# Patient Record
Sex: Female | Born: 1960
Health system: Southern US, Community
[De-identification: ages and names within clinical notes are randomized; demographics above are authoritative.]

## PROBLEM LIST (undated history)

## (undated) DIAGNOSIS — Z720 Tobacco use: Secondary | ICD-10-CM

## (undated) DIAGNOSIS — F329 Major depressive disorder, single episode, unspecified: Secondary | ICD-10-CM

## (undated) DIAGNOSIS — M51369 Other intervertebral disc degeneration, lumbar region without mention of lumbar back pain or lower extremity pain: Secondary | ICD-10-CM

## (undated) DIAGNOSIS — E079 Disorder of thyroid, unspecified: Secondary | ICD-10-CM

## (undated) DIAGNOSIS — F32A Depression, unspecified: Secondary | ICD-10-CM

## (undated) DIAGNOSIS — G4700 Insomnia, unspecified: Secondary | ICD-10-CM

## (undated) DIAGNOSIS — M797 Fibromyalgia: Secondary | ICD-10-CM

## (undated) DIAGNOSIS — M5136 Other intervertebral disc degeneration, lumbar region: Secondary | ICD-10-CM

## (undated) DIAGNOSIS — G43909 Migraine, unspecified, not intractable, without status migrainosus: Secondary | ICD-10-CM

## (undated) HISTORY — DX: Insomnia, unspecified: G47.00

## (undated) HISTORY — DX: Other intervertebral disc degeneration, lumbar region without mention of lumbar back pain or lower extremity pain: M51.369

## (undated) HISTORY — DX: Tobacco use: Z72.0

## (undated) HISTORY — DX: Disorder of thyroid, unspecified: E07.9

## (undated) HISTORY — DX: Other intervertebral disc degeneration, lumbar region: M51.36

## (undated) HISTORY — DX: Major depressive disorder, single episode, unspecified: F32.9

## (undated) HISTORY — DX: Fibromyalgia: M79.7

## (undated) HISTORY — DX: Depression, unspecified: F32.A

## (undated) HISTORY — DX: Migraine, unspecified, not intractable, without status migrainosus: G43.909

## (undated) HISTORY — PX: ABDOMINAL HYSTERECTOMY: SHX81

---

## 2009-12-29 ENCOUNTER — Ambulatory Visit: Payer: Self-pay | Admitting: Family Medicine

## 2009-12-29 DIAGNOSIS — IMO0002 Reserved for concepts with insufficient information to code with codable children: Secondary | ICD-10-CM | POA: Insufficient documentation

## 2009-12-29 DIAGNOSIS — M1710 Unilateral primary osteoarthritis, unspecified knee: Secondary | ICD-10-CM

## 2009-12-29 DIAGNOSIS — M5137 Other intervertebral disc degeneration, lumbosacral region: Secondary | ICD-10-CM | POA: Insufficient documentation

## 2009-12-29 DIAGNOSIS — N951 Menopausal and female climacteric states: Secondary | ICD-10-CM | POA: Insufficient documentation

## 2009-12-29 DIAGNOSIS — F4321 Adjustment disorder with depressed mood: Secondary | ICD-10-CM | POA: Insufficient documentation

## 2009-12-29 DIAGNOSIS — E039 Hypothyroidism, unspecified: Secondary | ICD-10-CM | POA: Insufficient documentation

## 2010-01-01 ENCOUNTER — Encounter: Payer: Self-pay | Admitting: Family Medicine

## 2010-01-01 ENCOUNTER — Telehealth: Payer: Self-pay | Admitting: Family Medicine

## 2010-01-01 LAB — CONVERTED CEMR LAB
LH: 63.6 milliintl units/mL
TSH: 6.307 microintl units/mL — ABNORMAL HIGH (ref 0.350–4.500)

## 2010-01-02 LAB — CONVERTED CEMR LAB: Free T4: 0.83 ng/dL (ref 0.80–1.80)

## 2010-01-18 ENCOUNTER — Telehealth (INDEPENDENT_AMBULATORY_CARE_PROVIDER_SITE_OTHER): Payer: Self-pay | Admitting: *Deleted

## 2010-02-06 ENCOUNTER — Ambulatory Visit: Payer: Self-pay | Admitting: Family Medicine

## 2010-02-06 DIAGNOSIS — F32A Depression, unspecified: Secondary | ICD-10-CM | POA: Insufficient documentation

## 2010-02-06 DIAGNOSIS — F329 Major depressive disorder, single episode, unspecified: Secondary | ICD-10-CM

## 2010-02-07 ENCOUNTER — Telehealth: Payer: Self-pay | Admitting: Family Medicine

## 2010-02-15 ENCOUNTER — Telehealth: Payer: Self-pay | Admitting: Family Medicine

## 2010-02-19 ENCOUNTER — Encounter: Payer: Self-pay | Admitting: Family Medicine

## 2010-05-02 ENCOUNTER — Ambulatory Visit: Payer: Self-pay | Admitting: Family Medicine

## 2010-05-02 DIAGNOSIS — R112 Nausea with vomiting, unspecified: Secondary | ICD-10-CM | POA: Insufficient documentation

## 2010-05-04 ENCOUNTER — Ambulatory Visit: Payer: Self-pay | Admitting: Family Medicine

## 2010-05-07 LAB — CONVERTED CEMR LAB
ALT: 13 units/L (ref 0–35)
AST: 14 units/L (ref 0–37)
Alkaline Phosphatase: 59 units/L (ref 39–117)
BUN: 10 mg/dL (ref 6–23)
Basophils Absolute: 0 10*3/uL (ref 0.0–0.1)
Basophils Relative: 0 % (ref 0–1)
Creatinine, Ser: 0.77 mg/dL (ref 0.40–1.20)
Eosinophils Absolute: 0.2 10*3/uL (ref 0.0–0.7)
HDL: 54 mg/dL (ref >39)
Hemoglobin: 15.3 g/dL — ABNORMAL HIGH (ref 12.0–15.0)
LDL Cholesterol: 128 mg/dL — ABNORMAL HIGH (ref 0–99)
Lipase: 22 units/L (ref 0–75)
MCHC: 34.5 g/dL (ref 30.0–36.0)
MCV: 93.5 fL (ref 78.0–100.0)
Monocytes Absolute: 0.7 10*3/uL (ref 0.1–1.0)
Monocytes Relative: 7 % (ref 3–12)
Neutro Abs: 5.9 10*3/uL (ref 1.7–7.7)
Neutrophils Relative %: 63 % (ref 43–77)
RBC: 4.74 M/uL (ref 3.87–5.11)
RDW: 13.2 % (ref 11.5–15.5)
TSH: 4.121 microintl units/mL (ref 0.350–4.500)
Total Bilirubin: 0.5 mg/dL (ref 0.3–1.2)
Total CHOL/HDL Ratio: 3.7
VLDL: 17 mg/dL (ref 0–40)

## 2010-05-14 ENCOUNTER — Telehealth (INDEPENDENT_AMBULATORY_CARE_PROVIDER_SITE_OTHER): Payer: Self-pay | Admitting: *Deleted

## 2010-05-30 ENCOUNTER — Telehealth: Payer: Self-pay | Admitting: Family Medicine

## 2010-06-13 ENCOUNTER — Ambulatory Visit: Payer: Self-pay | Admitting: Family Medicine

## 2010-06-13 DIAGNOSIS — F17200 Nicotine dependence, unspecified, uncomplicated: Secondary | ICD-10-CM | POA: Insufficient documentation

## 2010-06-27 ENCOUNTER — Telehealth: Payer: Self-pay | Admitting: Family Medicine

## 2010-10-30 NOTE — Letter (Signed)
Summary: [facility]  [facility]   Imported By: Lanelle Bal 03/05/2010 09:39:37  _____________________________________________________________________  External Attachment:    Type:   Image     Comment:   External Document

## 2010-10-30 NOTE — Progress Notes (Signed)
Summary: Knee hurting- wants injection  Phone Note Call from Patient Call back at Home Phone [phone]   Caller: Patient Call For: Seymour Bars DO Summary of Call: Pt calls and wants to know if it is to soon to get another injection in her left knee- states it is killing her Initial call taken by: Kathlene November LPN,  June 27, 2010 8:26 AM  Follow-up for Phone Call        It's only been 1 month since last injection, so too early to repeat.  I suggest she call Dr Shella Spearing for f/u with him ([facility]) Follow-up by: Seymour Bars DO,  June 27, 2010 9:24 AM  Additional Follow-up for Phone Call Additional follow up Details #1::        Pt notified of above instructions. She has called her ortho and awaiting a call back from them Additional Follow-up by: Kathlene November LPN,  June 27, 2010 9:31 AM

## 2010-10-30 NOTE — Progress Notes (Signed)
Summary: Feeling better  Phone Note Call from Patient   Caller: Patient Summary of Call: Pt called to thank you for making her feel better. Pt states since increasing thyroid dose she has felt like a new person and depression is better.  Initial call taken by: Payton Spark CMA,  May 14, 2010 11:03 AM

## 2010-10-30 NOTE — Progress Notes (Signed)
Summary: Knee pain referral  Phone Note Call from Patient   Caller: Patient Summary of Call: Pt would like referral to orthopaedic surgeon Dr. Randall An @ [facility] for her knees. Please advise Initial call taken by: Payton Spark CMA,  Feb 15, 2010 12:45 PM  Follow-up for Phone Call        done. Follow-up by: Seymour Bars DO,  Feb 15, 2010 1:03 PM

## 2010-10-30 NOTE — Assessment & Plan Note (Signed)
Summary: back and knee pain   Vital Signs:  Patient profile:   50 year old female Height:      70 inches Weight:      206 pounds BMI:     29.66 O2 Sat:      98 % on Room air Pulse rate:   99 / minute BP sitting:   120 / 70  (left arm) Cuff size:   large  Vitals Entered By: Payton Spark CMA (Feb 06, 2010 1:23 PM)  O2 Flow:  Room air CC: F/U back and joint pain.    Primary Care Provider:  Seymour Bars DO  CC:  F/U back and joint pain. Marland Kitchen  History of Present Illness: I injected her L knee with a steroid injection 4-1.  It did help short term but it is starting to hurt more again.  She has advanced DJD.  It hurts when she stands all day at her part time job at the [employer].  She still c/o having LBP that hurts all the time.  She had imaging done- MRI in the last year.  She has been seeing Dr Roderic Ovens for pain mgmt for injections which have helped but her pain is definitely worsened by working (standing).  She is back in school for medical coding.  She finished in June and thinks that her pain will improve once she has a more sedentary job.        Current Medications (verified): 1)  Amitriptyline Hcl 50 Mg Tabs (Amitriptyline Hcl) .... Take 1-2 Tabs By Mouth At Bedtime 2)  Doxepin Hcl 10 Mg Caps (Doxepin Hcl) .... Take 1 Cap By Mouth Before Meals 3)  Zanaflex 4 Mg Tabs (Tizanidine Hcl) .... Take 1 Tab Three Times A Day By Mouth As Needed 4)  Pramipexole Dihydrochloride 1 Mg Tabs (Pramipexole Dihydrochloride) .... Take 1 Tab By Mouth Three Times A Day 5)  Gabapentin 600 Mg Tabs (Gabapentin) .... Take 1 Tab By Mouth Three Times A Day 6)  Alprazolam 0.5 Mg Tabs (Alprazolam) .... Take 1 Tab By Mouth Two Times A Day As Needed 7)  Amphetamine-Dextroamphetamine 5 Mg Tabs (Amphetamine-Dextroamphetamine) .... Take 1 Tab By Mouth Once Daily 8)  Tramadol Hcl 50 Mg Tabs (Tramadol Hcl) .... Take 1 Tab By Mouth Every 6-8 Hours As Needed 9)  Metoclopramide Hcl 10 Mg Tabs (Metoclopramide Hcl) .... Take 1  Tab By Mouth Three Times A Day Before Meals 10)  Nexium 40 Mg Cpdr (Esomeprazole Magnesium) .... Take 1 Tab By Mouth Once Daily 11)  Savella 50 Mg Tabs (Milnacipran Hcl) .... Take 1 Tab By Mouth Two Times A Day 12)  Meloxicam 7.5 Mg Tabs (Meloxicam) .Marland Kitchen.. 1-2 Tabs By Mouth Once Daily As Needed For Pain 13)  Valtrex 500 Mg Tabs (Valacyclovir Hcl) .... As Needed Fever Blisters 14)  Synthroid 50 Mcg Tabs (Levothyroxine Sodium) .Marland Kitchen.. 1 Tab By Mouth Daily As Directed  Allergies (verified): 1)  ! Lyrica 2)  ! Levothroid (Levothyroxine Sodium)  Past History:  Past Medical History: Reviewed history from 12/29/2009 and no changes required. fibromyalgia lumbar ddd hypothyroidism depression L knee DJD migraines nausea, chronic GERD Obesity   [facility] podiatry Dr Roderic Ovens pain mgmt  Past Surgical History: Reviewed history from 12/29/2009 and no changes required. TAH w/o BSO  Family History: Reviewed history from 12/29/2009 and no changes required. 4 sibblings mother depression, high chol, HTN father died of AMI at 72 brother ETOH sister died with T1DM brothers, sister HTN  Social History: Reviewed history from  12/29/2009 and no changes required. disabled for Lumbar DDD Divorced.  no relationships. back in school for medical coding. Has a 73 yo son (who is an RT) in [place]. Lives with her mom. Smokes 1/4 ppd. Exericses 2 x a wk. goes to [employer].  Review of Systems      See HPI  Physical Exam  General:  obese WF in NAD Mouth:  pharynx pink and moist.   Neck:  no masses.   Lungs:  Normal respiratory effort, chest expands symmetrically. Lungs are clear to auscultation, no crackles or wheezes. Heart:  RRR w/o M Msk:  no joint swelling, no joint warmth, and no redness over joints.   Pulses:  2+ radial pulses Extremities:  no E/C/C Skin:  color normal.   Cervical Nodes:  No lymphadenopathy noted Psych:  depressed affect and poor eye contact.     Impression &  Recommendations:  Problem # 1:  DISC DISEASE, LUMBAR (ICD-722.52) She is managed by Dr Roderic Ovens, on a large # of different meds which I reviewed today.  I really have nothing else to add other than increasing her pilates and adding some yoga and walking.  She can try thermacare heat wraps when she is working at [employer] and needing to stand.    We discussed more of a long term plan with her job options in the future.  Problem # 2:  DEGENERATIVE JOINT DISEASE, LEFT KNEE (ICD-715.96) Still having L knee pain after corticosteroid injection that helped short term last visit.  She is free to f/u with her orthopedist to discuss synvisc vs surgery. Her updated medication list for this problem includes:    Zanaflex 4 Mg Tabs (Tizanidine hcl) .Marland Kitchen... Take 1 tab three times a day by mouth as needed    Tramadol Hcl 50 Mg Tabs (Tramadol hcl) .Marland Kitchen... Take 1 tab by mouth every 6-8 hours as needed    Meloxicam 7.5 Mg Tabs (Meloxicam) .Marland Kitchen... 1-2 tabs by mouth once daily as needed for pain  Problem # 3:  UNSPECIFIED HYPOTHYROIDISM (ICD-244.9) New start of Synthroid has helped her energy level.  REcheck her TSH in 3 wks. Her updated medication list for this problem includes:    Synthroid 50 Mcg Tabs (Levothyroxine sodium) .Marland Kitchen... 1 tab by mouth daily as directed  Orders: T-TSH ([id number])  Labs Reviewed: TSH: 6.307 (12/29/2009)     Problem # 4:  DEPRESSION, MILD (ICD-311) She is maxed out on her current meds.  I woiuld recommend a visit to psych to manage her fibromyalgia and depression if not improving.   Her updated medication list for this problem includes:    Amitriptyline Hcl 50 Mg Tabs (Amitriptyline hcl) .Marland Kitchen... Take 1-2 tabs by mouth at bedtime    Doxepin Hcl 10 Mg Caps (Doxepin hcl) .Marland Kitchen... Take 1 cap by mouth before meals    Alprazolam 0.5 Mg Tabs (Alprazolam) .Marland Kitchen... Take 1 tab by mouth two times a day as needed  Complete Medication List: 1)  Amitriptyline Hcl 50 Mg Tabs (Amitriptyline hcl) .... Take 1-2  tabs by mouth at bedtime 2)  Doxepin Hcl 10 Mg Caps (Doxepin hcl) .... Take 1 cap by mouth before meals 3)  Zanaflex 4 Mg Tabs (Tizanidine hcl) .... Take 1 tab three times a day by mouth as needed 4)  Pramipexole Dihydrochloride 1 Mg Tabs (Pramipexole dihydrochloride) .... Take 1 tab by mouth three times a day 5)  Gabapentin 600 Mg Tabs (Gabapentin) .... Take 1 tab by mouth three times a day 6)  Alprazolam 0.5 Mg Tabs (Alprazolam) .... Take 1 tab by mouth two times a day as needed 7)  Amphetamine-dextroamphetamine 5 Mg Tabs (Amphetamine-dextroamphetamine) .... Take 1 tab by mouth once daily 8)  Tramadol Hcl 50 Mg Tabs (Tramadol hcl) .... Take 1 tab by mouth every 6-8 hours as needed 9)  Metoclopramide Hcl 10 Mg Tabs (Metoclopramide hcl) .... Take 1 tab by mouth three times a day before meals 10)  Nexium 40 Mg Cpdr (Esomeprazole magnesium) .... Take 1 tab by mouth once daily 11)  Savella 50 Mg Tabs (Milnacipran hcl) .... Take 1 tab by mouth two times a day 12)  Meloxicam 7.5 Mg Tabs (Meloxicam) .Marland Kitchen.. 1-2 tabs by mouth once daily as needed for pain 13)  Valtrex 500 Mg Tabs (Valacyclovir hcl) .... As needed fever blisters 14)  Synthroid 50 Mcg Tabs (Levothyroxine sodium) .Marland Kitchen.. 1 tab by mouth daily as directed  Patient Instructions: 1)  Return for CPE with fasting labs and mammogram in 4 mos.

## 2010-10-30 NOTE — Progress Notes (Signed)
Summary: Lidoderm refill  Phone Note Refill Request   Refills Requested: Medication #1:  LIDODERM 5 % PTCH. Pt would like to know if you will refill this for her.   Initial call taken by: Payton Spark CMA,  May 30, 2010 4:34 PM    New/Updated Medications: LIDODERM 5 % PTCH (LIDOCAINE) apply patch for up to 12 hrs/ day Prescriptions: LIDODERM 5 % PTCH (LIDOCAINE) apply patch for up to 12 hrs/ day  #30 patch x 2   Entered and Authorized by:   Seymour Bars DO   Signed by:   Seymour Bars DO on 05/30/2010   Method used:   Electronically to        [facility]* (retail)       [address]       [place], [place]  [address]       Ph: [phone]       Fax: [phone]   RxID:   [id number]

## 2010-10-30 NOTE — Assessment & Plan Note (Signed)
Summary: f/u mood/ hot flashes   Vital Signs:  Patient profile:   50 year old female Height:      70 inches Weight:      202 pounds BMI:     29.09 O2 Sat:      97 % on Room air Pulse rate:   97 / minute BP sitting:   116 / 72  (left arm) Cuff size:   large  Vitals Entered By: Payton Spark CMA (June 13, 2010 2:01 PM)  O2 Flow:  Room air CC: F/U. Always exhausted   Primary Care Provider:  Seymour Bars DO  CC:  F/U. Always exhausted.  History of Present Illness: 50 yo WF presents for menopausal symptoms.  She started having hot flashes and nightsweats this Spring.  She has had a TAH w/o BSO in the past.  She really would like to start HRT.  Denies fam hx of premenopausal breast or ovarian cancer.  She has never had a blood clot, AMI or CVA.  She does still smoke 1/4 ppd and has cut back since she has gone back to work.  Has not tried to quit in the past.  Denies any aggitation or severe craving since cutting back.  She started walking and her mood has improved some after getting over the death of her sister.  She would like to lose weight for her neice's wedding.  She is still seeing her psychiatrist in [place].  Not doing counseling.      Allergies: 1)  ! Lyrica 2)  ! Levothroid (Levothyroxine Sodium)  Past History:  Past Medical History: Reviewed history from 05/02/2010 and no changes required. fibromyalgia lumbar ddd hypothyroidism depression L knee DJD migraines nausea, chronic GERD Obesity IBS   [facility] podiatry Dr Roderic Ovens pain mgmt [facility] [place]  Social History: Reviewed history from 12/29/2009 and no changes required. disabled for Lumbar DDD Divorced.  no relationships. back in school for medical coding. Has a 60 yo son (who is an RT) in [place]. Lives with her mom. Smokes 1/4 ppd. Exericses 2 x a wk. goes to [employer].  Review of Systems      See HPI  Physical Exam  General:  obese WF in NAD Mouth:  pharynx pink and moist.   Neck:  no masses.   Lungs:   Normal respiratory effort, chest expands symmetrically. Lungs are clear to auscultation, no crackles or wheezes. Heart:  Normal rate and regular rhythm. S1 and S2 normal without gallop, murmur, click, rub or other extra sounds. Extremities:  no LE edema Psych:  good eye contact, not anxious appearing, and depressed affect.     Impression & Recommendations:  Problem # 1:  DEPRESSION, MILD (ICD-311) She is to return to see her psychiatrist, has appt in 2 wks for f/u.  I do think that her stress and depression with mulitple meds and lack of exercise is the big source for her fatigue as well as caretaker stress from her mom who is aging with dementia.  She is to continue on her current meds and will consider adding counseling.  She thinks that her mood has actually improved since starting to work part time. Her updated medication list for this problem includes:    Amitriptyline Hcl 50 Mg Tabs (Amitriptyline hcl) .Marland Kitchen... Take 1-2 tabs by mouth at bedtime    Doxepin Hcl 10 Mg Caps (Doxepin hcl) .Marland Kitchen... Take 1 cap by mouth before meals    Alprazolam 0.5 Mg Tabs (Alprazolam) .Marland Kitchen... Take 1 tab by mouth  two times a day as needed  Orders: Psychiatric Referral (Psych)  Problem # 2:  HOT FLASHES (ICD-627.2) She is going thru menopausal symptoms.  her FSH and LH were in the menopausal range at last check. She is not currently a candidate for HRT because she is a smoker.  She really has no other risk factors, so if she quits smoking, this may be an option for her.  She is already on a TCA.   Wt loss and regular exercise would also be helpful.  Problem # 3:  UNSPECIFIED HYPOTHYROIDISM (ICD-244.9) Increased her Synthroid dose which has helped some of her constipation issues.  She is due to recheck in 4-6 wks. Her updated medication list for this problem includes:    Synthroid 75 Mcg Tabs (Levothyroxine sodium) .Marland Kitchen... 1 tab by mouth daily  Labs Reviewed: TSH: 4.121 (05/04/2010)    Chol: 199 (05/04/2010)    HDL: 54 (05/04/2010)   LDL: 128 (05/04/2010)   TG: 85 (05/04/2010)  Problem # 4:  TOBACCO ABUSE (ICD-305.1) Continuing to smoke  ~ 1/4 ppd and has cut back since starting her new job. She feels motivated to quit esp with the option of adding HRT after quitting. Way to quit pack given to pt today and counseled face to face 10 min on smoking cessation.  Complete Medication List: 1)  Amitriptyline Hcl 50 Mg Tabs (Amitriptyline hcl) .... Take 1-2 tabs by mouth at bedtime 2)  Doxepin Hcl 10 Mg Caps (Doxepin hcl) .... Take 1 cap by mouth before meals 3)  Zanaflex 4 Mg Tabs (Tizanidine hcl) .... Take 1 tab three times a day by mouth as needed 4)  Pramipexole Dihydrochloride 1 Mg Tabs (Pramipexole dihydrochloride) .... Take 1 tab by mouth three times a day 5)  Gabapentin 600 Mg Tabs (Gabapentin) .... Take 1 tab by mouth three times a day 6)  Alprazolam 0.5 Mg Tabs (Alprazolam) .... Take 1 tab by mouth two times a day as needed 7)  Amphetamine-dextroamphetamine 5 Mg Tabs (Amphetamine-dextroamphetamine) .... Take 1 tab by mouth once daily 8)  Tramadol Hcl 50 Mg Tabs (Tramadol hcl) .... Take 1 tab by mouth every 6-8 hours as needed 9)  Metoclopramide Hcl 10 Mg Tabs (Metoclopramide hcl) .... Take 1 tab by mouth three times a day before meals 10)  Nexium 40 Mg Cpdr (Esomeprazole magnesium) .... Take 1 tab by mouth once daily 11)  Savella 50 Mg Tabs (Milnacipran hcl) .... Take 1 tab by mouth two times a day 12)  Meloxicam 7.5 Mg Tabs (Meloxicam) .Marland Kitchen.. 1-2 tabs by mouth once daily as needed for pain 13)  Valtrex 500 Mg Tabs (Valacyclovir hcl) .... As needed fever blisters 14)  Synthroid 75 Mcg Tabs (Levothyroxine sodium) .Marland Kitchen.. 1 tab by mouth daily 15)  Amitiza  16)  Lidoderm 5 % Ptch (Lidocaine) .... Apply patch for up to 12 hrs/ day  Other Orders: Flu Vaccine 38yrs + MEDICARE PATIENTS (V4098) Administration Flu vaccine - MCR (J1914)  Patient Instructions: 1)  Work on smoking cessation. 2)  You are free  to contact [facility] re: bioidentical hormones/ testing. 3)  Recheck Thyroid labs in 6 wks. 4)  f/u with your psychiatrist. 5)  Work on adding exercise to your schedule. 6)  Return for f/u smoking cessation/ fatigue in 2 mos. Flu Vaccine Consent Questions     Do you have a history of severe allergic reactions to this vaccine? no    Any prior history of allergic reactions to egg and/or gelatin?  no    Do you have a sensitivity to the preservative Thimersol? no    Do you have a past history of Guillan-Barre Syndrome? no    Do you currently have an acute febrile illness? no    Have you ever had a severe reaction to latex? no    Vaccine information given and explained to patient? yes    Are you currently pregnant? no    Lot Number:[id number]   Exp Date:03/30/2011   Site Given  Left Deltoid IMbmedflu

## 2010-10-30 NOTE — Progress Notes (Signed)
Summary: Rx  Phone Note Call from Patient   Caller: Patient Summary of Call: Dr.Bowen       Patient said she need a Rx for Synthroid 50 mg called in to the pharmacy.[facility] [phone] Initial call taken by: Vanessa [place],  January 18, 2010 10:05 AM  Follow-up for Phone Call        Already sent to pharm Follow-up by: Payton Spark CMA,  January 18, 2010 11:37 AM     Appended Document: Rx Called in again bc Pt states pharm didnt receive it.

## 2010-10-30 NOTE — Assessment & Plan Note (Signed)
Summary: f/u meds   Vital Signs:  Patient profile:   50 year old female Height:      70 inches Weight:      203 pounds BMI:     29.23 O2 Sat:      99 % on Room air Pulse rate:   95 / minute BP sitting:   144 / 74  (left arm) Cuff size:   large  Vitals Entered By: Payton Spark CMA (May 02, 2010 3:03 PM)  O2 Flow:  Room air CC: Med f/u. Requests labs for thyroid   Primary Care Kolbee Bogusz:  Seymour Bars DO  CC:  Med f/u. Requests labs for thyroid.  History of Present Illness: 50 yo WF presents for f/u hypothyroidism.  She is due for fasting labs and TSH -- she is only on 25 micrograms of Synthroid.  She is seeing Dr Edmund Hilda for her depression and ADD.  She is off her Adderall.  She has seen GI for a w/u of vomitting in the past.  She has been having N/V.    She is having swelling on the 25 micrograms dose of synthroid. She is having trouble focusing.  She does not have f/u appt with her psychiatrist.  She is not compliant with all of her meds due to her nausea.    Current Medications (verified): 1)  Amitriptyline Hcl 50 Mg Tabs (Amitriptyline Hcl) .... Take 1-2 Tabs By Mouth At Bedtime 2)  Doxepin Hcl 10 Mg Caps (Doxepin Hcl) .... Take 1 Cap By Mouth Before Meals 3)  Zanaflex 4 Mg Tabs (Tizanidine Hcl) .... Take 1 Tab Three Times A Day By Mouth As Needed 4)  Pramipexole Dihydrochloride 1 Mg Tabs (Pramipexole Dihydrochloride) .... Take 1 Tab By Mouth Three Times A Day 5)  Gabapentin 600 Mg Tabs (Gabapentin) .... Take 1 Tab By Mouth Three Times A Day 6)  Alprazolam 0.5 Mg Tabs (Alprazolam) .... Take 1 Tab By Mouth Two Times A Day As Needed 7)  Amphetamine-Dextroamphetamine 5 Mg Tabs (Amphetamine-Dextroamphetamine) .... Take 1 Tab By Mouth Once Daily 8)  Tramadol Hcl 50 Mg Tabs (Tramadol Hcl) .... Take 1 Tab By Mouth Every 6-8 Hours As Needed 9)  Metoclopramide Hcl 10 Mg Tabs (Metoclopramide Hcl) .... Take 1 Tab By Mouth Three Times A Day Before Meals 10)  Nexium 40 Mg Cpdr  (Esomeprazole Magnesium) .... Take 1 Tab By Mouth Once Daily 11)  Savella 50 Mg Tabs (Milnacipran Hcl) .... Take 1 Tab By Mouth Two Times A Day 12)  Meloxicam 7.5 Mg Tabs (Meloxicam) .Marland Kitchen.. 1-2 Tabs By Mouth Once Daily As Needed For Pain 13)  Valtrex 500 Mg Tabs (Valacyclovir Hcl) .... As Needed Fever Blisters 14)  Synthroid 50 Mcg Tabs (Levothyroxine Sodium) .Marland Kitchen.. 1 Tab By Mouth Daily As Directed 15)  Amitiza 16)  Lidoderm 5 % Ptch (Lidocaine)  Allergies (verified): 1)  ! Lyrica 2)  ! Levothroid (Levothyroxine Sodium)  Past History:  Past Medical History: fibromyalgia lumbar ddd hypothyroidism depression L knee DJD migraines nausea, chronic GERD Obesity IBS   [facility] podiatry Dr Roderic Ovens pain mgmt [facility]  Past Surgical History: Reviewed history from 12/29/2009 and no changes required. TAH w/o BSO  Family History: Reviewed history from 12/29/2009 and no changes required. 4 sibblings mother depression, high chol, HTN father died of AMI at 73 brother ETOH sister died with T1DM brothers, sister HTN  Social History: Reviewed history from 12/29/2009 and no changes required. disabled for Lumbar DDD Divorced.  no relationships. back  in school for medical coding. Has a 93 yo son (who is an RT) in [place]. Lives with her mom. Smokes 1/4 ppd. Exericses 2 x a wk. goes to [employer].  Review of Systems      See HPI  Physical Exam  General:  alert, well-developed, well-nourished, well-hydrated, and overweight-appearing.   Head:  normocephalic and atraumatic.   Mouth:  pharynx pink and moist.   Neck:  no masses.   Lungs:  Normal respiratory effort, chest expands symmetrically. Lungs are clear to auscultation, no crackles or wheezes. Heart:  RRR w/o M Abdomen:  slight epigastric TTP, soft.  no guarding Extremities:  no LE edema Skin:  color normal.   Psych:  good eye contact and depressed affect.     Impression & Recommendations:  Problem # 1:  DEPRESSION, MILD  (ICD-311) She needs to f/u with psychiatry in [place] and then I will refer her to psych downstairs -- this is a more convient location for her.    Her updated medication list for this problem includes:    Amitriptyline Hcl 50 Mg Tabs (Amitriptyline hcl) .Marland Kitchen... Take 1-2 tabs by mouth at bedtime    Doxepin Hcl 10 Mg Caps (Doxepin hcl) .Marland Kitchen... Take 1 cap by mouth before meals    Alprazolam 0.5 Mg Tabs (Alprazolam) .Marland Kitchen... Take 1 tab by mouth two times a day as needed  Problem # 2:  NAUSEA AND VOMITING (ICD-787.01) Chronic.  Cyclic N/V.  Update labs and will call her with results. May need to get her back in with GI.  Bland diet with use of Reglan, Doxepin.   Orders: T-CBC w/Diff ([id number]) T-Comprehensive Metabolic Panel [phone]) T-Lipase [phone])  Problem # 3:  UNSPECIFIED HYPOTHYROIDISM (ICD-244.9)  Her updated medication list for this problem includes:    Synthroid 50 Mcg Tabs (Levothyroxine sodium) .Marland Kitchen... 1 tab by mouth daily as directed  Orders: T-T4, Free [phone]) T-TSH [phone])  Labs Reviewed: TSH: 6.307 (12/29/2009)     Complete Medication List: 1)  Amitriptyline Hcl 50 Mg Tabs (Amitriptyline hcl) .... Take 1-2 tabs by mouth at bedtime 2)  Doxepin Hcl 10 Mg Caps (Doxepin hcl) .... Take 1 cap by mouth before meals 3)  Zanaflex 4 Mg Tabs (Tizanidine hcl) .... Take 1 tab three times a day by mouth as needed 4)  Pramipexole Dihydrochloride 1 Mg Tabs (Pramipexole dihydrochloride) .... Take 1 tab by mouth three times a day 5)  Gabapentin 600 Mg Tabs (Gabapentin) .... Take 1 tab by mouth three times a day 6)  Alprazolam 0.5 Mg Tabs (Alprazolam) .... Take 1 tab by mouth two times a day as needed 7)  Amphetamine-dextroamphetamine 5 Mg Tabs (Amphetamine-dextroamphetamine) .... Take 1 tab by mouth once daily 8)  Tramadol Hcl 50 Mg Tabs (Tramadol hcl) .... Take 1 tab by mouth every 6-8 hours as needed 9)  Metoclopramide Hcl 10 Mg Tabs (Metoclopramide hcl) .... Take 1 tab by  mouth three times a day before meals 10)  Nexium 40 Mg Cpdr (Esomeprazole magnesium) .... Take 1 tab by mouth once daily 11)  Savella 50 Mg Tabs (Milnacipran hcl) .... Take 1 tab by mouth two times a day 12)  Meloxicam 7.5 Mg Tabs (Meloxicam) .Marland Kitchen.. 1-2 tabs by mouth once daily as needed for pain 13)  Valtrex 500 Mg Tabs (Valacyclovir hcl) .... As needed fever blisters 14)  Synthroid 50 Mcg Tabs (Levothyroxine sodium) .Marland Kitchen.. 1 tab by mouth daily as directed 15)  Amitiza  16)  Lidoderm 5 % Ptch (Lidocaine)  Other Orders: T-Lipid Profile ([id number])  Patient Instructions: 1)  Update fasting labs. 2)  Will call you w/ results. 3)  Will adjust Synthroid accordingly. 4)  Clear liquid, bland diet. 5)  May need to get back in with GI. 6)  F/U with psychiatrist. 7)  REturn for follow up in 3 mos.

## 2010-10-30 NOTE — Progress Notes (Signed)
Summary: Labs and hotflashes  Phone Note Call from Patient   Caller: Patient Summary of Call: Pt requests lab results and also would like to know if there is anything she can take for hot flashes. Hot flashes are causing pain in legs and feet to be worse. Please advise. Initial call taken by: Payton Spark CMA,  January 01, 2010 1:06 PM  Follow-up for Phone Call        Pls remind pt that the Meloxicam RX replaces her Celebrex -- both are NSAIDs. Have her try OTC evening primrose oil in the evenings for hot flashes.     Follow-up by: Seymour Bars DO,  January 01, 2010 1:09 PM     Appended Document: Labs and hotflashes Pt aware

## 2010-10-30 NOTE — Assessment & Plan Note (Signed)
Summary: knee injection   Vital Signs:  Patient profile:   50 year old female Height:      70 inches Weight:      203 pounds BMI:     29.23 O2 Sat:      97 % on Room air Pulse rate:   99 / minute BP sitting:   112 / 70  (left arm) Cuff size:   large  Vitals Entered By: Payton Spark CMA (May 04, 2010 3:12 PM)  O2 Flow:  Room air CC: L knee injection.   Primary Care Provider:  Seymour Bars DO  CC:  L knee injection.Marland Kitchen  History of Present Illness: 50 yo WF presents for f/u L knee pain.  She has DJD.  She had a steroid injection in April and it helped for short term but the pain came back.  She saw ortho back in June but they wanted her to have Synvisc which she cannot afford now.  he did not talk to her about surgery.  She has pain with flexion and pain with sleeping.    Meloxicam helps a little bit.  Denies swelling or redness.    Current Medications (verified): 1)  Amitriptyline Hcl 50 Mg Tabs (Amitriptyline Hcl) .... Take 1-2 Tabs By Mouth At Bedtime 2)  Doxepin Hcl 10 Mg Caps (Doxepin Hcl) .... Take 1 Cap By Mouth Before Meals 3)  Zanaflex 4 Mg Tabs (Tizanidine Hcl) .... Take 1 Tab Three Times A Day By Mouth As Needed 4)  Pramipexole Dihydrochloride 1 Mg Tabs (Pramipexole Dihydrochloride) .... Take 1 Tab By Mouth Three Times A Day 5)  Gabapentin 600 Mg Tabs (Gabapentin) .... Take 1 Tab By Mouth Three Times A Day 6)  Alprazolam 0.5 Mg Tabs (Alprazolam) .... Take 1 Tab By Mouth Two Times A Day As Needed 7)  Amphetamine-Dextroamphetamine 5 Mg Tabs (Amphetamine-Dextroamphetamine) .... Take 1 Tab By Mouth Once Daily 8)  Tramadol Hcl 50 Mg Tabs (Tramadol Hcl) .... Take 1 Tab By Mouth Every 6-8 Hours As Needed 9)  Metoclopramide Hcl 10 Mg Tabs (Metoclopramide Hcl) .... Take 1 Tab By Mouth Three Times A Day Before Meals 10)  Nexium 40 Mg Cpdr (Esomeprazole Magnesium) .... Take 1 Tab By Mouth Once Daily 11)  Savella 50 Mg Tabs (Milnacipran Hcl) .... Take 1 Tab By Mouth Two Times  A Day 12)  Meloxicam 7.5 Mg Tabs (Meloxicam) .Marland Kitchen.. 1-2 Tabs By Mouth Once Daily As Needed For Pain 13)  Valtrex 500 Mg Tabs (Valacyclovir Hcl) .... As Needed Fever Blisters 14)  Synthroid 50 Mcg Tabs (Levothyroxine Sodium) .Marland Kitchen.. 1 Tab By Mouth Daily As Directed 15)  Amitiza 16)  Lidoderm 5 % Ptch (Lidocaine)  Allergies (verified): 1)  ! Lyrica 2)  ! Levothroid (Levothyroxine Sodium)   Complete Medication List: 1)  Amitriptyline Hcl 50 Mg Tabs (Amitriptyline hcl) .... Take 1-2 tabs by mouth at bedtime 2)  Doxepin Hcl 10 Mg Caps (Doxepin hcl) .... Take 1 cap by mouth before meals 3)  Zanaflex 4 Mg Tabs (Tizanidine hcl) .... Take 1 tab three times a day by mouth as needed 4)  Pramipexole Dihydrochloride 1 Mg Tabs (Pramipexole dihydrochloride) .... Take 1 tab by mouth three times a day 5)  Gabapentin 600 Mg Tabs (Gabapentin) .... Take 1 tab by mouth three times a day 6)  Alprazolam 0.5 Mg Tabs (Alprazolam) .... Take 1 tab by mouth two times a day as needed 7)  Amphetamine-dextroamphetamine 5 Mg Tabs (Amphetamine-dextroamphetamine) .... Take 1 tab by  mouth once daily 8)  Tramadol Hcl 50 Mg Tabs (Tramadol hcl) .... Take 1 tab by mouth every 6-8 hours as needed 9)  Metoclopramide Hcl 10 Mg Tabs (Metoclopramide hcl) .... Take 1 tab by mouth three times a day before meals 10)  Nexium 40 Mg Cpdr (Esomeprazole magnesium) .... Take 1 tab by mouth once daily 11)  Savella 50 Mg Tabs (Milnacipran hcl) .... Take 1 tab by mouth two times a day 12)  Meloxicam 7.5 Mg Tabs (Meloxicam) .Marland Kitchen.. 1-2 tabs by mouth once daily as needed for pain 13)  Valtrex 500 Mg Tabs (Valacyclovir hcl) .... As needed fever blisters 14)  Synthroid 50 Mcg Tabs (Levothyroxine sodium) .Marland Kitchen.. 1 tab by mouth daily as directed 15)  Amitiza  16)  Lidoderm 5 % Ptch (Lidocaine)  Other Orders: Joint Aspirate / Injection, Large (20610)  Procedure Note  Injections: The patient complains of pain and tenderness but denies redness, swelling,  and fever. Duration of symptoms: > 3 months Indication: chronic pain Consent signed: no  Procedure # 1: joint injection    Region: R lateral knee injection    Technique: 24 g needle    Medication: 40 mg depomedrol    Anesthesia: 1% lidocaine w/o epinephrine  Cleaned and prepped with: alcohol and betadine Additional Instructions: Ice knee on and off today and thru the weekend.  OK to remove bandiad and clean with soap and water tomororw.  Stay on NSAIDs.  Call if any problems.
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
## 2010-10-30 NOTE — Progress Notes (Signed)
Summary: PT referral   Phone Note Call from Patient   Caller: Patient Summary of Call: Pt requests PT referral to [facility] in [place].  Initial call taken by: Payton Spark CMA,  Feb 07, 2010 8:33 AM

## 2011-06-24 ENCOUNTER — Other Ambulatory Visit: Payer: Self-pay | Admitting: Family Medicine

## 2011-06-27 ENCOUNTER — Inpatient Hospital Stay: Admission: RE | Admit: 2011-06-27 | Payer: Medicare Other | Source: Ambulatory Visit | Admitting: Emergency Medicine

## 2012-10-02 ENCOUNTER — Encounter: Payer: Self-pay | Admitting: Physician Assistant

## 2012-10-02 ENCOUNTER — Ambulatory Visit (INDEPENDENT_AMBULATORY_CARE_PROVIDER_SITE_OTHER): Payer: Medicare Other | Admitting: Physician Assistant

## 2012-10-02 VITALS — BP 125/73 | HR 88 | Ht 70.0 in | Wt 236.0 lb

## 2012-10-02 DIAGNOSIS — F32A Depression, unspecified: Secondary | ICD-10-CM

## 2012-10-02 DIAGNOSIS — M797 Fibromyalgia: Secondary | ICD-10-CM

## 2012-10-02 DIAGNOSIS — E039 Hypothyroidism, unspecified: Secondary | ICD-10-CM

## 2012-10-02 DIAGNOSIS — IMO0001 Reserved for inherently not codable concepts without codable children: Secondary | ICD-10-CM

## 2012-10-02 DIAGNOSIS — F329 Major depressive disorder, single episode, unspecified: Secondary | ICD-10-CM

## 2012-10-02 DIAGNOSIS — F3289 Other specified depressive episodes: Secondary | ICD-10-CM

## 2012-10-02 DIAGNOSIS — E669 Obesity, unspecified: Secondary | ICD-10-CM

## 2012-10-02 LAB — TSH: TSH: 13.04 u[IU]/mL — ABNORMAL HIGH (ref 0.350–4.500)

## 2012-10-02 MED ORDER — AMITRIPTYLINE HCL 100 MG PO TABS: 200.0000 mg | ORAL_TABLET | Freq: Every evening | ORAL | Status: DC | PRN

## 2012-10-02 MED ORDER — LUBIPROSTONE 24 MCG PO CAPS: 24.0000 ug | ORAL_CAPSULE | Freq: Two times a day (BID) | ORAL | Status: DC

## 2012-10-02 MED ORDER — DULOXETINE HCL 60 MG PO CPEP: 60.0000 mg | ORAL_CAPSULE | Freq: Two times a day (BID) | ORAL | Status: DC

## 2012-10-02 NOTE — Patient Instructions (Addendum)
Start trying to wean off elavil start with 1 and 1/2 tab or 1 tab at night adding melatonin 3mg  and can increase to 10mg  at night.   Cymbalta add back slowly start 60mg  daily then increase if still need pain relief. Consider staying at 60mg  but if need to can increase to 90mg .   Start counting calories and try to stay under 1200 a day. Consider nutrition referral.   Start phentermine in the am. Follow up 1 month. Goal should be to lose 1lb a week.

## 2012-10-04 NOTE — Progress Notes (Addendum)
  Subjective:    Patient ID: Aimee Little, female    DOB: 1961-03-22, 52 y.o.   MRN: [id number]  HPI Patient is a 52 yo female who presents to the clinic to establish care. She has many ongoing issues and very concerned about her recent weight gain of 32 lbs over last 6 months and over 100lbs in last 3 years. She is very overwhelmed. Her mother passed away about a year ago and has been very hard on her. She has been on numerous medications in the past but recently not been able to get refilled and off of them for 2 weeks. She has not been sleeping, very depressed. She feels like thyroid might be off but had checked in November and was normal. She is changing doctors because she feels they do not listen to her concerns.    Review of Systems  Constitutional: Positive for fatigue and unexpected weight change. Negative for fever and chills.  HENT: Negative.   Cardiovascular: Negative for chest pain and palpitations.  Gastrointestinal: Negative.   Genitourinary: Negative.   Musculoskeletal: Positive for myalgias and arthralgias.  Neurological: Negative.   Psychiatric/Behavioral: Positive for dysphoric mood.       Objective:   Physical Exam  Constitutional: She is oriented to person, place, and time. She appears well-developed and well-nourished.       Obesity.  HENT:  Head: Normocephalic and atraumatic.  Neck: Neck supple. No thyromegaly present.  Cardiovascular: Normal rate, regular rhythm and normal heart sounds.   Pulmonary/Chest: Effort normal and breath sounds normal. She has no wheezes.  Neurological: She is alert and oriented to person, place, and time.  Skin: Skin is warm and dry.  Psychiatric:       Seemed frustrated and down.           Assessment & Plan:  Obesity- Discussed weight gain and how one component might be all the medications she is on. We should work to eliminate and condense some of the medications. I did also give phentermine for pt to try. Goal weight loss  should be 1 lb a week. Follow up in 1 month. Pt aware of side effects of HR increasing, BP, jitteryness. Pt also encouraged to watch her calories and keep under 1200. Consider nutrition referral.   Insomnia- STart trying to wean off elavil start with 1 and 1/2 tab or 1 tab at night adding melatonin 3mg  and can increase to 10mg  at night. Slowly decrease elavil until sleeping and at lowest dose.   Depression/fibromyaglia-On cymbalta previously.add back slowly start 60mg  daily then increase if still need pain relief. Consider staying at 60mg  but if need to can increase to 90mg . May want to consider Wellbutrin that might give you more motivation. Concern is that you will lose pain relief for fibromyaglia. At next visit can talk about other options for fibromyagia.  Hypothyroidism- Recheck TSH and will adjust accordingly. Since feeling so bad will also check Vitamin D level.

## 2012-10-05 ENCOUNTER — Other Ambulatory Visit: Payer: Self-pay | Admitting: *Deleted

## 2012-10-05 ENCOUNTER — Encounter: Payer: Self-pay | Admitting: Physician Assistant

## 2012-10-05 ENCOUNTER — Other Ambulatory Visit: Payer: Self-pay | Admitting: Physician Assistant

## 2012-10-05 MED ORDER — BUPROPION HCL ER (XL) 150 MG PO TB24: ORAL_TABLET | ORAL | Status: DC

## 2012-10-05 MED ORDER — VITAMIN D (ERGOCALCIFEROL) 1.25 MG (50000 UNIT) PO CAPS: 50000.0000 [IU] | ORAL_CAPSULE | ORAL | Status: DC

## 2012-10-05 MED ORDER — LEVOTHYROXINE SODIUM 112 MCG PO TABS: 112.0000 ug | ORAL_TABLET | Freq: Every day | ORAL | Status: DC

## 2012-10-05 NOTE — Telephone Encounter (Signed)
Pt seen Friday and did not get her phentermine rx- I looked in chart never printed. Also would like to hold off on taking the Cymbalta and start the Wellbutrin that you all had discussed. Please send both of these to pharmacy

## 2012-10-05 NOTE — Telephone Encounter (Signed)
Per pt she wants to hold off on starting the phentermine.

## 2012-10-09 ENCOUNTER — Other Ambulatory Visit: Payer: Self-pay | Admitting: Physician Assistant

## 2012-10-09 ENCOUNTER — Telehealth: Payer: Self-pay | Admitting: *Deleted

## 2012-10-09 MED ORDER — PHENTERMINE HCL 37.5 MG PO CAPS: 37.5000 mg | ORAL_CAPSULE | ORAL | Status: DC

## 2012-10-09 NOTE — Telephone Encounter (Signed)
Pharmacy calls and states they only have the tablets form for the phentermine and wanted to know if could dispense theose instead of the capsules gave the verbal order to dispense tablets.

## 2012-12-09 ENCOUNTER — Ambulatory Visit: Payer: Medicare Other | Admitting: Physician Assistant

## 2012-12-16 ENCOUNTER — Encounter: Payer: Self-pay | Admitting: Physician Assistant

## 2012-12-16 ENCOUNTER — Ambulatory Visit (INDEPENDENT_AMBULATORY_CARE_PROVIDER_SITE_OTHER): Payer: Medicare Other | Admitting: Physician Assistant

## 2012-12-16 VITALS — BP 146/81 | HR 106 | Wt 237.0 lb

## 2012-12-16 DIAGNOSIS — M1712 Unilateral primary osteoarthritis, left knee: Secondary | ICD-10-CM

## 2012-12-16 DIAGNOSIS — M1710 Unilateral primary osteoarthritis, unspecified knee: Secondary | ICD-10-CM

## 2012-12-16 DIAGNOSIS — Z79899 Other long term (current) drug therapy: Secondary | ICD-10-CM

## 2012-12-16 DIAGNOSIS — IMO0002 Reserved for concepts with insufficient information to code with codable children: Secondary | ICD-10-CM

## 2012-12-16 DIAGNOSIS — IMO0001 Reserved for inherently not codable concepts without codable children: Secondary | ICD-10-CM

## 2012-12-16 DIAGNOSIS — M797 Fibromyalgia: Secondary | ICD-10-CM

## 2012-12-16 MED ORDER — DICLOFENAC SODIUM 1 % TD GEL: 4.0000 g | Freq: Four times a day (QID) | TRANSDERMAL | Status: DC

## 2012-12-16 NOTE — Progress Notes (Signed)
  Subjective:    Patient ID: Aimee Little, female    DOB: 11-Jun-1961, 52 y.o.   MRN: [id number]  HPI Patient presents to the clinic to discuss worsening left knee pain. She has osteoarthritis and degenerative joint disease of left knee. She has not had to have injection since last August 2013. She is in a lot of pain and not able to walk unless with a limp. Taking aleve regularly. Does not like the idea of mobic and celebrex her sister had heart attack and was on those medications.   Fibromyalgia is in a flare. Everything hurts on her. She is taking her meds regularly. She feels like this weather is making worse. She has tried to exercise and not happening. She knows she needs to lose weight. Gave phentermine but wants to wait to start until she can diet, exercise and do everything right.   Review of Systems     Objective:   Physical Exam  Constitutional: She is oriented to person, place, and time. She appears well-developed and well-nourished.  HENT:  Head: Normocephalic and atraumatic.  Cardiovascular: Normal rate, regular rhythm and normal heart sounds.   Pulmonary/Chest: Effort normal and breath sounds normal.  Musculoskeletal:  Pain with palpation along the joint line of left knee. Minimal effusion supralaterally.   Walks with a limp favoring right knee.  Neurological: She is alert and oriented to person, place, and time.  Skin: Skin is warm and dry.  Psychiatric:  Very down and flat affect.          Assessment & Plan:  Left knee osteoarthritis/djd- Sent voltaren gel to try QID. Discussed supportive brace to wear all the time. Gave injection in knee.   Knee Injection Procedure Note  Pre-operative Diagnosis: left osteoarthritis/DJD of left knee  Post-operative Diagnosis: same  Indications: Symptom relief from osteoarthritis  Anesthesia: ethyl chloride   Procedure Details   Verbal consent was obtained for the procedure. The joint was prepped with Betadine and ethyl  chloride was used to numb area. A 22 gauge needle was inserted into the superior aspect of the joint from a lateral approach. 9 ml 1% lidocaine and 1 ml of depo medrol 40mg  was then injected into the joint through the same needle. The needle was removed and the area cleansed and dressed.  Complications:  None; patient tolerated the procedure well.  fibromyaglia- discussed to start cymbalta 30mg  daily for pain. This will help with pain. Aware that in combo with wellbutrin may increase concentration of cymbalta will keep at low dose. Follow up in 1 month. Pt already has wellbutrin at home. CmP was ordered for medication management to look at liver and kidneys.

## 2012-12-16 NOTE — Patient Instructions (Signed)
Start cymbalta 30mg  daily. Follow up in one month.

## 2013-01-01 ENCOUNTER — Ambulatory Visit (INDEPENDENT_AMBULATORY_CARE_PROVIDER_SITE_OTHER): Payer: Medicare Other | Admitting: Physician Assistant

## 2013-01-01 ENCOUNTER — Other Ambulatory Visit: Payer: Self-pay | Admitting: Physician Assistant

## 2013-01-01 ENCOUNTER — Encounter: Payer: Self-pay | Admitting: Physician Assistant

## 2013-01-01 VITALS — BP 135/78 | HR 92 | Wt 233.0 lb

## 2013-01-01 DIAGNOSIS — Z1211 Encounter for screening for malignant neoplasm of colon: Secondary | ICD-10-CM

## 2013-01-01 DIAGNOSIS — M797 Fibromyalgia: Secondary | ICD-10-CM | POA: Insufficient documentation

## 2013-01-01 DIAGNOSIS — IMO0001 Reserved for inherently not codable concepts without codable children: Secondary | ICD-10-CM

## 2013-01-01 DIAGNOSIS — E559 Vitamin D deficiency, unspecified: Secondary | ICD-10-CM

## 2013-01-01 DIAGNOSIS — F329 Major depressive disorder, single episode, unspecified: Secondary | ICD-10-CM

## 2013-01-01 DIAGNOSIS — Z1239 Encounter for other screening for malignant neoplasm of breast: Secondary | ICD-10-CM

## 2013-01-01 DIAGNOSIS — E039 Hypothyroidism, unspecified: Secondary | ICD-10-CM

## 2013-01-01 DIAGNOSIS — Z23 Encounter for immunization: Secondary | ICD-10-CM

## 2013-01-01 DIAGNOSIS — F3289 Other specified depressive episodes: Secondary | ICD-10-CM

## 2013-01-01 LAB — COMPLETE METABOLIC PANEL WITH GFR
BUN: 22 mg/dL (ref 6–23)
CO2: 21 mEq/L (ref 19–32)
Calcium: 8.9 mg/dL (ref 8.4–10.5)
Chloride: 109 mEq/L (ref 96–112)
Creat: 0.7 mg/dL (ref 0.50–1.10)
GFR, Est African American: >89 mL/min

## 2013-01-01 LAB — TSH: TSH: 1.324 u[IU]/mL (ref 0.350–4.500)

## 2013-01-01 MED ORDER — LEVOTHYROXINE SODIUM 112 MCG PO TABS: 112.0000 ug | ORAL_TABLET | Freq: Every day | ORAL | Status: DC

## 2013-01-01 MED ORDER — DULOXETINE HCL 30 MG PO CPEP: 30.0000 mg | ORAL_CAPSULE | Freq: Every day | ORAL | Status: DC

## 2013-01-01 MED ORDER — BUPROPION HCL ER (XL) 150 MG PO TB24: ORAL_TABLET | ORAL | Status: DC

## 2013-01-01 NOTE — Patient Instructions (Signed)
Will refer for colonoscopy.  Will refer to get mammogram downstairs.   Will call with lab results.

## 2013-01-01 NOTE — Progress Notes (Signed)
  Subjective:    Patient ID: Aimee Little, female    DOB: 07/27/1961, 52 y.o.   MRN: [id number]  HPI Patient presents to the clinic to follow up on hypothyroidism, vitamin d deficency, Depression, and fibromyaglia.   Hypothyroidism- uncontrolled. Last TSH continued to be elevated. Will check again today. She feels much better today that when she first saw me but continues to feel better any better with energy. Continues to have cold intolerance. She has lost 3 lbs with just exercise. She has not started phentermine yet.   Vitamin D- uncontrolled. Taking 50,000 IU weekly and needs recheck. Energy level increasing. Pain decreasing.   Fibromyalgia/Depression- cymbalta worked really well for pain. She continues to be on Wellbutrin because pt does not like when she is only on cymbalta because her depression gets worse. Overall feeling much pt. She is not in a flare today. She has started walking regularly.    Review of Systems     Objective:   Physical Exam  Constitutional: She is oriented to person, place, and time. She appears well-developed and well-nourished.  HENT:  Head: Normocephalic and atraumatic.  Eyes: Conjunctivae are normal.  Neck: Normal range of motion. Neck supple. No thyromegaly present.  Cardiovascular: Normal rate, regular rhythm and normal heart sounds.   Pulmonary/Chest: Effort normal and breath sounds normal. She has no wheezes.  Neurological: She is alert and oriented to person, place, and time. No cranial nerve deficit. Coordination normal.  Skin: Skin is warm and dry.  Psychiatric: She has a normal mood and affect. Her behavior is normal.          Assessment & Plan:  Hypothyroidism- Recheck TSH today. Will refill accordingly.  Vitamin D- will recheck today.  Fibromyalgia/Depression- Refilled Wellbutrin and cymbalta. Continue to exercise regularly and stay active. Follow up in 3 months.   Needed Tdap and was given today. Needs mammogram. Ordered  today. Needs colonoscopy. Ordered today.

## 2013-01-02 LAB — VITAMIN D 25 HYDROXY (VIT D DEFICIENCY, FRACTURES): Vit D, 25-Hydroxy: 22 ng/mL — ABNORMAL LOW (ref 30–89)

## 2013-01-29 ENCOUNTER — Ambulatory Visit: Payer: Medicare Other | Admitting: Family Medicine

## 2013-02-02 ENCOUNTER — Other Ambulatory Visit: Payer: Self-pay | Admitting: Physician Assistant

## 2013-02-05 ENCOUNTER — Ambulatory Visit: Payer: Medicare Other | Admitting: Physician Assistant

## 2013-02-08 ENCOUNTER — Ambulatory Visit (INDEPENDENT_AMBULATORY_CARE_PROVIDER_SITE_OTHER): Payer: Medicare Other | Admitting: Physician Assistant

## 2013-02-08 ENCOUNTER — Encounter: Payer: Self-pay | Admitting: Physician Assistant

## 2013-02-08 VITALS — BP 128/80 | HR 88 | Wt 241.0 lb

## 2013-02-08 DIAGNOSIS — K219 Gastro-esophageal reflux disease without esophagitis: Secondary | ICD-10-CM

## 2013-02-08 DIAGNOSIS — Z8719 Personal history of other diseases of the digestive system: Secondary | ICD-10-CM

## 2013-02-08 DIAGNOSIS — R110 Nausea: Secondary | ICD-10-CM

## 2013-02-08 DIAGNOSIS — Z8711 Personal history of peptic ulcer disease: Secondary | ICD-10-CM

## 2013-02-08 LAB — CBC
HCT: 42.2 % (ref 36.0–46.0)
MCHC: 34.4 g/dL (ref 30.0–36.0)
MCV: 91.1 fL (ref 78.0–100.0)
RDW: 13.5 % (ref 11.5–15.5)
WBC: 8.4 10*3/uL (ref 4.0–10.5)

## 2013-02-08 MED ORDER — SUCRALFATE 1 G PO TABS: 1.0000 g | ORAL_TABLET | Freq: Four times a day (QID) | ORAL | Status: DC

## 2013-02-08 MED ORDER — PROMETHAZINE HCL 25 MG PO TABS: 25.0000 mg | ORAL_TABLET | Freq: Four times a day (QID) | ORAL | Status: DC | PRN

## 2013-02-08 MED ORDER — PHENTERMINE HCL 37.5 MG PO CAPS: 37.5000 mg | ORAL_CAPSULE | ORAL | Status: DC

## 2013-02-08 MED ORDER — VALACYCLOVIR HCL 1 G PO TABS: ORAL_TABLET | ORAL | Status: DC

## 2013-02-08 NOTE — Progress Notes (Signed)
  Subjective:    Patient ID: Aimee Little, female    DOB: [birth year], 52 y.o.   MRN: [id number]  HPI Patient is a 52 yo female who presents to the clinic with worsening nausea. Last time patient had worsening nausea was when she had a gastric ulcer which was about 2 years ago and diagnosed via endoscopic. She was negative for H. pylori. She is on Dexilant 60mg  BID. Taking zofran but not really helping. She denies any abdominal pain. Worse after eating [place] last night. She has been drinking a lot of diet sodas and spicy foods.She denies any fever, chills, muscle ache. She has chronic fatigue. Bowel movements have not changed. She denies any vomiting.    Needs refill on valtrex for cold sore exacerbation. No active lesions today.    Review of Systems     Objective:   Physical Exam  Constitutional: She is oriented to person, place, and time. She appears well-developed and well-nourished.  HENT:  Head: Normocephalic and atraumatic.  Cardiovascular: Normal rate, regular rhythm and normal heart sounds.   Pulmonary/Chest: Effort normal and breath sounds normal. She has no wheezes.  Abdominal: Soft. Bowel sounds are normal. She exhibits no distension. There is no tenderness.  Neurological: She is alert and oriented to person, place, and time.  Skin: Skin is warm and dry.  Psychiatric: She has a normal mood and affect. Her behavior is normal.          Assessment & Plan:  Worsening nausea/GERD/history of gastric ulcer- will get CBC make sure any blood loss is not happening. Added carafate 4 times a day with dexilant. Stop zofran and gave phenergan to try for nausea. Educated patient she is eating all the wrong foods for GERD. GAve handout of diet. Stop and don't take any NSAIDs. If continues needs to see GI for follow up endoscopy.  Cold sores- gave rx for any exacerbations.

## 2013-02-08 NOTE — Patient Instructions (Signed)
Diet for Gastroesophageal Reflux Disease, Adult  Reflux (acid reflux) is when acid from your stomach flows up into the esophagus. When acid comes in contact with the esophagus, the acid causes irritation and soreness (inflammation) in the esophagus. When reflux happens often or so severely that it causes damage to the esophagus, it is called gastroesophageal reflux disease (GERD). Nutrition therapy can help ease the discomfort of GERD.  FOODS OR DRINKS TO AVOID OR LIMIT   Smoking or chewing tobacco. Nicotine is one of the most potent stimulants to acid production in the gastrointestinal tract.   Caffeinated and decaffeinated coffee and black tea.   Regular or low-calorie carbonated beverages or energy drinks (caffeine-free carbonated beverages are allowed).    Strong spices, such as black pepper, white pepper, red pepper, cayenne, curry powder, and chili powder.   Peppermint or spearmint.   Chocolate.   High-fat foods, including meats and fried foods. Extra added fats including oils, butter, salad dressings, and nuts. Limit these to less than 8 tsp per day.   Fruits and vegetables if they are not tolerated, such as citrus fruits or tomatoes.   Alcohol.   Any food that seems to aggravate your condition.  If you have questions regarding your diet, call your caregiver or a registered dietitian.  OTHER THINGS THAT MAY HELP GERD INCLUDE:    Eating your meals slowly, in a relaxed setting.   Eating 5 to 6 small meals per day instead of 3 large meals.   Eliminating food for a period of time if it causes distress.   Not lying down until 3 hours after eating a meal.   Keeping the head of your bed raised 6 to 9 inches (15 to 23 cm) by using a foam wedge or blocks under the legs of the bed. Lying flat may make symptoms worse.   Being physically active. Weight loss may be helpful in reducing reflux in overweight or obese adults.   Wear loose fitting clothing  EXAMPLE MEAL PLAN  This meal plan is approximately  2,000 calories based on ChooseMy[employer].gov meal planning guidelines.  Breakfast    cup cooked oatmeal.   1 cup strawberries.   1 cup low-fat milk.   1 oz almonds.  Snack   1 cup cucumber slices.   6 oz yogurt (made from low-fat or fat-free milk).  Lunch   2 slice whole-wheat bread.   2 oz sliced turkey.   2 tsp mayonnaise.   1 cup blueberries.   1 cup snap peas.  Snack   6 whole-wheat crackers.   1 oz string cheese.  Dinner    cup brown rice.   1 cup mixed veggies.   1 tsp olive oil.   3 oz grilled fish.  Document Released: 09/16/2005 Document Revised: 12/09/2011 Document Reviewed: 08/02/2011  [employer] Patient Information 2013 [employer].

## 2013-02-16 ENCOUNTER — Telehealth: Payer: Self-pay | Admitting: *Deleted

## 2013-02-16 NOTE — Telephone Encounter (Signed)
Pt notified & is scheduling an appt

## 2013-02-16 NOTE — Telephone Encounter (Signed)
Pt calls & states that she has started a new job where she is up & down a lot & her feet are swelling "like elephant feet".  This started around 3 weeks ago.  She states that she has cut back her salt & can usually get this under control but not this time.  Pt states that by the end of the day she can "hardly walk".  Would you like her to make an appt or can she have a diuretic? She is a Research scientist (life sciences) patient. Please advise.

## 2013-02-16 NOTE — Telephone Encounter (Signed)
It would be best to have an appointment to discuss whether there is any need to screen for kidney or heart failure.

## 2013-02-18 ENCOUNTER — Encounter: Payer: Self-pay | Admitting: Family Medicine

## 2013-02-18 ENCOUNTER — Ambulatory Visit (INDEPENDENT_AMBULATORY_CARE_PROVIDER_SITE_OTHER): Payer: Medicare Other | Admitting: Family Medicine

## 2013-02-18 VITALS — BP 127/72 | HR 89 | Wt 242.0 lb

## 2013-02-18 DIAGNOSIS — I872 Venous insufficiency (chronic) (peripheral): Secondary | ICD-10-CM

## 2013-02-18 MED ORDER — AMBULATORY NON FORMULARY MEDICATION: Status: DC

## 2013-02-18 MED ORDER — FUROSEMIDE 20 MG PO TABS: ORAL_TABLET | ORAL | Status: DC

## 2013-02-18 NOTE — Progress Notes (Signed)
CC: Aimee Little is a 52 y.o. female is here for Edema   Subjective: HPI:  Patient complains of bilateral leg swelling that has been present for over a year on a daily basis. Trace swelling on a daily basis ever since last summer however mild to moderate severity since starting a job on her feet 4-5 hours a day. Pain is proportional to the degree of swelling mild to moderate and described as a soreness . Worse in the evening, improved after legs are elevated or after sleeping. Has cut out all extra salt in her diet, no processed foods and no canned foods without change in presentation. No recent change in medications since 4 weeks ago swelling worsened.  She is prescribed gabapentin but not taking it, no oral nonsteroidal anti-inflammatory use. She denies shortness of breath, orthopnea, bloating, weeping of the legs, wounds on either legs, motor or sensory disturbances, unilateral swelling, upper extremity swelling, bruising or discoloration of either leg, cramping or weakness in either leg. Symptoms are present on a daily basis   Review Of Systems Outlined In HPI  Past Medical History  Diagnosis Date  . Thyroid disease   . Depression   . Insomnia   . Fibromyalgia   . DDD (degenerative disc disease), lumbar      Family History  Problem Relation Age of Onset  . Depression Mother   . Hyperlipidemia Mother   . Hypertension Mother   . Heart attack Father   . Hypertension Sister   . Diabetes Sister   . Alcohol abuse Brother   . Hypertension Brother      History  Substance Use Topics  . Smoking status: Current Every Day Smoker -- 0.50 packs/day  . Smokeless tobacco: Not on file  . Alcohol Use:      Objective: Filed Vitals:   02/18/13 1131  BP: 127/72  Pulse: 89    General: Alert and Oriented, No Acute Distress HEENT: Pupils equal, round, reactive to light. Conjunctivae clear.  Moist mucous membranes Lungs: Clear to auscultation bilaterally, no wheezing/ronchi/rales.   Comfortable work of breathing. Good air movement. Cardiac: Regular rate and rhythm. Normal S1/S2.  No murmurs, rubs, nor gallops.   Abdomen: Obese soft nontender Extremities: 1+ nonpitting edema from the shins distally symmetric without overlying skin changes.  Strong peripheral pulses.  Mental Status: No depression, anxiety, nor agitation. Skin: Warm and dry.  Assessment & Plan: Aimee Little was seen today for edema.  Diagnoses and associated orders for this visit:  Chronic venous insufficiency - furosemide (LASIX) 20 MG tablet; One to two tablets at noon daily as needed only for fluid in legs. - AMBULATORY NON FORMULARY MEDICATION; Compression stockings, please fit to size, pressure 20-77mmHg one pair one refill. Dx Venous Insufficiency    Venous insufficiency: Uncontrolled chronic condition, Low suspicion for CHF, recent renal function without impairment, suspect venous insufficiency discussed keeping legs elevated when possible, prescription provided for compression stockings while standing, may try Lasix before work shift however if started return to 4 weeks for basic metabolic panel  Return in about 4 weeks (around 03/18/2013).

## 2013-02-19 ENCOUNTER — Other Ambulatory Visit: Payer: Self-pay | Admitting: Family Medicine

## 2013-02-26 ENCOUNTER — Ambulatory Visit: Payer: Medicare Other | Admitting: Physician Assistant

## 2013-03-16 ENCOUNTER — Ambulatory Visit (INDEPENDENT_AMBULATORY_CARE_PROVIDER_SITE_OTHER): Payer: Medicare Other | Admitting: Family Medicine

## 2013-03-16 ENCOUNTER — Encounter: Payer: Self-pay | Admitting: Family Medicine

## 2013-03-16 ENCOUNTER — Ambulatory Visit: Payer: Medicare Other | Admitting: Family Medicine

## 2013-03-16 VITALS — BP 125/80 | HR 121 | Wt 241.0 lb

## 2013-03-16 DIAGNOSIS — H1045 Other chronic allergic conjunctivitis: Secondary | ICD-10-CM

## 2013-03-16 DIAGNOSIS — H1013 Acute atopic conjunctivitis, bilateral: Secondary | ICD-10-CM

## 2013-03-16 MED ORDER — OLOPATADINE HCL 0.1 % OP SOLN: 1.0000 [drp] | Freq: Two times a day (BID) | OPHTHALMIC | Status: DC

## 2013-03-16 NOTE — Progress Notes (Signed)
CC: Aimee Little is a 52 y.o. female is here for irritated eyes   Subjective: HPI:   Complains of redness of both eyes started yesterday morning persisted throughout yesterday, significantly improved this morning now completely absent without any particular intervention. Describes redness as moderate in severity at onset. Associated with a heaviness behind the eyes now without pain whatsoever. Before during and after no vision loss or disturbance. Denies photophobia or painful movements of eyes. Denies recent or remote trauma.  Today he feels back to normal state of health. Has changed mascara but no other personal care products. Before during and after episode denies nasal congestion, fevers, chills, rash, cough, sore throat, wheezing   Review Of Systems Outlined In HPI  Past Medical History  Diagnosis Date  . Thyroid disease   . Depression   . Insomnia   . Fibromyalgia   . DDD (degenerative disc disease), lumbar      Family History  Problem Relation Age of Onset  . Depression Mother   . Hyperlipidemia Mother   . Hypertension Mother   . Heart attack Father   . Hypertension Sister   . Diabetes Sister   . Alcohol abuse Brother   . Hypertension Brother      History  Substance Use Topics  . Smoking status: Current Every Day Smoker -- 0.50 packs/day  . Smokeless tobacco: Not on file  . Alcohol Use:      Objective: Filed Vitals:   03/16/13 1320  BP: 125/80  Pulse: 121    General: Alert and Oriented, No Acute Distress HEENT: Pupils equal, round, reactive to light. Conjunctivae clear.  No debris in the anterior chamber, eyelash follicles unremarkable .External ears unremarkable, canals clear with intact TMs with appropriate landmarks.  Middle ear appears open without effusion. Pink inferior turbinates.  Moist mucous membranes, pharynx without inflammation nor lesions.  Neck supple without palpable lymphadenopathy nor abnormal masses. Lungs: Clear and comfortable work of  breathing Cardiac: Regular rate and rhythm. Normal S1/S2.  No murmurs, rubs, nor gallops.   Mental Status: No depression, anxiety, nor agitation. Skin: Warm and dry.  Assessment & Plan: Aimee Little was seen today for irritated eyes.  Diagnoses and associated orders for this visit:  Allergic conjunctivitis, bilateral - olopatadine (PATANOL) 0.1 % ophthalmic solution; Place 1 drop into both eyes 2 (two) times daily.    Given presentation and resolution of symptoms without intervention suspect allergic conjunctivitis, discussed possibly avoiding her new mascara, may want to use baby shampoo lid massages for the next week to avoid recurrence, Patanol prescription only if needed, take a picture of the eyes at next occurrence and call to arrange sending picture to my office  Return if symptoms worsen or fail to improve.

## 2013-05-03 ENCOUNTER — Ambulatory Visit: Payer: Medicare Other | Admitting: Physician Assistant

## 2013-05-14 ENCOUNTER — Encounter: Payer: Self-pay | Admitting: Physician Assistant

## 2013-05-14 ENCOUNTER — Ambulatory Visit (INDEPENDENT_AMBULATORY_CARE_PROVIDER_SITE_OTHER): Payer: Medicare Other | Admitting: Physician Assistant

## 2013-05-14 VITALS — BP 126/85 | HR 83 | Wt 196.0 lb

## 2013-05-14 DIAGNOSIS — M797 Fibromyalgia: Secondary | ICD-10-CM

## 2013-05-14 DIAGNOSIS — E559 Vitamin D deficiency, unspecified: Secondary | ICD-10-CM

## 2013-05-14 DIAGNOSIS — E039 Hypothyroidism, unspecified: Secondary | ICD-10-CM

## 2013-05-14 DIAGNOSIS — Z13220 Encounter for screening for lipoid disorders: Secondary | ICD-10-CM

## 2013-05-14 DIAGNOSIS — Z79899 Other long term (current) drug therapy: Secondary | ICD-10-CM

## 2013-05-14 DIAGNOSIS — IMO0001 Reserved for inherently not codable concepts without codable children: Secondary | ICD-10-CM

## 2013-05-14 DIAGNOSIS — M1710 Unilateral primary osteoarthritis, unspecified knee: Secondary | ICD-10-CM

## 2013-05-14 DIAGNOSIS — IMO0002 Reserved for concepts with insufficient information to code with codable children: Secondary | ICD-10-CM

## 2013-05-14 MED ORDER — RIZATRIPTAN BENZOATE 10 MG PO TBDP: 10.0000 mg | ORAL_TABLET | ORAL | Status: DC | PRN

## 2013-05-14 MED ORDER — VITAMIN D (ERGOCALCIFEROL) 1.25 MG (50000 UNIT) PO CAPS: 50000.0000 [IU] | ORAL_CAPSULE | ORAL | Status: DC

## 2013-05-14 MED ORDER — BUPROPION HCL ER (XL) 300 MG PO TB24: 300.0000 mg | ORAL_TABLET | Freq: Every day | ORAL | Status: DC

## 2013-05-14 MED ORDER — AMITRIPTYLINE HCL 50 MG PO TABS: 50.0000 mg | ORAL_TABLET | Freq: Every day | ORAL | Status: DC

## 2013-05-14 MED ORDER — DULOXETINE HCL 30 MG PO CPEP: 30.0000 mg | ORAL_CAPSULE | Freq: Every day | ORAL | Status: DC

## 2013-05-14 NOTE — Patient Instructions (Addendum)
Discussed symvisc injections and referral to Dr. Karie Schwalbe.   Start back on Vitamin D and can recheck.

## 2013-05-14 NOTE — Progress Notes (Signed)
  Subjective:    Patient ID: Aimee Little, female    DOB: 12-20-1960, 52 y.o.   MRN: [id number]  HPI Patient presents to the clinic to recheck TSH after changing dose. Today she also is having a lot more knee pain and fatigue.  Pt has lost 45 lbs since last visit. She is not exercising but admits to changing her diet that was full of carbs to a lot more fruits and vegetables. She has also started working in Airline pilot at [employer] but tries to not work anymore than 4 hours a day. She does feel like working could be making her fibromyalgia a lot worse as well as her left knee pain. Left knee pain is worse than right. She would like an injection today. She has not been taking elavil or vitamin d.      Review of Systems     Objective:   Physical Exam  Constitutional: She is oriented to person, place, and time. She appears well-developed and well-nourished.  Obese.  HENT:  Head: Normocephalic and atraumatic.  Cardiovascular: Normal rate, regular rhythm and normal heart sounds.   Pulmonary/Chest: Effort normal and breath sounds normal.  Neurological: She is alert and oriented to person, place, and time.  Skin: Skin is warm and dry.  Psychiatric: She has a normal mood and affect. Her behavior is normal.          Assessment & Plan:  Hypothyroidism- Changed dose will recheck.   Left knee DJD- discussed steroid injection. It has been less than 6 months. She can have up to 3 injections a year but I think simvisc might be a good option for patient. I have not seen xrays. Pt had imaging done at [facility]. Discussed with patient she need to bring in for Dr. Bonnita Levan to look at. Pt would like to consider appt. She is aware she can make appt anytime with him. In meantime continue voltaren gel. Ibuprofen if can tolerate. Tramadol for breakthrough pain. Icing after work and wearing good supportive shoes may also help.   Fibromyalgia- Pt on cymbalta and neurontin. She hasn't been taking her elavil I  encouraged her to start back up on elavil. Keeping moving is always the best treatment. As her diet changes and losing weight should also make her feel better.    Vitamin D deficiency- refilled script. Will recheck when been on for at least 6 weeks.   Screening labs ordered for cholesterol.

## 2013-05-16 DIAGNOSIS — E559 Vitamin D deficiency, unspecified: Secondary | ICD-10-CM | POA: Insufficient documentation

## 2013-05-18 LAB — BASIC METABOLIC PANEL
BUN: 19 mg/dL (ref 6–23)
CO2: 22 mEq/L (ref 19–32)
Chloride: 109 mEq/L (ref 96–112)
Potassium: 4.2 mEq/L (ref 3.5–5.3)

## 2013-05-18 LAB — LIPID PANEL
HDL: 44 mg/dL (ref >39)
LDL Cholesterol: 117 mg/dL — ABNORMAL HIGH (ref 0–99)
Triglycerides: 82 mg/dL (ref <150)
VLDL: 16 mg/dL (ref 0–40)

## 2013-05-18 LAB — T4, FREE: Free T4: 1.21 ng/dL (ref 0.80–1.80)

## 2013-05-18 LAB — TSH: TSH: 4.21 u[IU]/mL (ref 0.350–4.500)

## 2013-06-03 ENCOUNTER — Ambulatory Visit (INDEPENDENT_AMBULATORY_CARE_PROVIDER_SITE_OTHER): Payer: Medicare Other | Admitting: Family Medicine

## 2013-06-03 ENCOUNTER — Ambulatory Visit: Payer: Medicare Other | Admitting: Sports Medicine

## 2013-06-03 ENCOUNTER — Encounter: Payer: Self-pay | Admitting: Family Medicine

## 2013-06-03 VITALS — BP 124/84 | HR 72

## 2013-06-03 DIAGNOSIS — G43901 Migraine, unspecified, not intractable, with status migrainosus: Secondary | ICD-10-CM

## 2013-06-03 DIAGNOSIS — G43909 Migraine, unspecified, not intractable, without status migrainosus: Secondary | ICD-10-CM

## 2013-06-03 MED ORDER — PROMETHAZINE HCL 25 MG/ML IJ SOLN
12.5000 mg | Freq: Once | INTRAMUSCULAR | Status: AC
  Administered 2013-06-03: 12.5 mg via INTRAMUSCULAR

## 2013-06-03 MED ORDER — PROMETHAZINE HCL 25 MG PO TABS: 25.0000 mg | ORAL_TABLET | Freq: Four times a day (QID) | ORAL | Status: DC | PRN

## 2013-06-03 MED ORDER — KETOROLAC TROMETHAMINE 60 MG/2ML IM SOLN
60.0000 mg | Freq: Once | INTRAMUSCULAR | Status: AC
  Administered 2013-06-03: 60 mg via INTRAMUSCULAR

## 2013-06-03 NOTE — Progress Notes (Signed)
   Subjective:    Patient ID: Aimee Little, female    DOB: [birth year], 52 y.o.   MRN: [id number]  HPI Migraines started 2 days ago.  Took a maxalt and then went shopping and by that evening felt awful.  She has been vomiting and HA has been severe.  Says thinks the heat may have triggered her HA.  Took 2 maxalt yesterday.  Has zofran for nausea abut doesn't help that much . HA is currently 10/10.  Had sharp pain initially. HA is frontal and over both temples . She says typically the Maxalt works well. She says she's had about 3 migraines over the last 3 months but this is definitely the worst. She does use tobacco. She denies any changes in sleep habits or caffeine.   Review of Systems     Objective:   Physical Exam  Constitutional: She is oriented to person, place, and time. She appears well-developed and well-nourished.  HENT:  Head: Normocephalic and atraumatic.  Cardiovascular: Normal rate, regular rhythm and normal heart sounds.   Pulmonary/Chest: Effort normal and breath sounds normal.  Neurological: She is alert and oriented to person, place, and time. She has normal reflexes. No cranial nerve deficit. Coordination normal.  Skin: Skin is warm and dry.  Psychiatric: She has a normal mood and affect. Her behavior is normal.          Assessment & Plan:  Acute status Migrainous - HA is 8/10 now after toradol and phenergan injection. Says normally the maxalt works well.  Encouraged her to go home and rest today. Work note given. It does not break her status migraine then encouraged her to call the office tomorrow and I will call her in a five-day taper of prednisone. Also discontinue the Zofran and sent a prescription for Phenergan for her nausea as she says it seems to work better and last a little longer. Continue work on [employer], good sleep, decreasing stress and avoiding tobacco.

## 2013-06-04 ENCOUNTER — Other Ambulatory Visit: Payer: Self-pay | Admitting: Physician Assistant

## 2013-06-04 NOTE — Telephone Encounter (Signed)
Do not see this on med list

## 2013-06-07 ENCOUNTER — Other Ambulatory Visit: Payer: Self-pay | Admitting: Physician Assistant

## 2013-06-11 ENCOUNTER — Other Ambulatory Visit: Payer: Self-pay | Admitting: Physician Assistant

## 2013-06-15 ENCOUNTER — Encounter: Payer: Self-pay | Admitting: Family Medicine

## 2013-06-15 ENCOUNTER — Ambulatory Visit (INDEPENDENT_AMBULATORY_CARE_PROVIDER_SITE_OTHER): Payer: Medicare Other | Admitting: Family Medicine

## 2013-06-15 VITALS — BP 136/61 | HR 97 | Wt 242.0 lb

## 2013-06-15 DIAGNOSIS — M5137 Other intervertebral disc degeneration, lumbosacral region: Secondary | ICD-10-CM

## 2013-06-15 MED ORDER — TIZANIDINE HCL 4 MG PO CAPS: 4.0000 mg | ORAL_CAPSULE | Freq: Three times a day (TID) | ORAL | Status: DC | PRN

## 2013-06-15 NOTE — Progress Notes (Signed)
CC: Aimee Little is a 52 y.o. female is here for lower back pain/spasm   Subjective: HPI:  Patient complains of worsening chronic low back pain described as moderate muscle spasms in the lumbar region. Pain is been worsening over the past 2 days since she ran out of Zanaflex. She's been using this medication for approximately a year with great resolution of her pain above.. she denies any recent or remote over exertion or trauma. Pain is described only as tightness that is worse with rotational maneuvers nothing else makes better or worse other than above. She denies midline back pain, saddle paresthesia, bowel or bladder incontinence, nor motor or sensory disturbances. Denies overlying skin changes at the site of discomfort. Tells me that she has been told she has L4-L5 L5-S1 degenerative disc disease with disc herniations. She denies any radicular pain down either leg.   Review Of Systems Outlined In HPI  Past Medical History  Diagnosis Date  . Thyroid disease   . Depression   . Insomnia   . Fibromyalgia   . DDD (degenerative disc disease), lumbar      Family History  Problem Relation Age of Onset  . Depression Mother   . Hyperlipidemia Mother   . Hypertension Mother   . Heart attack Father   . Hypertension Sister   . Diabetes Sister   . Alcohol abuse Brother   . Hypertension Brother      History  Substance Use Topics  . Smoking status: Current Every Day Smoker -- 0.50 packs/day  . Smokeless tobacco: Not on file  . Alcohol Use:      Objective: Filed Vitals:   06/15/13 1449  BP: 136/61  Pulse: 97    Vital signs reviewed. General: Alert and Oriented, No Acute Distress HEENT: Pupils equal, round, reactive to light. Conjunctivae clear.  External ears unremarkable.  Moist mucous membranes. Lungs: Clear and comfortable work of breathing, speaking in full sentences without accessory muscle use. Cardiac: Regular rate and rhythm.  Neuro: L4 and S1 DTRs two over four  bilaterally Extremities: No peripheral edema.  Strong peripheral pulses. Full range of motion strength in both lower extremities. Straight leg raise negative bilaterally, FABER/FADIR negative bilaterally, negative log roll Back: No midline spinous process tenderness in the lumbar region nor pain over SI joint bilaterally pain is reproduced with palpation of hypertonic paraspinal lumbar musculature Mental Status: No depression, anxiety, nor agitation. Logical though process. Skin: Warm and dry.  Assessment & Plan: [name] was seen today for lower back pain/spasm.  Diagnoses and associated orders for this visit:  DISC DISEASE, LUMBAR - tiZANidine (ZANAFLEX) 4 MG capsule; Take 1 capsule (4 mg total) by mouth 3 (three) times daily as needed (Back Pain).    Lumbar disc disease: Suspect muscle spasm is a protective consequence due to her chronic lumbar disc disease and pain, restart Zanaflex, if not improved in 24 hours call for prednisone  Return if symptoms worsen or fail to improve.

## 2013-06-16 ENCOUNTER — Telehealth: Payer: Self-pay | Admitting: *Deleted

## 2013-06-16 DIAGNOSIS — M549 Dorsalgia, unspecified: Secondary | ICD-10-CM

## 2013-06-16 MED ORDER — PREDNISONE 20 MG PO TABS: ORAL_TABLET | ORAL | Status: AC

## 2013-06-16 MED ORDER — MELOXICAM 15 MG PO TABS: 15.0000 mg | ORAL_TABLET | Freq: Every day | ORAL | Status: DC | PRN

## 2013-06-16 NOTE — Telephone Encounter (Signed)
Both have been sent to [facility] pharmacy.

## 2013-06-16 NOTE — Telephone Encounter (Signed)
Pt states she is not feeling better. States she had some old Meloxicam that she took last night and this am with the Zanaflex. States feels that is has worked better with the Meloxicam. Informed pt you also mentioned Prednisone to take in your office note. She asked if would would send her some meloxicam to her pharmacy also.  Meyer Cory, LPN

## 2013-07-02 ENCOUNTER — Ambulatory Visit: Payer: Medicare Other | Admitting: Physician Assistant

## 2013-07-02 ENCOUNTER — Other Ambulatory Visit: Payer: Self-pay | Admitting: Physician Assistant

## 2013-07-16 ENCOUNTER — Other Ambulatory Visit: Payer: Self-pay | Admitting: Family Medicine

## 2013-07-21 ENCOUNTER — Ambulatory Visit (INDEPENDENT_AMBULATORY_CARE_PROVIDER_SITE_OTHER): Payer: Medicare Other | Admitting: Physician Assistant

## 2013-07-21 ENCOUNTER — Encounter: Payer: Self-pay | Admitting: Physician Assistant

## 2013-07-21 VITALS — BP 132/67 | HR 103 | Wt 244.0 lb

## 2013-07-21 DIAGNOSIS — Z23 Encounter for immunization: Secondary | ICD-10-CM

## 2013-07-21 DIAGNOSIS — M1710 Unilateral primary osteoarthritis, unspecified knee: Secondary | ICD-10-CM

## 2013-07-21 DIAGNOSIS — I872 Venous insufficiency (chronic) (peripheral): Secondary | ICD-10-CM

## 2013-07-21 DIAGNOSIS — IMO0002 Reserved for concepts with insufficient information to code with codable children: Secondary | ICD-10-CM

## 2013-07-21 DIAGNOSIS — M1712 Unilateral primary osteoarthritis, left knee: Secondary | ICD-10-CM

## 2013-07-21 DIAGNOSIS — G43909 Migraine, unspecified, not intractable, without status migrainosus: Secondary | ICD-10-CM | POA: Insufficient documentation

## 2013-07-21 DIAGNOSIS — G43901 Migraine, unspecified, not intractable, with status migrainosus: Secondary | ICD-10-CM

## 2013-07-21 MED ORDER — PROMETHAZINE HCL 25 MG PO TABS: 25.0000 mg | ORAL_TABLET | Freq: Four times a day (QID) | ORAL | Status: DC | PRN

## 2013-07-21 MED ORDER — FUROSEMIDE 20 MG PO TABS
ORAL_TABLET | ORAL | Status: DC
Start: 2013-07-21 — End: 2015-10-25

## 2013-07-21 NOTE — Progress Notes (Addendum)
  Subjective:    Patient ID: Aimee Little, female    DOB: 1961/08/04, 52 y.o.   MRN: [id number]  HPI  Patient presents to the clinic to followup on migraines.  Patient was seen by Dr. Glade Lloyd a month or so ago for migraines. She was having an ongoing migraine that was not controlled with maximal. She reports she is much more controlled today. She is averaging 2 migraines a month and if she takes Maxalt at onset clears up before having to come in for shots. She gets extremely nauseated with migraines. She takes Phenergan and works very well. She does need a refill on Phenergan today.  Osteoarthritis of left knee-she received a cortisone injection in left knee back in April. Helped symptoms drastically. She would like another injection today. She has considered our discussion on seeing Dr. Karie Schwalbe. and getting the Synvisc injections. She would like to pass on that until least the spring. She continues to take her home regimen of anti-inflammatories both oral and creams. Nothing helps 100 percent but they all help some. She has started exercising with some of her friends. Her left knee pain makes it hard but she is trying to push through it. She does have access to a full and workout.  This disease, lumbar-patient gives a day today that she is being scheduled at the [place] [facility] for an epidural injection of her lumbar spine. Previously these have helped significantly.       Review of Systems     Objective:   Physical Exam  Constitutional: She appears well-developed and well-nourished.  HENT:  Head: Normocephalic and atraumatic.  Cardiovascular: Normal rate, regular rhythm and normal heart sounds.   Pulmonary/Chest: Effort normal and breath sounds normal. She has no wheezes.  Skin: Skin is warm and dry.  Psychiatric: She has a normal mood and affect. Her behavior is normal.          Assessment & Plan:  Migraines- I. that patient feels like migraines are more controlled today.  Refilled Phenergan to use as needed. Patient reported she did not need a refill on Maxalt.  Osteoarthritis of left knee-discuss with patient we did not have up to date imaging on her left knee. She reports she has had done within the year she believes. She will try to get images so we can scan into our system. Injection into left knee was given today. Encouraged patient to exercise does state to less impact on joint exercises such as water aerobics and exercise bike. Stay away from walking long distances or jogging. Continue to work on weight loss. As you lose weight he will significantly decrease the pressure on knees.  Knee Injection Procedure Note  Pre-operative Diagnosis: left osteoarthritis   Post-operative Diagnosis: same  Indications: Symptom relief from osteoarthritis  Anesthesia: ethyl cholride   Procedure Details   Verbal consent was obtained for the procedure. The joint was prepped with Betadine ethylcholr A 22 gauge needle was inserted into the superior aspect of the joint from a lateral approach. 9 ml 1% lidocaine and 1 ml of 40mg  of depo medrol was then injected into the joint through the same needle. The needle was removed and the area cleansed and dressed.  Complications:  None; patient tolerated the procedure well.  disc disease, lumbar- encouraged the patient has been scheduled for her lumbar epidural injection. I do think this will significantly help pain.  Chronic venous insuffiency- lasix refilled.

## 2013-07-23 ENCOUNTER — Other Ambulatory Visit: Payer: Self-pay | Admitting: Physician Assistant

## 2013-07-23 ENCOUNTER — Other Ambulatory Visit: Payer: Self-pay | Admitting: Family Medicine

## 2013-08-31 ENCOUNTER — Other Ambulatory Visit: Payer: Self-pay | Admitting: Physician Assistant

## 2013-09-02 ENCOUNTER — Other Ambulatory Visit: Payer: Self-pay | Admitting: Physician Assistant

## 2013-09-02 ENCOUNTER — Other Ambulatory Visit: Payer: Self-pay | Admitting: *Deleted

## 2013-09-02 MED ORDER — TRAMADOL HCL 50 MG PO TABS: ORAL_TABLET | ORAL | Status: DC

## 2013-10-01 ENCOUNTER — Encounter: Payer: Self-pay | Admitting: Physician Assistant

## 2013-10-01 ENCOUNTER — Ambulatory Visit (INDEPENDENT_AMBULATORY_CARE_PROVIDER_SITE_OTHER): Payer: Medicare Other | Admitting: Physician Assistant

## 2013-10-01 VITALS — BP 104/61 | HR 79 | Temp 97.8°F

## 2013-10-01 DIAGNOSIS — J329 Chronic sinusitis, unspecified: Secondary | ICD-10-CM

## 2013-10-01 MED ORDER — AMOXICILLIN-POT CLAVULANATE 875-125 MG PO TABS: 1.0000 | ORAL_TABLET | Freq: Two times a day (BID) | ORAL | Status: DC

## 2013-10-01 MED ORDER — METHYLPREDNISOLONE SODIUM SUCC 125 MG IJ SOLR
125.0000 mg | Freq: Once | INTRAMUSCULAR | Status: AC
  Administered 2013-10-01: 125 mg via INTRAMUSCULAR

## 2013-10-01 NOTE — Patient Instructions (Signed)

## 2013-10-01 NOTE — Progress Notes (Signed)
   Subjective:    Patient ID: Aimee Little, female    DOB: [birth year], 53 y.o.   MRN: [id number]  HPI Pt reports 7 days of HA, sinus pressure, sneezing, congestion, ear congestion, body aches and fatigue. Seem to be getting worse. Denies any fever, nausea, SOB, or wheezing. She does have a dry cough.  Tried OTC mucinex which has helped some. Using flonase daily.    Review of Systems     Objective:   Physical Exam  Constitutional: She is oriented to person, place, and time. She appears well-developed and well-nourished.  HENT:  Head: Normocephalic and atraumatic.  Right Ear: External ear normal.  Left Ear: External ear normal.  Nose: Nose normal.  Mouth/Throat: Oropharynx is clear and moist.  TM's clear bilaterally.   Maxillary tenderness to palpation bilaterally.   Eyes: Conjunctivae are normal.  Neck: Normal range of motion. Neck supple.  Cardiovascular: Normal rate, regular rhythm and normal heart sounds.   Pulmonary/Chest: Effort normal and breath sounds normal. She has no wheezes.  Lymphadenopathy:    She has no cervical adenopathy.  Neurological: She is alert and oriented to person, place, and time.  Skin: Skin is warm and dry.  Psychiatric: She has a normal mood and affect. Her behavior is normal.          Assessment & Plan:  Acute sinusitis- shot of solumedrol 125mg  was given in office today. Augmentin for 10 days sent home with patient. HO given. Call if not improving.

## 2013-10-02 ENCOUNTER — Other Ambulatory Visit: Payer: Self-pay | Admitting: Physician Assistant

## 2013-10-05 ENCOUNTER — Telehealth: Payer: Self-pay | Admitting: *Deleted

## 2013-10-05 NOTE — Telephone Encounter (Signed)
Pt calls & states that she is feeling no better but no better worse.  She states that she had n/v & diarrhea yesterday & now she has a runny nose & a really bad HA.  She wants to know if there is anything else we can give her.  She is still taking the abx.  Please advise.

## 2013-10-06 ENCOUNTER — Encounter: Payer: Self-pay | Admitting: Physician Assistant

## 2013-10-06 ENCOUNTER — Ambulatory Visit (INDEPENDENT_AMBULATORY_CARE_PROVIDER_SITE_OTHER): Payer: Medicare Other | Admitting: Physician Assistant

## 2013-10-06 VITALS — BP 126/78 | HR 95

## 2013-10-06 DIAGNOSIS — A088 Other specified intestinal infections: Secondary | ICD-10-CM

## 2013-10-06 DIAGNOSIS — G43901 Migraine, unspecified, not intractable, with status migrainosus: Secondary | ICD-10-CM

## 2013-10-06 DIAGNOSIS — A084 Viral intestinal infection, unspecified: Secondary | ICD-10-CM

## 2013-10-06 MED ORDER — KETOROLAC TROMETHAMINE 60 MG/2ML IM SOLN
60.0000 mg | Freq: Once | INTRAMUSCULAR | Status: AC
  Administered 2013-10-06: 60 mg via INTRAMUSCULAR

## 2013-10-06 MED ORDER — PROMETHAZINE HCL 25 MG/ML IJ SOLN
25.0000 mg | Freq: Once | INTRAMUSCULAR | Status: AC
  Administered 2013-10-06: 25 mg via INTRAMUSCULAR

## 2013-10-06 NOTE — Progress Notes (Addendum)
   Subjective:    Patient ID: Collier Bullock, female    DOB: Jul 20, 1961, 53 y.o.   MRN: [id number]  HPI Patient is a 53 year old female who presents to the clinic with migraine that started yesterday. This migraine is much like her other migraines except she has seen a mentioned to or this time but she has not of 4. Usually one dose of Maxalt treatments headache however this time it has continued. She was very nauseated and vomiting one day before migraines started and has continued through migraine. She does report that area has stopped for the last 24 hours. She is also not vomited but has still felt nauseated. Her whole body aches. Other than trying Maxalt and Phenergan that she has at home she has not tried anything else. Nothing seems to be helping. No fever.     Review of Systems     Objective:   Physical Exam  Constitutional: She is oriented to person, place, and time.  Found on exam table in dark room.   HENT:  Head: Normocephalic and atraumatic.  Right Ear: External ear normal.  Left Ear: External ear normal.  Nose: Nose normal.  Mouth/Throat: Oropharynx is clear and moist.  Eyes: Conjunctivae are normal.  Neck: Normal range of motion. Neck supple.  Cardiovascular: Normal rate, regular rhythm and normal heart sounds.   Pulmonary/Chest: Effort normal and breath sounds normal. She has no wheezes.  Abdominal: Soft.  Generalized tenderness throughout the abdomen.  Neurological: She is alert and oriented to person, place, and time.  Skin: Skin is warm.  Psychiatric: She has a normal mood and affect. Her behavior is normal.          Assessment & Plan:  Migraine with status migrainosus/viral gastroenteritis- treated with 60 g IM Toradol in office today with 25 mg IM Phenergan. Refilled Phenergan 25 mg tabs to use at home. Encouraged hydration and rest. He does so much patient started with our old gastroenteritis the triggered migraine. Encouraged brat diet. Call if not  improving.

## 2013-10-06 NOTE — Patient Instructions (Addendum)
Viral Gastroenteritis Viral gastroenteritis is also known as stomach flu. This condition affects the stomach and intestinal tract. It can cause sudden diarrhea and vomiting. The illness typically lasts 3 to 8 days. Most people develop an immune response that eventually gets rid of the virus. While this natural response develops, the virus can make you quite ill. CAUSES  Many different viruses can cause gastroenteritis, such as rotavirus or noroviruses. You can catch one of these viruses by consuming contaminated food or water. You may also catch a virus by sharing utensils or other personal items with an infected person or by touching a contaminated surface. SYMPTOMS  The most common symptoms are diarrhea and vomiting. These problems can cause a severe loss of body fluids (dehydration) and a body salt (electrolyte) imbalance. Other symptoms may include:  Fever.  Headache.  Fatigue.  Abdominal pain. DIAGNOSIS  Your caregiver can usually diagnose viral gastroenteritis based on your symptoms and a physical exam. A stool sample may also be taken to test for the presence of viruses or other infections. TREATMENT  This illness typically goes away on its own. Treatments are aimed at rehydration. The most serious cases of viral gastroenteritis involve vomiting so severely that you are not able to keep fluids down. In these cases, fluids must be given through an intravenous line (IV). HOME CARE INSTRUCTIONS   Drink enough fluids to keep your urine clear or pale yellow. Drink small amounts of fluids frequently and increase the amounts as tolerated.  Ask your caregiver for specific rehydration instructions.  Avoid:  Foods high in sugar.  Alcohol.  Carbonated drinks.  Tobacco.  Juice.  Caffeine drinks.  Extremely hot or cold fluids.  Fatty, greasy foods.  Too much intake of anything at one time.  Dairy products until 24 to 48 hours after diarrhea stops.  You may consume probiotics.  Probiotics are active cultures of beneficial bacteria. They may lessen the amount and number of diarrheal stools in adults. Probiotics can be found in yogurt with active cultures and in supplements.  Wash your hands well to avoid spreading the virus.  Only take over-the-counter or prescription medicines for pain, discomfort, or fever as directed by your caregiver. Do not give aspirin to children. Antidiarrheal medicines are not recommended.  Ask your caregiver if you should continue to take your regular prescribed and over-the-counter medicines.  Keep all follow-up appointments as directed by your caregiver. SEEK IMMEDIATE MEDICAL CARE IF:   You are unable to keep fluids down.  You do not urinate at least once every 6 to 8 hours.  You develop shortness of breath.  You notice blood in your stool or vomit. This may look like coffee grounds.  You have abdominal pain that increases or is concentrated in one small area (localized).  You have persistent vomiting or diarrhea.  You have a fever.  The patient is a child younger than 3 months, and he or she has a fever.  The patient is a child older than 3 months, and he or she has a fever and persistent symptoms.  The patient is a child older than 3 months, and he or she has a fever and symptoms suddenly get worse.  The patient is a baby, and he or she has no tears when crying. MAKE SURE YOU:   Understand these instructions.  Will watch your condition.  Will get help right away if you are not doing well or get worse. Document Released: 09/16/2005 Document Revised: 12/09/2011 Document Reviewed: 07/03/2011   [employer] Patient Information 2014 [place].   Migraine Headache A migraine headache is an intense, throbbing pain on one or both sides of your head. A migraine can last for 30 minutes to several hours. CAUSES  The exact cause of a migraine headache is not always known. However, a migraine may be caused when nerves in the  brain become irritated and release chemicals that cause inflammation. This causes pain. SYMPTOMS  Pain on one or both sides of your head.  Pulsating or throbbing pain.  Severe pain that prevents daily activities.  Pain that is aggravated by any physical activity.  Nausea, vomiting, or both.  Dizziness.  Pain with exposure to bright lights, loud noises, or activity.  General sensitivity to bright lights, loud noises, or smells. Before you get a migraine, you may get warning signs that a migraine is coming (aura). An aura may include:  Seeing flashing lights.  Seeing bright spots, halos, or zig-zag lines.  Having tunnel vision or blurred vision.  Having feelings of numbness or tingling.  Having trouble talking.  Having muscle weakness. MIGRAINE TRIGGERS  Alcohol.  Smoking.  Stress.  Menstruation.  Aged cheeses.  Foods or drinks that contain nitrates, glutamate, aspartame, or tyramine.  Lack of sleep.  Chocolate.  Caffeine.  Hunger.  Physical exertion.  Fatigue.  Medicines used to treat chest pain (nitroglycerine), birth control pills, estrogen, and some blood pressure medicines. DIAGNOSIS  A migraine headache is often diagnosed based on:  Symptoms.  Physical examination.  A CT scan or MRI of your head. TREATMENT Medicines may be given for pain and nausea. Medicines can also be given to help prevent recurrent migraines.  HOME CARE INSTRUCTIONS  Only take over-the-counter or prescription medicines for pain or discomfort as directed by your caregiver. The use of long-term narcotics is not recommended.  Lie down in a dark, quiet room when you have a migraine.  Keep a journal to find out what may trigger your migraine headaches. For example, write down:  What you eat and drink.  How much sleep you get.  Any change to your diet or medicines.  Limit alcohol consumption.  Quit smoking if you smoke.  Get 7 to 9 hours of sleep, or as  recommended by your caregiver.  Limit stress.  Keep lights dim if bright lights bother you and make your migraines worse. SEEK IMMEDIATE MEDICAL CARE IF:   Your migraine becomes severe.  You have a fever.  You have a stiff neck.  You have vision loss.  You have muscular weakness or loss of muscle control.  You start losing your balance or have trouble walking.  You feel faint or pass out.  You have severe symptoms that are different from your first symptoms. MAKE SURE YOU:   Understand these instructions.  Will watch your condition.  Will get help right away if you are not doing well or get worse. Document Released: 09/16/2005 Document Revised: 12/09/2011 Document Reviewed: 09/06/2011 [facility] Patient Information 2014 [place], [place].

## 2013-10-06 NOTE — Telephone Encounter (Signed)
Made appt

## 2013-10-08 ENCOUNTER — Other Ambulatory Visit: Payer: Self-pay | Admitting: Physician Assistant

## 2013-10-13 ENCOUNTER — Telehealth: Payer: Self-pay | Admitting: *Deleted

## 2013-10-13 MED ORDER — FLUCONAZOLE 150 MG PO TABS: 150.0000 mg | ORAL_TABLET | Freq: Every day | ORAL | Status: DC

## 2013-10-13 NOTE — Telephone Encounter (Signed)
Ok for diflucian 150mg  one tablet no refills.

## 2013-10-13 NOTE — Telephone Encounter (Signed)
Pt notified rx sent.

## 2013-10-13 NOTE — Telephone Encounter (Signed)
Pt left message stating that she now has a yeast infection from the abx she's been taking & wants to know if you can send her in a diflucan.

## 2013-10-21 ENCOUNTER — Ambulatory Visit: Payer: Medicare Other | Admitting: Family Medicine

## 2013-10-26 ENCOUNTER — Other Ambulatory Visit: Payer: Self-pay | Admitting: Physician Assistant

## 2013-11-03 ENCOUNTER — Ambulatory Visit (INDEPENDENT_AMBULATORY_CARE_PROVIDER_SITE_OTHER): Payer: Medicare Other | Admitting: Physician Assistant

## 2013-11-03 ENCOUNTER — Encounter: Payer: Self-pay | Admitting: Physician Assistant

## 2013-11-03 VITALS — BP 137/81 | HR 101 | Wt 248.0 lb

## 2013-11-03 DIAGNOSIS — Z87891 Personal history of nicotine dependence: Secondary | ICD-10-CM

## 2013-11-03 DIAGNOSIS — Z1211 Encounter for screening for malignant neoplasm of colon: Secondary | ICD-10-CM

## 2013-11-03 DIAGNOSIS — M1712 Unilateral primary osteoarthritis, left knee: Secondary | ICD-10-CM

## 2013-11-03 DIAGNOSIS — M797 Fibromyalgia: Secondary | ICD-10-CM

## 2013-11-03 DIAGNOSIS — F329 Major depressive disorder, single episode, unspecified: Secondary | ICD-10-CM

## 2013-11-03 DIAGNOSIS — E039 Hypothyroidism, unspecified: Secondary | ICD-10-CM

## 2013-11-03 DIAGNOSIS — Z0000 Encounter for general adult medical examination without abnormal findings: Secondary | ICD-10-CM

## 2013-11-03 DIAGNOSIS — M1710 Unilateral primary osteoarthritis, unspecified knee: Secondary | ICD-10-CM

## 2013-11-03 DIAGNOSIS — IMO0001 Reserved for inherently not codable concepts without codable children: Secondary | ICD-10-CM

## 2013-11-03 DIAGNOSIS — R112 Nausea with vomiting, unspecified: Secondary | ICD-10-CM

## 2013-11-03 DIAGNOSIS — F3289 Other specified depressive episodes: Secondary | ICD-10-CM

## 2013-11-03 DIAGNOSIS — Z79899 Other long term (current) drug therapy: Secondary | ICD-10-CM

## 2013-11-03 DIAGNOSIS — G894 Chronic pain syndrome: Secondary | ICD-10-CM

## 2013-11-03 DIAGNOSIS — IMO0002 Reserved for concepts with insufficient information to code with codable children: Secondary | ICD-10-CM

## 2013-11-03 DIAGNOSIS — Z1239 Encounter for other screening for malignant neoplasm of breast: Secondary | ICD-10-CM

## 2013-11-03 LAB — COMPLETE METABOLIC PANEL WITH GFR
ALT: 21 U/L (ref 0–35)
AST: 15 U/L (ref 0–37)
Albumin: 4.2 g/dL (ref 3.5–5.2)
Alkaline Phosphatase: 99 U/L (ref 39–117)
BUN: 13 mg/dL (ref 6–23)
CALCIUM: 9.2 mg/dL (ref 8.4–10.5)
CHLORIDE: 107 meq/L (ref 96–112)
CO2: 21 mEq/L (ref 19–32)
Creat: 0.71 mg/dL (ref 0.50–1.10)
GFR, Est African American: >89 mL/min
GFR, Est Non African American: >89 mL/min
Glucose, Bld: 86 mg/dL (ref 70–99)
POTASSIUM: 4.1 meq/L (ref 3.5–5.3)
SODIUM: 140 meq/L (ref 135–145)
TOTAL PROTEIN: 6.6 g/dL (ref 6.0–8.3)
Total Bilirubin: 0.3 mg/dL (ref 0.2–1.2)

## 2013-11-03 LAB — TSH: TSH: 16.056 u[IU]/mL — ABNORMAL HIGH (ref 0.350–4.500)

## 2013-11-03 MED ORDER — DULOXETINE HCL 60 MG PO CPEP: 60.0000 mg | ORAL_CAPSULE | Freq: Every day | ORAL | Status: DC

## 2013-11-03 MED ORDER — VALACYCLOVIR HCL 1 G PO TABS: ORAL_TABLET | ORAL | Status: DC

## 2013-11-03 NOTE — Progress Notes (Signed)
Subjective:    Patient ID: Aimee Little, female    DOB: 17-Feb-1961, 53 y.o.   MRN: [id number]  HPI     Review of Systems     Objective:   Physical Exam        Assessment & Plan:   Subjective:     Aimee Little is a 53 y.o. female and is here for a comprehensive physical exam. The patient reports problems - continues to have worsening depression, pain is out of control, needs some refills on medication. .  Depression/Fibromyaglia/Osteoarthritis- patient admits to feeling very depressed over the past couple months. Her pain has increased significantly. She does report that Mobic makes better. She's concerned it is making her nauseous but if she doesn't take it she is not able to work. Patient denies any vomiting or diarrhea. She continues to take, and Carafate.  Patient did quit smoking approximately 3 weeks ago. She did this cold [place].  History   Social History  . Marital Status: Married    Spouse Name: N/A    Number of Children: N/A  . Years of Education: N/A   Occupational History  . Not on file.   Social History Main Topics  . Smoking status: Former Smoker -- 0.50 packs/day    Quit date: 10/02/2013  . Smokeless tobacco: Not on file  . Alcohol Use: Not on file  . Drug Use: Not on file  . Sexual Activity: Not on file   Other Topics Concern  . Not on file   Social History Narrative  . No narrative on file   Health Maintenance  Topic Date Due  . Mammogram  12/03/2009  . Colonoscopy  12/03/2009  . Pap Smear  11/03/2014  . Influenza Vaccine  04/30/2014  . Tetanus/tdap  01/02/2023    The following portions of the patient's history were reviewed and updated as appropriate: allergies, current medications, past family history, past medical history, past social history, past surgical history and problem list.  Review of Systems A comprehensive review of systems was negative.   Objective:    BP 137/81  Pulse 101  Wt 248 lb (112.492 kg) General  appearance: alert, cooperative, appears stated age and moderately obese Head: Normocephalic, without obvious abnormality, atraumatic Eyes: conjunctivae/corneas clear. PERRL, EOM's intact. Fundi benign. Ears: normal TM's and external ear canals both ears Nose: Nares normal. Septum midline. Mucosa normal. No drainage or sinus tenderness. Throat: lips, mucosa, and tongue normal; teeth and gums normal Neck: no adenopathy, no carotid bruit, no JVD, supple, symmetrical, trachea midline and thyroid not enlarged, symmetric, no tenderness/mass/nodules Back: symmetric, no curvature. ROM normal. No CVA tenderness. Lungs: clear to auscultation bilaterally Heart: regular rate and rhythm, S1, S2 normal, no murmur, click, rub or gallop Abdomen: soft, non-tender; bowel sounds normal; no masses,  no organomegaly Extremities: extremities normal, atraumatic, no cyanosis or edema Pulses: 2+ and symmetric Skin: Skin color, texture, turgor normal. No rashes or lesions Lymph nodes: Cervical, supraclavicular, and axillary nodes normal. Neurologic: Grossly normal    Assessment:    Healthy female exam.      Plan:     CPE/depression-PAP performed today and pt aware that she needs one. Will order colonoscopy and mammogram. Gave patient handout for complete physical and recommended at least 150 minutes of exercise a week as well as low-fat diet. In the next year or the foot patient needs to focus on some type of weight loss. Recommended vitamin D 800 units as well as 1200 milligrams of calcium.  PHQ-9 was 20. Pt has hx of depression. She also reports she is in a lot of pain from her fibromyalgia and degenerative joint disease.  Fibromyalgia/degenerative joint disease/chronic pain syndrome/depression- increase Cymbalta to 60mg  daily. Sent pt some compounding cream for joints to try to help with joint pain.   Former smoking- patient reports that she quit smoking in January 2015.    Nausea/vomiting/hypothyroidisim- Last  time pt had n/v her tSh levels were out of normal. Will recheck today. Refilled phenergan. Patient is also on Levaquin she could be irritating her stomach. Patient reports she has to take when necessary because if not she cannot work. We're or to try the cream and see if we can decrease Mobic and hopefully decrease some of the nausea and vomiting. Continue to use Carafate and on. Followup as needed.  Followup in 4-6 weeks to address depression and chronic pain. See After Visit Summary for Counseling Recommendations

## 2013-11-03 NOTE — Patient Instructions (Signed)

## 2013-11-04 ENCOUNTER — Telehealth: Payer: Self-pay | Admitting: *Deleted

## 2013-11-04 ENCOUNTER — Other Ambulatory Visit: Payer: Self-pay | Admitting: Physician Assistant

## 2013-11-04 MED ORDER — AMBULATORY NON FORMULARY MEDICATION: Status: DC

## 2013-11-04 MED ORDER — LEVOTHYROXINE SODIUM 137 MCG PO TABS: 137.0000 ug | ORAL_TABLET | Freq: Every day | ORAL | Status: DC

## 2013-11-05 DIAGNOSIS — G894 Chronic pain syndrome: Secondary | ICD-10-CM | POA: Insufficient documentation

## 2013-11-05 DIAGNOSIS — Z79899 Other long term (current) drug therapy: Secondary | ICD-10-CM | POA: Insufficient documentation

## 2013-11-30 ENCOUNTER — Other Ambulatory Visit: Payer: Self-pay | Admitting: Physician Assistant

## 2013-12-02 ENCOUNTER — Telehealth: Payer: Self-pay

## 2013-12-02 NOTE — Telephone Encounter (Signed)
Ranee states her medication for depression is not working. She has been scheduled for Tuesday for a follow up. Luvenia Starch was full on Friday. She would like [place] to call her tomorrow if there is a cancellation.

## 2013-12-07 ENCOUNTER — Ambulatory Visit: Payer: Medicare Other | Admitting: Physician Assistant

## 2013-12-15 ENCOUNTER — Ambulatory Visit (INDEPENDENT_AMBULATORY_CARE_PROVIDER_SITE_OTHER): Payer: Medicare Other | Admitting: Physician Assistant

## 2013-12-15 ENCOUNTER — Encounter: Payer: Self-pay | Admitting: Physician Assistant

## 2013-12-15 VITALS — BP 135/80 | HR 120

## 2013-12-15 DIAGNOSIS — I498 Other specified cardiac arrhythmias: Secondary | ICD-10-CM

## 2013-12-15 DIAGNOSIS — R000 Tachycardia, unspecified: Secondary | ICD-10-CM

## 2013-12-15 DIAGNOSIS — E559 Vitamin D deficiency, unspecified: Secondary | ICD-10-CM

## 2013-12-15 DIAGNOSIS — E039 Hypothyroidism, unspecified: Secondary | ICD-10-CM

## 2013-12-15 DIAGNOSIS — F339 Major depressive disorder, recurrent, unspecified: Secondary | ICD-10-CM

## 2013-12-15 MED ORDER — DULOXETINE HCL 30 MG PO CPEP: 30.0000 mg | ORAL_CAPSULE | Freq: Every day | ORAL | Status: DC

## 2013-12-15 NOTE — Progress Notes (Signed)
   Subjective:    Patient ID: Aimee Little, female    DOB: [birth year], 53 y.o.   MRN: [id number]  HPI Patient is a 53 year old female who presents to the clinic with worsening depression symptoms. She has done really well for many months but over the past couple months she just feels like she is traveling more and more into a depression. She denies any suicidal or homicidal thoughts. She has a lot of trouble concentrating on anything. She tends to be over eating. She feels like she is a failure and has nothing going for her. She is sleeping a lot and does not want to get out of bed. Most everyday she feels down, depressed and hopeless. She is also has diagnosed fibromyalgia and reports worsening muscle pains and aches. She's taking Cymbalta 60 mg daily. She did not collect this is helping her depression. She is also looking for a job. She recently lost her job she was working 20 hours a week at [employer]. That was the perfect amount of working to help keep her feeling better about herself and also not overwhelming her body. She has multiple job applications in and waiting to hear something bad.    Review of Systems     Objective:   Physical Exam  Constitutional: She is oriented to person, place, and time. She appears well-developed and well-nourished.  HENT:  Head: Normocephalic and atraumatic.  Cardiovascular: Regular rhythm and normal heart sounds.   Rechecked 109 HR.   Pulmonary/Chest: Effort normal and breath sounds normal.  Neurological: She is alert and oriented to person, place, and time.  Skin: Skin is dry.  Psychiatric:  Patient exhibited a lot of flat affect. She did present well put together with makeup and we closed.          Assessment & Plan:  Major depression recurrent/fibromyalgia- PHQ-9 was 23. We discussed switching her medication completely from Cymbalta however patient didn't gets a lot of pain benefit from medication. Today we discussed increasing to 90 mg daily. I did  spend over 30 mg of Cymbalta to To the 60 mg tablets. Patient will continue on Wellbutrin 300 mg daily. Patient would like to try this first before considering a medication switch. We'll check vitamin D levels since on high-dose vitamin D as well as get vitamin D levels at adequate doses can help with fibromyalgia symptoms. Last thyroid level was elevated. We did increase her dose. We will check her thyroid today. Discussed with patient to stay active and try to get herself in the sunlight. Followup if symptoms worsen or in 1 month.   Osteoarthritis/DJD of left knee-patient never got cream that we fax to the pharmacy. Will try to fax medication again to try something other than oral NSAIDs.pt not taking mobic regularly.   Sinus tachycardia-we'll continue to monitor. Rechecked it was 1 and not in office today.   Spent 30 minutes the patient greater than 50% of visit spent counseling patient regarding depression and treatment.

## 2013-12-15 NOTE — Patient Instructions (Signed)
Increase cymbalta to 90mg .  Follow up in 4 weeks.

## 2013-12-16 LAB — VITAMIN D 25 HYDROXY (VIT D DEFICIENCY, FRACTURES): Vit D, 25-Hydroxy: 45 ng/mL (ref 30–89)

## 2013-12-16 LAB — T4, FREE: Free T4: 1.16 ng/dL (ref 0.80–1.80)

## 2013-12-16 LAB — TSH: TSH: 1.989 u[IU]/mL (ref 0.350–4.500)

## 2013-12-17 DIAGNOSIS — F329 Major depressive disorder, single episode, unspecified: Secondary | ICD-10-CM | POA: Insufficient documentation

## 2013-12-17 DIAGNOSIS — F32A Depression, unspecified: Secondary | ICD-10-CM | POA: Insufficient documentation

## 2013-12-17 DIAGNOSIS — F419 Anxiety disorder, unspecified: Secondary | ICD-10-CM | POA: Insufficient documentation

## 2013-12-20 ENCOUNTER — Encounter: Payer: Self-pay | Admitting: Physician Assistant

## 2013-12-20 DIAGNOSIS — D126 Benign neoplasm of colon, unspecified: Secondary | ICD-10-CM | POA: Insufficient documentation

## 2013-12-23 ENCOUNTER — Other Ambulatory Visit: Payer: Self-pay | Admitting: *Deleted

## 2013-12-23 MED ORDER — AMBULATORY NON FORMULARY MEDICATION: Status: DC

## 2013-12-27 ENCOUNTER — Telehealth: Payer: Self-pay | Admitting: *Deleted

## 2013-12-27 NOTE — Telephone Encounter (Signed)
Pt left message letting you know that since adding that new med to her regimen, she is feeling much better emotionally.

## 2014-01-04 ENCOUNTER — Other Ambulatory Visit: Payer: Self-pay | Admitting: Physician Assistant

## 2014-01-07 ENCOUNTER — Ambulatory Visit: Payer: Medicare Other | Admitting: Physician Assistant

## 2014-01-12 ENCOUNTER — Encounter: Payer: Self-pay | Admitting: Physician Assistant

## 2014-01-21 ENCOUNTER — Ambulatory Visit (INDEPENDENT_AMBULATORY_CARE_PROVIDER_SITE_OTHER): Payer: Medicare Other | Admitting: Physician Assistant

## 2014-01-21 ENCOUNTER — Encounter: Payer: Self-pay | Admitting: Physician Assistant

## 2014-01-21 VITALS — BP 134/78 | HR 101 | Ht 70.0 in

## 2014-01-21 DIAGNOSIS — R112 Nausea with vomiting, unspecified: Secondary | ICD-10-CM

## 2014-01-21 DIAGNOSIS — K219 Gastro-esophageal reflux disease without esophagitis: Secondary | ICD-10-CM

## 2014-01-21 MED ORDER — OMEPRAZOLE 40 MG PO CPDR: 40.0000 mg | DELAYED_RELEASE_CAPSULE | Freq: Every day | ORAL | Status: DC

## 2014-01-21 MED ORDER — RANITIDINE HCL 300 MG PO TABS: 300.0000 mg | ORAL_TABLET | Freq: Two times a day (BID) | ORAL | Status: DC

## 2014-01-21 NOTE — Patient Instructions (Addendum)
DO NOT TAKE mobic/NSAIDs. Follow up with Dr. Jessee Avers, Gastroenterologist.

## 2014-01-21 NOTE — Progress Notes (Addendum)
   Subjective:    Patient ID: Aimee Little, female    DOB: 03/29/1961, 53 y.o.   MRN: [id number]  HPI Pt is a 53 yo female who presents to the clinic with ongoing GERD. She is currently not controlled and hasn't been for over 6 months. She daily has nausea and vomiting associated with eating. She reports vomiting at least 3 times a week due to certain foods. Chick fil la grilled chicken sandwiches feel like glass going down. She is on dexilant. She did see GI a couple of years ago and gave her dexilant. Last endoscopy was 2-3 years ago. She uses phenergan regularly due to symptoms. She denies any abdominal pain.  Many foods cause her to have symptoms. Easter Sunday she was miserable after eating green beans and corn. Denies any bowel movement changes or blood in stool. She would like to switch it up from dexilant to omeprazole and try other medications out. Pt occasional takes mobic for pain but not daily.     Review of Systems     Objective:   Physical Exam  Constitutional: She is oriented to person, place, and time. She appears well-developed and well-nourished.  HENT:  Head: Normocephalic and atraumatic.  Cardiovascular: Normal rate, regular rhythm and normal heart sounds.   Pulmonary/Chest: Effort normal and breath sounds normal. She has no wheezes.  Abdominal: Soft. Bowel sounds are normal. She exhibits no distension and no mass. There is no tenderness. There is no rebound and no guarding.  Neurological: She is alert and oriented to person, place, and time.  Skin: Skin is dry.  Psychiatric: She has a normal mood and affect. Her behavior is normal.          Assessment & Plan:  GERD/nausea and vomiting- NO NSAIDs/MObic. discussed with pt that I feel like dexilant is a better choice to be on rather than omeprazole. Nonetheless, we can try switching it up to see if any improvement. Omeprazole 40mg  daily was called into pharmacy with zantac 300mg  bid. i feel like pt needs another  endoscopy and recheck by Dr. Jessee Avers. Pt was instructed to make appt. Follow up as needed or if symptoms worsening. Pt is also overweight and discussed with pt weight loss can also help GERD symptoms. Encouraged pt to watch diet and stay active.

## 2014-01-26 ENCOUNTER — Other Ambulatory Visit: Payer: Self-pay | Admitting: Physician Assistant

## 2014-04-06 ENCOUNTER — Ambulatory Visit (INDEPENDENT_AMBULATORY_CARE_PROVIDER_SITE_OTHER): Payer: Medicare Other | Admitting: Physician Assistant

## 2014-04-06 DIAGNOSIS — F331 Major depressive disorder, recurrent, moderate: Secondary | ICD-10-CM

## 2014-04-06 DIAGNOSIS — IMO0001 Reserved for inherently not codable concepts without codable children: Secondary | ICD-10-CM

## 2014-04-06 DIAGNOSIS — R112 Nausea with vomiting, unspecified: Secondary | ICD-10-CM

## 2014-04-06 DIAGNOSIS — M797 Fibromyalgia: Secondary | ICD-10-CM

## 2014-04-06 DIAGNOSIS — J0100 Acute maxillary sinusitis, unspecified: Secondary | ICD-10-CM

## 2014-04-06 DIAGNOSIS — G43001 Migraine without aura, not intractable, with status migrainosus: Secondary | ICD-10-CM

## 2014-04-06 MED ORDER — METHYLPREDNISOLONE SODIUM SUCC 125 MG IJ SOLR
125.0000 mg | Freq: Once | INTRAMUSCULAR | Status: AC
  Administered 2014-04-06: 125 mg via INTRAMUSCULAR

## 2014-04-06 MED ORDER — PROMETHAZINE HCL 25 MG/ML IJ SOLN
25.0000 mg | Freq: Once | INTRAMUSCULAR | Status: AC
  Administered 2014-04-06: 25 mg via INTRAMUSCULAR

## 2014-04-06 MED ORDER — AMOXICILLIN-POT CLAVULANATE 875-125 MG PO TABS: 1.0000 | ORAL_TABLET | Freq: Two times a day (BID) | ORAL | Status: DC

## 2014-04-06 MED ORDER — KETOROLAC TROMETHAMINE 60 MG/2ML IM SOLN
60.0000 mg | Freq: Once | INTRAMUSCULAR | Status: AC
Start: 2014-04-06 — End: 2014-04-06
  Administered 2014-04-06: 60 mg via INTRAMUSCULAR

## 2014-04-07 ENCOUNTER — Encounter: Payer: Self-pay | Admitting: Physician Assistant

## 2014-04-07 DIAGNOSIS — R112 Nausea with vomiting, unspecified: Secondary | ICD-10-CM | POA: Insufficient documentation

## 2014-04-07 NOTE — Progress Notes (Signed)
   Subjective:    Patient ID: Aimee Little, female    DOB: 01/16/1961, 53 y.o.   MRN: [id number]  HPI Pt is a 53 yo female who presents to the clinic with headache, nausea, vomiting for last 2 days. She is being seen by GI specialist for ongoing nausea and vomiting. They have done colonoscopy, gastric emptying studies and blood work that reveals no causes of nausea and vomiting. They have suggested that nausea and vomiting are due to polypharmacy. She has stopped numerous medication for the last 2 weeks. She is no longer taking wellbutrin, cymbalta, gabapentin, amitiza, phenergan, zanaflex. She was doing a little better except for her fibromyaglia pain which as worsen and acute episode of nausea and vomiting that started 2 days ago. She also has a migraine currently. It has been present for 12 hours. Upon questioning she also admits to sinus pressure and ear pain. No ST, fever, chills, cough, SOB, or wheezing. She does have signifcant facial pain bilaterally.    Review of Systems  All other systems reviewed and are negative.      Objective:   Physical Exam  Constitutional: She is oriented to person, place, and time. She appears well-developed and well-nourished.  HENT:  Head: Normocephalic and atraumatic.  Cardiovascular: Normal rate, regular rhythm and normal heart sounds.   Pulmonary/Chest: Effort normal and breath sounds normal.  Neurological: She is alert and oriented to person, place, and time.  Psychiatric: She has a normal mood and affect. Her behavior is normal.          Assessment & Plan:  Pt declined BP today. She stated the cuff hurts her too bad.   Nausea and vomiting- phenergan IM 25mg  given today. Continue to use antivert orally every 4-6 hours. Certainly polypharmacy could be contributing. Concerned because we also want to try to control pain as well. Perhaps need to try different medication o;ptions after you have completed 6 week wash out period. I feel like nausea  today could be steming from migraine that possibility could have been caused by sinus infection. Follow up in 6wk.   Acute sinusitis/migraine- IM toradoll 60mg  and solumedrol 125mg  given IM today. I feel like sinus infection could be causing migraine that has triggered worsening nausea and vomiting. augmentin for 10 days was given. Continue to use flonase as needed for nasal congestion. Follow up as needed.   Chronic pain/fibromyagia- pt does not want to go to pain management. Currently not on medications to control fibromyaglia pain. Not able to tolerate lyrica and gabapentin we are washing out the others to see if could be causing nausea and vomiting. Follow up in 6 wks.

## 2014-04-07 NOTE — Patient Instructions (Signed)
Sinusitis Sinusitis is redness, soreness, and swelling (inflammation) of the paranasal sinuses. Paranasal sinuses are air pockets within the bones of your face (beneath the eyes, the middle of the forehead, or above the eyes). In healthy paranasal sinuses, mucus is able to drain out, and air is able to circulate through them by way of your nose. However, when your paranasal sinuses are inflamed, mucus and air can become trapped. This can allow bacteria and other germs to grow and cause infection. Sinusitis can develop quickly and last only a short time (acute) or continue over a long period (chronic). Sinusitis that lasts for more than 12 weeks is considered chronic.  CAUSES  Causes of sinusitis include:  Allergies.  Structural abnormalities, such as displacement of the cartilage that separates your nostrils (deviated septum), which can decrease the air flow through your nose and sinuses and affect sinus drainage.  Functional abnormalities, such as when the small hairs (cilia) that line your sinuses and help remove mucus do not work properly or are not present. SYMPTOMS  Symptoms of acute and chronic sinusitis are the same. The primary symptoms are pain and pressure around the affected sinuses. Other symptoms include:  Upper toothache.  Earache.  Headache.  Bad breath.  Decreased sense of smell and taste.  A cough, which worsens when you are lying flat.  Fatigue.  Fever.  Thick drainage from your nose, which often is green and may contain pus (purulent).  Swelling and warmth over the affected sinuses. DIAGNOSIS  Your caregiver will perform a physical exam. During the exam, your caregiver may:  Look in your nose for signs of abnormal growths in your nostrils (nasal polyps).  Tap over the affected sinus to check for signs of infection.  View the inside of your sinuses (endoscopy) with a special imaging device with a light attached (endoscope), which is inserted into your  sinuses. If your caregiver suspects that you have chronic sinusitis, one or more of the following tests may be recommended:  Allergy tests.  Nasal culture--A sample of mucus is taken from your nose and sent to a lab and screened for bacteria.  Nasal cytology--A sample of mucus is taken from your nose and examined by your caregiver to determine if your sinusitis is related to an allergy. TREATMENT  Most cases of acute sinusitis are related to a viral infection and will resolve on their own within 10 days. Sometimes medicines are prescribed to help relieve symptoms (pain medicine, decongestants, nasal steroid sprays, or saline sprays).  However, for sinusitis related to a bacterial infection, your caregiver will prescribe antibiotic medicines. These are medicines that will help kill the bacteria causing the infection.  Rarely, sinusitis is caused by a fungal infection. In theses cases, your caregiver will prescribe antifungal medicine. For some cases of chronic sinusitis, surgery is needed. Generally, these are cases in which sinusitis recurs more than 3 times per year, despite other treatments. HOME CARE INSTRUCTIONS   Drink plenty of water. Water helps thin the mucus so your sinuses can drain more easily.  Use a humidifier.  Inhale steam 3 to 4 times a day (for example, sit in the bathroom with the shower running).  Apply a warm, moist washcloth to your face 3 to 4 times a day, or as directed by your caregiver.  Use saline nasal sprays to help moisten and clean your sinuses.  Take over-the-counter or prescription medicines for pain, discomfort, or fever only as directed by your caregiver. SEEK IMMEDIATE MEDICAL CARE IF:    You have increasing pain or severe headaches.  You have nausea, vomiting, or drowsiness.  You have swelling around your face.  You have vision problems.  You have a stiff neck.  You have difficulty breathing. MAKE SURE YOU:   Understand these  instructions.  Will watch your condition.  Will get help right away if you are not doing well or get worse. Document Released: 09/16/2005 Document Revised: 12/09/2011 Document Reviewed: 10/01/2011 Exit[employer] Patient Information 2015 [employer]. This information is not intended to replace advice given to you by your health care provider. Make sure you discuss any questions you have with your health care provider.  

## 2014-05-04 ENCOUNTER — Other Ambulatory Visit: Payer: Self-pay | Admitting: Physician Assistant

## 2014-05-11 ENCOUNTER — Telehealth: Payer: Self-pay | Admitting: *Deleted

## 2014-05-11 NOTE — Telephone Encounter (Signed)
Pt left vm wanting you to know that her nausea is much better.  She stated that she has stopped most of her meds and she thinks that is the reason she feels so much better.  She states she still take her restless leg medicine regularly but the other day her back was hurting and she took a xanaflex, her RLS med, & her "celebrex like med" (mobic?) and was sick as a dog so she thinks it's those 2 meds that were making her so sick.  Just FYI.

## 2014-05-11 NOTE — Telephone Encounter (Signed)
Ok try them separately to see if both on just one so we know which one to put on intolerance list. I am glad you are feeling better.

## 2014-05-25 ENCOUNTER — Ambulatory Visit: Payer: Medicare Other | Admitting: Physician Assistant

## 2014-06-02 ENCOUNTER — Telehealth: Payer: Self-pay | Admitting: *Deleted

## 2014-06-02 ENCOUNTER — Other Ambulatory Visit: Payer: Self-pay | Admitting: Physician Assistant

## 2014-06-02 NOTE — Telephone Encounter (Signed)
Tramadol script denied. This was wrote in Dec 2014 and has not been seen for back problems since then. Called and lm that she needed an appt for re-evaluation. Margette Fast, CMA

## 2014-06-08 ENCOUNTER — Ambulatory Visit (INDEPENDENT_AMBULATORY_CARE_PROVIDER_SITE_OTHER): Payer: Medicare Other

## 2014-06-08 ENCOUNTER — Other Ambulatory Visit: Payer: Self-pay | Admitting: Physician Assistant

## 2014-06-08 ENCOUNTER — Ambulatory Visit (INDEPENDENT_AMBULATORY_CARE_PROVIDER_SITE_OTHER): Payer: Medicare Other | Admitting: Physician Assistant

## 2014-06-08 VITALS — BP 128/88 | HR 90 | Ht 70.0 in

## 2014-06-08 DIAGNOSIS — M549 Dorsalgia, unspecified: Secondary | ICD-10-CM

## 2014-06-08 DIAGNOSIS — R112 Nausea with vomiting, unspecified: Secondary | ICD-10-CM

## 2014-06-08 DIAGNOSIS — M797 Fibromyalgia: Secondary | ICD-10-CM

## 2014-06-08 DIAGNOSIS — IMO0001 Reserved for inherently not codable concepts without codable children: Secondary | ICD-10-CM

## 2014-06-08 DIAGNOSIS — E039 Hypothyroidism, unspecified: Secondary | ICD-10-CM

## 2014-06-08 DIAGNOSIS — R062 Wheezing: Secondary | ICD-10-CM

## 2014-06-08 DIAGNOSIS — G894 Chronic pain syndrome: Secondary | ICD-10-CM

## 2014-06-08 DIAGNOSIS — Z23 Encounter for immunization: Secondary | ICD-10-CM

## 2014-06-08 DIAGNOSIS — R079 Chest pain, unspecified: Secondary | ICD-10-CM

## 2014-06-08 MED ORDER — DOXYCYCLINE HYCLATE 100 MG PO TABS: 100.0000 mg | ORAL_TABLET | Freq: Two times a day (BID) | ORAL | Status: DC

## 2014-06-08 MED ORDER — TRAMADOL HCL 50 MG PO TABS: ORAL_TABLET | ORAL | Status: DC

## 2014-06-08 MED ORDER — DULOXETINE HCL 60 MG PO CPEP: 60.0000 mg | ORAL_CAPSULE | Freq: Every day | ORAL | Status: DC

## 2014-06-08 NOTE — Progress Notes (Signed)
   Subjective:    Patient ID: Collier Bullock, female    DOB: 01/24/1961, 53 y.o.   MRN: [id number]  HPI Pt presents to the clinic for medication for back pain. She was originally on tramadol and did really well. Took a lot of medications away while trying to figure out what could be causing her nausea and vomiting. She is on cymbalta but would like to add tramadol back on. Has started to walk daily and be more active. She is determine to be healthy again.   Nausea- doing great right now.  Hypothyroidism- doing well. Not checked in while after dose change.      Review of Systems  All other systems reviewed and are negative.      Objective:   Physical Exam  Constitutional: She is oriented to person, place, and time. She appears well-developed and well-nourished.  HENT:  Head: Normocephalic and atraumatic.  Right Ear: External ear normal.  Left Ear: External ear normal.  Nose: Nose normal.  Mouth/Throat: Oropharynx is clear and moist. No oropharyngeal exudate.  Eyes: Conjunctivae are normal. Right eye exhibits no discharge. Left eye exhibits no discharge.  Neck: Normal range of motion. Neck supple.  Cardiovascular: Normal rate, regular rhythm and normal heart sounds.   Pulmonary/Chest:  Wheezing and coarse breath sounds heard over left lower lung.   Lymphadenopathy:    She has no cervical adenopathy.  Neurological: She is alert and oriented to person, place, and time.  Skin: Skin is dry.  Psychiatric: She has a normal mood and affect. Her behavior is normal.          Assessment & Plan:  Nausea- much improved after taking away her meds and slowly adding back in. Believes that mirapex was the main medication causes nausea and vomiting.   Chronic pain/fibromyaglia- started back tramadol as needed. Please stop if nausea occurs. Continue on cymbalta and mobic. Continue walking and staying active.   Wheezing on exam- CXR shows signs of bronchitis. Will get CBC. Will treat with  doxycycline for 10 days.   Hypothyroidism- will recheck levels and adjust medication accordingly.    Flu shot given without complications.

## 2014-06-09 ENCOUNTER — Telehealth: Payer: Self-pay | Admitting: *Deleted

## 2014-06-09 LAB — COMPLETE METABOLIC PANEL WITH GFR
ALBUMIN: 3.9 g/dL (ref 3.5–5.2)
ALK PHOS: 95 U/L (ref 39–117)
ALT: 30 U/L (ref 0–35)
AST: 26 U/L (ref 0–37)
BUN: 17 mg/dL (ref 6–23)
CHLORIDE: 110 meq/L (ref 96–112)
CO2: 22 mEq/L (ref 19–32)
Calcium: 9.1 mg/dL (ref 8.4–10.5)
Creat: 0.8 mg/dL (ref 0.50–1.10)
GFR, Est African American: >89 mL/min
GFR, Est Non African American: 84 mL/min
Glucose, Bld: 82 mg/dL (ref 70–99)
Potassium: 4 mEq/L (ref 3.5–5.3)
Sodium: 139 mEq/L (ref 135–145)
TOTAL PROTEIN: 6.2 g/dL (ref 6.0–8.3)
Total Bilirubin: 0.4 mg/dL (ref 0.2–1.2)

## 2014-06-09 LAB — CBC WITH DIFFERENTIAL/PLATELET
BASOS ABS: 0 10*3/uL (ref 0.0–0.1)
Basophils Relative: 0 % (ref 0–1)
EOS ABS: 0.2 10*3/uL (ref 0.0–0.7)
EOS PCT: 2 % (ref 0–5)
HEMATOCRIT: 41.4 % (ref 36.0–46.0)
Hemoglobin: 14.6 g/dL (ref 12.0–15.0)
Lymphocytes Relative: 29 % (ref 12–46)
Lymphs Abs: 2.5 10*3/uL (ref 0.7–4.0)
MCH: 31.7 pg (ref 26.0–34.0)
MCHC: 35.3 g/dL (ref 30.0–36.0)
MCV: 90 fL (ref 78.0–100.0)
MONO ABS: 0.8 10*3/uL (ref 0.1–1.0)
Monocytes Relative: 9 % (ref 3–12)
Neutro Abs: 5.1 10*3/uL (ref 1.7–7.7)
Neutrophils Relative %: 60 % (ref 43–77)
Platelets: 245 10*3/uL (ref 150–400)
RBC: 4.6 MIL/uL (ref 3.87–5.11)
RDW: 13.9 % (ref 11.5–15.5)
WBC: 8.5 10*3/uL (ref 4.0–10.5)

## 2014-06-09 LAB — TSH: TSH: 0.566 u[IU]/mL (ref 0.350–4.500)

## 2014-06-09 LAB — T4, FREE: FREE T4: 1.19 ng/dL (ref 0.80–1.80)

## 2014-06-09 NOTE — Telephone Encounter (Signed)
respiclick albuterol. 2 puffs as needed up to every 4-6 hours.

## 2014-06-09 NOTE — Telephone Encounter (Signed)
Pt notified & sample placed at the front desk for pick up.

## 2014-06-09 NOTE — Telephone Encounter (Signed)
Pt states that you were going to give her an inhaler yesterday but she walked out & forgot.  She wants to know if you could just send it in to her pharmacy & she'll just pick it up along with her abx.

## 2014-06-09 NOTE — Telephone Encounter (Signed)
Never mind on sending the inhaler to her pharm..she will take a sample.  I just need to know what to give her.

## 2014-06-10 ENCOUNTER — Encounter: Payer: Self-pay | Admitting: Physician Assistant

## 2014-06-16 ENCOUNTER — Other Ambulatory Visit: Payer: Self-pay | Admitting: Physician Assistant

## 2014-06-17 ENCOUNTER — Telehealth: Payer: Self-pay | Admitting: *Deleted

## 2014-06-17 ENCOUNTER — Other Ambulatory Visit: Payer: Self-pay | Admitting: *Deleted

## 2014-06-17 MED ORDER — ONDANSETRON HCL 4 MG PO TABS: 4.0000 mg | ORAL_TABLET | Freq: Three times a day (TID) | ORAL | Status: DC | PRN

## 2014-06-17 NOTE — Telephone Encounter (Signed)
Ok with zofran #20 1 po q-8 hours. Make sure addition of tramadol is not causing this nausea as well. It was added back on to med list.

## 2014-06-17 NOTE — Telephone Encounter (Signed)
rx sent  Pt notified. 

## 2014-06-17 NOTE — Telephone Encounter (Signed)
Pt left vm stating that she is having some nausea with her abx & wants to know if you could send zofran to [facility].  Please advise.

## 2014-07-18 ENCOUNTER — Telehealth: Payer: Self-pay | Admitting: *Deleted

## 2014-07-18 NOTE — Telephone Encounter (Signed)
I would really prefer not to have you on cymbalta if causing nausea. As cymbalta builds up into system can cause more nausea and we spent a lot of time trying to elminate nausea. What about decreasing cymbalta to 30mg  instead of 60mg . Or we could consider changing rx all together.

## 2014-07-18 NOTE — Telephone Encounter (Signed)
Pt left vm that she has started back on the cymbalta and is only slightly nauseated and that it's not everyday.  She states that on the days she doesn't feel good that she only has to take 1 tab of her zofran and she will feel a lot better.  She's wanting to know if you could send more than #20 of zofran in for her.

## 2014-07-19 NOTE — Telephone Encounter (Signed)
Pt said she really likes the benefits of the cymbalta but would be willing to try the wellbutrin again that she took in the past to stop smoking.

## 2014-07-21 NOTE — Telephone Encounter (Signed)
I am concerned wellbutrin will not give you the extra pain relief from fibromyaglia. Have you tried fetizema?

## 2014-07-22 NOTE — Telephone Encounter (Signed)
Left message for patient to return call.

## 2014-07-30 ENCOUNTER — Other Ambulatory Visit: Payer: Self-pay | Admitting: Physician Assistant

## 2014-08-01 ENCOUNTER — Other Ambulatory Visit: Payer: Self-pay | Admitting: Physician Assistant

## 2014-08-02 ENCOUNTER — Telehealth: Payer: Self-pay | Admitting: *Deleted

## 2014-08-02 NOTE — Telephone Encounter (Signed)
Pt left vm stating that her & Luvenia Starch have discussed her going on topamax for her HAs.  She told Luvenia Starch that it didn't really help her in the past.  She stated on the vm that she was taking a lot of other meds at the time and was wondering if that's why it maybe wasn't as affective.  She is considering trying it again. I didn't see it on her historical med list.  Do you think she needs to see Luvenia Starch first or can we send her some in? Please advise.

## 2014-08-03 MED ORDER — TOPIRAMATE 25 MG PO TABS: ORAL_TABLET | ORAL | Status: DC

## 2014-08-03 NOTE — Telephone Encounter (Signed)
We can go ahead and restart the Topamax. Recommend she schedule an appointment with [name] in about 3-4 weeks so they can adjust the dose before her next refill.

## 2014-08-04 NOTE — Telephone Encounter (Signed)
Pt notified of rx. 

## 2014-08-08 ENCOUNTER — Ambulatory Visit (INDEPENDENT_AMBULATORY_CARE_PROVIDER_SITE_OTHER): Payer: Medicare Other | Admitting: Family Medicine

## 2014-08-08 ENCOUNTER — Encounter: Payer: Self-pay | Admitting: Family Medicine

## 2014-08-08 VITALS — BP 140/80 | Temp 98.2°F | Wt 252.0 lb

## 2014-08-08 DIAGNOSIS — A499 Bacterial infection, unspecified: Secondary | ICD-10-CM

## 2014-08-08 DIAGNOSIS — J329 Chronic sinusitis, unspecified: Secondary | ICD-10-CM

## 2014-08-08 DIAGNOSIS — B9689 Other specified bacterial agents as the cause of diseases classified elsewhere: Secondary | ICD-10-CM

## 2014-08-08 MED ORDER — DOXYCYCLINE HYCLATE 100 MG PO TABS: ORAL_TABLET | ORAL | Status: AC

## 2014-08-08 MED ORDER — FLUCONAZOLE 150 MG PO TABS: ORAL_TABLET | ORAL | Status: DC

## 2014-08-08 MED ORDER — FLUTICASONE PROPIONATE 50 MCG/ACT NA SUSP: NASAL | Status: DC

## 2014-08-08 NOTE — Progress Notes (Signed)
CC: Aimee Little is a 53 y.o. female is here for Nasal Congestion   Subjective: HPI:  Complains of nasal congestion and facial pressure localized in the forehead that has been worsening on a daily basis for the past week. No benefit from over-the-counter sinus medications. Symptoms are present all hours of the day. Overall symptoms are moderate in severity. The covered by subjective chills but no fevers. Denies any motor sensory disturbances.  Denies cough, shortness of breath, wheezing, chest pain nor skin changes   Review Of Systems Outlined In HPI  Past Medical History  Diagnosis Date  . Thyroid disease   . Depression   . Insomnia   . Fibromyalgia   . DDD (degenerative disc disease), lumbar     Past Surgical History  Procedure Laterality Date  . Abdominal hysterectomy      took uterus and both ovaries left   Family History  Problem Relation Age of Onset  . Depression Mother   . Hyperlipidemia Mother   . Hypertension Mother   . Heart attack Father   . Hypertension Sister   . Diabetes Sister   . Alcohol abuse Brother   . Hypertension Brother     History   Social History  . Marital Status: Married    Spouse Name: N/A    Number of Children: N/A  . Years of Education: N/A   Occupational History  . Not on file.   Social History Main Topics  . Smoking status: Former Smoker -- 0.50 packs/day    Quit date: 10/02/2013  . Smokeless tobacco: Not on file  . Alcohol Use: Not on file  . Drug Use: Not on file  . Sexual Activity: Not on file   Other Topics Concern  . Not on file   Social History Narrative     Objective: BP 140/80 mmHg  Temp(Src) 98.2 F (36.8 C) (Oral)  Wt 252 lb (114.306 kg)  SpO2 96%  General: Alert and Oriented, No Acute Distress HEENT: Pupils equal, round, reactive to light. Conjunctivae clear.  External ears unremarkable, canals clear with intact TMs with appropriate landmarks.  Middle ear appears open without effusion. Pink inferior  turbinates.  Moist mucous membranes, pharynx without inflammation nor lesions however moderate postnasal drip and cobblestoning.  Neck supple without palpable lymphadenopathy nor abnormal masses. Lungs: Clear to auscultation bilaterally, no wheezing/ronchi/rales.  Comfortable work of breathing. Good air movement. Extremities: No peripheral edema.  Strong peripheral pulses.  Mental Status: No depression, anxiety, nor agitation. Skin: Warm and dry.  Assessment & Plan: Aimee Little was seen today for nasal congestion.  Diagnoses and associated orders for this visit:  Bacterial sinusitis - doxycycline (VIBRA-TABS) 100 MG tablet; One by mouth twice a day for ten days. - fluconazole (DIFLUCAN) 150 MG tablet; Take one tab, may take second tab if no improvement after 72 hours. - fluticasone (FLONASE) 50 MCG/ACT nasal spray; USE 1 TO 2 SPRAYS IN EACH NOSTRIL ONCE DAILY.    Bacterial sinusitis: Start doxycycline,she usually gets yeast infections with antibiotics therefore fluconazole was provided. Refills provided for Flonase. Consider nasal saline washes for sinus pressure pain control.  Return if symptoms worsen or fail to improve.

## 2014-08-20 ENCOUNTER — Other Ambulatory Visit: Payer: Self-pay | Admitting: Physician Assistant

## 2014-08-22 ENCOUNTER — Ambulatory Visit: Payer: Medicare Other | Admitting: Physician Assistant

## 2014-08-24 ENCOUNTER — Encounter: Payer: Self-pay | Admitting: Physician Assistant

## 2014-08-24 ENCOUNTER — Ambulatory Visit (INDEPENDENT_AMBULATORY_CARE_PROVIDER_SITE_OTHER): Payer: Medicare Other | Admitting: Physician Assistant

## 2014-08-24 VITALS — BP 140/80 | Wt 249.0 lb

## 2014-08-24 DIAGNOSIS — G629 Polyneuropathy, unspecified: Secondary | ICD-10-CM

## 2014-08-24 DIAGNOSIS — G4700 Insomnia, unspecified: Secondary | ICD-10-CM

## 2014-08-24 DIAGNOSIS — M5137 Other intervertebral disc degeneration, lumbosacral region: Secondary | ICD-10-CM

## 2014-08-24 MED ORDER — ONDANSETRON HCL 4 MG PO TABS: 4.0000 mg | ORAL_TABLET | Freq: Three times a day (TID) | ORAL | Status: DC | PRN

## 2014-08-24 MED ORDER — TRAZODONE HCL 50 MG PO TABS: 25.0000 mg | ORAL_TABLET | Freq: Every evening | ORAL | Status: DC | PRN

## 2014-08-24 MED ORDER — GABAPENTIN 300 MG PO CAPS: 300.0000 mg | ORAL_CAPSULE | Freq: Three times a day (TID) | ORAL | Status: DC

## 2014-08-27 ENCOUNTER — Other Ambulatory Visit: Payer: Self-pay | Admitting: Physician Assistant

## 2014-08-28 NOTE — Progress Notes (Signed)
   Subjective:    Patient ID: Aimee Little, female    DOB: [birth year], 53 y.o.   MRN: [id number]  HPI Pt comes to the clinic to be started back on gabapentin. She has a hx of peripheral neuropathy due to DDD of lumbar spine. She has gabapentin listed as allergy however she believes that gapapentin alone did not cause sleep walking it was in combination with other medications. She would like to try again. Bilateral feet are present with numbness and tingling with pain that seems to be worsening.   Pt still having a lot of problems sleeping. Melatonin does not work. Problem is getting to sleep. Once she gets asleep she generally can stay asleep.    Review of Systems  All other systems reviewed and are negative.      Objective:   Physical Exam  Constitutional: She is oriented to person, place, and time. She appears well-developed and well-nourished.  HENT:  Head: Normocephalic and atraumatic.  Cardiovascular: Normal rate, regular rhythm and normal heart sounds.   Pulmonary/Chest: Effort normal and breath sounds normal.  Neurological: She is alert and oriented to person, place, and time.  Skin: Skin is dry.  Psychiatric: She has a normal mood and affect. Her behavior is normal.          Assessment & Plan:  Peripheral neuropathy-  mircofilament testing done with some areas of abnormalities.  Restarted gabapentin pt aware of side effects. Suggested to start lower than was before and only advance if needed.  Pt had severe allergy to lyrica and could not take.  Discussed to follow up with ortho about treatment for DDD of lumbar spine with possible epidural injection. She has had before and they help a lot.   Insomnia- try trazodone at bedtime.

## 2014-08-30 ENCOUNTER — Other Ambulatory Visit: Payer: Self-pay | Admitting: Physician Assistant

## 2014-08-31 ENCOUNTER — Ambulatory Visit (INDEPENDENT_AMBULATORY_CARE_PROVIDER_SITE_OTHER): Payer: Medicare Other | Admitting: Physician Assistant

## 2014-08-31 ENCOUNTER — Encounter: Payer: Self-pay | Admitting: Physician Assistant

## 2014-08-31 VITALS — BP 141/80 | HR 87 | Ht 70.0 in | Wt 252.0 lb

## 2014-08-31 DIAGNOSIS — M797 Fibromyalgia: Secondary | ICD-10-CM

## 2014-08-31 MED ORDER — BACLOFEN 10 MG PO TABS: 10.0000 mg | ORAL_TABLET | Freq: Three times a day (TID) | ORAL | Status: DC

## 2014-08-31 MED ORDER — METHYLPREDNISOLONE SODIUM SUCC 125 MG IJ SOLR
125.0000 mg | Freq: Once | INTRAMUSCULAR | Status: AC
  Administered 2014-08-31: 125 mg via INTRAMUSCULAR

## 2014-08-31 MED ORDER — TRAMADOL HCL 50 MG PO TABS: ORAL_TABLET | ORAL | Status: DC

## 2014-08-31 MED ORDER — KETOROLAC TROMETHAMINE 30 MG/ML IJ SOLN
30.0000 mg | Freq: Once | INTRAMUSCULAR | Status: AC
  Administered 2014-08-31: 30 mg via INTRAMUSCULAR

## 2014-08-31 NOTE — Patient Instructions (Signed)
Baclofen added for bedtime.  Tramadol refilled.

## 2014-09-02 NOTE — Progress Notes (Signed)
   Subjective:    Patient ID: Aimee Little, female    DOB: May 04, 1961, 53 y.o.   MRN: [id number]  HPI Pt presents to the clinic with fibromylagia flare. Per pt since weather got cold she is been very achy. She also has a headache that tends to happen with flare. Tramadol helps a lot. She remains stable on mobic/cymbalta for ongoing pain.    Review of Systems  All other systems reviewed and are negative.      Objective:   Physical Exam  Constitutional: She is oriented to person, place, and time. She appears well-developed and well-nourished.  HENT:  Head: Normocephalic and atraumatic.  Cardiovascular: Normal rate, regular rhythm and normal heart sounds.   Pulmonary/Chest: Effort normal and breath sounds normal.  Neurological: She is alert and oriented to person, place, and time.  Psychiatric: She has a normal mood and affect. Her behavior is normal.          Assessment & Plan:  Fibromyalgia/chronic pain syndrome- solumedrol 125mg   And toradol 30mg  IM. Refilled tramadol for pain. Baclofen 10mg  at bedtime. Continue on cymbalta. Follow up as needed.

## 2014-09-19 ENCOUNTER — Other Ambulatory Visit: Payer: Self-pay | Admitting: Family Medicine

## 2014-09-19 ENCOUNTER — Other Ambulatory Visit: Payer: Self-pay | Admitting: Physician Assistant

## 2014-09-19 NOTE — Telephone Encounter (Signed)
Yes do that please.

## 2014-09-19 NOTE — Telephone Encounter (Signed)
I sent a refill in for patient but she needs to have a tsh level done this was for a f/u level check in September. Can I order this and call patient.

## 2014-10-05 ENCOUNTER — Other Ambulatory Visit: Payer: Self-pay | Admitting: Physician Assistant

## 2014-10-17 ENCOUNTER — Ambulatory Visit (INDEPENDENT_AMBULATORY_CARE_PROVIDER_SITE_OTHER): Payer: Medicare Other | Admitting: Physician Assistant

## 2014-10-17 ENCOUNTER — Encounter: Payer: Self-pay | Admitting: Physician Assistant

## 2014-10-17 VITALS — BP 137/76 | HR 80 | Temp 97.8°F | Ht 70.0 in | Wt 238.0 lb

## 2014-10-17 DIAGNOSIS — E039 Hypothyroidism, unspecified: Secondary | ICD-10-CM

## 2014-10-17 DIAGNOSIS — Z1239 Encounter for other screening for malignant neoplasm of breast: Secondary | ICD-10-CM

## 2014-10-17 DIAGNOSIS — Z0000 Encounter for general adult medical examination without abnormal findings: Secondary | ICD-10-CM

## 2014-10-17 DIAGNOSIS — Z79899 Other long term (current) drug therapy: Secondary | ICD-10-CM | POA: Diagnosis not present

## 2014-10-17 DIAGNOSIS — G629 Polyneuropathy, unspecified: Secondary | ICD-10-CM

## 2014-10-17 DIAGNOSIS — M5441 Lumbago with sciatica, right side: Secondary | ICD-10-CM

## 2014-10-17 DIAGNOSIS — M51369 Other intervertebral disc degeneration, lumbar region without mention of lumbar back pain or lower extremity pain: Secondary | ICD-10-CM

## 2014-10-17 DIAGNOSIS — Z131 Encounter for screening for diabetes mellitus: Secondary | ICD-10-CM

## 2014-10-17 DIAGNOSIS — M5442 Lumbago with sciatica, left side: Secondary | ICD-10-CM | POA: Diagnosis not present

## 2014-10-17 DIAGNOSIS — E785 Hyperlipidemia, unspecified: Secondary | ICD-10-CM

## 2014-10-17 DIAGNOSIS — M5136 Other intervertebral disc degeneration, lumbar region: Secondary | ICD-10-CM

## 2014-10-17 DIAGNOSIS — E559 Vitamin D deficiency, unspecified: Secondary | ICD-10-CM | POA: Diagnosis not present

## 2014-10-17 LAB — TSH: TSH: 2.157 u[IU]/mL (ref 0.350–4.500)

## 2014-10-17 LAB — COMPLETE METABOLIC PANEL WITH GFR
ALBUMIN: 4.2 g/dL (ref 3.5–5.2)
ALK PHOS: 91 U/L (ref 39–117)
ALT: 19 U/L (ref 0–35)
AST: 19 U/L (ref 0–37)
BUN: 15 mg/dL (ref 6–23)
CO2: 21 mEq/L (ref 19–32)
Calcium: 9.3 mg/dL (ref 8.4–10.5)
Chloride: 109 mEq/L (ref 96–112)
Creat: 0.78 mg/dL (ref 0.50–1.10)
GFR, Est African American: >89 mL/min
GFR, Est Non African American: 86 mL/min
Glucose, Bld: 79 mg/dL (ref 70–99)
POTASSIUM: 3.8 meq/L (ref 3.5–5.3)
Sodium: 139 mEq/L (ref 135–145)
Total Bilirubin: 0.6 mg/dL (ref 0.2–1.2)
Total Protein: 6.6 g/dL (ref 6.0–8.3)

## 2014-10-17 LAB — LIPID PANEL
CHOL/HDL RATIO: 3.5 ratio
Cholesterol: 163 mg/dL (ref 0–200)
HDL: 47 mg/dL (ref >39)
LDL CALC: 98 mg/dL (ref 0–99)
Triglycerides: 91 mg/dL (ref <150)
VLDL: 18 mg/dL (ref 0–40)

## 2014-10-17 LAB — VITAMIN B12: VITAMIN B 12: 373 pg/mL (ref 211–911)

## 2014-10-17 LAB — T4, FREE: FREE T4: 1.26 ng/dL (ref 0.80–1.80)

## 2014-10-17 MED ORDER — METHYLPREDNISOLONE SODIUM SUCC 125 MG IJ SOLR
125.0000 mg | Freq: Once | INTRAMUSCULAR | Status: AC
  Administered 2014-10-17: 125 mg via INTRAVENOUS

## 2014-10-17 MED ORDER — KETOROLAC TROMETHAMINE 60 MG/2ML IM SOLN
60.0000 mg | Freq: Once | INTRAMUSCULAR | Status: AC
  Administered 2014-10-17: 60 mg via INTRAMUSCULAR

## 2014-10-17 NOTE — Patient Instructions (Addendum)
Will make mammogram appt.  CPE in February.

## 2014-10-17 NOTE — Progress Notes (Signed)
   Subjective:    Patient ID: Aimee Little, female    DOB: 1961-07-16, 54 y.o.   MRN: [id number]  HPI  Pt presents to the clinic for follow up.   DDD, lumbar- seems to be getting worse. [facility] they have given her epidural injections in past and she wonders if good idea today. In the past given her relief for at least 6 months. She has been walking and eating better and total lost 31 pounds. She is getting some sciatica radiating pain down both legs but right may seem a little worse. Baclofen is helping. She hesistant to use mobic daily. Tramadol does help as well. Tried PT a while back but did not feel like it helped. No new back symptoms with saddle anthesthesia or bowel or bladder dysfunction.   She does want to get labs today.     Review of Systems  All other systems reviewed and are negative.      Objective:   Physical Exam  Constitutional: She appears well-developed and well-nourished.  HENT:  Head: Normocephalic and atraumatic.  Cardiovascular: Normal rate, regular rhythm and normal heart sounds.   Pulmonary/Chest: Effort normal and breath sounds normal.          Assessment & Plan:  Lumbar DDD- discussed need to follow up with pain clinic and get another epidural. Use mobic as needed. Baclofen at bedtime. Tramadol for acute pain.   Fasting labs given for CPE. Needs to make appt.  Mammogram ordered today.

## 2014-10-18 ENCOUNTER — Other Ambulatory Visit: Payer: Self-pay | Admitting: Physician Assistant

## 2014-10-18 LAB — VITAMIN D 25 HYDROXY (VIT D DEFICIENCY, FRACTURES): Vit D, 25-Hydroxy: 18 ng/mL — ABNORMAL LOW (ref 30–100)

## 2014-10-18 MED ORDER — VITAMIN D (ERGOCALCIFEROL) 1.25 MG (50000 UNIT) PO CAPS: 50000.0000 [IU] | ORAL_CAPSULE | ORAL | Status: DC

## 2014-10-19 ENCOUNTER — Other Ambulatory Visit: Payer: Self-pay | Admitting: Physician Assistant

## 2014-11-01 ENCOUNTER — Other Ambulatory Visit: Payer: Self-pay | Admitting: Physician Assistant

## 2014-11-01 DIAGNOSIS — Z1239 Encounter for other screening for malignant neoplasm of breast: Secondary | ICD-10-CM

## 2014-11-03 DIAGNOSIS — M5417 Radiculopathy, lumbosacral region: Secondary | ICD-10-CM | POA: Diagnosis not present

## 2014-11-03 DIAGNOSIS — F329 Major depressive disorder, single episode, unspecified: Secondary | ICD-10-CM | POA: Diagnosis not present

## 2014-11-03 DIAGNOSIS — E039 Hypothyroidism, unspecified: Secondary | ICD-10-CM | POA: Diagnosis not present

## 2014-11-03 DIAGNOSIS — M797 Fibromyalgia: Secondary | ICD-10-CM | POA: Diagnosis not present

## 2014-11-03 DIAGNOSIS — M47817 Spondylosis without myelopathy or radiculopathy, lumbosacral region: Secondary | ICD-10-CM | POA: Diagnosis not present

## 2014-11-03 DIAGNOSIS — K219 Gastro-esophageal reflux disease without esophagitis: Secondary | ICD-10-CM | POA: Diagnosis not present

## 2014-11-03 DIAGNOSIS — M545 Low back pain: Secondary | ICD-10-CM | POA: Diagnosis not present

## 2014-11-03 DIAGNOSIS — Z888 Allergy status to other drugs, medicaments and biological substances status: Secondary | ICD-10-CM | POA: Diagnosis not present

## 2014-11-08 ENCOUNTER — Other Ambulatory Visit: Payer: Self-pay | Admitting: Physician Assistant

## 2014-11-08 ENCOUNTER — Telehealth: Payer: Self-pay | Admitting: *Deleted

## 2014-11-08 NOTE — Telephone Encounter (Signed)
Pt called wanting to know if you would consider increasing her topamax & trazodone.  She stated that she has taken up to 200mg  on both before.  She states that 2 tabs of the trazodone work pretty good but wanted to know if you'd increase that to at least 125mg .

## 2014-11-09 ENCOUNTER — Ambulatory Visit (INDEPENDENT_AMBULATORY_CARE_PROVIDER_SITE_OTHER): Payer: Medicare Other

## 2014-11-09 DIAGNOSIS — Z1231 Encounter for screening mammogram for malignant neoplasm of breast: Secondary | ICD-10-CM | POA: Diagnosis not present

## 2014-11-09 DIAGNOSIS — Z1239 Encounter for other screening for malignant neoplasm of breast: Secondary | ICD-10-CM

## 2014-11-09 NOTE — Telephone Encounter (Signed)
Due for f/u.f/u no later than July. Last appt for migraines 7/15

## 2014-11-10 ENCOUNTER — Other Ambulatory Visit: Payer: Self-pay | Admitting: *Deleted

## 2014-11-10 MED ORDER — TOPIRAMATE 50 MG PO TABS: 50.0000 mg | ORAL_TABLET | Freq: Two times a day (BID) | ORAL | Status: DC

## 2014-11-10 MED ORDER — TRAZODONE HCL 150 MG PO TABS: ORAL_TABLET | ORAL | Status: DC

## 2014-11-10 NOTE — Telephone Encounter (Signed)
[facility] for trazodone 150mg  at bedtime. [place] for topomax to increase to 50mg  bid. Make sure does not need refills.

## 2014-11-10 NOTE — Telephone Encounter (Signed)
Increased both medications & sent them to [facility].  LMOM notifying pt.

## 2014-11-18 ENCOUNTER — Other Ambulatory Visit: Payer: Self-pay | Admitting: Physician Assistant

## 2014-11-21 NOTE — Telephone Encounter (Signed)
I called patient to ask about the levothyroxine allergy. She denies allergy to levothyroxine.

## 2014-11-23 ENCOUNTER — Other Ambulatory Visit: Payer: Self-pay | Admitting: Physician Assistant

## 2014-11-25 ENCOUNTER — Ambulatory Visit (INDEPENDENT_AMBULATORY_CARE_PROVIDER_SITE_OTHER): Payer: Medicare Other | Admitting: Physician Assistant

## 2014-11-25 ENCOUNTER — Encounter: Payer: Self-pay | Admitting: Physician Assistant

## 2014-11-25 VITALS — BP 123/73 | HR 83 | Temp 98.0°F | Wt 229.0 lb

## 2014-11-25 DIAGNOSIS — M5442 Lumbago with sciatica, left side: Secondary | ICD-10-CM

## 2014-11-25 DIAGNOSIS — E669 Obesity, unspecified: Secondary | ICD-10-CM | POA: Diagnosis not present

## 2014-11-25 DIAGNOSIS — M545 Low back pain, unspecified: Secondary | ICD-10-CM | POA: Insufficient documentation

## 2014-11-25 DIAGNOSIS — G43001 Migraine without aura, not intractable, with status migrainosus: Secondary | ICD-10-CM

## 2014-11-25 DIAGNOSIS — J0100 Acute maxillary sinusitis, unspecified: Secondary | ICD-10-CM

## 2014-11-25 DIAGNOSIS — Z6834 Body mass index (BMI) 34.0-34.9, adult: Secondary | ICD-10-CM | POA: Insufficient documentation

## 2014-11-25 DIAGNOSIS — E6609 Other obesity due to excess calories: Secondary | ICD-10-CM | POA: Insufficient documentation

## 2014-11-25 DIAGNOSIS — M5441 Lumbago with sciatica, right side: Secondary | ICD-10-CM | POA: Diagnosis not present

## 2014-11-25 MED ORDER — KETOROLAC TROMETHAMINE 10 MG PO TABS: 10.0000 mg | ORAL_TABLET | Freq: Four times a day (QID) | ORAL | Status: DC | PRN

## 2014-11-25 MED ORDER — MELOXICAM 15 MG PO TABS: 15.0000 mg | ORAL_TABLET | Freq: Every day | ORAL | Status: DC | PRN

## 2014-11-25 MED ORDER — RIZATRIPTAN BENZOATE 10 MG PO TBDP: ORAL_TABLET | ORAL | Status: DC

## 2014-11-25 MED ORDER — PHENTERMINE HCL 37.5 MG PO TABS: 37.5000 mg | ORAL_TABLET | Freq: Every day | ORAL | Status: DC

## 2014-11-25 MED ORDER — DOXYCYCLINE HYCLATE 100 MG PO TABS: 100.0000 mg | ORAL_TABLET | Freq: Two times a day (BID) | ORAL | Status: DC

## 2014-11-25 NOTE — Progress Notes (Signed)
   Subjective:    Patient ID: Aimee Little, female    DOB: October 26, 1960, 54 y.o.   MRN: [id number]  HPI  Patient is a 54 year old female who presents to the clinic with 3-4 days of sinus pressure, nasal drainage, ear pain and facial pain. She's tried using Flonase with little to no relief. Her symptoms have triggered her migraine. She has had to use more the Maxalt recently. She does need a refill. Otherwise her migraines have been controlled.  She has also total 40 pounds on her own. She really feels like she needs some extra help with the next 40. She's tried phentermine in the past and tolerated well but she feels like she is really ready to do her part with diet and exercise at this time.  Low back pain with bilateral sciatica-she was doing much better after recent epidural injection. She did fall up the steps at her apartment. She would like a refill of Modic.      Review of Systems  All other systems reviewed and are negative.      Objective:   Physical Exam  Constitutional: She is oriented to person, place, and time. She appears well-developed and well-nourished.  HENT:  Head: Normocephalic and atraumatic.  Right Ear: External ear normal.  Left Ear: External ear normal.  TMs clear bilaterally.  Oropharynx erythematous with no tonsillar draining however postnasal drip is present.  Maxillary tenderness to palpation bilaterally.  Bilateral nasal turbinates red and swollen.  Eyes: Conjunctivae are normal. Right eye exhibits no discharge. Left eye exhibits no discharge.  Neck: Normal range of motion. Neck supple.  Cardiovascular: Normal rate, regular rhythm and normal heart sounds.   Pulmonary/Chest: Effort normal and breath sounds normal. She has no wheezes.  Lymphadenopathy:    She has no cervical adenopathy.  Neurological: She is alert and oriented to person, place, and time.  Skin: Skin is dry.  Psychiatric: She has a normal mood and affect. Her behavior is normal.           Assessment & Plan:  Acute maxillary sinusitis-she has responded well to doxycycline in the past. I did give her 10 days of doxycycline. Continue to use Flonase 2 sprays each nostril daily. Gave handout on other symptomatic relief for sinusitis. Encouraged sinus rinses.  Migraines-refilled Maxalt for as needed usage for migraines. Toradol also helps her migraines. Gave her 20 to use as needed when Maxalt not working.  Obesity-patient stone very well with weight loss on her own. I did give her a one-month supply phentermine to start. Would like to follow-up in 4 weeks to check vitals as well as if she continues to lose weight. Discussed she has to do this in combination with diet and exercise. Patient is aware of potential side effects of phentermine and the short-term use.  Low back pain with sciatica-refilled motivate to use as needed for back pain. Encouraged patient to work on exercises and stretches. Follow-up with her orthopedist if not improving.

## 2014-12-07 ENCOUNTER — Ambulatory Visit: Payer: Medicare Other

## 2014-12-07 ENCOUNTER — Ambulatory Visit (INDEPENDENT_AMBULATORY_CARE_PROVIDER_SITE_OTHER): Payer: Medicare Other | Admitting: Physician Assistant

## 2014-12-07 ENCOUNTER — Encounter: Payer: Self-pay | Admitting: Physician Assistant

## 2014-12-07 VITALS — BP 134/79 | HR 80 | Ht 70.0 in | Wt 226.0 lb

## 2014-12-07 DIAGNOSIS — R0602 Shortness of breath: Secondary | ICD-10-CM

## 2014-12-07 DIAGNOSIS — R0789 Other chest pain: Secondary | ICD-10-CM | POA: Diagnosis not present

## 2014-12-07 DIAGNOSIS — F410 Panic disorder [episodic paroxysmal anxiety] without agoraphobia: Secondary | ICD-10-CM | POA: Diagnosis not present

## 2014-12-07 MED ORDER — BUPROPION HCL ER (XL) 150 MG PO TB24: ORAL_TABLET | ORAL | Status: DC

## 2014-12-07 MED ORDER — DIAZEPAM 5 MG PO TABS: 5.0000 mg | ORAL_TABLET | Freq: Two times a day (BID) | ORAL | Status: DC | PRN

## 2014-12-07 NOTE — Progress Notes (Signed)
   Subjective:    Patient ID: Aimee Little, female    DOB: 10-Mar-1961, 54 y.o.   MRN: [id number]  HPI  Pt is a 54 yo female who presents to the clinic with chest tightness, feelings of SOB, and anxiety/worry since Monday, about 3 days ago. She did start phentermine Monday one dose when symptoms started. She has not taken since. She feels like she cannot get a good deep breath. She denies any fever, chills, cough, sinus pressure. She does have a mild headache. Not taking toradol. Has a hx of anxiety and depression. At one point she was on valium for panic attacks but not had panic attack in a while. Pt feels like symptoms are consistent with panic attack. Not started any other new medications. She has started a new job but only working 15-20 hours. No other stressers. Not tried anything acutely to help with anxiety.      Review of Systems  All other systems reviewed and are negative.      Objective:   Physical Exam  Constitutional: She is oriented to person, place, and time. She appears well-developed and well-nourished.  Obese.   HENT:  Head: Normocephalic and atraumatic.  Cardiovascular: Normal rate, regular rhythm and normal heart sounds.   Pulmonary/Chest: Effort normal and breath sounds normal. She has no wheezes.  Neurological: She is alert and oriented to person, place, and time.  Psychiatric:  Anxious/worried          Assessment & Plan:  Chest tightness/SOB/panic attacks- potentially could have been triggered by phentermine. Stop phentermine and do not try it again.  EKG unchanged from 2013 EKG.  Last lipid level great.  No signs or symptoms or stroke.  I feel like symptoms are consistent with panic attacks. Valium given to take as needed for next couple of days. Increased wellbutrin to 450mg  daily.  Will get CXR today.  If symptoms not improving go to ER for full cardaic work up. Follow up in one week in office.

## 2014-12-07 NOTE — Patient Instructions (Signed)
Will get CXR

## 2014-12-09 ENCOUNTER — Ambulatory Visit: Payer: Medicare Other | Admitting: Physician Assistant

## 2014-12-09 ENCOUNTER — Telehealth: Payer: Self-pay

## 2014-12-09 ENCOUNTER — Ambulatory Visit (INDEPENDENT_AMBULATORY_CARE_PROVIDER_SITE_OTHER): Payer: Medicare Other

## 2014-12-09 DIAGNOSIS — R0789 Other chest pain: Secondary | ICD-10-CM

## 2014-12-09 DIAGNOSIS — R0602 Shortness of breath: Secondary | ICD-10-CM | POA: Diagnosis not present

## 2014-12-09 MED ORDER — ALPRAZOLAM 0.5 MG PO TABS: 0.5000 mg | ORAL_TABLET | Freq: Two times a day (BID) | ORAL | Status: DC | PRN

## 2014-12-09 NOTE — Telephone Encounter (Addendum)
Aimee Little states the valium didn't relieve her symptoms. She did take a xanax her aunt had and this helped with her symptoms. Patient states she will bring in the rest of the valium. Hold message until patient arrives.

## 2014-12-10 NOTE — Telephone Encounter (Signed)
Patient was given xanax prescription. Amber disposed of the valium.

## 2014-12-14 ENCOUNTER — Ambulatory Visit: Payer: Medicare Other | Admitting: Physician Assistant

## 2014-12-14 ENCOUNTER — Other Ambulatory Visit: Payer: Self-pay | Admitting: Physician Assistant

## 2014-12-14 NOTE — Addendum Note (Signed)
Addended by: Jamesetta So on: 12/14/2014 03:41 PM   Modules accepted: Orders

## 2014-12-15 ENCOUNTER — Other Ambulatory Visit: Payer: Self-pay | Admitting: *Deleted

## 2014-12-15 MED ORDER — DEXLANSOPRAZOLE 60 MG PO CPDR: 60.0000 mg | DELAYED_RELEASE_CAPSULE | Freq: Every day | ORAL | Status: DC

## 2014-12-15 MED ORDER — TRAMADOL HCL 50 MG PO TABS: ORAL_TABLET | ORAL | Status: DC

## 2014-12-27 ENCOUNTER — Encounter: Payer: Self-pay | Admitting: Physician Assistant

## 2014-12-27 ENCOUNTER — Ambulatory Visit (INDEPENDENT_AMBULATORY_CARE_PROVIDER_SITE_OTHER): Payer: Medicare Other | Admitting: Physician Assistant

## 2014-12-27 VITALS — BP 131/83 | HR 81 | Wt 224.0 lb

## 2014-12-27 DIAGNOSIS — J309 Allergic rhinitis, unspecified: Secondary | ICD-10-CM

## 2014-12-27 MED ORDER — AMOXICILLIN-POT CLAVULANATE 875-125 MG PO TABS
1.0000 | ORAL_TABLET | Freq: Two times a day (BID) | ORAL | Status: DC
Start: 2014-12-27 — End: 2015-01-10

## 2014-12-27 MED ORDER — METHYLPREDNISOLONE SODIUM SUCC 125 MG IJ SOLR
125.0000 mg | Freq: Once | INTRAMUSCULAR | Status: AC
  Administered 2014-12-27: 125 mg via INTRAVENOUS

## 2014-12-27 NOTE — Progress Notes (Signed)
   Subjective:    Patient ID: Aimee Little, female    DOB: 02/03/1961, 54 y.o.   MRN: [id number]  HPI Pt presents to the clinic with sinus pressure, nasal congestion, and facial pain since late Friday night. Has hx of mirgaines. Friday she was cleaning and dusting all day long. She has had this before where dust has caused sinus infection. She took one toradol and maxalt which has helped keep migraine uncontrol. She is using flonase. She is not on any anti-histamine. Zyrtec makes her climb the walls. She uses allergra or claritin as needed. Has not used for this current episode. No fever, chills, ST, ear pain, cough, wheezing or SOB.     Review of Systems  All other systems reviewed and are negative.      Objective:   Physical Exam  Constitutional: She appears well-developed and well-nourished.  HENT:  Head: Normocephalic and atraumatic.  Right Ear: External ear normal.  Left Ear: External ear normal.  Nose: Nose normal.  Mouth/Throat: Oropharynx is clear and moist. No oropharyngeal exudate.  Tenderness to palpation over maxillary sinuses and nasal bridge.   Eyes:  Bilateral conjunctiva injected with watery discharge.   Neck: Normal range of motion. Neck supple.  Cardiovascular: Normal rate, regular rhythm and normal heart sounds.   Pulmonary/Chest: Effort normal and breath sounds normal. She has no wheezes.  Lymphadenopathy:    She has no cervical adenopathy.  Psychiatric: She has a normal mood and affect. Her behavior is normal.          Assessment & Plan:  Allergic sinusitis- Solumedrol 125mg  IM given today. Start anti-histamine claritin or allergra D. Continue flonase 2 sprays each nostril daily. Consider nasal saline rinses daily.  I do think symptoms are more allergic. If not improving we are sitting at day 4 of symptoms given augmentin for 10 days to start. Follow up as needed.

## 2014-12-27 NOTE — Patient Instructions (Addendum)

## 2014-12-28 ENCOUNTER — Other Ambulatory Visit: Payer: Self-pay | Admitting: Physician Assistant

## 2015-01-10 ENCOUNTER — Ambulatory Visit (INDEPENDENT_AMBULATORY_CARE_PROVIDER_SITE_OTHER): Payer: Medicare Other | Admitting: Family Medicine

## 2015-01-10 ENCOUNTER — Encounter: Payer: Self-pay | Admitting: Family Medicine

## 2015-01-10 VITALS — BP 148/78 | HR 84 | Ht 70.0 in | Wt 224.0 lb

## 2015-01-10 DIAGNOSIS — R1011 Right upper quadrant pain: Secondary | ICD-10-CM

## 2015-01-10 DIAGNOSIS — R1012 Left upper quadrant pain: Secondary | ICD-10-CM | POA: Diagnosis not present

## 2015-01-10 DIAGNOSIS — R1010 Upper abdominal pain, unspecified: Secondary | ICD-10-CM

## 2015-01-10 DIAGNOSIS — R10811 Right upper quadrant abdominal tenderness: Secondary | ICD-10-CM | POA: Diagnosis not present

## 2015-01-10 MED ORDER — SUCRALFATE 1 GM/10ML PO SUSP: 1.0000 g | Freq: Three times a day (TID) | ORAL | Status: DC

## 2015-01-10 NOTE — Progress Notes (Signed)
   Subjective:    Patient ID: Aimee Little, female    DOB: [birth year], 54 y.o.   MRN: [id number]  HPI   Sxs started 1 week ago.  L sided tenderness that is tender to touch, sore and constant. No worse with eating.  Pt has acid reflux, heartburn, nausea, diarrhea she stated that she just "feels sick". Diarrhea has not been daily but just intermittent. Says GI and had endoscopy and was put on Dexilant awhile back ( maybe 3 years ago) for an ulcer. She takes that regularly.  She vomited once. No blood or bile.  She has been taking Vit D , b12 and a MVI.  No fever, or weight loss.  She has felt extremely tired.  She has been taking her Mobic more regularly.  + chills.    Review of Systems     Objective:   Physical Exam  Constitutional: She is oriented to person, place, and time. She appears well-developed and well-nourished.  HENT:  Head: Normocephalic and atraumatic.  Cardiovascular: Normal rate, regular rhythm and normal heart sounds.   Pulmonary/Chest: Effort normal and breath sounds normal.  Abdominal: Soft. Bowel sounds are normal. She exhibits no mass. There is tenderness. There is no rebound and no guarding.  TTP diffusely but most tender in the RUQ.  Nontender in the lower abdomen.   Neurological: She is alert and oriented to person, place, and time.  Skin: Skin is warm and dry.  Psychiatric: She has a normal mood and affect. Her behavior is normal.          Assessment & Plan:  Bilateral upper abdominal pain-unclear etiology at this point. Could just be gastritis versus casher ulcer secondary to recent increase in NSAID use. Encouraged her to stop all NSAIDs prescription and over-the-counter. Encouraged her to eat a more bland diet. Will add Carafate to her regimen. Continued Dexilant once a day. We'll also do blood work to evaluate for infection, pancreatitis, hepatitis. We'll also schedule for ultrasound of the gallbladder since she was most tender in the right upper quadrant on  exam today.

## 2015-01-11 LAB — COMPLETE METABOLIC PANEL WITH GFR
ALT: 19 U/L (ref 0–35)
AST: 15 U/L (ref 0–37)
Albumin: 4.1 g/dL (ref 3.5–5.2)
Alkaline Phosphatase: 65 U/L (ref 39–117)
BUN: 14 mg/dL (ref 6–23)
CALCIUM: 8.9 mg/dL (ref 8.4–10.5)
CO2: 22 meq/L (ref 19–32)
Chloride: 109 mEq/L (ref 96–112)
Creat: 0.7 mg/dL (ref 0.50–1.10)
GFR, Est African American: >89 mL/min
GFR, Est Non African American: >89 mL/min
Glucose, Bld: 72 mg/dL (ref 70–99)
Potassium: 4 mEq/L (ref 3.5–5.3)
SODIUM: 141 meq/L (ref 135–145)
Total Bilirubin: 0.5 mg/dL (ref 0.2–1.2)
Total Protein: 6.1 g/dL (ref 6.0–8.3)

## 2015-01-11 LAB — CBC WITH DIFFERENTIAL/PLATELET
BASOS ABS: 0 10*3/uL (ref 0.0–0.1)
Basophils Relative: 0 % (ref 0–1)
EOS PCT: 2 % (ref 0–5)
Eosinophils Absolute: 0.2 10*3/uL (ref 0.0–0.7)
HCT: 44.2 % (ref 36.0–46.0)
HEMOGLOBIN: 15.4 g/dL — AB (ref 12.0–15.0)
Lymphocytes Relative: 24 % (ref 12–46)
Lymphs Abs: 2.4 10*3/uL (ref 0.7–4.0)
MCH: 33.2 pg (ref 26.0–34.0)
MCHC: 34.8 g/dL (ref 30.0–36.0)
MCV: 95.3 fL (ref 78.0–100.0)
MPV: 11.1 fL (ref 8.6–12.4)
Monocytes Absolute: 0.6 10*3/uL (ref 0.1–1.0)
Monocytes Relative: 6 % (ref 3–12)
Neutro Abs: 6.9 10*3/uL (ref 1.7–7.7)
Neutrophils Relative %: 68 % (ref 43–77)
PLATELETS: 249 10*3/uL (ref 150–400)
RBC: 4.64 MIL/uL (ref 3.87–5.11)
RDW: 13.9 % (ref 11.5–15.5)
WBC: 10.1 10*3/uL (ref 4.0–10.5)

## 2015-01-11 LAB — LIPASE: Lipase: 29 U/L (ref 0–75)

## 2015-01-11 LAB — AMYLASE: AMYLASE: 34 U/L (ref 0–105)

## 2015-01-20 ENCOUNTER — Encounter: Payer: Medicare Other | Admitting: Physician Assistant

## 2015-01-27 ENCOUNTER — Encounter: Payer: Medicare Other | Admitting: Physician Assistant

## 2015-02-10 ENCOUNTER — Ambulatory Visit (INDEPENDENT_AMBULATORY_CARE_PROVIDER_SITE_OTHER): Payer: Medicare Other | Admitting: Family Medicine

## 2015-02-10 ENCOUNTER — Encounter: Payer: Self-pay | Admitting: Sports Medicine

## 2015-02-10 ENCOUNTER — Encounter: Payer: Self-pay | Admitting: Family Medicine

## 2015-02-10 ENCOUNTER — Ambulatory Visit (INDEPENDENT_AMBULATORY_CARE_PROVIDER_SITE_OTHER): Payer: Medicare Other | Admitting: Sports Medicine

## 2015-02-10 VITALS — BP 130/75 | HR 89 | Ht 70.0 in | Wt 225.0 lb

## 2015-02-10 DIAGNOSIS — M1712 Unilateral primary osteoarthritis, left knee: Secondary | ICD-10-CM

## 2015-02-10 DIAGNOSIS — Z0000 Encounter for general adult medical examination without abnormal findings: Secondary | ICD-10-CM

## 2015-02-10 MED ORDER — TRAMADOL HCL 50 MG PO TABS: ORAL_TABLET | ORAL | Status: DC

## 2015-02-10 MED ORDER — BACLOFEN 10 MG PO TABS: ORAL_TABLET | ORAL | Status: DC

## 2015-02-10 MED ORDER — NAPROXEN-ESOMEPRAZOLE 500-20 MG PO TBEC: 1.0000 | DELAYED_RELEASE_TABLET | Freq: Two times a day (BID) | ORAL | Status: DC

## 2015-02-10 MED ORDER — DICLOFENAC SODIUM 2 % TD SOLN: TRANSDERMAL | Status: DC

## 2015-02-10 NOTE — Progress Notes (Signed)
   Subjective:    I'm seeing this patient as a consultation for:  Iran Planas, PA-C  CC: Left knee osteoarthritis  HPI: This is a pleasant 54 year old female with a long history of left knee pain, diagnosed as osteoarthritis, she did have an injection sometime ago, approximately 6 months. She's never done physical therapy, and she has been unable to take NSAIDs due to history of peptic ulcer disease. Symptoms are moderate, persistent and localized predominantly the lateral joint line and under the patella. No mechanical symptoms.  Past medical history, Surgical history, Family history not pertinant except as noted below, Social history, Allergies, and medications have been entered into the medical record, reviewed, and no changes needed.   Review of Systems: No headache, visual changes, nausea, vomiting, diarrhea, constipation, dizziness, abdominal pain, skin rash, fevers, chills, night sweats, weight loss, swollen lymph nodes, body aches, joint swelling, muscle aches, chest pain, shortness of breath, mood changes, visual or auditory hallucinations.   Objective:   General: Well Developed, well nourished, and in no acute distress.  Neuro/Psych: Alert and oriented x3, extra-ocular muscles intact, able to move all 4 extremities, sensation grossly intact. Skin: Warm and dry, no rashes noted.  Respiratory: Not using accessory muscles, speaking in full sentences, trachea midline.  Cardiovascular: Pulses palpable, no extremity edema. Abdomen: Does not appear distended. Left Knee: Minimally swollen without a fluid wave, tender to palpation under the medial and lateral patellar facets ROM normal in flexion and extension and lower leg rotation. Ligaments with solid consistent endpoints including ACL, PCL, LCL, MCL. Negative Mcmurray's and provocative meniscal tests. Non painful patellar compression. Patellar and quadriceps tendons unremarkable. Hamstring and quadriceps strength is  normal.  Procedure: Real-time Ultrasound Guided Injection of left knee Device: GE Logiq E  Verbal informed consent obtained.  Time-out conducted.  Noted no overlying erythema, induration, or other signs of local infection.  Skin prepped in a sterile fashion.  Local anesthesia: Topical Ethyl chloride.  With sterile technique and under real time ultrasound guidance:  2 mL kenalog 40, 4 mL lidocaine injected easily into the suprapatellar recess. Completed without difficulty  Pain immediately resolved suggesting accurate placement of the medication.  Advised to call if fevers/chills, erythema, induration, drainage, or persistent bleeding.  Images permanently stored and available for review in the ultrasound unit.  Impression: Technically successful ultrasound guided injection.  Impression and Recommendations:   This case required medical decision making of moderate complexity.

## 2015-02-10 NOTE — Progress Notes (Signed)
Subjective:    Aimee Little is a 54 y.o. female who presents for Medicare Annual/Subsequent preventive examination.  Preventive Screening-Counseling & Management  Tobacco History  Smoking status  . Former Smoker -- 0.50 packs/day  . Quit date: 10/02/2013  Smokeless tobacco  . Not on file    Colonoscopy: 2015 UTD 3 y cleareance Papsmear: Received last year, normal Mammogram: Normal 10/2014   Influenza Vaccine: no indication Pneumovax: no current indication Td/Tdap: UTD 2014 Zoster: (Start 55 yo)   Problems Prior to Visit 1. Fibromyalgia, muscle spasm  Current Problems (verified) Patient Active Problem List   Diagnosis Date Noted  . Obesity 11/25/2014  . Lumbago 11/25/2014  . Nausea with vomiting 04/07/2014  . Adenomatous colon polyp 12/20/2013  . Major depression, recurrent 12/17/2013  . Former smoker 11/05/2013  . Chronic pain syndrome 11/05/2013  . Medication management 11/05/2013  . Osteoarthritis of left knee 07/21/2013  . Migraine with status migrainosus 07/21/2013  . Unspecified vitamin D deficiency 05/16/2013  . Fibromyalgia 01/01/2013  . TOBACCO ABUSE 06/13/2010  . DEPRESSION, MILD 02/06/2010  . UNSPECIFIED HYPOTHYROIDISM 12/29/2009  . HOT FLASHES 12/29/2009  . DEGENERATIVE JOINT DISEASE, LEFT KNEE 12/29/2009  . [place] DISEASE, LUMBAR 12/29/2009    Medications Prior to Visit Current Outpatient Prescriptions on File Prior to Visit  Medication Sig Dispense Refill  . ALPRAZolam (XANAX) 0.5 MG tablet Take 1 tablet (0.5 mg total) by mouth 2 (two) times daily as needed for anxiety. 45 tablet 0  . AMBULATORY NON FORMULARY MEDICATION Compression stockings, please fit to size, pressure 20-25mmHg one pair one refill. Dx Venous Insufficiency 1 Units 1  . baclofen (LIORESAL) 10 MG tablet TAKE ONE TABLET BY MOUTH 3 TIMES A DAY 30 tablet 2  . buPROPion (WELLBUTRIN XL) 150 MG 24 hr tablet Take 3 tablets every morning. 90 tablet 1  . Cyanocobalamin (VITAMIN B 12)  100 MCG LOZG Take 1 tablet by mouth.    . dexlansoprazole (DEXILANT) 60 MG capsule Take 1 capsule (60 mg total) by mouth daily. 30 capsule 2  . fluticasone (FLONASE) 50 MCG/ACT nasal spray USE 1 TO 2 SPRAYS IN EACH NOSTRIL ONCE DAILY. 16 g 6  . furosemide (LASIX) 20 MG tablet One to two tablets at noon daily as needed only for fluid in legs. 60 tablet 1  . gabapentin (NEURONTIN) 300 MG capsule Take 1 capsule (300 mg total) by mouth 3 (three) times daily. 90 capsule 1  . ketorolac (TORADOL) 10 MG tablet Take 1 tablet (10 mg total) by mouth every 6 (six) hours as needed. 20 tablet 0  . levothyroxine (SYNTHROID, LEVOTHROID) 137 MCG tablet TAKE ONE TABLET BY MOUTH EVERY DAY BEFORE BREAKFAST 30 tablet 5  . meloxicam (MOBIC) 15 MG tablet Take 1 tablet (15 mg total) by mouth daily as needed (Back Pain.). 30 tablet 2  . ondansetron (ZOFRAN) 4 MG tablet TAKE ONE TABLET BY MOUTH EVERY 8 HOURS AS NEEDED 30 tablet 0  . ranitidine (ZANTAC) 300 MG tablet Take 1 tablet (300 mg total) by mouth 2 (two) times daily. 180 tablet 3  . rizatriptan (MAXALT-MLT) 10 MG disintegrating tablet TAKE ONE TABLET BY MOUTH AS NEEDED. MAY REPEAT IN 2 HOURS IF NEEDED. 10 tablet 5  . sucralfate (CARAFATE) 1 GM/10ML suspension Take 10 mLs (1 g total) by mouth 4 (four) times daily -  with meals and at bedtime. 420 mL 0  . topiramate (TOPAMAX) 50 MG tablet Take 1 tablet (50 mg total) by mouth 2 (two) times daily.  60 tablet 1  . traMADol (ULTRAM) 50 MG tablet TAKE ONE TABLET EVERY 4 TO 6 HOURS AS NEEDED FOR PAIN 60 tablet 0  . traZODone (DESYREL) 150 MG tablet TAKE 1 TABLET AT BEDTIME AS NEEDED FOR SLEEP 30 tablet 1  . Vitamin D, Ergocalciferol, (DRISDOL) 50000 UNITS CAPS capsule Take 1 capsule (50,000 Units total) by mouth every 7 (seven) days. 12 capsule 0   No current facility-administered medications on file prior to visit.    Current Medications (verified) Current Outpatient Prescriptions  Medication Sig Dispense Refill  .  ALPRAZolam (XANAX) 0.5 MG tablet Take 1 tablet (0.5 mg total) by mouth 2 (two) times daily as needed for anxiety. 45 tablet 0  . AMBULATORY NON FORMULARY MEDICATION Compression stockings, please fit to size, pressure 20-97mmHg one pair one refill. Dx Venous Insufficiency 1 Units 1  . baclofen (LIORESAL) 10 MG tablet TAKE ONE TABLET BY MOUTH 3 TIMES A DAY 30 tablet 2  . buPROPion (WELLBUTRIN XL) 150 MG 24 hr tablet Take 3 tablets every morning. 90 tablet 1  . Cyanocobalamin (VITAMIN B 12) 100 MCG LOZG Take 1 tablet by mouth.    . dexlansoprazole (DEXILANT) 60 MG capsule Take 1 capsule (60 mg total) by mouth daily. 30 capsule 2  . fluticasone (FLONASE) 50 MCG/ACT nasal spray USE 1 TO 2 SPRAYS IN EACH NOSTRIL ONCE DAILY. 16 g 6  . furosemide (LASIX) 20 MG tablet One to two tablets at noon daily as needed only for fluid in legs. 60 tablet 1  . gabapentin (NEURONTIN) 300 MG capsule Take 1 capsule (300 mg total) by mouth 3 (three) times daily. 90 capsule 1  . ketorolac (TORADOL) 10 MG tablet Take 1 tablet (10 mg total) by mouth every 6 (six) hours as needed. 20 tablet 0  . levothyroxine (SYNTHROID, LEVOTHROID) 137 MCG tablet TAKE ONE TABLET BY MOUTH EVERY DAY BEFORE BREAKFAST 30 tablet 5  . meloxicam (MOBIC) 15 MG tablet Take 1 tablet (15 mg total) by mouth daily as needed (Back Pain.). 30 tablet 2  . ondansetron (ZOFRAN) 4 MG tablet TAKE ONE TABLET BY MOUTH EVERY 8 HOURS AS NEEDED 30 tablet 0  . ranitidine (ZANTAC) 300 MG tablet Take 1 tablet (300 mg total) by mouth 2 (two) times daily. 180 tablet 3  . rizatriptan (MAXALT-MLT) 10 MG disintegrating tablet TAKE ONE TABLET BY MOUTH AS NEEDED. MAY REPEAT IN 2 HOURS IF NEEDED. 10 tablet 5  . sucralfate (CARAFATE) 1 GM/10ML suspension Take 10 mLs (1 g total) by mouth 4 (four) times daily -  with meals and at bedtime. 420 mL 0  . topiramate (TOPAMAX) 50 MG tablet Take 1 tablet (50 mg total) by mouth 2 (two) times daily. 60 tablet 1  . traMADol (ULTRAM) 50 MG  tablet TAKE ONE TABLET EVERY 4 TO 6 HOURS AS NEEDED FOR PAIN 60 tablet 0  . traZODone (DESYREL) 150 MG tablet TAKE 1 TABLET AT BEDTIME AS NEEDED FOR SLEEP 30 tablet 1  . Vitamin D, Ergocalciferol, (DRISDOL) 50000 UNITS CAPS capsule Take 1 capsule (50,000 Units total) by mouth every 7 (seven) days. 12 capsule 0   No current facility-administered medications for this visit.     Allergies (verified) Doxepin; Gabapentin; Mirapex; Phentermine; and Pregabalin   PAST HISTORY  Family History Family History  Problem Relation Age of Onset  . Depression Mother   . Hyperlipidemia Mother   . Hypertension Mother   . Heart attack Father   . Hypertension Sister   .  Diabetes Sister   . Alcohol abuse Brother   . Hypertension Brother     Social History History  Substance Use Topics  . Smoking status: Former Smoker -- 0.50 packs/day    Quit date: 10/02/2013  . Smokeless tobacco: Not on file  . Alcohol Use: Not on file     Are there smokers in your home (other than you)? No  Risk Factors Current exercise habits: The patient does not participate in regular exercise at present.  Dietary issues discussed: DASH   Cardiac risk factors: sedentary lifestyle.  Depression Screen (Note: if answer to either of the following is "Yes", a more complete depression screening is indicated)   Over the past two weeks, have you felt down, depressed or hopeless? No  Over the past two weeks, have you felt little interest or pleasure in doing things? No  Have you lost interest or pleasure in daily life? No  Do you often feel hopeless? No  Do you cry easily over simple problems? No  Activities of Daily Living In your present state of health, do you have any difficulty performing the following activities?:  Driving? No Managing money?  No Feeding yourself? No Getting from bed to chair? No  Climbing a flight of stairs? No Preparing food and eating?: No Bathing or showering? No Getting dressed:  No Getting to the toilet? No Using the toilet:No Moving around from place to place: No In the past year have you fallen or had a near fall?:No   Are you sexually active?  No  Do you have more than one partner?  No  Hearing Difficulties: No Do you often ask people to speak up or repeat themselves? No Do you experience ringing or noises in your ears? No Do you have difficulty understanding soft or whispered voices? No   Do you feel that you have a problem with memory? No  Do you often misplace items? No  Do you feel safe at home?  Yes  Cognitive Testing  Alert? Yes  Normal Appearance?Yes  Oriented to person? Yes  Place? Yes   Time? Yes  Recall of three objects?  Yes  Can perform simple calculations? Yes  Displays appropriate judgment?Yes  Can read the correct time from a watch face?Yes   Advanced Directives have been discussed with the patient? Yes  List the Names of Other Physician/Practitioners you currently use: 1.  [place] any recent Medical Services you may have received from other than [facility] providers in the past year (date may be approximate).  Immunization History  Administered Date(s) Administered  . Influenza Whole 06/13/2010  . Influenza,inj,Quad PF,36+ Mos 07/21/2013, 06/08/2014  . Tdap 01/01/2013    Screening Tests Health Maintenance  Topic Date Due  . HIV Screening  12/04/1974  . PAP SMEAR  12/03/1977  . INFLUENZA VACCINE  05/01/2015  . MAMMOGRAM  11/09/2016  . COLONOSCOPY  12/20/2016  . TETANUS/TDAP  01/02/2023    All answers were reviewed with the patient and necessary referrals were made:  Marcial Pacas, DO   02/10/2015   History reviewed: allergies, current medications, past family history, past medical history, past social history, past surgical history and problem list  Review of Systems Review of Systems - General ROS: negative for - chills, fever, night sweats, weight gain or weight loss Ophthalmic ROS: negative for -  decreased vision Psychological ROS: negative for - anxiety or depression ENT ROS: negative for - hearing change, nasal congestion, tinnitus or allergies Hematological and Lymphatic  ROS: negative for - bleeding problems, bruising or swollen lymph nodes Breast ROS: negative Respiratory ROS: no cough, shortness of breath, or wheezing Cardiovascular ROS: no chest pain or dyspnea on exertion Gastrointestinal ROS: no abdominal pain, change in bowel habits, or black or bloody stools Genito-Urinary ROS: negative for - genital discharge, genital ulcers, incontinence or abnormal bleeding from genitals Musculoskeletal ROS: negative for -muscle pain. Positive for left knee pain Neurological ROS: negative for - headaches or memory loss Dermatological ROS: negative for lumps, mole changes, rash and skin lesion changes   Objective:     Vision by Snellen chart: right eye:20/25, left eye:20/25  Body mass index is 32.28 kg/(m^2). BP 130/75 mmHg  Pulse 89  Wt 225 lb (102.059 kg)  General: No Acute Distress HEENT: Atraumatic, normocephalic, conjunctivae normal without scleral icterus.  No nasal discharge, hearing grossly intact, TMs with good landmarks bilaterally with no middle ear abnormalities, posterior pharynx clear without oral lesions. Neck: Supple, trachea midline, no cervical nor supraclavicular adenopathy. Pulmonary: Clear to auscultation bilaterally without wheezing, rhonchi, nor rales. Cardiac: Regular rate and rhythm.  No murmurs, rubs, nor gallops. No peripheral edema.  2+ peripheral pulses bilaterally. Abdomen: Bowel sounds normal.  No masses.  Non-tender without rebound.  Negative Murphy's sign. MSK: Grossly intact, no signs of weakness.  Full strength throughout upper and lower extremities.  Full ROM in upper and lower extremities.  No midline spinal tenderness. Neuro: Gait unremarkable, CN II-XII grossly intact.  C5-C6 Reflex 2/4 Bilaterally, L4 Reflex 2/4 Bilaterally.  Cerebellar  function intact. Skin: No rashes. Psych: Alert and oriented to person/place/time.  Thought process normal. No anxiety/depression.      Assessment:     Normal complete physical exam with complaints of left knee pain from osteoarthritis     Plan:     During the course of the visit the patient was educated and counseled about appropriate screening and preventive services including:    Nutrition counseling   Discussed that I'm unable to provide her with a steroid injection today since she was scheduled for wellness exam but I know that one of my partners would be more than happy to help her with this if they have openings today.  Patient Instructions (the written plan) was given to the patient.  Medicare Attestation I have personally reviewed: The patient's medical and social history Their use of alcohol, tobacco or illicit drugs Their current medications and supplements The patient's functional ability including ADLs,fall risks, home safety risks, cognitive, and hearing and visual impairment Diet and physical activities Evidence for depression or mood disorders  The patient's weight, height, BMI, and visual acuity have been recorded in the chart.  I have made referrals, counseling, and provided education to the patient based on review of the above and I have provided the patient with a written personalized care plan for preventive services.     Marcial Pacas, DO   02/10/2015

## 2015-02-10 NOTE — Assessment & Plan Note (Signed)
Previous injection was approximately 6 months ago at an outside facility. Mostly patellofemoral chondromalacia. New set of x-rays, she does have a history of peptic ulcer disease so we will use Vimovo, injection today, information given on Visco supplementation. Formal physical therapy. I will see her back in one month, we may even consider custom orthotics.

## 2015-02-13 ENCOUNTER — Ambulatory Visit (INDEPENDENT_AMBULATORY_CARE_PROVIDER_SITE_OTHER): Payer: Medicare Other

## 2015-02-13 DIAGNOSIS — M25462 Effusion, left knee: Secondary | ICD-10-CM | POA: Diagnosis not present

## 2015-02-13 DIAGNOSIS — M25561 Pain in right knee: Secondary | ICD-10-CM

## 2015-02-13 DIAGNOSIS — M1712 Unilateral primary osteoarthritis, left knee: Secondary | ICD-10-CM | POA: Diagnosis not present

## 2015-03-17 ENCOUNTER — Encounter: Payer: Self-pay | Admitting: Family Medicine

## 2015-03-17 ENCOUNTER — Ambulatory Visit (INDEPENDENT_AMBULATORY_CARE_PROVIDER_SITE_OTHER): Payer: Medicare Other | Admitting: Family Medicine

## 2015-03-17 VITALS — BP 143/88 | HR 84 | Ht 70.0 in | Wt 225.0 lb

## 2015-03-17 DIAGNOSIS — F329 Major depressive disorder, single episode, unspecified: Secondary | ICD-10-CM | POA: Diagnosis not present

## 2015-03-17 DIAGNOSIS — R110 Nausea: Secondary | ICD-10-CM | POA: Diagnosis not present

## 2015-03-17 DIAGNOSIS — F32A Depression, unspecified: Secondary | ICD-10-CM

## 2015-03-17 DIAGNOSIS — G4700 Insomnia, unspecified: Secondary | ICD-10-CM

## 2015-03-17 MED ORDER — DULOXETINE HCL 60 MG PO CPEP: 60.0000 mg | ORAL_CAPSULE | Freq: Every day | ORAL | Status: DC

## 2015-03-17 MED ORDER — ZOLPIDEM TARTRATE 5 MG PO TABS: 5.0000 mg | ORAL_TABLET | Freq: Every evening | ORAL | Status: DC | PRN

## 2015-03-17 MED ORDER — ONDANSETRON HCL 4 MG PO TABS: 4.0000 mg | ORAL_TABLET | Freq: Three times a day (TID) | ORAL | Status: DC | PRN

## 2015-03-17 NOTE — Progress Notes (Signed)
CC: Aimee Little is a 54 y.o. female is here for Medication Management   Subjective: HPI:  Follow-up depression: She tells me about a year ago she had her Cymbalta stopped and switched to Wellbutrin because she and her PCP were suspicious that Cymbalta was causing nausea. Since being on Wellbutrin she feels like her depression is been worsening. She reached tipping point recently with her son got mad at her and now she's having crying spells most days of the week. She's also lost interest in all hobbies. No thoughts or not himself or others. Should like to go back on Cymbalta.  Complaints the trazodone does not seem to be helping with falling asleep. She is difficult to both falling and staying asleep. She denies any pain or urinary habits contributing to her sleep issues. Symptoms are moderate to severe in severity.  Requesting refills on Zofran for nausea. She tells me 2 days out of the month she'll feel nauseous for no particular reason however its resolved within a few minutes after taking Zofran.   Review Of Systems Outlined In HPI  Past Medical History  Diagnosis Date  . Thyroid disease   . Depression   . Insomnia   . Fibromyalgia   . DDD (degenerative disc disease), lumbar     Past Surgical History  Procedure Laterality Date  . Abdominal hysterectomy      took uterus and both ovaries left   Family History  Problem Relation Age of Onset  . Depression Mother   . Hyperlipidemia Mother   . Hypertension Mother   . Heart attack Father   . Hypertension Sister   . Diabetes Sister   . Alcohol abuse Brother   . Hypertension Brother     History   Social History  . Marital Status: Married    Spouse Name: N/A  . Number of Children: N/A  . Years of Education: N/A   Occupational History  . Not on file.   Social History Main Topics  . Smoking status: Former Smoker -- 0.50 packs/day    Quit date: 10/02/2013  . Smokeless tobacco: Not on file  . Alcohol Use: Not on file   . Drug Use: Not on file  . Sexual Activity: Not on file   Other Topics Concern  . Not on file   Social History Narrative     Objective: BP 143/88 mmHg  Pulse 84  Ht 5\' 10"  (1.778 m)  Wt 225 lb (102.059 kg)  BMI 32.28 kg/m2  Vital signs reviewed. General: Alert and Oriented, No Acute Distress HEENT: Pupils equal, round, reactive to light. Conjunctivae clear.  External ears unremarkable.  Moist mucous membranes. Lungs: Clear and comfortable work of breathing, speaking in full sentences without accessory muscle use. Cardiac: Regular rate and rhythm.  Neuro: CN II-XII grossly intact, gait normal. Extremities: No peripheral edema.  Strong peripheral pulses.  Mental Status: Mild depression tearful. No anxiety, nor agitation. Logical though process. Skin: Warm and dry.  Assessment & Plan: Aimee Little was seen today for medication management.  Diagnoses and all orders for this visit:  Depression Orders: -     DULoxetine (CYMBALTA) 60 MG capsule; Take 1 capsule (60 mg total) by mouth daily.  Insomnia Orders: -     zolpidem (AMBIEN) 5 MG tablet; Take 1 tablet (5 mg total) by mouth at bedtime as needed for sleep.  Nausea without vomiting Orders: -     ondansetron (ZOFRAN) 4 MG tablet; Take 1 tablet (4 mg total) by mouth  every 8 (eight) hours as needed.   Depression: Uncontrolled chronic condition stopping Wellbutrin start Cymbalta. I've advised her to take only 1 capsule Cymbalta today for the first week and then start taking twice a day. Insomnia: Uncontrolled chronic condition. Trazodone switch to Ambien, risk of sleepwalking discussed Nausea: Controlled with as needed Zofran  Return if symptoms worsen or fail to improve.

## 2015-04-07 ENCOUNTER — Other Ambulatory Visit: Payer: Self-pay | Admitting: Physician Assistant

## 2015-04-07 ENCOUNTER — Telehealth: Payer: Self-pay | Admitting: Family Medicine

## 2015-04-07 MED ORDER — KETOROLAC TROMETHAMINE 10 MG PO TABS: 10.0000 mg | ORAL_TABLET | Freq: Four times a day (QID) | ORAL | Status: DC | PRN

## 2015-04-07 NOTE — Telephone Encounter (Signed)
Filled med but needs to f/u with Luvenia Starch in August for her migraines which will be a 6 month f/u for migraines

## 2015-04-07 NOTE — Telephone Encounter (Signed)
Pt called office requesting a refill on the Toradol Rx. States she hasn't gotten this Rx since Feb. 2016 but she would only use it when her other migraine Rx doesn't help. Pt has been having a constant migraine for the past 3 days and it is not getting any better. Pt requested an appt to get an injection and advised she could visit UC, states she "doesn't want to go there" and that she "doesn't even want to leave her apartment." Advised since it has been so long since her last refill I would have to route to a Provider in office for review. Verbalized understanding.

## 2015-04-10 ENCOUNTER — Telehealth: Payer: Self-pay | Admitting: Physician Assistant

## 2015-04-10 NOTE — Telephone Encounter (Signed)
Received fax for prior authorization on Ketorolac 10 mg sent through cover my meds waiting on authorization. - CF

## 2015-04-10 NOTE — Telephone Encounter (Signed)
Pt advised of Rx and f/u appt was scheduled.

## 2015-04-11 NOTE — Telephone Encounter (Signed)
Received fax with more questions I filled out form and faxed back to [facility]. - CF

## 2015-04-12 NOTE — Telephone Encounter (Signed)
Received fax from Medicare and they denied coverage of Ketorolac due to the use is not supported by FDA or on e of the Medicare approved references for treating migraines. Reference # [id number]. - CF

## 2015-04-13 ENCOUNTER — Encounter: Payer: Self-pay | Admitting: Family Medicine

## 2015-04-13 ENCOUNTER — Ambulatory Visit (INDEPENDENT_AMBULATORY_CARE_PROVIDER_SITE_OTHER): Payer: Medicare Other | Admitting: Family Medicine

## 2015-04-13 VITALS — BP 136/84 | HR 98 | Ht 70.0 in | Wt 223.0 lb

## 2015-04-13 DIAGNOSIS — J40 Bronchitis, not specified as acute or chronic: Secondary | ICD-10-CM | POA: Insufficient documentation

## 2015-04-13 DIAGNOSIS — J0100 Acute maxillary sinusitis, unspecified: Secondary | ICD-10-CM | POA: Diagnosis not present

## 2015-04-13 DIAGNOSIS — J329 Chronic sinusitis, unspecified: Secondary | ICD-10-CM | POA: Insufficient documentation

## 2015-04-13 MED ORDER — AMOXICILLIN-POT CLAVULANATE 875-125 MG PO TABS: 1.0000 | ORAL_TABLET | Freq: Two times a day (BID) | ORAL | Status: DC

## 2015-04-13 MED ORDER — IPRATROPIUM BROMIDE 0.06 % NA SOLN: 2.0000 | Freq: Four times a day (QID) | NASAL | Status: DC

## 2015-04-13 MED ORDER — METHYLPREDNISOLONE ACETATE 80 MG/ML IJ SUSP
80.0000 mg | Freq: Once | INTRAMUSCULAR | Status: AC
  Administered 2015-04-13: 80 mg via INTRAMUSCULAR

## 2015-04-13 MED ORDER — KETOROLAC TROMETHAMINE 60 MG/2ML IM SOLN
60.0000 mg | Freq: Once | INTRAMUSCULAR | Status: AC
  Administered 2015-04-13: 60 mg via INTRAMUSCULAR

## 2015-04-13 NOTE — Assessment & Plan Note (Signed)
Sinusitis likely allergic versus viral. Continue Flonase nasal spray. Additionally use Atrovent nasal spray. Toradol 60 mg IM and Depo-Medrol 80 mg IM injections in clinic today. Backup Augmentin antibiotics for use if not better.   New Problem moderate complexity.

## 2015-04-13 NOTE — Patient Instructions (Signed)
Thank you for coming in today. Use the Atrovent nasal spray with Flonase nasal spray. Take Benadryl or Zyrtec. Use Augmentin if not better. Return as needed. Call or go to the emergency room if you get worse, have trouble breathing, have chest pains, or palpitations.  Sinusitis Sinusitis is redness, soreness, and inflammation of the paranasal sinuses. Paranasal sinuses are air pockets within the bones of your face (beneath the eyes, the middle of the forehead, or above the eyes). In healthy paranasal sinuses, mucus is able to drain out, and air is able to circulate through them by way of your nose. However, when your paranasal sinuses are inflamed, mucus and air can become trapped. This can allow bacteria and other germs to grow and cause infection. Sinusitis can develop quickly and last only a short time (acute) or continue over a long period (chronic). Sinusitis that lasts for more than 12 weeks is considered chronic.  CAUSES  Causes of sinusitis include:  Allergies.  Structural abnormalities, such as displacement of the cartilage that separates your nostrils (deviated septum), which can decrease the air flow through your nose and sinuses and affect sinus drainage.  Functional abnormalities, such as when the small hairs (cilia) that line your sinuses and help remove mucus do not work properly or are not present. SIGNS AND SYMPTOMS  Symptoms of acute and chronic sinusitis are the same. The primary symptoms are pain and pressure around the affected sinuses. Other symptoms include:  Upper toothache.  Earache.  Headache.  Bad breath.  Decreased sense of smell and taste.  A cough, which worsens when you are lying flat.  Fatigue.  Fever.  Thick drainage from your nose, which often is green and may contain pus (purulent).  Swelling and warmth over the affected sinuses. DIAGNOSIS  Your health care provider will perform a physical exam. During the exam, your health care provider  may:  Look in your nose for signs of abnormal growths in your nostrils (nasal polyps).  Tap over the affected sinus to check for signs of infection.  View the inside of your sinuses (endoscopy) using an imaging device that has a light attached (endoscope). If your health care provider suspects that you have chronic sinusitis, one or more of the following tests may be recommended:  Allergy tests.  Nasal culture. A sample of mucus is taken from your nose, sent to a lab, and screened for bacteria.  Nasal cytology. A sample of mucus is taken from your nose and examined by your health care provider to determine if your sinusitis is related to an allergy. TREATMENT  Most cases of acute sinusitis are related to a viral infection and will resolve on their own within 10 days. Sometimes medicines are prescribed to help relieve symptoms (pain medicine, decongestants, nasal steroid sprays, or saline sprays).  However, for sinusitis related to a bacterial infection, your health care provider will prescribe antibiotic medicines. These are medicines that will help kill the bacteria causing the infection.  Rarely, sinusitis is caused by a fungal infection. In theses cases, your health care provider will prescribe antifungal medicine. For some cases of chronic sinusitis, surgery is needed. Generally, these are cases in which sinusitis recurs more than 3 times per year, despite other treatments. HOME CARE INSTRUCTIONS   Drink plenty of water. Water helps thin the mucus so your sinuses can drain more easily.  Use a humidifier.  Inhale steam 3 to 4 times a day (for example, sit in the bathroom with the shower running).  Apply a warm, moist washcloth to your face 3 to 4 times a day, or as directed by your health care provider.  Use saline nasal sprays to help moisten and clean your sinuses.  Take medicines only as directed by your health care provider.  If you were prescribed either an antibiotic or  antifungal medicine, finish it all even if you start to feel better. SEEK IMMEDIATE MEDICAL CARE IF:  You have increasing pain or severe headaches.  You have nausea, vomiting, or drowsiness.  You have swelling around your face.  You have vision problems.  You have a stiff neck.  You have difficulty breathing. MAKE SURE YOU:   Understand these instructions.  Will watch your condition.  Will get help right away if you are not doing well or get worse. Document Released: 09/16/2005 Document Revised: 01/31/2014 Document Reviewed: 10/01/2011 [facility] Patient Information 2015 [place], [place]. This information is not intended to replace advice given to you by your health care provider. Make sure you discuss any questions you have with your health care provider.

## 2015-04-13 NOTE — Progress Notes (Signed)
Aimee Little is a 54 y.o. female who presents to [facility]  today for sinus pain and pressure associated with bilateral frontal headache. Symptoms present for 1.5 weeks. Symptoms worsened over the past 3 days or so. She has tried Benadryl Flonase and Tylenol for the last 3 days which have helped some. She notes a postnasal drainage sensation. Symptoms are moderate. No fevers chills nausea vomiting diarrhea trouble breathing or abdominal pain.   Past Medical History  Diagnosis Date  . Thyroid disease   . Depression   . Insomnia   . Fibromyalgia   . DDD (degenerative disc disease), lumbar    Past Surgical History  Procedure Laterality Date  . Abdominal hysterectomy      took uterus and both ovaries left   History  Substance Use Topics  . Smoking status: Former Smoker -- 0.50 packs/day    Quit date: 10/02/2013  . Smokeless tobacco: Not on file  . Alcohol Use: Not on file   ROS as above Medications: Current Outpatient Prescriptions  Medication Sig Dispense Refill  . AMBULATORY NON FORMULARY MEDICATION Compression stockings, please fit to size, pressure 20-42mmHg one pair one refill. Dx Venous Insufficiency 1 Units 1  . baclofen (LIORESAL) 10 MG tablet TAKE ONE TABLET BY MOUTH 3 TIMES A DAY 30 tablet 2  . Cyanocobalamin (VITAMIN B 12) 100 MCG LOZG Take 1 tablet by mouth.    . DEXILANT 60 MG capsule TAKE 1 CAPSULE BY MOUTH ONCE DAILY 30 capsule 0  . DULoxetine (CYMBALTA) 60 MG capsule Take 1 capsule (60 mg total) by mouth daily. 60 capsule 3  . fluticasone (FLONASE) 50 MCG/ACT nasal spray USE 1 TO 2 SPRAYS IN EACH NOSTRIL ONCE DAILY. 16 g 6  . furosemide (LASIX) 20 MG tablet One to two tablets at noon daily as needed only for fluid in legs. 60 tablet 1  . gabapentin (NEURONTIN) 300 MG capsule Take 1 capsule (300 mg total) by mouth 3 (three) times daily. 90 capsule 1  . ketorolac (TORADOL) 10 MG tablet Take 1 tablet (10 mg total) by mouth every 6  (six) hours as needed. 20 tablet 0  . levothyroxine (SYNTHROID, LEVOTHROID) 137 MCG tablet TAKE ONE TABLET BY MOUTH EVERY DAY BEFORE BREAKFAST 30 tablet 5  . Naproxen-Esomeprazole 500-20 MG TBEC Take 1 tablet by mouth 2 (two) times daily. 60 tablet 3  . ondansetron (ZOFRAN) 4 MG tablet Take 1 tablet (4 mg total) by mouth every 8 (eight) hours as needed. 30 tablet 4  . ranitidine (ZANTAC) 300 MG tablet Take 1 tablet (300 mg total) by mouth 2 (two) times daily. 180 tablet 3  . rizatriptan (MAXALT-MLT) 10 MG disintegrating tablet TAKE ONE TABLET BY MOUTH AS NEEDED. MAY REPEAT IN 2 HOURS IF NEEDED. 10 tablet 5  . topiramate (TOPAMAX) 50 MG tablet TAKE ONE TABLET BY MOUTH 2 TIMES A DAY 60 tablet 0  . traMADol (ULTRAM) 50 MG tablet TAKE ONE TABLET EVERY 4 TO 6 HOURS AS NEEDED FOR PAIN 60 tablet 0  . Vitamin D, Ergocalciferol, (DRISDOL) 50000 UNITS CAPS capsule Take 1 capsule (50,000 Units total) by mouth every 7 (seven) days. 12 capsule 0  . zolpidem (AMBIEN) 5 MG tablet Take 1 tablet (5 mg total) by mouth at bedtime as needed for sleep. 30 tablet 0  . amoxicillin-clavulanate (AUGMENTIN) 875-125 MG per tablet Take 1 tablet by mouth 2 (two) times daily. 14 tablet 0  . ipratropium (ATROVENT) 0.06 % nasal spray Place  2 sprays into both nostrils 4 (four) times daily. 15 mL 1   Current Facility-Administered Medications  Medication Dose Route Frequency Provider Last Rate Last Dose  . ketorolac (TORADOL) injection 60 mg  60 mg Intramuscular Once Gregor Hams, MD      . methylPREDNISolone acetate (DEPO-MEDROL) injection 80 mg  80 mg Intramuscular Once Gregor Hams, MD       Allergies  Allergen Reactions  . Doxepin Other (See Comments)  . Gabapentin     Sleep walking  . Mirapex [Pramipexole Dihydrochloride]     Nausea and vomiting  . Phentermine     Stomach ache/increased anxiety.   . Pregabalin      Exam:  BP 136/84 mmHg  Pulse 98  Ht 5\' 10"  (1.778 m)  Wt 223 lb (101.152 kg)  BMI 32.00  kg/m2 Gen: Well NAD HEENT: EOMI,  MMM inflamed nasal turbinates bilaterally. Normal tympanic membranes bilaterally. Posterior pharynx with cobblestoning. Mild cervical lymphadenopathy is present bilaterally. Frontal and maxillary sinuses are mildly tender bilaterally. Clear nasal discharge is present. Lungs: Normal work of breathing. CTABL Heart: RRR no MRG Abd: NABS, Soft. Nondistended, Nontender Exts: Brisk capillary refill, warm and well perfused.   No results found for this or any previous visit (from the past 24 hour(s)). No results found.   Please see individual assessment and plan sections. This visit required moderate complexity and decision making.

## 2015-04-14 ENCOUNTER — Telehealth: Payer: Self-pay | Admitting: Family Medicine

## 2015-04-14 NOTE — Telephone Encounter (Signed)
Please call patient and let her know that the ketorolac that we refilled for her was not covered by her insurance.

## 2015-04-15 ENCOUNTER — Emergency Department (INDEPENDENT_AMBULATORY_CARE_PROVIDER_SITE_OTHER): Payer: Medicare Other

## 2015-04-15 ENCOUNTER — Emergency Department (INDEPENDENT_AMBULATORY_CARE_PROVIDER_SITE_OTHER)
Admission: EM | Admit: 2015-04-15 | Discharge: 2015-04-15 | Disposition: A | Discharge status: Home / Self Care | Payer: Medicare Other | Source: Home / Self Care | Attending: Family Medicine | Admitting: Family Medicine

## 2015-04-15 DIAGNOSIS — R51 Headache: Secondary | ICD-10-CM

## 2015-04-15 DIAGNOSIS — R0981 Nasal congestion: Secondary | ICD-10-CM

## 2015-04-15 DIAGNOSIS — J3489 Other specified disorders of nose and nasal sinuses: Secondary | ICD-10-CM | POA: Diagnosis not present

## 2015-04-15 DIAGNOSIS — R519 Headache, unspecified: Secondary | ICD-10-CM

## 2015-04-15 MED ORDER — KETOROLAC TROMETHAMINE 60 MG/2ML IM SOLN
60.0000 mg | Freq: Once | INTRAMUSCULAR | Status: AC
Start: 2015-04-15 — End: 2015-04-15
  Administered 2015-04-15: 60 mg via INTRAMUSCULAR

## 2015-04-15 MED ORDER — METOCLOPRAMIDE HCL 5 MG/ML IJ SOLN
5.0000 mg | Freq: Once | INTRAMUSCULAR | Status: AC
  Administered 2015-04-15: 5 mg via INTRAMUSCULAR

## 2015-04-15 MED ORDER — ISOMETHEPTENE-DICHLORAL-APAP 65-100-325 MG PO CAPS: ORAL_CAPSULE | ORAL | Status: DC

## 2015-04-15 NOTE — ED Notes (Signed)
Patient states that she has had a headache since Thursday, she was seen at PCP on Thursday and was treated for sinus infection/ sinus headache but has had no relief

## 2015-04-15 NOTE — Discharge Instructions (Signed)
Stay well-hydrated.  Avoid heat.  May continue Zofran for nausea as needed.   If symptoms become significantly worse during the night or over the weekend, proceed to the local emergency room.

## 2015-04-15 NOTE — ED Provider Notes (Signed)
CSN: [id number]     Arrival date & time 04/15/15  4098 History   First MD Initiated Contact with Patient 04/15/15 [phone]     Chief Complaint  Patient presents with  . Headache      HPI Comments: Patient complains of onset of sinus congestion about two weeks ago that has persisted.  She had no associated URI symptoms.  She developed increasing facial pressure and headache, and visited her PCP two days ago who administered Toradol 60mg , DepoMedrol 80mg , and Rx for Augmentin.  Patient reports that her headache has persisted.  No fevers, chills, and sweats.  No neurologic symptoms.  She reports that she is able to sleep, and the headache does not awaken her.  She states that she has had similar severe headaches in the past:  Two last year, and three the year prior.  No formal diagnosis of migraine headaches.  Patient is a 54 y.o. female presenting with headaches. The history is provided by the patient.  Headache Pain location:  Frontal Quality:  Dull Radiates to:  Does not radiate Severity currently:  9/10 Severity at highest:  10/10 Onset quality:  Gradual Duration:  3 days Timing:  Constant Progression:  Unchanged Chronicity:  Recurrent Similar to prior headaches: yes   Context: bright light   Relieved by:  Nothing Worsened by:  Light Ineffective treatments: Toradol and steroid injection. Associated symptoms: congestion, drainage, facial pain, nausea, photophobia and sinus pressure   Associated symptoms: no blurred vision, no cough, no ear pain, no fatigue, no fever, no focal weakness, no hearing loss, no myalgias, no neck pain, no neck stiffness, no numbness, no paresthesias, no sore throat, no swollen glands, no URI, no visual change and no vomiting     Past Medical History  Diagnosis Date  . Thyroid disease   . Depression   . Insomnia   . Fibromyalgia   . DDD (degenerative disc disease), lumbar    Past Surgical History  Procedure Laterality Date  . Abdominal hysterectomy     took uterus and both ovaries left   Family History  Problem Relation Age of Onset  . Depression Mother   . Hyperlipidemia Mother   . Hypertension Mother   . Heart attack Father   . Hypertension Sister   . Diabetes Sister   . Alcohol abuse Brother   . Hypertension Brother    History  Substance Use Topics  . Smoking status: Former Smoker -- 0.50 packs/day    Quit date: 10/02/2013  . Smokeless tobacco: Not on file  . Alcohol Use: Not on file   OB History    No data available     Review of Systems  Constitutional: Negative for fever and fatigue.  HENT: Positive for congestion, postnasal drip and sinus pressure. Negative for ear pain, hearing loss and sore throat.   Eyes: Positive for photophobia. Negative for blurred vision.  Respiratory: Negative for cough.   Gastrointestinal: Positive for nausea. Negative for vomiting.  Musculoskeletal: Negative for myalgias, neck pain and neck stiffness.  Neurological: Positive for headaches. Negative for focal weakness, numbness and paresthesias.  All other systems reviewed and are negative.   Allergies  Doxepin; Gabapentin; Mirapex; Phentermine; and Pregabalin  Home Medications   Prior to Admission medications   Medication Sig Start Date End Date Taking? Authorizing Provider  AMBULATORY NON FORMULARY MEDICATION Compression stockings, please fit to size, pressure 20-38mmHg one pair one refill. Dx Venous Insufficiency 02/18/13  Yes Sean Hommel, DO  amoxicillin-clavulanate (AUGMENTIN) 875-125 MG  per tablet Take 1 tablet by mouth 2 (two) times daily. 04/13/15  Yes Gregor Hams, MD  baclofen (LIORESAL) 10 MG tablet TAKE ONE TABLET BY MOUTH 3 TIMES A DAY 02/10/15  Yes Sean Hommel, DO  Cyanocobalamin (VITAMIN B 12) 100 MCG LOZG Take 1 tablet by mouth.   Yes Historical Provider, MD  DEXILANT 60 MG capsule TAKE 1 CAPSULE BY MOUTH ONCE DAILY 04/07/15  Yes [name] L Breeback, PA-C  DULoxetine (CYMBALTA) 60 MG capsule Take 1 capsule (60 mg total) by mouth  daily. 03/17/15  Yes Sean Hommel, DO  fluticasone (FLONASE) 50 MCG/ACT nasal spray USE 1 TO 2 SPRAYS IN EACH NOSTRIL ONCE DAILY. 08/08/14  Yes Sean Hommel, DO  furosemide (LASIX) 20 MG tablet One to two tablets at noon daily as needed only for fluid in legs. 07/21/13 07/22/15 Yes [name] L Breeback, PA-C  gabapentin (NEURONTIN) 300 MG capsule Take 1 capsule (300 mg total) by mouth 3 (three) times daily. 08/24/14  Yes [name] L Breeback, PA-C  ipratropium (ATROVENT) 0.06 % nasal spray Place 2 sprays into both nostrils 4 (four) times daily. 04/13/15  Yes Gregor Hams, MD  ketorolac (TORADOL) 10 MG tablet Take 1 tablet (10 mg total) by mouth every 6 (six) hours as needed. 04/07/15  Yes Hali Marry, MD  levothyroxine (SYNTHROID, LEVOTHROID) 137 MCG tablet TAKE ONE TABLET BY MOUTH EVERY DAY BEFORE BREAKFAST 11/21/14  Yes [name] L Breeback, PA-C  Naproxen-Esomeprazole 500-20 MG TBEC Take 1 tablet by mouth 2 (two) times daily. 02/10/15  Yes Silverio Decamp, MD  ondansetron (ZOFRAN) 4 MG tablet Take 1 tablet (4 mg total) by mouth every 8 (eight) hours as needed. 03/17/15  Yes Sean Hommel, DO  ranitidine (ZANTAC) 300 MG tablet Take 1 tablet (300 mg total) by mouth 2 (two) times daily. 01/21/14  Yes [name] L Breeback, PA-C  rizatriptan (MAXALT-MLT) 10 MG disintegrating tablet TAKE ONE TABLET BY MOUTH AS NEEDED. MAY REPEAT IN 2 HOURS IF NEEDED. 11/25/14  Yes [name] L Breeback, PA-C  topiramate (TOPAMAX) 50 MG tablet TAKE ONE TABLET BY MOUTH 2 TIMES A DAY 04/07/15  Yes [name] L Breeback, PA-C  traMADol (ULTRAM) 50 MG tablet TAKE ONE TABLET EVERY 4 TO 6 HOURS AS NEEDED FOR PAIN 02/10/15  Yes Sean Hommel, DO  Vitamin D, Ergocalciferol, (DRISDOL) 50000 UNITS CAPS capsule Take 1 capsule (50,000 Units total) by mouth every 7 (seven) days. 10/18/14  Yes [name] L Breeback, PA-C  zolpidem (AMBIEN) 5 MG tablet Take 1 tablet (5 mg total) by mouth at bedtime as needed for sleep. 03/17/15  Yes Marcial Pacas, DO    isometheptene-acetaminophen-dichloralphenazone (MIDRIN) 65-100-325 MG capsule Take one capsule PO q4hr prn headache.  Maximum 8 capsules in 24 hours 04/15/15   Kandra Nicolas, MD   BP 135/79 mmHg  Pulse 106  Temp(Src) 98.6 F (37 C) (Oral)  Ht 5\' 10"  (1.778 m)  Wt 224 lb (101.606 kg)  BMI 32.14 kg/m2  SpO2 96% Physical Exam  Constitutional: She is oriented to person, place, and time. She appears well-developed and well-nourished.  Patient is obese (BMI 32.1)  HENT:  Head: Normocephalic.    Right Ear: Tympanic membrane and external ear normal.  Left Ear: Tympanic membrane and external ear normal.  Nose: Nose normal.  Mouth/Throat: Oropharynx is clear and moist.  There is distinct tenderness to palpation over the frontal sinuses. No tenderness to palpation over temporal arteries.  Eyes: Conjunctivae and EOM are normal. Pupils are equal, round, and  reactive to light.  Neck: Neck supple.  Cardiovascular: Normal heart sounds.   Pulmonary/Chest: Breath sounds normal.  Abdominal: There is no tenderness.  Lymphadenopathy:    She has cervical adenopathy.  Neurological: She is alert and oriented to person, place, and time. She has normal reflexes. She displays normal reflexes. No cranial nerve deficit. Coordination normal.  Skin: Skin is warm and dry.  Nursing note and vitals reviewed.   ED Course  Procedures  none  Imaging Review Dg Sinuses Complete  04/15/2015   CLINICAL DATA:  Headache.  Sinus congestion  EXAM: PARANASAL SINUSES - COMPLETE 3 + VIEW  COMPARISON:  None.  FINDINGS: The paranasal sinus are aerated. There is no evidence of sinus opacification air-fluid levels or mucosal thickening. No significant bone abnormalities are seen.  IMPRESSION: Negative.   Electronically Signed   By: Kerby Moors M.D.   On: 04/15/2015 10:34     MDM   1. Headache, unspecified headache type; suspect migraine (note normal neurologic exam)    Toradol 60mg  IM.  Reglan 5mg  IM (Note that  patient had DepoMedrol injection two days ago) Trial of Midrin. Stay well-hydrated.  Avoid heat.  May continue Zofran for nausea as needed.   If symptoms become significantly worse during the night or over the weekend, proceed to the local emergency room.  Followup with Family Doctor if not improved in about 4 days.    Kandra Nicolas, MD 04/15/15 [phone]

## 2015-04-17 MED ORDER — BUTALBITAL-APAP-CAFFEINE 50-300-40 MG PO CAPS: 1.0000 | ORAL_CAPSULE | Freq: Two times a day (BID) | ORAL | Status: DC | PRN

## 2015-04-17 NOTE — Telephone Encounter (Signed)
She can try taking a magnesium supplement daily to prevent headaches and we can try Fioricet.  I will send over Rx.

## 2015-04-17 NOTE — Telephone Encounter (Signed)
Patient states she still has a migraine. She has had one for 3 weeks. She has ran out of Imitrex and cannot get more for a couple weeks. What can she take for the migraine?

## 2015-04-17 NOTE — Telephone Encounter (Signed)
Patient advised.

## 2015-04-18 ENCOUNTER — Telehealth: Payer: Self-pay | Admitting: Physician Assistant

## 2015-04-18 NOTE — Telephone Encounter (Signed)
Received fax from [facility] for prior authorization on Fioricet 50-300-40 mg capsules I called [employer] and spoke with Ronny Bacon to initiate the prior authorization and now I am waiting on determination. - CF

## 2015-04-19 ENCOUNTER — Telehealth: Payer: Self-pay | Admitting: Family Medicine

## 2015-04-19 ENCOUNTER — Telehealth: Payer: Self-pay | Admitting: Physician Assistant

## 2015-04-19 NOTE — Telephone Encounter (Signed)
Received standard appeal form from [employer] by fax. I filled out form and placed in Dr. Gardiner Ramus box for signature will fax back when signed. - CF

## 2015-04-19 NOTE — Telephone Encounter (Signed)
lvm informing pt of coupon card and that she can p/u and take to [facility]. Aimee Little, Lahoma Crocker

## 2015-04-19 NOTE — Telephone Encounter (Signed)
Please call patient and let her know that they will not pay for her Fioricet with her current insurance. We do have a coupon card that she can come by and pick up and take 2 [facility] to get the prescription for $24 if she wants to pay cash for it.

## 2015-04-24 NOTE — Telephone Encounter (Signed)
I Called [facility] and medication was denied due to being prescribed for a diagnosis not supported by the FDA or Medicare Guidelines and it is a high risk drug for patients over the age of 83. - CF

## 2015-04-25 NOTE — Telephone Encounter (Signed)
Please call patient and let her know that her insurance will not pay for the Fioricet. She will just mostly have to rely on her Imitrex. We can also always consider increasing her Topamax for better control of her migraines if needed. We did try to a prior authorization for the Fioricet but it was completely denied.

## 2015-04-25 NOTE — Telephone Encounter (Signed)
Notified pt of ins denial.  She is going to have the pharmacy run it through her medicaid only & see if she can get it that way.  She said to tell you thanks for all your help with everything.

## 2015-05-01 ENCOUNTER — Other Ambulatory Visit: Payer: Self-pay | Admitting: Physician Assistant

## 2015-05-02 ENCOUNTER — Telehealth: Payer: Self-pay | Admitting: Physician Assistant

## 2015-05-02 DIAGNOSIS — G43119 Migraine with aura, intractable, without status migrainosus: Secondary | ICD-10-CM | POA: Diagnosis not present

## 2015-05-02 NOTE — Telephone Encounter (Signed)
Received fax from [facility] and after reviewing the appeal they approved the Ketorolac from 04/10/2015 - 09/30/2015 Authorization # [id number]. - CF

## 2015-05-05 ENCOUNTER — Telehealth: Payer: Self-pay | Admitting: *Deleted

## 2015-05-05 NOTE — Telephone Encounter (Signed)
LMOM for pt to return call. 

## 2015-05-05 NOTE — Telephone Encounter (Signed)
I believe 10 is the maximum amount per month. She is welcome to call her insurance to see if they'll pay for more such as 11 or 12 tabs per month but I actually think it is 10. If she finds out differently she can let us know. If she's having more frequent headaches that require more than 10 tabs a month that she really needs to add a prophylactic medication for migraines. I'll be happy to send over prescription for propranolol which is a type of medication that is used to help prevent headaches. Have her know though that she may have to take it for 6-8 weeks to really see a reduction in the frequency of headaches.

## 2015-05-05 NOTE — Telephone Encounter (Signed)
Pt left vm asking if you could refill the maximum amount of maxalt that is allowed.  Apparently #10 is not enough.  Please advise & I will send it in for her.

## 2015-05-12 ENCOUNTER — Ambulatory Visit: Payer: Medicare Other | Admitting: Family Medicine

## 2015-05-19 ENCOUNTER — Ambulatory Visit: Payer: Medicare Other | Admitting: Physician Assistant

## 2015-05-19 DIAGNOSIS — G43119 Migraine with aura, intractable, without status migrainosus: Secondary | ICD-10-CM | POA: Diagnosis not present

## 2015-05-23 ENCOUNTER — Ambulatory Visit: Payer: Medicare Other | Admitting: Osteopathic Medicine

## 2015-05-26 ENCOUNTER — Ambulatory Visit (INDEPENDENT_AMBULATORY_CARE_PROVIDER_SITE_OTHER): Payer: Medicare Other | Admitting: Osteopathic Medicine

## 2015-05-26 ENCOUNTER — Encounter: Payer: Self-pay | Admitting: Osteopathic Medicine

## 2015-05-26 VITALS — BP 138/74 | HR 75 | Temp 98.3°F | Wt 228.0 lb

## 2015-05-26 DIAGNOSIS — G629 Polyneuropathy, unspecified: Secondary | ICD-10-CM

## 2015-05-26 DIAGNOSIS — M62838 Other muscle spasm: Secondary | ICD-10-CM

## 2015-05-26 DIAGNOSIS — Z79899 Other long term (current) drug therapy: Secondary | ICD-10-CM

## 2015-05-26 DIAGNOSIS — F32A Depression, unspecified: Secondary | ICD-10-CM

## 2015-05-26 DIAGNOSIS — R5382 Chronic fatigue, unspecified: Secondary | ICD-10-CM | POA: Insufficient documentation

## 2015-05-26 DIAGNOSIS — E039 Hypothyroidism, unspecified: Secondary | ICD-10-CM | POA: Diagnosis not present

## 2015-05-26 DIAGNOSIS — R5383 Other fatigue: Secondary | ICD-10-CM | POA: Diagnosis not present

## 2015-05-26 DIAGNOSIS — G894 Chronic pain syndrome: Secondary | ICD-10-CM

## 2015-05-26 DIAGNOSIS — F329 Major depressive disorder, single episode, unspecified: Secondary | ICD-10-CM | POA: Diagnosis not present

## 2015-05-26 DIAGNOSIS — G43001 Migraine without aura, not intractable, with status migrainosus: Secondary | ICD-10-CM

## 2015-05-26 DIAGNOSIS — M797 Fibromyalgia: Secondary | ICD-10-CM

## 2015-05-26 MED ORDER — GABAPENTIN 300 MG PO CAPS: 300.0000 mg | ORAL_CAPSULE | Freq: Three times a day (TID) | ORAL | Status: DC | PRN

## 2015-05-26 MED ORDER — TRAMADOL HCL 50 MG PO TABS: ORAL_TABLET | ORAL | Status: DC

## 2015-05-26 MED ORDER — LEVOTHYROXINE SODIUM 137 MCG PO TABS: 137.0000 ug | ORAL_TABLET | Freq: Every day | ORAL | Status: DC

## 2015-05-26 MED ORDER — DULOXETINE HCL 60 MG PO CPEP: 60.0000 mg | ORAL_CAPSULE | Freq: Every day | ORAL | Status: DC

## 2015-05-26 MED ORDER — BACLOFEN 10 MG PO TABS: ORAL_TABLET | ORAL | Status: DC

## 2015-05-26 NOTE — Progress Notes (Signed)
HPI: Aimee Little is a 54 y.o. female who presents to [facility]  today for chief complaint of: chills and fatigue, has bene out of some meds and needs refills on other medications. Thinks has been out of cymbalta.  When asked to describe her chief complaint of chills, she is able to definitively characterize these symptoms. Asked thickly about cold intolerance, she does complain about this. Asked about tremors, she denies this. She states her biggest problem is significant increase in fatigue over the past week. She states tramadol can sometimes give her chills however she doesn't think she has been taking enough of it to cause this.   Endocrine: She is overdue for recheck of thyroid labs. She reports cold intolerance lately over the course of the past week She describes increasing worsening fatigue, especially with heat outside.   Neurologic/Musculoskeletal: History of fibromyalgia, worseningno significant localized pain, just states that she feels sore all over, chronic back pain for which she has been taking tramadol  Psychiatric: . She has been out of Cymbalta but she cannot tell me for how long. Reports depressed mood, no thoughts of hurting self/others.      Past medical, social and family history reviewed: Past Medical History  Diagnosis Date  . Thyroid disease   . Depression   . Insomnia   . Fibromyalgia   . DDD (degenerative disc disease), lumbar    Past Surgical History  Procedure Laterality Date  . Abdominal hysterectomy      took uterus and both ovaries left   Social History  Substance Use Topics  . Smoking status: Former Smoker -- 0.50 packs/day    Quit date: 10/02/2013  . Smokeless tobacco: Not on file  . Alcohol Use: Not on file   Family History  Problem Relation Age of Onset  . Depression Mother   . Hyperlipidemia Mother   . Hypertension Mother   . Heart attack Father   . Hypertension Sister   . Diabetes Sister   .  Alcohol abuse Brother   . Hypertension Brother     Current Outpatient Prescriptions  Medication Sig Dispense Refill  . AMBULATORY NON FORMULARY MEDICATION Compression stockings, please fit to size, pressure 20-73mmHg one pair one refill. Dx Venous Insufficiency 1 Units 1  . baclofen (LIORESAL) 10 MG tablet TAKE ONE TABLET BY MOUTH 3 TIMES A DAY 30 tablet 2  . botulinum toxin Type A (BOTOX) 100 UNITS SOLR injection Inject 100 Units into the muscle.    Marland Kitchen buPROPion (WELLBUTRIN XL) 150 MG 24 hr tablet     . Butalbital-APAP-Caffeine (FIORICET) 50-300-40 MG CAPS Take 1 tablet by mouth 2 (two) times daily as needed. 60 capsule 0  . Cyanocobalamin (VITAMIN B 12) 100 MCG LOZG Take 1 tablet by mouth.    . DEXILANT 60 MG capsule TAKE 1 CAPSULE BY MOUTH ONCE DAILY 30 capsule 0  . DULoxetine (CYMBALTA) 60 MG capsule Take 1 capsule (60 mg total) by mouth daily. 60 capsule 3  . fluticasone (FLONASE) 50 MCG/ACT nasal spray USE 1 TO 2 SPRAYS IN EACH NOSTRIL ONCE DAILY. 16 g 6  . furosemide (LASIX) 20 MG tablet One to two tablets at noon daily as needed only for fluid in legs. 60 tablet 1  . gabapentin (NEURONTIN) 300 MG capsule Take 1 capsule (300 mg total) by mouth 3 (three) times daily. 90 capsule 1  . ipratropium (ATROVENT) 0.06 % nasal spray Place 2 sprays into both nostrils 4 (four) times daily.  15 mL 1  . isometheptene-acetaminophen-dichloralphenazone (MIDRIN) 65-100-325 MG capsule Take one capsule PO q4hr prn headache.  Maximum 8 capsules in 24 hours 20 capsule 0  . ketorolac (TORADOL) 10 MG tablet Take 1 tablet (10 mg total) by mouth every 6 (six) hours as needed. 20 tablet 0  . levothyroxine (SYNTHROID, LEVOTHROID) 137 MCG tablet TAKE ONE TABLET BY MOUTH EVERY DAY BEFORE BREAKFAST 30 tablet 0  . Naproxen-Esomeprazole 500-20 MG TBEC Take 1 tablet by mouth 2 (two) times daily. 60 tablet 3  . ondansetron (ZOFRAN) 4 MG tablet Take 1 tablet (4 mg total) by mouth every 8 (eight) hours as needed. 30 tablet 4   . ranitidine (ZANTAC) 300 MG tablet Take 1 tablet (300 mg total) by mouth 2 (two) times daily. 180 tablet 3  . rizatriptan (MAXALT-MLT) 10 MG disintegrating tablet TAKE ONE TABLET BY MOUTH AS NEEDED. MAY REPEAT IN 2 HOURS IF NEEDED. 10 tablet 5  . topiramate (TOPAMAX) 50 MG tablet TAKE ONE TABLET BY MOUTH 2 TIMES A DAY 60 tablet 0  . traMADol (ULTRAM) 50 MG tablet TAKE ONE TABLET EVERY 4 TO 6 HOURS AS NEEDED FOR PAIN 60 tablet 0  . Vitamin D, Ergocalciferol, (DRISDOL) 50000 UNITS CAPS capsule Take 1 capsule (50,000 Units total) by mouth every 7 (seven) days. 12 capsule 0  . zolpidem (AMBIEN) 5 MG tablet Take 1 tablet (5 mg total) by mouth at bedtime as needed for sleep. 30 tablet 0   No current facility-administered medications for this visit.   Allergies  Allergen Reactions  . Doxepin Other (See Comments)  . Gabapentin     Sleep walking  . Mirapex [Pramipexole Dihydrochloride]     Nausea and vomiting  . Phentermine     Stomach ache/increased anxiety.   . Pregabalin      Review of Systems: CONSTITUTIONAL: Neg fever,Reports increased fatigue as noted in history of present illness  HEAD/EYES/EARS/NOSE/THROAT: No headache/vision change or hearing change, no sore throat CARDIAC: No chest pain/pressure/palpitations, no orthopnea RESPIRATORY: No cough/shortness of breath/wheeze GASTROINTESTINAL:  MUSCULOSKELETAL: No myalgia/arthralgia GENITOURINARY: No incontinence, No abnormal genital bleeding/discharge SKIN: No rash/wounds/concerning lesions HEM/ONC: No easy bruising/bleeding, no abnormal lymph node ENDOCRINE: No polyuria/polydipsia/polyphagia, no heat intolerance (+) cold intolerance, known thryoid disease NEUROLOGIC: No weakness/dizzines/slurred speech PSYCHIATRIC: No concerns with depression/anxiety or sleep problems    Exam:  BP 138/74 mmHg  Pulse 75  Temp(Src) 98.3 F (36.8 C) (Oral)  Wt 228 lb (103.42 kg)  SpO2 93% Constitutional: VSS, see above. General Appearance:  alert, well-developed, well-nourished, NAD Eyes: Normal lids and conjunctive, non-icteric sclera, PERRLA Ears, Nose, Mouth, Throat: Normal external inspection ears/nares/mouth/lips/gums, Normal TM bilaterally, MMM, posterior pharynx without erythema/exudate Neck: No masses, trachea midline. No thyroid enlargement/tenderness/mass appreciated Respiratory: Normal respiratory effort. No dullness/hyper-resonance to percussion. Breath sounds normal, no wheeze/rhonchi/rales Cardiovascular: S1/S2 normal, no murmur/rub/gallop auscultated. No carotid bruit or JVD. No abdominal aortic bruit. Pedal pulse II/IV bilaterally DP and PT. No lower extremity edema. Gastrointestinal: Nontender, no masses. No hepatomegaly, no splenomegaly. No hernia appreciated. Rectal exam deferred.  Musculoskeletal: Gait normal. No clubbing/cyanosis of digits.  Neurological: No cranial nerve deficit on limited exam. Motor and sensation intact and symmetric Psychiatric: Normal judgment/insight. Depressed mood and affect. Oriented x3.    No results found for this or any previous visit (from the past 72 hour(s)).    ASSESSMENT/PLAN:   Other fatigue - Plan: COMPLETE METABOLIC PANEL WITH GFR, CBC with Differential  Polypharmacy - see below, high risk  Depression - Plan: DULoxetine (  CYMBALTA) 60 MG capsule  Hypothyroidism, unspecified hypothyroidism type - Plan: levothyroxine (SYNTHROID, LEVOTHROID) 137 MCG tablet  Neuropathy - Plan: gabapentin (NEURONTIN) 300 MG capsule, TSH  Fibromyalgia  Chronic pain syndrome - I deferred refilling Tramadol due to risk of serotonin syndrome in combination with Duloxetine, polypharmacy is a serious concern in this patient.  - Plan: traMADol (ULTRAM) 50 MG tablet  Migraine without aura and with status migrainosus, not intractable  Muscle spasm - Plan: baclofen (LIORESAL) 10 MG tablet   Discussion with patient regarding medication safety. Advised that she do her best to continue to  follow with one provider to minimize risk of error advised that she is on multiple medications which may be contributing to her symptoms as noted in history of present illness. Advised her that I would not be continuing tramadol, see above. Patient seemed upset about this but is amenable to the plan, she initially was resistant to getting blood work done today. I advised that she needed to have this as soon as possible so we could better evaluate her symptoms. She is to follow-up in 4 weeks with her primary care provider, return to clinic sooner if any other concerns, contact us with any questions. When visit summary was handed to patient I encouraged her to review the list of medications that we have here and update any changes in medication not listed in her chart, medications that have been changed or discontinued or anything she is getting from other providers. It is advised that she make every effort to discuss refills of chronic medications at scheduled routine appointments rather than acute visits, again this was emphasized as a safety issue.

## 2015-06-30 ENCOUNTER — Other Ambulatory Visit: Payer: Self-pay | Admitting: Physician Assistant

## 2015-06-30 ENCOUNTER — Ambulatory Visit (INDEPENDENT_AMBULATORY_CARE_PROVIDER_SITE_OTHER): Payer: Medicare Other | Admitting: Physician Assistant

## 2015-06-30 ENCOUNTER — Encounter: Payer: Self-pay | Admitting: Physician Assistant

## 2015-06-30 VITALS — BP 140/58 | HR 76 | Ht 70.0 in

## 2015-06-30 DIAGNOSIS — G43111 Migraine with aura, intractable, with status migrainosus: Secondary | ICD-10-CM

## 2015-06-30 DIAGNOSIS — Z79899 Other long term (current) drug therapy: Secondary | ICD-10-CM | POA: Diagnosis not present

## 2015-06-30 DIAGNOSIS — Z23 Encounter for immunization: Secondary | ICD-10-CM | POA: Diagnosis not present

## 2015-06-30 DIAGNOSIS — E039 Hypothyroidism, unspecified: Secondary | ICD-10-CM

## 2015-06-30 DIAGNOSIS — E559 Vitamin D deficiency, unspecified: Secondary | ICD-10-CM | POA: Diagnosis not present

## 2015-06-30 DIAGNOSIS — M1712 Unilateral primary osteoarthritis, left knee: Secondary | ICD-10-CM | POA: Diagnosis not present

## 2015-06-30 LAB — COMPLETE METABOLIC PANEL WITH GFR
ALT: 15 U/L (ref 6–29)
AST: 14 U/L (ref 10–35)
Albumin: 4.2 g/dL (ref 3.6–5.1)
Alkaline Phosphatase: 71 U/L (ref 33–130)
BUN: 18 mg/dL (ref 7–25)
CO2: 23 mmol/L (ref 20–31)
Calcium: 9.1 mg/dL (ref 8.6–10.4)
Chloride: 107 mmol/L (ref 98–110)
Creat: 0.6 mg/dL (ref 0.50–1.05)
Glucose, Bld: 95 mg/dL (ref 65–99)
Potassium: 4.5 mmol/L (ref 3.5–5.3)
Sodium: 140 mmol/L (ref 135–146)
TOTAL PROTEIN: 6.3 g/dL (ref 6.1–8.1)
Total Bilirubin: 0.4 mg/dL (ref 0.2–1.2)

## 2015-06-30 LAB — TSH: TSH: 4.016 u[IU]/mL (ref 0.350–4.500)

## 2015-06-30 MED ORDER — KETOROLAC TROMETHAMINE 60 MG/2ML IM SOLN
60.0000 mg | Freq: Once | INTRAMUSCULAR | Status: AC
  Administered 2015-06-30: 60 mg via INTRAMUSCULAR

## 2015-06-30 MED ORDER — TOPIRAMATE 100 MG PO TABS: 100.0000 mg | ORAL_TABLET | Freq: Two times a day (BID) | ORAL | Status: DC

## 2015-06-30 MED ORDER — MELOXICAM 15 MG PO TABS: 15.0000 mg | ORAL_TABLET | Freq: Every day | ORAL | Status: DC

## 2015-06-30 NOTE — Patient Instructions (Addendum)
Increased topamax to 100mg  twice a day.  Increased neurontin to 600mg  twice a day.

## 2015-06-30 NOTE — Progress Notes (Signed)
   Subjective:    Patient ID: Aimee Little, female    DOB: 29-Mar-1961, 54 y.o.   MRN: [id number]  HPI patient is a 54 year old female who presents to the clinic with ongoing migraine. Patient has a long history of migraines. This seems to be the same distribution and intensity. She has had this for going on 3 days. She currently sees a neurologist. She is on her first injection of Botox was with her next scheduled injection in one month. She did get some relief after the initial injection. She is currently on Topamax and Neurontin. She does seem like these help her migraines. She was receiving Toradol tablets for headache management. Her insurance stopped paying for them. She tried Fioricet and it does not seem to be helping for acute migraines.  Her restless leg symptoms have been a little worse at night. Neurontin has helped in the past.  Hypothyroidism-she has not had her labs checked in a while. She is taking her levothyroxin as indicated.  Osteoarthritis of her knees. Dr. Darene Lamer have put her on vimovo. She never got this medication. She prefers to be on Mobic and Exelon separately. I will refill these medicines today.  She is currently on weekly 50,000 units of vitamin D. She would like her vitamin D checked see if she can make any changes.  Review of Systems  All other systems reviewed and are negative.      Objective:   Physical Exam  Constitutional: She is oriented to person, place, and time. She appears well-developed and well-nourished.  HENT:  Head: Normocephalic and atraumatic.  Cardiovascular: Normal rate, regular rhythm and normal heart sounds.   Pulmonary/Chest: Effort normal and breath sounds normal. She has no wheezes.  Neurological: She is alert and oriented to person, place, and time.  Psychiatric: She has a normal mood and affect. Her behavior is normal.          Assessment & Plan:  Migraine- Toradol 60mg  given in office today. Will continue to follow up with  neurologist. Elizebeth Koller getting botox series. Increased topamax to 100mg  bid and see if helps prevent migraine.   RLS- increased neurontin to see if helps with night symptoms.   Hypothyroidism- will recheck levels and adjust accordingly.   Primary osteoarthritis- pt on mobic and dexilant. She did not want to continue on vimovo due to cost and effectivness. mobic works better per pt. Warned of signs and symptoms of GI irritation.   Vitamin D def- will check today and see if any adjustments need to be made.

## 2015-07-01 LAB — VITAMIN D 25 HYDROXY (VIT D DEFICIENCY, FRACTURES): VIT D 25 HYDROXY: 27 ng/mL — AB (ref 30–100)

## 2015-07-03 ENCOUNTER — Other Ambulatory Visit: Payer: Self-pay | Admitting: Physician Assistant

## 2015-07-03 ENCOUNTER — Encounter: Payer: Self-pay | Admitting: Physician Assistant

## 2015-07-03 ENCOUNTER — Other Ambulatory Visit: Payer: Self-pay

## 2015-07-03 MED ORDER — VITAMIN D (ERGOCALCIFEROL) 1.25 MG (50000 UNIT) PO CAPS: 50000.0000 [IU] | ORAL_CAPSULE | ORAL | Status: DC

## 2015-07-03 MED ORDER — VITAMIN D (ERGOCALCIFEROL) 1.25 MG (50000 UNIT) PO CAPS
50000.0000 [IU] | ORAL_CAPSULE | ORAL | Status: DC
Start: 2015-07-03 — End: 2015-07-03

## 2015-07-07 ENCOUNTER — Encounter: Payer: Self-pay | Admitting: Family Medicine

## 2015-07-07 ENCOUNTER — Ambulatory Visit (INDEPENDENT_AMBULATORY_CARE_PROVIDER_SITE_OTHER): Payer: Medicare Other | Admitting: Family Medicine

## 2015-07-07 VITALS — BP 136/84 | HR 81 | Temp 98.2°F | Resp 16

## 2015-07-07 DIAGNOSIS — G43111 Migraine with aura, intractable, with status migrainosus: Secondary | ICD-10-CM

## 2015-07-07 DIAGNOSIS — G629 Polyneuropathy, unspecified: Secondary | ICD-10-CM | POA: Diagnosis not present

## 2015-07-07 MED ORDER — METOCLOPRAMIDE HCL 10 MG PO TABS: 5.0000 mg | ORAL_TABLET | Freq: Three times a day (TID) | ORAL | Status: DC | PRN

## 2015-07-07 MED ORDER — KETOROLAC TROMETHAMINE 60 MG/2ML IM SOLN
60.0000 mg | Freq: Once | INTRAMUSCULAR | Status: AC
  Administered 2015-07-07: 60 mg via INTRAMUSCULAR

## 2015-07-07 MED ORDER — METHYLPREDNISOLONE ACETATE 80 MG/ML IJ SUSP
80.0000 mg | Freq: Once | INTRAMUSCULAR | Status: AC
  Administered 2015-07-07: 80 mg via INTRAMUSCULAR

## 2015-07-07 MED ORDER — GABAPENTIN 300 MG PO CAPS: 600.0000 mg | ORAL_CAPSULE | Freq: Three times a day (TID) | ORAL | Status: DC | PRN

## 2015-07-07 NOTE — Patient Instructions (Signed)
Thank you for coming in today. Follow up with Luvenia Starch next week.,  Increase Gabapentin to 2 tablets 3x daily.  Take reglan as needed.  Go to the emergency room if your headache becomes excruciating or you have weakness or numbness or uncontrolled vomiting.    Migraine Headache A migraine headache is an intense, throbbing pain on one or both sides of your head. A migraine can last for 30 minutes to several hours. CAUSES  The exact cause of a migraine headache is not always known. However, a migraine may be caused when nerves in the brain become irritated and release chemicals that cause inflammation. This causes pain. Certain things may also trigger migraines, such as:  Alcohol.  Smoking.  Stress.  Menstruation.  Aged cheeses.  Foods or drinks that contain nitrates, glutamate, aspartame, or tyramine.  Lack of sleep.  Chocolate.  Caffeine.  Hunger.  Physical exertion.  Fatigue.  Medicines used to treat chest pain (nitroglycerine), birth control pills, estrogen, and some blood pressure medicines. SIGNS AND SYMPTOMS  Pain on one or both sides of your head.  Pulsating or throbbing pain.  Severe pain that prevents daily activities.  Pain that is aggravated by any physical activity.  Nausea, vomiting, or both.  Dizziness.  Pain with exposure to bright lights, loud noises, or activity.  General sensitivity to bright lights, loud noises, or smells. Before you get a migraine, you may get warning signs that a migraine is coming (aura). An aura may include:  Seeing flashing lights.  Seeing bright spots, halos, or zigzag lines.  Having tunnel vision or blurred vision.  Having feelings of numbness or tingling.  Having trouble talking.  Having muscle weakness. DIAGNOSIS  A migraine headache is often diagnosed based on:  Symptoms.  Physical exam.  A CT scan or MRI of your head. These imaging tests cannot diagnose migraines, but they can help rule out other  causes of headaches. TREATMENT Medicines may be given for pain and nausea. Medicines can also be given to help prevent recurrent migraines.  HOME CARE INSTRUCTIONS  Only take over-the-counter or prescription medicines for pain or discomfort as directed by your health care provider. The use of long-term narcotics is not recommended.  Lie down in a dark, quiet room when you have a migraine.  Keep a journal to find out what may trigger your migraine headaches. For example, write down:  What you eat and drink.  How much sleep you get.  Any change to your diet or medicines.  Limit alcohol consumption.  Quit smoking if you smoke.  Get 7-9 hours of sleep, or as recommended by your health care provider.  Limit stress.  Keep lights dim if bright lights bother you and make your migraines worse. SEEK IMMEDIATE MEDICAL CARE IF:   Your migraine becomes severe.  You have a fever.  You have a stiff neck.  You have vision loss.  You have muscular weakness or loss of muscle control.  You start losing your balance or have trouble walking.  You feel faint or pass out.  You have severe symptoms that are different from your first symptoms. MAKE SURE YOU:   Understand these instructions.  Will watch your condition.  Will get help right away if you are not doing well or get worse.   This information is not intended to replace advice given to you by your health care provider. Make sure you discuss any questions you have with your health care provider.   Document Released: 09/16/2005  Document Revised: 10/07/2014 Document Reviewed: 05/24/2013 Elsevier Interactive Patient Education [employer].

## 2015-07-07 NOTE — Progress Notes (Signed)
Aimee Little is a 54 y.o. female who presents to [facility]  today for headache. Aimee Little has a history of migraines and fibromyalgia and she believes she is currently having both a fibromyalgia flare up as well as a migraine. She received botox injections for migraines in August which helped until about 2 weeks ago when she began to have some migraines again. She states that this particular headache has lasted for about 4 days and is getting worse each day. She has tried taking her maxalt and topamax which have given her little relief. In addition to a severe headache she describes sensitivity to light and sound, eye pain, neck pain, back pain, hand pain and nausea. She denies any fever, recent illness, chest pain, shortness of breath, cough, palpitations, sinus pain, vomiting, abdominal pain, diarrhea or constipation.    Past Medical History  Diagnosis Date  . Thyroid disease   . Depression   . Insomnia   . Fibromyalgia   . DDD (degenerative disc disease), lumbar    Past Surgical History  Procedure Laterality Date  . Abdominal hysterectomy      took uterus and both ovaries left   Social History  Substance Use Topics  . Smoking status: Former Smoker -- 0.50 packs/day    Quit date: 10/02/2013  . Smokeless tobacco: Not on file  . Alcohol Use: Not on file   family history includes Alcohol abuse in her brother; Depression in her mother; Diabetes in her sister; Heart attack in her father; Hyperlipidemia in her mother; Hypertension in her brother, mother, and sister.  ROS as above Medications: Current Outpatient Prescriptions  Medication Sig Dispense Refill  . AMBULATORY NON FORMULARY MEDICATION Compression stockings, please fit to size, pressure 20-97mmHg one pair one refill. Dx Venous Insufficiency 1 Units 1  . baclofen (LIORESAL) 10 MG tablet TAKE ONE TABLET BY MOUTH 3 TIMES A DAY 30 tablet 3  . botulinum toxin Type A (BOTOX) 100 UNITS SOLR  injection Inject 100 Units into the muscle.    Marland Kitchen buPROPion (WELLBUTRIN XL) 150 MG 24 hr tablet     . Butalbital-APAP-Caffeine (FIORICET) 50-300-40 MG CAPS Take 1 tablet by mouth 2 (two) times daily as needed. 60 capsule 0  . Cyanocobalamin (VITAMIN B 12) 100 MCG LOZG Take 1 tablet by mouth.    . DEXILANT 60 MG capsule TAKE 1 CAPSULE BY MOUTH ONCE DAILY 30 capsule 2  . DULoxetine (CYMBALTA) 60 MG capsule Take 1 capsule (60 mg total) by mouth daily. 60 capsule 3  . fluticasone (FLONASE) 50 MCG/ACT nasal spray USE 1 TO 2 SPRAYS IN EACH NOSTRIL ONCE DAILY. 16 g 6  . furosemide (LASIX) 20 MG tablet One to two tablets at noon daily as needed only for fluid in legs. (Patient not taking: Reported on 06/30/2015) 60 tablet 1  . gabapentin (NEURONTIN) 300 MG capsule Take 2 capsules (600 mg total) by mouth 3 (three) times daily as needed (pain/neuropathy). 180 capsule 1  . ipratropium (ATROVENT) 0.06 % nasal spray Place 2 sprays into both nostrils 4 (four) times daily. 15 mL 1  . levothyroxine (SYNTHROID, LEVOTHROID) 137 MCG tablet Take 1 tablet (137 mcg total) by mouth daily before breakfast. 30 tablet 3  . meloxicam (MOBIC) 15 MG tablet Take 1 tablet (15 mg total) by mouth daily. 30 tablet 2  . metoCLOPramide (REGLAN) 10 MG tablet Take 0.5-1 tablets (5-10 mg total) by mouth every 8 (eight) hours as needed for nausea (nausea/headache). 90 tablet  0  . ondansetron (ZOFRAN) 4 MG tablet Take 1 tablet (4 mg total) by mouth every 8 (eight) hours as needed. 30 tablet 4  . ranitidine (ZANTAC) 300 MG tablet Take 1 tablet (300 mg total) by mouth 2 (two) times daily. 180 tablet 3  . rizatriptan (MAXALT-MLT) 10 MG disintegrating tablet TAKE ONE TABLET BY MOUTH AS NEEDED. MAY REPEAT IN 2 HOURS IF NEEDED. 10 tablet 5  . topiramate (TOPAMAX) 100 MG tablet Take 1 tablet (100 mg total) by mouth 2 (two) times daily. 60 tablet 2  . traMADol (ULTRAM) 50 MG tablet TAKE ONE TABLET EVERY 4 TO 6 HOURS AS NEEDED FOR PAIN 60 tablet 0   . Vitamin D, Ergocalciferol, (DRISDOL) 50000 UNITS CAPS capsule Take 1 capsule (50,000 Units total) by mouth every 7 (seven) days. 12 capsule 0  . zolpidem (AMBIEN) 5 MG tablet Take 1 tablet (5 mg total) by mouth at bedtime as needed for sleep. 30 tablet 0   No current facility-administered medications for this visit.   Allergies  Allergen Reactions  . Doxepin Other (See Comments)  . Mirapex [Pramipexole Dihydrochloride]     Nausea and vomiting  . Phentermine     Stomach ache/increased anxiety.   . Pregabalin   . Gabapentin     Sleep walking     Exam:  BP 136/84 mmHg  Pulse 81  Temp(Src) 98.2 F (36.8 C) (Oral)  Resp 16  SpO2 98% Gen: WDWN no acute distress. Nontoxic appearing  female who appears in pain and is holding her head in hands during the exam. HEENT: EOMI,  MMM. PERRLA Lungs: Normal work of breathing. CTABL Heart: RRR no MRG Abd: NABS, Soft. Nondistended, Nontender Exts: Brisk capillary refill, warm and well perfused.  Neuro: Alert and oriented normal coordination normal motion and strength. Neck: No meningismus normal neck range of motion.  Assessment: 1. Migraine based on patient history of migraines, patient description of symptoms and patient presentation on physical exam.  Plan: 1. Patient received IM injections of 60mg  Toradol and 80 mg depomedrol in the office today. Patient received prescription for Metoclopramide 10 mg PO PRN for nausea and headache. Patient instructed to return to clinic in 2 weeks for f/u with Iran Planas, PA-C. Patient instructed to return to clinic or ED if symptoms worsen, she has trouble breathing, chest pain or headache becomes increasingly worse.

## 2015-07-13 ENCOUNTER — Telehealth: Payer: Self-pay | Admitting: *Deleted

## 2015-07-13 NOTE — Telephone Encounter (Signed)
Pt called and states she feels she has a sinus infection. She has dried blood in her nose and she is sneezing. She states in the message that her nasal spray is "not working." Since it was noted on her recent visit that she denied sinus pain or sxs and dried blood in the nose and sneezing does not necessarily mean that you have an infection called pt to get more clarification or see if pt needs to take allergy meds.Called pt and got vm. Advised pt to schedule appt since pt requesting abx and don't rx  abx over the phone

## 2015-07-21 DIAGNOSIS — G43119 Migraine with aura, intractable, without status migrainosus: Secondary | ICD-10-CM | POA: Diagnosis not present

## 2015-08-22 ENCOUNTER — Ambulatory Visit: Payer: Medicare Other | Admitting: Physician Assistant

## 2015-09-04 ENCOUNTER — Other Ambulatory Visit: Payer: Self-pay | Admitting: Physician Assistant

## 2015-09-06 ENCOUNTER — Ambulatory Visit (INDEPENDENT_AMBULATORY_CARE_PROVIDER_SITE_OTHER): Payer: Medicare Other | Admitting: Physician Assistant

## 2015-09-06 ENCOUNTER — Ambulatory Visit (INDEPENDENT_AMBULATORY_CARE_PROVIDER_SITE_OTHER): Payer: Medicare Other

## 2015-09-06 ENCOUNTER — Encounter: Payer: Self-pay | Admitting: Physician Assistant

## 2015-09-06 VITALS — BP 141/58 | HR 111 | Ht 70.0 in

## 2015-09-06 DIAGNOSIS — M47816 Spondylosis without myelopathy or radiculopathy, lumbar region: Secondary | ICD-10-CM | POA: Diagnosis not present

## 2015-09-06 DIAGNOSIS — M545 Low back pain, unspecified: Secondary | ICD-10-CM

## 2015-09-06 DIAGNOSIS — M5136 Other intervertebral disc degeneration, lumbar region: Secondary | ICD-10-CM | POA: Diagnosis not present

## 2015-09-06 DIAGNOSIS — M533 Sacrococcygeal disorders, not elsewhere classified: Secondary | ICD-10-CM | POA: Diagnosis not present

## 2015-09-06 MED ORDER — HYDROCODONE-ACETAMINOPHEN 5-325 MG PO TABS: 1.0000 | ORAL_TABLET | Freq: Four times a day (QID) | ORAL | Status: DC | PRN

## 2015-09-06 MED ORDER — PREDNISONE 50 MG PO TABS: ORAL_TABLET | ORAL | Status: DC

## 2015-09-06 MED ORDER — KETOROLAC TROMETHAMINE 30 MG/ML IJ SOLN
30.0000 mg | Freq: Once | INTRAMUSCULAR | Status: AC
  Administered 2015-09-06: 30 mg via INTRAMUSCULAR

## 2015-09-06 NOTE — Patient Instructions (Addendum)
Will get PT.  Get xray today.

## 2015-09-06 NOTE — Progress Notes (Signed)
   Subjective:    Patient ID: Aimee Little, female    DOB: [birth year], 54 y.o.   MRN: [id number]  HPI  Pt is a 54 yo female who presents to the clinic with bilateral low back pain that is episodic. She has had back pain for years. 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 she has also been on her feet shopping more in the last week. For the last day pain has been worse. No radiation into legs. Pt Rates pain 10/10. She has tried tramadol, mobic, and baclofen with no relief.    Review of Systems  All other systems reviewed and are negative.      Objective:   Physical Exam  Constitutional: She is oriented to person, place, and time. She appears well-developed and well-nourished.  HENT:  Head: Normocephalic and atraumatic.  Cardiovascular: Regular rhythm and normal heart sounds.   Tachycardia at 111.   Pulmonary/Chest: Effort normal and breath sounds normal.  Musculoskeletal:  Pain with forward flexion at waist.  Pain with extension at waist.  Tenderness to palpation over lumbar spine that is worse over bilateral SI joint.  Tightness of lumbar Paraspinous muscles.  Negative straight leg bilaterally.  Patellar reflexes 2+symmetric.    Neurological: She is alert and oriented to person, place, and time.  Psychiatric: She has a normal mood and affect. Her behavior is normal.          Assessment & Plan:  Bilateral low back pain without sciatic/SI joint dysfunction- Exercises given today. Xray ordered. Prednisone burst. Toradol 30mg  IM office today. Small quantity of norco for acute pain. Discussed to only use as needed. Warm compresses. Continue muscle relaxer as needed.  Will get PT ordered.  Discussed proper lifting techniques.

## 2015-09-13 ENCOUNTER — Ambulatory Visit (INDEPENDENT_AMBULATORY_CARE_PROVIDER_SITE_OTHER): Payer: Medicare Other | Admitting: Physician Assistant

## 2015-09-13 ENCOUNTER — Encounter: Payer: Self-pay | Admitting: Physician Assistant

## 2015-09-13 VITALS — BP 148/66 | HR 67 | Temp 98.1°F | Ht 70.0 in

## 2015-09-13 DIAGNOSIS — J0100 Acute maxillary sinusitis, unspecified: Secondary | ICD-10-CM | POA: Diagnosis not present

## 2015-09-13 MED ORDER — HYDROCOD POLST-CPM POLST ER 10-8 MG/5ML PO SUER: 5.0000 mL | Freq: Two times a day (BID) | ORAL | Status: DC | PRN

## 2015-09-13 MED ORDER — AMOXICILLIN-POT CLAVULANATE 875-125 MG PO TABS: 1.0000 | ORAL_TABLET | Freq: Two times a day (BID) | ORAL | Status: DC

## 2015-09-13 MED ORDER — BECLOMETHASONE DIPROPIONATE 80 MCG/ACT NA AERS: INHALATION_SPRAY | NASAL | Status: DC

## 2015-09-13 NOTE — Progress Notes (Signed)
   Subjective:    Patient ID: Aimee Little, female    DOB: March 01, 1961, 54 y.o.   MRN: [id number]  HPI  Pt presents to the clinic with 5 days of sinus pressure, teeth pain, cough, congestion, ear pressure. 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 She has been sick. A lot of times qnasl helps prevent sinuses from getting infected. She has ran out. She now has a lot of pressure and a daily HA. Describes symptoms as moderate. No fever, chills, body aches, n/v/d.     Review of Systems  All other systems reviewed and are negative.      Objective:   Physical Exam  Constitutional: She is oriented to person, place, and time. She appears well-developed and well-nourished.  HENT:  Head: Normocephalic and atraumatic.  Right Ear: External ear normal.  Left Ear: External ear normal.  TM's dull amber bilaterally. No blood or pus.   Tenderness over maxillary sinuses to palpation.   Oropharynx erythematous with some PND.   Pink nasal turbinates bilaterally with scant discharge.   Eyes: Conjunctivae are normal.  Neck: Normal range of motion. Neck supple.  Cardiovascular: Normal rate, regular rhythm and normal heart sounds.   Pulmonary/Chest: Effort normal and breath sounds normal.  Lymphadenopathy:    She has no cervical adenopathy.  Neurological: She is alert and oriented to person, place, and time.  Skin: Skin is dry.  Psychiatric: She has a normal mood and affect. Her behavior is normal.          Assessment & Plan:  Subacute sinusitis maxillary- sent qnasl to use as needed. Treated with augmentin. Discussed mucinex, zinc, vitamin C at start of symptoms.

## 2015-09-15 ENCOUNTER — Encounter: Payer: Self-pay | Admitting: Physician Assistant

## 2015-09-22 ENCOUNTER — Encounter: Payer: Self-pay | Admitting: Physician Assistant

## 2015-09-22 ENCOUNTER — Ambulatory Visit (INDEPENDENT_AMBULATORY_CARE_PROVIDER_SITE_OTHER): Payer: Medicare Other | Admitting: Physician Assistant

## 2015-09-22 VITALS — BP 127/64 | HR 84 | Ht 70.0 in

## 2015-09-22 DIAGNOSIS — IMO0002 Reserved for concepts with insufficient information to code with codable children: Secondary | ICD-10-CM

## 2015-09-22 DIAGNOSIS — M1710 Unilateral primary osteoarthritis, unspecified knee: Secondary | ICD-10-CM

## 2015-09-22 DIAGNOSIS — M179 Osteoarthritis of knee, unspecified: Secondary | ICD-10-CM | POA: Diagnosis not present

## 2015-09-22 DIAGNOSIS — G2581 Restless legs syndrome: Secondary | ICD-10-CM | POA: Diagnosis not present

## 2015-09-22 DIAGNOSIS — M25562 Pain in left knee: Secondary | ICD-10-CM | POA: Diagnosis not present

## 2015-09-22 MED ORDER — ROPINIROLE HCL 0.25 MG PO TABS: ORAL_TABLET | ORAL | Status: DC

## 2015-09-22 NOTE — Progress Notes (Signed)
   Subjective:    Patient ID: Aimee Little, female    DOB: 1961/02/20, 54 y.o.   MRN: [id number]  HPI Patient is a 54 year old female who presents to the clinic with left knee pain. She has a history of left knee pain and osteoarthritis of left knee. She requests a injection today. She's been doing a lot of shopping due to Christmas. She has a lot of kicking to do this weekend and would like an injection. She is taking anti-inflammatories with some relief.  Patient continues to have restless leg symptoms. There are bilateral legs. They. Jumpy and is hard for her to go to sleep. Mirapex helped significantly but it caused her to be very nauseated. She is on gabapentin. She would like to another is anything else for her restless legs.   Review of Systems  All other systems reviewed and are negative.      Objective:   Physical Exam  Constitutional: She is oriented to person, place, and time. She appears well-developed and well-nourished.  HENT:  Head: Normocephalic and atraumatic.  Cardiovascular: Normal rate, regular rhythm and normal heart sounds.   Musculoskeletal:  Some mild pinpoint tenderness over lateral joint space of left knee.  Normal range of motion of left knee.  Bilateral normal 5 out of 5 strength.  Neurological: She is alert and oriented to person, place, and time.  Psychiatric: She has a normal mood and affect. Her behavior is normal.          Assessment & Plan:  Left knee pain/OA of left knee- continue NSAIDs as needed. Injection given today. Follow up with ortho for viscous supplementation. Consider tumeric for body inflammation.   Knee Injection  Pre-operative Diagnosis: left knee osteoarthritis pain  Post-operative Diagnosis: same  Indications: Symptom relief from osteoarthritis  Anesthesia: ethytl cholride without added sodium bicarbonate  Procedure Details   Verbal consent was obtained for the procedure. The joint was prepped with Betadine and a  small wheel of anesthetic was injected into the subcutaneous tissue. A 22 gauge needle was inserted into the superior aspect of the joint from a lateral approach.  9 ml 1% lidocaine and 1 ml of depo medrol  40mg  was then injected into the joint through the same needle. The needle was removed and the area cleansed and dressed.  Complications:  None; patient tolerated the procedure well.  RLS- start requip increase by 1 tablet every 4 days stop at 4  Tablets before bed. Follow up as needed or in 2 months.

## 2015-09-29 ENCOUNTER — Other Ambulatory Visit: Payer: Self-pay | Admitting: Physician Assistant

## 2015-09-29 ENCOUNTER — Other Ambulatory Visit: Payer: Self-pay | Admitting: Osteopathic Medicine

## 2015-10-04 ENCOUNTER — Telehealth: Payer: Self-pay

## 2015-10-04 MED ORDER — LIDOCAINE 5 % EX PTCH: 1.0000 | MEDICATED_PATCH | CUTANEOUS | Status: DC

## 2015-10-04 NOTE — Telephone Encounter (Signed)
Prescription sent to [facility].

## 2015-10-04 NOTE — Telephone Encounter (Signed)
Left message advising patient

## 2015-10-04 NOTE — Telephone Encounter (Signed)
Of for patches #30 RF#1.

## 2015-10-04 NOTE — Telephone Encounter (Signed)
Patient called and states she would like a refill on Lidoderm patches for her back and knees. She states she was on this in the past. Not on current medication list. Please advise.

## 2015-10-19 ENCOUNTER — Telehealth: Payer: Self-pay

## 2015-10-19 NOTE — Telephone Encounter (Signed)
PA for lidocaine patch sent through covermymeds.

## 2015-10-20 ENCOUNTER — Other Ambulatory Visit: Payer: Self-pay | Admitting: Family Medicine

## 2015-10-20 NOTE — Telephone Encounter (Signed)
Lidocaine was denied. Gave fax copy to [facility].

## 2015-10-25 ENCOUNTER — Encounter: Payer: Self-pay | Admitting: Physician Assistant

## 2015-10-25 ENCOUNTER — Ambulatory Visit (INDEPENDENT_AMBULATORY_CARE_PROVIDER_SITE_OTHER): Payer: Medicare Other | Admitting: Physician Assistant

## 2015-10-25 VITALS — BP 143/75 | HR 85 | Ht 70.0 in

## 2015-10-25 DIAGNOSIS — F32A Depression, unspecified: Secondary | ICD-10-CM

## 2015-10-25 DIAGNOSIS — M62838 Other muscle spasm: Secondary | ICD-10-CM

## 2015-10-25 DIAGNOSIS — K219 Gastro-esophageal reflux disease without esophagitis: Secondary | ICD-10-CM

## 2015-10-25 DIAGNOSIS — G43001 Migraine without aura, not intractable, with status migrainosus: Secondary | ICD-10-CM

## 2015-10-25 DIAGNOSIS — R1115 Cyclical vomiting syndrome unrelated to migraine: Secondary | ICD-10-CM

## 2015-10-25 DIAGNOSIS — M797 Fibromyalgia: Secondary | ICD-10-CM | POA: Diagnosis not present

## 2015-10-25 DIAGNOSIS — G43A0 Cyclical vomiting, not intractable: Secondary | ICD-10-CM

## 2015-10-25 DIAGNOSIS — G894 Chronic pain syndrome: Secondary | ICD-10-CM | POA: Diagnosis not present

## 2015-10-25 DIAGNOSIS — F3341 Major depressive disorder, recurrent, in partial remission: Secondary | ICD-10-CM

## 2015-10-25 DIAGNOSIS — E039 Hypothyroidism, unspecified: Secondary | ICD-10-CM

## 2015-10-25 DIAGNOSIS — G2581 Restless legs syndrome: Secondary | ICD-10-CM

## 2015-10-25 DIAGNOSIS — F329 Major depressive disorder, single episode, unspecified: Secondary | ICD-10-CM | POA: Diagnosis not present

## 2015-10-25 MED ORDER — LEVOTHYROXINE SODIUM 150 MCG PO TABS: 150.0000 ug | ORAL_TABLET | Freq: Every day | ORAL | Status: DC

## 2015-10-25 MED ORDER — VITAMIN D (ERGOCALCIFEROL) 1.25 MG (50000 UNIT) PO CAPS: 50000.0000 [IU] | ORAL_CAPSULE | ORAL | Status: DC

## 2015-10-25 MED ORDER — BACLOFEN 10 MG PO TABS: ORAL_TABLET | ORAL | Status: DC

## 2015-10-25 MED ORDER — METOCLOPRAMIDE HCL 10 MG PO TABS: 5.0000 mg | ORAL_TABLET | Freq: Three times a day (TID) | ORAL | Status: DC | PRN

## 2015-10-25 MED ORDER — TRAMADOL HCL 50 MG PO TABS: ORAL_TABLET | ORAL | Status: DC

## 2015-10-25 MED ORDER — TOPIRAMATE 100 MG PO TABS: 100.0000 mg | ORAL_TABLET | Freq: Two times a day (BID) | ORAL | Status: DC

## 2015-10-27 DIAGNOSIS — E039 Hypothyroidism, unspecified: Secondary | ICD-10-CM | POA: Insufficient documentation

## 2015-10-27 NOTE — Progress Notes (Signed)
   Subjective:    Patient ID: Aimee Little, female    DOB: [birth year], 55 y.o.   MRN: [id number]  HPI Patient is a 55 year old female who presents to the clinic for medication refills. She request that we refill extended periods so she does not have to come in so frequently.  Depression-patient is on Cymbalta and doing well. She has no concerns or complaints. She denies any suicidal or homicidal thoughts.  Fibromyalgia patient is controlled on gabapentin and baclofen. She also takes Cymbalta for depression was helps with her fibroid symptoms. She continues to stay active. She occasionally does have some muscle spasms which she also uses the baclofen 4.  She does have some chronic pain that she uses occasional tramadol. She would like refill on this.  Hypothyroidism-patient takes levothyroxin. Her last levels were 3 months ago.  GERD-patient takes Dexon and Zantac. Currently her symptoms are controlled.  Nausea and vomiting-this is mostly related to medication. She does take Reglan most days. Symptoms controlled today.  Restless leg syndrome she is taking Requip. Her symptoms have improved significantly.  Migraines-currently patient is doing well. She is getting Botox injections from another provider along with Maxalt as needed and Topamax as a preventative.   Review of Systems  All other systems reviewed and are negative.      Objective:   Physical Exam  Constitutional: She is oriented to person, place, and time. She appears well-developed and well-nourished.  HENT:  Head: Normocephalic and atraumatic.  Cardiovascular: Normal rate, regular rhythm and normal heart sounds.   Pulmonary/Chest: Effort normal and breath sounds normal.  Neurological: She is alert and oriented to person, place, and time.  Psychiatric: She has a normal mood and affect. Her behavior is normal.          Assessment & Plan:  Depression-Cymbalta refilled for 6 months. Follow-up in 6  months.  Fibromyalgia/muscle spasm- baclofen and gabapentin refilled for 6 months. Follow-up in 6 months.  Chronic pain-tramadol was refilled for as needed usage.  Hypothyroidism-last level was checked 3 months ago and was in the normal range but in the upper limits of normal. Was slightly did increase levothyroxin to see if made her feel any better. We'll recheck in 3-6 months.  GERD-Dexon and Zantac refilled for 6 months. Follow-up in 6 months.  Nausea and vomiting-Reglan refilled for 6 months.  Restless leg syndrome-Requip refilled for 6 months.  Migraine-Maxalt and Topamax refilled for 6 months. She gets Botox injections from another provider.

## 2015-10-31 ENCOUNTER — Other Ambulatory Visit: Payer: Self-pay | Admitting: Physician Assistant

## 2015-11-01 ENCOUNTER — Other Ambulatory Visit: Payer: Self-pay | Admitting: *Deleted

## 2015-11-01 MED ORDER — VALACYCLOVIR HCL 1 G PO TABS: ORAL_TABLET | ORAL | Status: DC

## 2015-11-07 ENCOUNTER — Ambulatory Visit (INDEPENDENT_AMBULATORY_CARE_PROVIDER_SITE_OTHER): Payer: Medicare Other | Admitting: Family Medicine

## 2015-11-07 ENCOUNTER — Encounter: Payer: Self-pay | Admitting: Family Medicine

## 2015-11-07 VITALS — BP 150/67 | HR 89

## 2015-11-07 DIAGNOSIS — M25561 Pain in right knee: Secondary | ICD-10-CM | POA: Diagnosis not present

## 2015-11-07 NOTE — Progress Notes (Signed)
   Subjective:    I'm seeing this patient as a consultation for:  Iran Planas PA-C  CC: Right Knee Pain  HPI: Patient has a history of left knee pain present for almost a year. She's had injections in the past most recently in late December. Over the past 2 months or so she notes her right knee is starting to hurt. She notes popping and cracking in her knee and pain on both joint lines. The pain is worse with activity and better with rest. She notes some swelling. She's tried some meloxicam which does help. She denies any recent injuries fevers chills nausea vomiting or diarrhea.  Past medical history, Surgical history, Family history not pertinant except as noted below, Social history, Allergies, and medications have been entered into the medical record, reviewed, and no changes needed.   Review of Systems: No new headache, visual changes, new nausea, vomiting, new diarrhea, constipation, dizziness, abdominal pain, skin rash, fevers, chills, night sweats, weight loss, swollen lymph nodes, body aches, joint swelling, muscle aches, chest pain, shortness of breath, mood changes, visual or auditory hallucinations.   Objective:    Filed Vitals:   11/07/15 1105  BP: 150/67  Pulse: 89   General: Well Developed, well nourished, and in no acute distress.  Neuro/Psych: Alert and oriented x3, extra-ocular muscles intact, able to move all 4 extremities, sensation grossly intact. Skin: Warm and dry, no rashes noted.  Respiratory: Not using accessory muscles, speaking in full sentences, trachea midline.  Cardiovascular: Pulses palpable, no extremity edema. Abdomen: Does not appear distended. MSK: Right knee with mild effusion. No skin change. Range of motion 0-120 with 1+ retropatellar crepitations on extension. Tender to palpation along the medial joint line. Nontender otherwise. Negative anterior posterior drawer. Negative valgus and varus stress. Mildly positive medial McMurray's  test.  Left knee mild effusion no skin change Range of motion 0-120 with 1+ retropatellar crepitations on extension Tender palpation medial and lateral joint lines. Negative anterior posterior drawer test. Negative valgus varus stress. Mildly positive medial and lateral McMurray's test.   Procedure: Real-time Ultrasound Guided Injection of left knee  Device: GE Logiq E  Images permanently stored and available for review in the ultrasound unit. Verbal informed consent obtained. Discussed risks and benefits of procedure. Warned about infection bleeding damage to structures skin hypopigmentation and fat atrophy among others. Patient expresses understanding and agreement Time-out conducted.  Noted no overlying erythema, induration, or other signs of local infection.  Skin prepped in a sterile fashion.  Local anesthesia: Topical Ethyl chloride.  With sterile technique and under real time ultrasound guidance: 80 mg of Kenalog and 4 mL of Marcaine injected easily.  Completed without difficulty  Pain immediately resolved suggesting accurate placement of the medication.  Advised to call if fevers/chills, erythema, induration, drainage, or persistent bleeding.  Images permanently stored and available for review in the ultrasound unit.  Impression: Technically successful ultrasound guided injection.     XRAY BL knee dated 02/13/2015 reviewed.   No results found for this or any previous visit (from the past 24 hour(s)). No results found.  Impression and Recommendations:   This case required medical decision making of moderate complexity.

## 2015-11-07 NOTE — Patient Instructions (Signed)
Thank you for coming in today. Call or go to the ER if you develop a large red swollen joint with extreme pain or oozing puss.  Return in 1 month.   Meniscus Tear With Phase I Rehab The meniscus is a C-shaped cartilage structure, located in the knee joint between the thigh bone (femur) and the shinbone (tibia). Two menisci are located in each knee joint: the inner and outer meniscus. The meniscus acts as an adapter between the thigh bone and shinbone, allowing them to fit properly together. It also functions as a shock absorber, to reduce the stress placed on the knee joint and to help supply nutrients to the knee joint cartilage. As people age, the meniscus begins to harden and become more vulnerable to injury. Meniscus tears are a common injury, especially in older athletes. Inner meniscus tears are more common than outer meniscus tears.  SYMPTOMS   Pain in the knee, especially with standing or squatting with the affected leg.  Tenderness along the joint line.  Swelling in the knee joint (effusion), usually starting 1 to 2 days after injury.  Locking or catching of the knee joint, causing inability to straighten the knee completely.  Giving way or buckling of the knee. CAUSES  A meniscus tear occurs when a force is placed on the meniscus that is greater than it can handle. Common causes of injury include:  Direct hit (trauma) to the knee.  Twisting, pivoting, or cutting (rapidly changing direction while running), kneeling or squatting.  Without injury, due to aging. RISK INCREASES WITH:  Contact sports (football, rugby).  Sports in which cleats are used with pivoting (soccer, lacrosse) or sports in which good shoe grip and sudden change in direction are required (racquetball, basketball, squash).  Previous knee injury.  Associated knee injury, particularly ligament injuries.  Poor strength and flexibility. PREVENTION  Warm up and stretch properly before activity.  Maintain  physical fitness:  Strength, flexibility, and endurance.  Cardiovascular fitness.  Protect the knee with a brace or elastic bandage.  Wear properly fitted protective equipment (proper cleats for the surface). PROGNOSIS  Sometimes, meniscus tears heal on their own. However, definitive treatment requires surgery, followed by at least 6 weeks of recovery.  RELATED COMPLICATIONS   Recurring symptoms that result in a chronic problem.  Repeated knee injury, especially if sports are resumed too soon after injury or surgery.  Progression of the tear (the tear gets larger), if untreated.  Arthritis of the knee in later years (with or without surgery).  Complications of surgery, including infection, bleeding, injury to nerves (numbness, weakness, paralysis) continued pain, giving way, locking, nonhealing of meniscus (if repaired), need for further surgery, and knee stiffness (loss of motion). TREATMENT  Treatment first involves the use of ice and medicine, to reduce pain and inflammation. You may find using crutches to walk more comfortable. However, it is okay to bear weight on the injured knee, if the pain will allow it. Surgery is often advised as a definitive treatment. Surgery is performed through an incision near the joint (arthroscopically). The torn piece of the meniscus is removed, and if possible the joint cartilage is repaired. After surgery, the joint must be restrained. After restraint, it is important to perform strengthening and stretching exercises to help regain strength and a full range of motion. These exercises may be completed at home or with a therapist.  MEDICATION  If pain medicine is needed, nonsteroidal anti-inflammatory medicines (aspirin and ibuprofen), or other minor pain relievers (acetaminophen),  are often advised.  Do not take pain medicine for 7 days before surgery.  Prescription pain relievers may be given, if your caregiver thinks they are needed. Use only as  directed and only as much as you need. HEAT AND COLD  Cold treatment (icing) should be applied for 10 to 15 minutes every 2 to 3 hours for inflammation and pain, and immediately after activity that aggravates your symptoms. Use ice packs or an ice massage.  Heat treatment may be used before performing stretching and strengthening activities prescribed by your caregiver, physical therapist, or athletic trainer. Use a heat pack or a warm water soak. SEEK MEDICAL CARE IF:   Symptoms get worse or do not improve in 2 weeks, despite treatment.  New, unexplained symptoms develop. (Drugs used in treatment may produce side effects.) EXERCISES RANGE OF MOTION (ROM) AND STRETCHING EXERCISES - Meniscus Tear, Non-operative, Phase I These are some of the initial exercises with which you may start your rehabilitation program, until you see your caregiver again or until your symptoms are resolved. Remember:   These initial exercises are intended to be gentle. They will help you restore motion without increasing any swelling.  Completing these exercises allows less painful movement and prepares you for the more aggressive strengthening exercises in Phase II.  An effective stretch should be held for at least 30 seconds.  A stretch should never be painful. You should only feel a gentle lengthening or release in the stretched tissue. RANGE OF MOTION - Knee Flexion, Active  Lie on your back with both knees straight. (If this causes back discomfort, bend your healthy knee, placing your foot flat on the floor.)  Slowly slide your heel back toward your buttocks until you feel a gentle stretch in the front of your knee or thigh.  Hold for __________ seconds. Slowly slide your heel back to the starting position. Repeat __________ times. Complete this exercise __________ times per day.  RANGE OF MOTION - Knee Flexion and Extension, Active-Assisted  Sit on the edge of a table or chair with your thighs firmly  supported. It may be helpful to place a folded towel under the end of your right / left thigh.  Flexion (bending): Place the ankle of your healthy leg on top of the other ankle. Use your healthy leg to gently bend your right / left knee until you feel a mild tension across the top of your knee.  Hold for __________ seconds.  Extension (straightening): Switch your ankles so your right / left leg is on top. Use your healthy leg to straighten your right / left knee until you feel a mild tension on the backside of your knee.  Hold for __________ seconds. Repeat __________ times. Complete __________ times per day. STRETCH - Knee Flexion, Supine  Lie on the floor with your right / left heel and foot lightly touching the wall. (Place both feet on the wall if you do not use a door frame.)  Without using any effort, allow gravity to slide your foot down the wall slowly until you feel a gentle stretch in the front of your right / left knee.  Hold this stretch for __________ seconds. Then return the leg to the starting position, using your healthy leg for help, if needed. Repeat __________ times. Complete this stretch __________ times per day.  STRETCH - Knee Extension Sitting  Sit with your right / left leg/heel propped on another chair, coffee table, or foot stool.  Allow your leg muscles  to relax, letting gravity straighten out your knee.*  You should feel a stretch behind your right / left knee. Hold this position for __________ seconds. Repeat __________ times. Complete this stretch __________ times per day.  *Your physician, physical therapist or athletic trainer may instruct you place a __________ weight on your thigh, just above your kneecap, to deepen the stretch.  STRENGTHENING EXERCISES - Meniscus Tear, Non-operative, Phase I These exercises may help you when beginning to rehabilitate your injury. They may resolve your symptoms with or without further involvement from your physician,  physical therapist or athletic trainer. While completing these exercises, remember:   Muscles can gain both the endurance and the strength needed for everyday activities through controlled exercises.  Complete these exercises as instructed by your physician, physical therapist or athletic trainer. Progress the resistance and repetitions only as guided. STRENGTH - Quadriceps, Isometrics  Lie on your back with your right / left leg extended and your opposite knee bent.  Gradually tense the muscles in the front of your right / left thigh. You should see either your knee cap slide up toward your hip or increased dimpling just above the knee. This motion will push the back of the knee down toward the floor, mat, or bed on which you are lying.  Hold the muscle as tight as you can, without increasing your pain, for __________ seconds.  Relax the muscles slowly and completely between each repetition. Repeat __________ times. Complete this exercise __________ times per day.  STRENGTH - Quadriceps, Short Arcs   Lie on your back. Place a __________ inch towel roll under your right / left knee, so that the knee bends slightly.  Raise only your lower leg by tightening the muscles in the front of your thigh. Do not allow your thigh to rise.  Hold this position for __________ seconds. Repeat __________ times. Complete this exercise __________ times per day.  OPTIONAL ANKLE WEIGHTS: Begin with ____________________, but DO NOT exceed ____________________. Increase in 1 pound/0.5 kilogram increments. STRENGTH - Quadriceps, Straight Leg Raises  Quality counts! Watch for signs that the quadriceps muscle is working, to be sure you are strengthening the correct muscles and not "cheating" by substituting with healthier muscles.  Lay on your back with your right / left leg extended and your opposite knee bent.  Tense the muscles in the front of your right / left thigh. You should see either your knee cap slide  up or increased dimpling just above the knee. Your thigh may even shake a bit.  Tighten these muscles even more and raise your leg 4 to 6 inches off the floor. Hold for __________ seconds.  Keeping these muscles tense, lower your leg.  Relax the muscles slowly and completely in between each repetition. Repeat __________ times. Complete this exercise __________ times per day.  STRENGTH - Hamstring, Curls   Lay on your stomach with your legs extended. (If you lay on a bed, your feet may hang over the edge.)  Tighten the muscles in the back of your thigh to bend your right / left knee up to 90 degrees. Keep your hips flat on the bed.  Hold this position for __________ seconds.  Slowly lower your leg back to the starting position. Repeat __________ times. Complete this exercise __________ times per day.  STRENGTH - Quadriceps, Squats  Stand in a door frame so that your feet and knees are in line with the frame.  Use your hands for balance, not support, on the  frame.  Slowly lower your weight, bending at the hips and knees. Keep your lower legs upright so that they are parallel with the door frame. Squat only within the range that does not increase your knee pain. Never let your hips drop below your knees.  Slowly return upright, pushing with your legs, not pulling with your hands. Repeat __________ times. Complete this exercise __________ times per day.  STRENGTH - Quad/VMO, Isometric   Sit in a chair with your right / left knee slightly bent. With your fingertips, feel the VMO muscle just above the inside of your knee. The VMO is important in controlling the position of your kneecap.  Keeping your fingertips on this muscle. Without actually moving your leg, attempt to drive your knee down as if straightening your leg. You should feel your VMO tense. If you have a difficult time, you may wish to try the same exercise on your healthy knee first.  Tense this muscle as hard as you can  without increasing any knee pain.  Hold for __________ seconds. Relax the muscles slowly and completely in between each repetition. Repeat __________ times. Complete exercise __________ times per day.    This information is not intended to replace advice given to you by your health care provider. Make sure you discuss any questions you have with your health care provider.   Document Released: 09/30/1998 Document Revised: 01/31/2015 Document Reviewed: 12/29/2008 Elsevier Interactive Patient Education [employer].

## 2015-11-07 NOTE — Assessment & Plan Note (Signed)
Likely related to degenerative meniscus tear. Doubtful for large acute tear. Steroid injection today followed by home exercise program. Return in one month. Consider visco-supplementation

## 2015-11-21 ENCOUNTER — Ambulatory Visit (INDEPENDENT_AMBULATORY_CARE_PROVIDER_SITE_OTHER): Payer: Medicare Other | Admitting: Physician Assistant

## 2015-11-21 ENCOUNTER — Encounter: Payer: Self-pay | Admitting: Physician Assistant

## 2015-11-21 VITALS — BP 130/57 | HR 84 | Temp 98.4°F | Ht 70.0 in

## 2015-11-21 DIAGNOSIS — G4700 Insomnia, unspecified: Secondary | ICD-10-CM | POA: Insufficient documentation

## 2015-11-21 DIAGNOSIS — J0100 Acute maxillary sinusitis, unspecified: Secondary | ICD-10-CM

## 2015-11-21 MED ORDER — AMOXICILLIN-POT CLAVULANATE 875-125 MG PO TABS: 1.0000 | ORAL_TABLET | Freq: Two times a day (BID) | ORAL | Status: DC

## 2015-11-21 MED ORDER — ZOLPIDEM TARTRATE 5 MG PO TABS: 5.0000 mg | ORAL_TABLET | Freq: Every evening | ORAL | Status: DC | PRN

## 2015-11-21 MED ORDER — METHYLPREDNISOLONE SODIUM SUCC 125 MG IJ SOLR
125.0000 mg | Freq: Once | INTRAMUSCULAR | Status: AC
  Administered 2015-11-21: 125 mg via INTRAMUSCULAR

## 2015-11-21 NOTE — Progress Notes (Addendum)
   Subjective:    Patient ID: Aimee Little, female    DOB: 08-18-1961, 55 y.o.   MRN: [id number]  HPI  Patient is a 55 year old female that is presenting with persistent non-purulent rhinorrhea, fatigue, malaise, and headache since November 16, 2015. Patient states that she has associated sore throat. Patient has a past medical history relevant for chronic sinusitis. Patient denies nausea, vomiting, diarrhea, constipation, and chest pain. Patient denies sick contacts and recent travel. Patient has taken Allegra and Benadryl, which has minimally alleviated her symptoms. Additional concerns for this visit include addressing sleep disturbance. She had went off [place] because she was sleeping better. She now is having increasing problems with sleep. Would like a few tablets to use as needed.   Review of Systems Please see HPI    Objective:   Physical Exam  Constitutional: She is oriented to person, place, and time. She appears well-developed and well-nourished. No distress.  HENT:  Head: Normocephalic and atraumatic.  Nose: Nose normal.  Patient's posterior pharynx is erythematous with cobblestoning secondary to post-nasal drip.   Patient has maxillary sinus tenderness.   Eyes: Conjunctivae and EOM are normal. Pupils are equal, round, and reactive to light.  Cardiovascular: Normal rate, regular rhythm, normal heart sounds and intact distal pulses.  Exam reveals no gallop and no friction rub.   No murmur heard. Pulmonary/Chest: Effort normal and breath sounds normal. No respiratory distress. She has no wheezes. She has no rales.  Abdominal: Soft. Bowel sounds are normal. She exhibits no distension. There is no tenderness. There is no rebound and no guarding.  Musculoskeletal: Normal range of motion.  Neurological: She is alert and oriented to person, place, and time. She has normal reflexes.  Skin: Skin is warm and dry. She is not diaphoretic.  Psychiatric: She has a normal mood and affect.  Her behavior is normal. Judgment and thought content normal.      Assessment & Plan:   1. Acute Sinusitis  Patient has headache, malaise, fatigue, copious rhinorrhea and a past medical history of recurrent sinusitis. Patient was prescribed 875 mg/125 mg PO q12 hours for 7 days. Patient was also given an injection of solumedrol 125mg  IM in the office.   2. Sleep Disturbance Patient was prescribed 5 mg of Ambien to be used as needed to help sleep.

## 2015-11-21 NOTE — Patient Instructions (Signed)

## 2015-11-29 ENCOUNTER — Encounter: Payer: Self-pay | Admitting: Physician Assistant

## 2015-11-29 ENCOUNTER — Ambulatory Visit (INDEPENDENT_AMBULATORY_CARE_PROVIDER_SITE_OTHER): Payer: Medicare Other | Admitting: Physician Assistant

## 2015-11-29 VITALS — BP 132/66 | HR 77 | Ht 70.0 in

## 2015-11-29 DIAGNOSIS — J309 Allergic rhinitis, unspecified: Secondary | ICD-10-CM | POA: Diagnosis not present

## 2015-11-29 DIAGNOSIS — F332 Major depressive disorder, recurrent severe without psychotic features: Secondary | ICD-10-CM

## 2015-11-29 MED ORDER — METHYLPREDNISOLONE 4 MG PO TBPK: ORAL_TABLET | ORAL | Status: DC

## 2015-11-29 MED ORDER — IPRATROPIUM BROMIDE 0.06 % NA SOLN: 2.0000 | Freq: Four times a day (QID) | NASAL | Status: DC

## 2015-11-29 NOTE — Patient Instructions (Addendum)
ayr nasal gel/solution Increase cymbalta to 90mg .

## 2015-11-29 NOTE — Progress Notes (Addendum)
   Subjective:    Patient ID: Aimee Little, female    DOB: [birth year], 55 y.o.   MRN: [id number]  HPI  Patient is a 55 year old female that is presenting with worsening depression for the past five days. Patient states that she feels "sad all the time". Patient states that she normally experiences depression "during this time of year".  Patient expresses interest in increasing the dosage of her antidepressant pharmacotherapy regimen. Patient denies suicidal ideation and homicidal ideation. Additional concerns for this visit include persistent rhinorrhea and congestion refractory to flonase and benadryl.  Review of Systems    Please see HPI Objective:   Physical Exam  Constitutional: She is oriented to person, place, and time. She appears well-developed and well-nourished.  Patient is tearful during exam.   HENT:  Head: Normocephalic and atraumatic.  Bilateral nares erythematous.   Eyes: Conjunctivae and EOM are normal. Pupils are equal, round, and reactive to light.  Neck: Normal range of motion.  Cardiovascular: Normal rate, regular rhythm, normal heart sounds and intact distal pulses.   Pulmonary/Chest: Effort normal and breath sounds normal. No respiratory distress. She has no wheezes.  Musculoskeletal: Normal range of motion.  Neurological: She is alert and oriented to person, place, and time. No cranial nerve deficit.  Skin: Skin is warm and dry.  Psychiatric: Her behavior is normal.     Assessment & Plan:   1. Depression,severe, recurrent PHQ-9 was 20.  Patient's Cymbalta was increased to 90 mg daily. Follow up in 1 month.   2. Allergic Rhinitis  Patient was prescribed Atrovent and [place] nasal solution. Patient was prescribed a medrol dose pack but advised to try increasing Cymbalta first before starting. Pt already taking zyrtec daily.

## 2015-12-05 ENCOUNTER — Ambulatory Visit: Payer: Medicare Other | Admitting: Family Medicine

## 2015-12-22 ENCOUNTER — Other Ambulatory Visit: Payer: Self-pay | Admitting: Physician Assistant

## 2015-12-25 ENCOUNTER — Ambulatory Visit: Payer: Medicare Other | Admitting: Family Medicine

## 2015-12-27 ENCOUNTER — Other Ambulatory Visit: Payer: Self-pay | Admitting: *Deleted

## 2015-12-27 DIAGNOSIS — F32A Depression, unspecified: Secondary | ICD-10-CM

## 2015-12-27 DIAGNOSIS — F329 Major depressive disorder, single episode, unspecified: Secondary | ICD-10-CM

## 2015-12-27 MED ORDER — DULOXETINE HCL 30 MG PO CPEP: ORAL_CAPSULE | ORAL | Status: DC

## 2015-12-27 MED ORDER — FLUCONAZOLE 150 MG PO TABS: 150.0000 mg | ORAL_TABLET | Freq: Once | ORAL | Status: DC

## 2016-01-02 ENCOUNTER — Ambulatory Visit (INDEPENDENT_AMBULATORY_CARE_PROVIDER_SITE_OTHER): Payer: Medicare Other | Admitting: Physician Assistant

## 2016-01-02 ENCOUNTER — Encounter: Payer: Self-pay | Admitting: Physician Assistant

## 2016-01-02 VITALS — BP 150/54 | HR 94 | Ht 70.0 in

## 2016-01-02 DIAGNOSIS — N952 Postmenopausal atrophic vaginitis: Secondary | ICD-10-CM | POA: Insufficient documentation

## 2016-01-02 DIAGNOSIS — N899 Noninflammatory disorder of vagina, unspecified: Secondary | ICD-10-CM | POA: Diagnosis not present

## 2016-01-02 DIAGNOSIS — N898 Other specified noninflammatory disorders of vagina: Secondary | ICD-10-CM | POA: Diagnosis not present

## 2016-01-02 LAB — WET PREP FOR TRICH, YEAST, CLUE
Clue Cells Wet Prep HPF POC: NONE SEEN
Trich, Wet Prep: NONE SEEN
Yeast Wet Prep HPF POC: NONE SEEN

## 2016-01-02 MED ORDER — ESTRADIOL 0.1 MG/GM VA CREA: 1.0000 | TOPICAL_CREAM | Freq: Every day | VAGINAL | Status: DC

## 2016-01-02 NOTE — Progress Notes (Signed)
   Subjective:    Patient ID: Aimee Little, female    DOB: 1961/09/21, 55 y.o.   MRN: [id number]  HPI  Pt presents to the clinic with vaginal irritation that is constant for the last 3 weeks. She thought was yeast infection and tried diflucan and monistat. Does not seem to be helping. She denies any discharge. She is not sexually active. No dysuria, flank pain, fever, N/V/D, or chills.   Review of Systems  All other systems reviewed and are negative.      Objective:   Physical Exam  Constitutional: She is oriented to person, place, and time. She appears well-developed and well-nourished.  HENT:  Head: Normocephalic and atraumatic.  Genitourinary:  No active discharge on vaginal exam.  Tenderness with insertion of speculum.  Pale vaginal walls with evidence of erythema.   Neurological: She is alert and oriented to person, place, and time.  Psychiatric: She has a normal mood and affect. Her behavior is normal.          Assessment & Plan:  Vaginal irritation/vaginal atrophy- wet prep done today. Will call with results. Started estrace once a day for next 2 weeks then taper down to 1-3 times a week. Follow up as needed.

## 2016-01-18 ENCOUNTER — Other Ambulatory Visit: Payer: Self-pay | Admitting: Physician Assistant

## 2016-01-19 ENCOUNTER — Telehealth: Payer: Self-pay | Admitting: *Deleted

## 2016-01-19 NOTE — Telephone Encounter (Signed)
Received letter the cymbalta 30mg  TID is not going to be covered by insurance any longer. Called to submit PA. Cymbalta 90 mg is not available and this is why patient takes 30 mg TID as was communicated to rep. The case is pending pharmacist revie. Did also mention that pt has tried and failed Wellbutrin and she has been stable on Cymbalta for many years.

## 2016-01-22 NOTE — Telephone Encounter (Addendum)
Med approved through 09/29/2016  Message left on pt vm

## 2016-01-23 ENCOUNTER — Ambulatory Visit (INDEPENDENT_AMBULATORY_CARE_PROVIDER_SITE_OTHER): Payer: Medicare Other | Admitting: Family Medicine

## 2016-01-23 ENCOUNTER — Encounter: Payer: Self-pay | Admitting: Family Medicine

## 2016-01-23 VITALS — BP 124/78 | HR 91

## 2016-01-23 DIAGNOSIS — M25562 Pain in left knee: Secondary | ICD-10-CM | POA: Diagnosis not present

## 2016-01-23 NOTE — Progress Notes (Signed)
Aimee Little is a 55 y.o. female who presents to [facility] today for left knee pain. Patient has a history of months of left knee pain. In December 2016 she had steroid injection which did not help much. She denies any specific injury. She notes pain is worse in the anterior portion of the knee with standing from a seated position and activity. She denies any radiating pain weakness or numbness. She's tried over-the-counter pain medicines which have not been helpful. She had a contralateral right knee injection in February 2017 which helped a lot.   Past Medical History  Diagnosis Date  . Thyroid disease   . Depression   . Insomnia   . Fibromyalgia   . DDD (degenerative disc disease), lumbar    Past Surgical History  Procedure Laterality Date  . Abdominal hysterectomy      took uterus and both ovaries left   Social History  Substance Use Topics  . Smoking status: Former Smoker -- 0.50 packs/day    Quit date: 10/02/2013  . Smokeless tobacco: Not on file  . Alcohol Use: Not on file   family history includes Alcohol abuse in her brother; Depression in her mother; Diabetes in her sister; Heart attack in her father; Hyperlipidemia in her mother; Hypertension in her brother, mother, and sister.  ROS:  No headache, visual changes, nausea, vomiting, diarrhea, constipation, dizziness, abdominal pain, skin rash, fevers, chills, night sweats, weight loss, swollen lymph nodes, body aches, joint swelling, muscle aches, chest pain, shortness of breath, mood changes, visual or auditory hallucinations.    Medications: Current Outpatient Prescriptions  Medication Sig Dispense Refill  . AMBULATORY NON FORMULARY MEDICATION Compression stockings, please fit to size, pressure 20-40mmHg one pair one refill. Dx Venous Insufficiency 1 Units 1  . baclofen (LIORESAL) 10 MG tablet TAKE ONE TABLET BY MOUTH 3 TIMES A DAY 90 tablet 5  . botulinum toxin Type A  (BOTOX) 100 UNITS SOLR injection Inject 100 Units into the muscle.    Marland Kitchen DEXILANT 60 MG capsule TAKE 1 CAPSULE BY MOUTH ONCE DAILY 30 capsule 0  . DULoxetine (CYMBALTA) 30 MG capsule Take 3 capsules (90mg ) daily 270 capsule 0  . estradiol (ESTRACE VAGINAL) 0.1 MG/GM vaginal cream Place 1 Applicatorful vaginally at bedtime. For 2 weeks. Then taper dose to 1-3 times a week. 42.5 g 5  . fluticasone (FLONASE) 50 MCG/ACT nasal spray USE 1 TO 2 SPRAYS IN EACH NOSTRIL ONCE DAILY. 16 g 6  . gabapentin (NEURONTIN) 300 MG capsule Take 2 capsules (600 mg total) by mouth 3 (three) times daily as needed (pain/neuropathy). 180 capsule 1  . ipratropium (ATROVENT) 0.06 % nasal spray Place 2 sprays into both nostrils 4 (four) times daily. 15 mL 1  . levothyroxine (SYNTHROID, LEVOTHROID) 150 MCG tablet Take 1 tablet (150 mcg total) by mouth daily before breakfast. 30 tablet 5  . meloxicam (MOBIC) 15 MG tablet Take 1 tablet (15 mg total) by mouth daily. 30 tablet 2  . metoCLOPramide (REGLAN) 10 MG tablet Take 0.5-1 tablets (5-10 mg total) by mouth every 8 (eight) hours as needed for nausea (nausea/headache). 90 tablet 2  . ranitidine (ZANTAC) 300 MG tablet TAKE ONE TABLET BY MOUTH 2 TIMES A DAY 180 tablet 0  . rizatriptan (MAXALT-MLT) 10 MG disintegrating tablet TAKE ONE TABLET BY MOUTH AS NEEDED. MAY REPEAT IN 2 HOURS IF NEEDED. 10 tablet 3  . rOPINIRole (REQUIP) 0.25 MG tablet TAKE 1 TABLET BY MOUTH BEFORE BED (  MAY INCREASE BY 1 TABLET EVERY 4 DAYS UNTIL A MAXIMUM OF 4 TABLETS PER DAY) 120 tablet 0  . topiramate (TOPAMAX) 100 MG tablet Take 1 tablet (100 mg total) by mouth 2 (two) times daily. 60 tablet 5  . traMADol (ULTRAM) 50 MG tablet TAKE ONE TABLET EVERY 4 TO 6 HOURS AS NEEDED FOR PAIN 60 tablet 1  . valACYclovir (VALTREX) 1000 MG tablet TAKE TWO TABLETS 2 TIMES A DAY AT ONSET OF COLD SORE FOR 2 DAYS 60 tablet 0  . Vitamin D, Ergocalciferol, (DRISDOL) 50000 units CAPS capsule Take 1 capsule (50,000 Units total)  by mouth every 7 (seven) days. 12 capsule 4  . zolpidem (AMBIEN) 5 MG tablet Take 1 tablet (5 mg total) by mouth at bedtime as needed for sleep. 15 tablet 1   No current facility-administered medications for this visit.   Allergies  Allergen Reactions  . Doxepin Other (See Comments)  . Mirapex [Pramipexole Dihydrochloride]     Nausea and vomiting  . Phentermine     Stomach ache/increased anxiety.   . Pregabalin   . Gabapentin     Sleep walking     Exam:  BP 124/78 mmHg  Pulse 91  Wt  General: Well Developed, well nourished, and in no acute distress.  Neuro/Psych: Alert and oriented x3, extra-ocular muscles intact, able to move all 4 extremities, sensation grossly intact. Skin: Warm and dry, no rashes noted.  Respiratory: Not using accessory muscles, speaking in full sentences, trachea midline.  Cardiovascular: Pulses palpable, no extremity edema. Abdomen: Does not appear distended. MSK: Left knee: Normal-appearing with moderate effusion. Range of motion 0-120 with 1+ retropatellar crepitations. Nontender. Stable ligamentous exam. Intact extensor strength. Normal gait.  Procedure: Real-time Ultrasound Guided Injection of left knee  Device: GE Logiq E  Images permanently stored and available for review in the ultrasound unit. Verbal informed consent obtained. Discussed risks and benefits of procedure. Warned about infection bleeding damage to structures skin hypopigmentation and fat atrophy among others. Patient expresses understanding and agreement Time-out conducted.  Noted no overlying erythema, induration, or other signs of local infection.  Skin prepped in a sterile fashion.  Local anesthesia: Topical Ethyl chloride.  With sterile technique and under real time ultrasound guidance: 80 mg of Kenalog and 4 mL of Marcaine injected easily.  Completed without difficulty  Pain immediately resolved suggesting accurate placement of the medication.  Advised to call  if fevers/chills, erythema, induration, drainage, or persistent bleeding.  Images permanently stored and available for review in the ultrasound unit.  Impression: Technically successful ultrasound guided injection.     No results found for this or any previous visit (from the past 24 hour(s)). No results found.   55 year old woman with DJD of her left knee with exacerbation. She improves significantly following injection. Plan to recheck as needed. Continue home exercises water aerobics and oral NSAIDs as needed.

## 2016-01-23 NOTE — Patient Instructions (Signed)
Thank you for coming in today. Call or go to the ER if you develop a large red swollen joint with extreme pain or oozing puss.  Return as needed.   Arthritis Arthritis is a term that is commonly used to refer to joint pain or joint disease. There are more than 100 types of arthritis. CAUSES The most common cause of this condition is wear and tear of a joint. Other causes include:  Gout.  Inflammation of a joint.  An infection of a joint.  Sprains and other injuries near the joint.  A drug reaction or allergic reaction. In some cases, the cause may not be known. SYMPTOMS The main symptom of this condition is pain in the joint with movement. Other symptoms include:  Redness, swelling, or stiffness at a joint.  Warmth coming from the joint.  Fever.  Overall feeling of illness. DIAGNOSIS This condition may be diagnosed with a physical exam and tests, including:  Blood tests.  Urine tests.  Imaging tests, such as MRI, X-rays, or a CT scan. Sometimes, fluid is removed from a joint for testing. TREATMENT Treatment for this condition may involve:  Treatment of the cause, if it is known.  Rest.  Raising (elevating) the joint.  Applying cold or hot packs to the joint.  Medicines to improve symptoms and reduce inflammation.  Injections of a steroid such as cortisone into the joint to help reduce pain and inflammation. Depending on the cause of your arthritis, you may need to make lifestyle changes to reduce stress on your joint. These changes may include exercising more and losing weight. HOME CARE INSTRUCTIONS Medicines  Take over-the-counter and prescription medicines only as told by your health care provider.  Do not take aspirin to relieve pain if gout is suspected. Activities  Rest your joint if told by your health care provider. Rest is important when your disease is active and your joint feels painful, swollen, or stiff.  Avoid activities that make the pain  worse. It is important to balance activity with rest.  Exercise your joint regularly with range-of-motion exercises as told by your health care provider. Try doing low-impact exercise, such as:  Swimming.  Water aerobics.  Biking.  Walking. Joint Care  If your joint is swollen, keep it elevated if told by your health care provider.  If your joint feels stiff in the morning, try taking a warm shower.  If directed, apply heat to the joint. If you have diabetes, do not apply heat without permission from your health care provider.  Put a towel between the joint and the hot pack or heating pad.  Leave the heat on the area for 20-30 minutes.  If directed, apply ice to the joint:  Put ice in a plastic bag.  Place a towel between your skin and the bag.  Leave the ice on for 20 minutes, 2-3 times per day.  Keep all follow-up visits as told by your health care provider. This is important. SEEK MEDICAL CARE IF:  The pain gets worse.  You have a fever. SEEK IMMEDIATE MEDICAL CARE IF:  You develop severe joint pain, swelling, or redness.  Many joints become painful and swollen.  You develop severe back pain.  You develop severe weakness in your leg.  You cannot control your bladder or bowels.   This information is not intended to replace advice given to you by your health care provider. Make sure you discuss any questions you have with your health care provider.  Document Released: 10/24/2004 Document Revised: 06/07/2015 Document Reviewed: 12/12/2014 Elsevier Interactive Patient Education [employer].

## 2016-01-25 ENCOUNTER — Other Ambulatory Visit: Payer: Self-pay | Admitting: Physician Assistant

## 2016-01-26 ENCOUNTER — Other Ambulatory Visit: Payer: Self-pay | Admitting: Physician Assistant

## 2016-02-02 ENCOUNTER — Ambulatory Visit (INDEPENDENT_AMBULATORY_CARE_PROVIDER_SITE_OTHER): Payer: Medicare Other | Admitting: Physician Assistant

## 2016-02-02 ENCOUNTER — Encounter: Payer: Self-pay | Admitting: Physician Assistant

## 2016-02-02 VITALS — BP 128/46 | HR 82

## 2016-02-02 DIAGNOSIS — M549 Dorsalgia, unspecified: Secondary | ICD-10-CM

## 2016-02-02 DIAGNOSIS — M797 Fibromyalgia: Secondary | ICD-10-CM

## 2016-02-02 DIAGNOSIS — M5137 Other intervertebral disc degeneration, lumbosacral region: Secondary | ICD-10-CM

## 2016-02-02 MED ORDER — KETOROLAC TROMETHAMINE 60 MG/2ML IM SOLN
60.0000 mg | Freq: Once | INTRAMUSCULAR | Status: AC
  Administered 2016-02-02: 60 mg via INTRAMUSCULAR

## 2016-02-02 NOTE — Progress Notes (Signed)
   Subjective:    Patient ID: Aimee Little, female    DOB: 04-07-1961, 55 y.o.   MRN: [id number]  HPI Pt is a 55 yo female with LDD, fibromyalgia, low back pain. She woke up this morning with "whole body pain". She get's a lot of improvement with toradol. She has been doing a natural tumeric regimen and helping.    Review of Systems  All other systems reviewed and are negative.      Objective:   Physical Exam  Constitutional: She appears well-developed and well-nourished.  Cardiovascular: Normal rate, regular rhythm and normal heart sounds.   Psychiatric: She has a normal mood and affect. Her behavior is normal.          Assessment & Plan:  Lumbar DDD/fibromyaglia/low back pain- toradol 60mg  IM. Continue tumeric,cymbalta, neurontin, exercises.

## 2016-03-06 ENCOUNTER — Ambulatory Visit: Payer: Medicare Other | Admitting: Physician Assistant

## 2016-03-13 ENCOUNTER — Other Ambulatory Visit: Payer: Self-pay | Admitting: Physician Assistant

## 2016-03-18 ENCOUNTER — Other Ambulatory Visit: Payer: Self-pay | Admitting: *Deleted

## 2016-03-18 MED ORDER — ROPINIROLE HCL 0.25 MG PO TABS: ORAL_TABLET | ORAL | Status: DC

## 2016-03-18 MED ORDER — ZOLPIDEM TARTRATE 5 MG PO TABS: 5.0000 mg | ORAL_TABLET | Freq: Every evening | ORAL | Status: DC | PRN

## 2016-03-18 MED ORDER — DEXLANSOPRAZOLE 60 MG PO CPDR: 1.0000 | DELAYED_RELEASE_CAPSULE | Freq: Every day | ORAL | Status: DC

## 2016-03-19 ENCOUNTER — Encounter: Payer: Self-pay | Admitting: Physician Assistant

## 2016-03-19 ENCOUNTER — Ambulatory Visit (INDEPENDENT_AMBULATORY_CARE_PROVIDER_SITE_OTHER): Payer: Medicare Other | Admitting: Physician Assistant

## 2016-03-19 VITALS — BP 136/53 | HR 87 | Wt 222.0 lb

## 2016-03-19 DIAGNOSIS — F410 Panic disorder [episodic paroxysmal anxiety] without agoraphobia: Secondary | ICD-10-CM

## 2016-03-19 DIAGNOSIS — F3341 Major depressive disorder, recurrent, in partial remission: Secondary | ICD-10-CM

## 2016-03-19 DIAGNOSIS — F411 Generalized anxiety disorder: Secondary | ICD-10-CM | POA: Diagnosis not present

## 2016-03-19 MED ORDER — BUSPIRONE HCL 7.5 MG PO TABS: 7.5000 mg | ORAL_TABLET | Freq: Two times a day (BID) | ORAL | Status: DC

## 2016-03-19 NOTE — Progress Notes (Signed)
   Subjective:    Patient ID: Aimee Little, female    DOB: [birth year], 55 y.o.   MRN: [id number]  HPI Pt is a 55 yo female who presents to the clinic to follow up on anxiety. Pt has hx of depression, anxiety, and panic attacks. She had not been having panic attacks until about 3 weeks ago. She is not aware of any trigger. With her attacks she feels hot all over and a choking sensation. She feels like they happen more when she is in the car or she has to drive somewhere. She sits with a older woman on Saturday and Sunday nights and she always gets one before she leaves to go there. She feels more on edge. She does not feel "down" or "hopeless" like she has in the past. She is sleeping ok with ambien. She takes cymbalta daily.    Review of Systems  All other systems reviewed and are negative.      Objective:   Physical Exam  Constitutional: She is oriented to person, place, and time. She appears well-developed and well-nourished.  HENT:  Head: Normocephalic and atraumatic.  Cardiovascular: Normal rate, regular rhythm and normal heart sounds.   Pulmonary/Chest: Effort normal and breath sounds normal.  Neurological: She is alert and oriented to person, place, and time.  Psychiatric: She has a normal mood and affect. Her behavior is normal.          Assessment & Plan:  Depression- controlled currently on cymbalta.   GAD/panic attacks- she is not interested in therapy. Will add buspar to cymbalta. Follow up in 6 weeks. Encouraged her to exercise, practice deep breathing, and to plan a few get aways to help with stress.

## 2016-03-28 ENCOUNTER — Ambulatory Visit (INDEPENDENT_AMBULATORY_CARE_PROVIDER_SITE_OTHER): Payer: Medicare Other

## 2016-03-28 ENCOUNTER — Ambulatory Visit (INDEPENDENT_AMBULATORY_CARE_PROVIDER_SITE_OTHER): Payer: Medicare Other | Admitting: Family Medicine

## 2016-03-28 ENCOUNTER — Encounter: Payer: Self-pay | Admitting: Family Medicine

## 2016-03-28 VITALS — BP 129/79 | HR 76 | Wt 223.0 lb

## 2016-03-28 DIAGNOSIS — M1712 Unilateral primary osteoarthritis, left knee: Secondary | ICD-10-CM

## 2016-03-28 DIAGNOSIS — G8929 Other chronic pain: Secondary | ICD-10-CM

## 2016-03-28 DIAGNOSIS — M25561 Pain in right knee: Secondary | ICD-10-CM | POA: Diagnosis not present

## 2016-03-28 DIAGNOSIS — F329 Major depressive disorder, single episode, unspecified: Secondary | ICD-10-CM | POA: Diagnosis not present

## 2016-03-28 DIAGNOSIS — M25562 Pain in left knee: Secondary | ICD-10-CM

## 2016-03-28 DIAGNOSIS — F32A Depression, unspecified: Secondary | ICD-10-CM

## 2016-03-28 MED ORDER — DULOXETINE HCL 30 MG PO CPEP: ORAL_CAPSULE | ORAL | Status: DC

## 2016-03-28 MED ORDER — RIZATRIPTAN BENZOATE 10 MG PO TBDP: ORAL_TABLET | ORAL | Status: DC

## 2016-03-28 NOTE — Patient Instructions (Signed)
Thank you for coming in today. You should hear soon about MRI and Gel Shots.  Return following MRI.  Get xray today.  Call or go to the ER if you develop a large red swollen joint with extreme pain or oozing puss.

## 2016-03-28 NOTE — Progress Notes (Signed)
Submitted for approval on [employer]. Awaiting confirmation.  

## 2016-03-28 NOTE — Progress Notes (Signed)
Aimee Little is a 55 y.o. female who presents to [facility] today for left knee pain.  Patient notes continued severe left knee pain. She was seen at the end of April 2017 and received a steroid injection which helped a lot until about 2 weeks ago. She notes the pain has worsened significantly. She notes severe throbbing pain. Pain is worse with activity and better with rest. She takes tramadol for pain which helps some. Currently takes 30 mg of Cymbalta as well for her fibromyalgia. She denies any repeat injury.   Past Medical History  Diagnosis Date  . Thyroid disease   . Depression   . Insomnia   . Fibromyalgia   . DDD (degenerative disc disease), lumbar    Past Surgical History  Procedure Laterality Date  . Abdominal hysterectomy      took uterus and both ovaries left   Social History  Substance Use Topics  . Smoking status: Former Smoker -- 0.50 packs/day    Quit date: 10/02/2013  . Smokeless tobacco: Not on file  . Alcohol Use: Not on file   family history includes Alcohol abuse in her brother; Depression in her mother; Diabetes in her sister; Heart attack in her father; Hyperlipidemia in her mother; Hypertension in her brother, mother, and sister.  ROS:  No headache, visual changes, nausea, vomiting, diarrhea, constipation, dizziness, abdominal pain, skin rash, fevers, chills, night sweats, weight loss, swollen lymph nodes, body aches, joint swelling, muscle aches, chest pain, shortness of breath, mood changes, visual or auditory hallucinations.    Medications: Current Outpatient Prescriptions  Medication Sig Dispense Refill  . AMBULATORY NON FORMULARY MEDICATION Compression stockings, please fit to size, pressure 20-62mmHg one pair one refill. Dx Venous Insufficiency 1 Units 1  . baclofen (LIORESAL) 10 MG tablet TAKE ONE TABLET BY MOUTH 3 TIMES A DAY 90 tablet 5  . botulinum toxin Type A (BOTOX) 100 UNITS SOLR injection  Inject 100 Units into the muscle.    . busPIRone (BUSPAR) 7.5 MG tablet Take 1 tablet (7.5 mg total) by mouth 2 (two) times daily. 60 tablet 2  . dexlansoprazole (DEXILANT) 60 MG capsule Take 1 capsule (60 mg total) by mouth daily. 30 capsule 6  . DULoxetine (CYMBALTA) 30 MG capsule Take 3 capsules (90mg ) daily 270 capsule 1  . estradiol (ESTRACE VAGINAL) 0.1 MG/GM vaginal cream Place 1 Applicatorful vaginally at bedtime. For 2 weeks. Then taper dose to 1-3 times a week. 42.5 g 5  . fluticasone (FLONASE) 50 MCG/ACT nasal spray USE 1 TO 2 SPRAYS IN EACH NOSTRIL ONCE DAILY. 16 g 6  . gabapentin (NEURONTIN) 300 MG capsule Take 2 capsules (600 mg total) by mouth 3 (three) times daily as needed (pain/neuropathy). 180 capsule 1  . ipratropium (ATROVENT) 0.06 % nasal spray Place 2 sprays into both nostrils 4 (four) times daily. 15 mL 1  . levothyroxine (SYNTHROID, LEVOTHROID) 150 MCG tablet Take 1 tablet (150 mcg total) by mouth daily before breakfast. 30 tablet 5  . meloxicam (MOBIC) 15 MG tablet Take 1 tablet (15 mg total) by mouth daily. 30 tablet 2  . metoCLOPramide (REGLAN) 10 MG tablet Take 0.5-1 tablets (5-10 mg total) by mouth every 8 (eight) hours as needed for nausea (nausea/headache). 90 tablet 2  . ranitidine (ZANTAC) 300 MG tablet TAKE ONE TABLET BY MOUTH 2 TIMES A DAY 180 tablet 0  . rizatriptan (MAXALT-MLT) 10 MG disintegrating tablet TAKE ONE TABLET BY MOUTH AS NEEDED. MAY REPEAT  IN 2 HOURS IF NEEDED. 10 tablet 1  . rOPINIRole (REQUIP) 0.25 MG tablet TAKE 1 TABLET BY MOUTH BEFORE BED (MAY INCREASE BY 1 TABLET EVERY 4 DAYS UNTIL A MAXIMUM OF 4 TABLETS PER DAY) 120 tablet 2  . topiramate (TOPAMAX) 100 MG tablet Take 1 tablet (100 mg total) by mouth 2 (two) times daily. 60 tablet 5  . traMADol (ULTRAM) 50 MG tablet TAKE ONE TABLET EVERY 4 TO 6 HOURS AS NEEDED FOR PAIN 60 tablet 1  . valACYclovir (VALTREX) 1000 MG tablet TAKE TWO TABLETS 2 TIMES A DAY AT ONSET OF COLD SORE FOR 2 DAYS 60 tablet 0   . Vitamin D, Ergocalciferol, (DRISDOL) 50000 units CAPS capsule Take 1 capsule (50,000 Units total) by mouth every 7 (seven) days. 12 capsule 4  . zolpidem (AMBIEN) 5 MG tablet Take 1 tablet (5 mg total) by mouth at bedtime as needed. for sleep 30 tablet 2   No current facility-administered medications for this visit.   Allergies  Allergen Reactions  . Doxepin Other (See Comments)  . Levothyroxine     Other reaction(s): Other (See Comments) Reaction unknown  . Mirapex [Pramipexole Dihydrochloride]     Nausea and vomiting  . Phentermine     Stomach ache/increased anxiety.   . Pregabalin   . Gabapentin     Sleep walking     Exam:  BP 129/79 mmHg  Pulse 76  Wt 223 lb (101.152 kg) General: Well Developed, well nourished, and in no acute distress.  Neuro/Psych: Alert and oriented x3, extra-ocular muscles intact, able to move all 4 extremities, sensation grossly intact. Skin: Warm and dry, no rashes noted.  Respiratory: Not using accessory muscles, speaking in full sentences, trachea midline.  Cardiovascular: Pulses palpable, no extremity edema. Abdomen: Does not appear distended. MSK: Left knee mild effusion otherwise normal-appearing Range of motion 0-120 with 1+ respiratory crepitations Nontender Stable ligamentous exam Negative McMurray's testing   Procedure: Real-time Ultrasound Guided Injection of left knee  Device: GE Logiq E  Images permanently stored and available for review in the ultrasound unit. Verbal informed consent obtained. Discussed risks and benefits of procedure. Warned about infection bleeding damage to structures skin hypopigmentation and fat atrophy among others. Patient expresses understanding and agreement Time-out conducted.  Noted no overlying erythema, induration, or other signs of local infection.  Skin prepped in a sterile fashion.  Local anesthesia: Topical Ethyl chloride.  With sterile technique and under real time ultrasound  guidance: 80 mg of Kenalog and 4 mL of Marcaine injected easily.  Completed without difficulty  Pain immediately resolved suggesting accurate placement of the medication.  Advised to call if fevers/chills, erythema, induration, drainage, or persistent bleeding.  Images permanently stored and available for review in the ultrasound unit.  Impression: Technically successful ultrasound guided injection.     X-ray left knee dated 02/13/2015 CLINICAL DATA: Patient states that she has had arthritis and has had left anteriolateral knee pains x 8 years, no injury, denies any right knee pains, no other complaints  EXAM: LEFT KNEE - COMPLETE 4+ VIEW  COMPARISON: None.  FINDINGS: No fracture. No bone lesion.  The joints are normally spaced and aligned. There is some subtle irregularity along the dorsal margin of the left patella. No other arthropathic change. Minimal left joint effusion. Soft tissues are unremarkable.  IMPRESSION: 1. No fracture or acute finding. 2. Minimal degenerative/arthropathic change of the patellofemoral joint on the left. Minimal left joint effusion.   Electronically Signed  By: Lajean Manes  M.D.  On: 02/13/2015 08:47   No results found for this or any previous visit (from the past 24 hour(s)). No results found.   Assessment and Plan: 55 y.o. female with left knee pain. Patient's pain seems to be out of proportion to the appearance of her x-ray and May 2016. Her exam is consistent with significant DJD but the x-ray was pretty normal appearing Plan for repeat injection today. We'll also repeat knee x-ray today as there may have been significant change in the interval year. Additionally we will try to authorize Visco-supplementation and will obtain a knee MRI as the cause of her pain may be due to an OCD lesion or ligamentous injury or meniscus injury that is not apparent on x-ray.  Additionally we will increase Cymbalta from 30 mg daily to  60 mg daily as this may help her pain complaints.  Patient will follow-up after MRI

## 2016-03-29 ENCOUNTER — Telehealth: Payer: Self-pay | Admitting: Family Medicine

## 2016-03-29 MED ORDER — HYDROCODONE-ACETAMINOPHEN 5-325 MG PO TABS: 1.0000 | ORAL_TABLET | Freq: Four times a day (QID) | ORAL | Status: DC | PRN

## 2016-03-29 NOTE — Telephone Encounter (Signed)
Pt called and states she seen Dr. Georgina Snell yesterday for knee pain and is waiting on her Xray results and would like for you to call her with results and know she is still in pain

## 2016-03-29 NOTE — Telephone Encounter (Signed)
-----   Message from Georgiann Mccoy, [place] sent at 03/29/2016 11:35 AM EDT ----- Pt notified of results. Pt states that she is in a lot of pain and that mobic is giving her no relief. She would like to know if you would be willing to rx something for pain or recommend something OTC that she can take in addition to mobic. Please advise.

## 2016-03-29 NOTE — Telephone Encounter (Signed)
Charlena Cross will call the patient back

## 2016-03-29 NOTE — Telephone Encounter (Signed)
Pt notified. rx placed up front for pickup.

## 2016-03-29 NOTE — Telephone Encounter (Signed)
We'll use a limited amount of Norco. Patient will need to come pick the prescription up

## 2016-03-29 NOTE — Progress Notes (Signed)
Quick Note:  Knee xray is pretty normal appearing. MRI will help [place] figure out what is wrong. ______

## 2016-04-03 ENCOUNTER — Encounter: Payer: Self-pay | Admitting: Physician Assistant

## 2016-04-03 ENCOUNTER — Ambulatory Visit (INDEPENDENT_AMBULATORY_CARE_PROVIDER_SITE_OTHER): Payer: Medicare Other | Admitting: Physician Assistant

## 2016-04-03 VITALS — BP 126/56 | HR 96 | Ht 70.0 in | Wt 235.0 lb

## 2016-04-03 DIAGNOSIS — G2581 Restless legs syndrome: Secondary | ICD-10-CM

## 2016-04-03 DIAGNOSIS — M25562 Pain in left knee: Secondary | ICD-10-CM

## 2016-04-03 MED ORDER — ROPINIROLE HCL 1 MG PO TABS: 1.0000 mg | ORAL_TABLET | Freq: Three times a day (TID) | ORAL | Status: DC

## 2016-04-03 NOTE — Patient Instructions (Signed)
Restless Legs Syndrome Restless legs syndrome is a condition that causes uncomfortable feelings or sensations in the legs, especially while sitting or lying down. The sensations usually cause an overwhelming urge to move the legs. The arms can also sometimes be affected. The condition can range from mild to severe. The symptoms often interfere with a person's ability to sleep. CAUSES The cause of this condition is not known. RISK FACTORS This condition is more likely to develop in:  People who are older than age 50.  Pregnant women. In general, restless legs syndrome is more common in women than in men.  People who have a family history of the condition.  People who have certain medical conditions, such as iron deficiency, kidney disease, Parkinson disease, or nerve damage.  People who take certain medicines, such as medicines for high blood pressure, nausea, colds, allergies, depression, and some heart conditions. SYMPTOMS The main symptom of this condition is uncomfortable sensations in the legs. These sensations may be:  Described as pulling, tingling, prickling, throbbing, crawling, or burning.  Worse while you are sitting or lying down.  Worse during periods of rest or inactivity.  Worse at night, often interfering with your sleep.  Accompanied by a very strong urge to move your legs.  Temporarily relieved by movement of your legs. The sensations usually affect both sides of the body. The arms can also be affected, but this is rare. People who have this condition often have tiredness during the day because of their lack of sleep at night. DIAGNOSIS This condition may be diagnosed based on your description of the symptoms. You may also have tests, including blood tests, to check for other conditions that may lead to your symptoms. In some cases, you may be asked to spend some time in a sleep lab so your sleeping can be monitored. TREATMENT Treatment for this condition is  focused on managing the symptoms. Treatment may include:  Self-help and lifestyle changes.  Medicines. HOME CARE INSTRUCTIONS  Take medicines only as directed by your health care provider.  Try these methods to get temporary relief from the uncomfortable sensations:  Massage your legs.  Walk or stretch.  Take a cold or hot bath.  Practice good sleep habits. For example, go to bed and get up at the same time every day.  Exercise regularly.  Practice ways of relaxing, such as yoga or meditation.  Avoid caffeine and alcohol.  Do not use any tobacco products, including cigarettes, chewing tobacco, or electronic cigarettes. If you need help quitting, ask your health care provider.  Keep all follow-up visits as directed by your health care provider. This is important. SEEK MEDICAL CARE IF: Your symptoms do not improve with treatment, or they get worse.   This information is not intended to replace advice given to you by your health care provider. Make sure you discuss any questions you have with your health care provider.   Document Released: 09/06/2002 Document Revised: 01/31/2015 Document Reviewed: 09/12/2014 Elsevier Interactive Patient Education 2016 Else[employer] Inc[employer]  

## 2016-04-04 ENCOUNTER — Other Ambulatory Visit: Payer: Self-pay | Admitting: Physician Assistant

## 2016-04-05 NOTE — Progress Notes (Signed)
   Subjective:    Patient ID: Aimee Little, female    DOB: 1961-01-20, 55 y.o.   MRN: [id number]  HPI Pt presents to the clinic with worsening RLS. RLS is keeping her from sleeping at night. She is on requip .25mg  and admits to taking 2 some nights which does help. Her left knee continues to be painful. She states tablets of norco do not help and she wants capsules. cymbalta was increased at last visit.    Review of Systems  All other systems reviewed and are negative.      Objective:   Physical Exam  Constitutional: She is oriented to person, place, and time. She appears well-developed and well-nourished.  HENT:  Head: Normocephalic and atraumatic.  Cardiovascular: Normal rate, regular rhythm and normal heart sounds.   Pulmonary/Chest: Effort normal and breath sounds normal. She has no wheezes.  Neurological: She is alert and oriented to person, place, and time.  Psychiatric: She has a normal mood and affect. Her behavior is normal.          Assessment & Plan:  RLS- increased requip to 1mg  at bedtime. Given HO on prevention. Encouraged ferrous sulfate 325mg  daily. Worsening of symptoms could be due to increase in pain medications or even cymbalta. Will monitor.   Left knee pain- follow up with Dr. Georgina Snell. Not sure about capsules vs tablets. I am not aware of any pain rx that comes in capsules. She can call back if she has a pain rx that she request.

## 2016-04-09 ENCOUNTER — Other Ambulatory Visit: Payer: Self-pay | Admitting: Physician Assistant

## 2016-04-15 ENCOUNTER — Ambulatory Visit (INDEPENDENT_AMBULATORY_CARE_PROVIDER_SITE_OTHER): Payer: Medicare Other

## 2016-04-15 DIAGNOSIS — M179 Osteoarthritis of knee, unspecified: Secondary | ICD-10-CM | POA: Diagnosis not present

## 2016-04-15 DIAGNOSIS — M1712 Unilateral primary osteoarthritis, left knee: Secondary | ICD-10-CM | POA: Diagnosis not present

## 2016-04-15 DIAGNOSIS — M25462 Effusion, left knee: Secondary | ICD-10-CM

## 2016-04-15 DIAGNOSIS — M25562 Pain in left knee: Secondary | ICD-10-CM

## 2016-04-16 NOTE — Progress Notes (Signed)
Quick Note:  Lots of arthritis is present in the knee. There is a loose body in the knee that could be causing a problem. Return to clinic for detailed explanation and discussion of options. ______

## 2016-04-17 ENCOUNTER — Encounter: Payer: Self-pay | Admitting: Family Medicine

## 2016-04-17 ENCOUNTER — Ambulatory Visit (INDEPENDENT_AMBULATORY_CARE_PROVIDER_SITE_OTHER): Payer: Medicare Other | Admitting: Family Medicine

## 2016-04-17 VITALS — BP 144/73 | HR 85

## 2016-04-17 DIAGNOSIS — M2240 Chondromalacia patellae, unspecified knee: Secondary | ICD-10-CM | POA: Insufficient documentation

## 2016-04-17 DIAGNOSIS — M2342 Loose body in knee, left knee: Secondary | ICD-10-CM

## 2016-04-17 DIAGNOSIS — M25562 Pain in left knee: Secondary | ICD-10-CM

## 2016-04-17 DIAGNOSIS — M2242 Chondromalacia patellae, left knee: Secondary | ICD-10-CM | POA: Diagnosis not present

## 2016-04-17 DIAGNOSIS — M2340 Loose body in knee, unspecified knee: Secondary | ICD-10-CM | POA: Insufficient documentation

## 2016-04-17 NOTE — Progress Notes (Signed)
Aimee Little is a 55 y.o. female who presents to [facility] [place] today for follow-up knee pain. Patient is here today to follow-up her knee MRI. She's had significant left knee pain with only moderate improvement with injections. She notes she is always clearly pain-free immediately after the injection however the injections to stop last very long.  She notes the pain has returned since her last injection. She rates her pain is mostly moderate but limiting her activities. She does note some locking and catching and pinching.    Past Medical History  Diagnosis Date  . Thyroid disease   . Depression   . Insomnia   . Fibromyalgia   . DDD (degenerative disc disease), lumbar    Past Surgical History  Procedure Laterality Date  . Abdominal hysterectomy      took uterus and both ovaries left   Social History  Substance Use Topics  . Smoking status: Former Smoker -- 0.50 packs/day    Quit date: 10/02/2013  . Smokeless tobacco: Not on file  . Alcohol Use: Not on file   family history includes Alcohol abuse in her brother; Depression in her mother; Diabetes in her sister; Heart attack in her father; Hyperlipidemia in her mother; Hypertension in her brother, mother, and sister.  ROS as above:  Medications: Current Outpatient Prescriptions  Medication Sig Dispense Refill  . AMBULATORY NON FORMULARY MEDICATION Compression stockings, please fit to size, pressure 20-31mmHg one pair one refill. Dx Venous Insufficiency 1 Units 1  . baclofen (LIORESAL) 10 MG tablet TAKE ONE TABLET BY MOUTH 3 TIMES A DAY 90 tablet 5  . botulinum toxin Type A (BOTOX) 100 UNITS SOLR injection Inject 100 Units into the muscle.    . busPIRone (BUSPAR) 7.5 MG tablet Take 1 tablet (7.5 mg total) by mouth 2 (two) times daily. 60 tablet 2  . dexlansoprazole (DEXILANT) 60 MG capsule Take 1 capsule (60  mg total) by mouth daily. 30 capsule 6  . DULoxetine (CYMBALTA) 30 MG capsule Take 3 capsules (90mg ) daily 270 capsule 1  . estradiol (ESTRACE VAGINAL) 0.1 MG/GM vaginal cream Place 1 Applicatorful vaginally at bedtime. For 2 weeks. Then taper dose to 1-3 times a week. 42.5 g 5  . fluticasone (FLONASE) 50 MCG/ACT nasal spray USE 1 TO 2 SPRAYS IN EACH NOSTRIL ONCE DAILY. 16 g 6  . gabapentin (NEURONTIN) 300 MG capsule TAKE 2 CAPSULES BY MOUTH 3 TIMES A DAY AS NEEDED FOR PAIN/NEUROPATHY 180 capsule 4  . HYDROcodone-acetaminophen (NORCO/VICODIN) 5-325 MG tablet Take 1 tablet by mouth every 6 (six) hours as needed. 15 tablet 0  . ipratropium (ATROVENT) 0.06 % nasal spray Place 2 sprays into both nostrils 4 (four) times daily. 15 mL 1  . levothyroxine (SYNTHROID, LEVOTHROID) 150 MCG tablet TAKE ONE TABLET BY MOUTH EVERY DAY BEFORE BREAKFAST 30 tablet 0  . meloxicam (MOBIC) 15 MG tablet Take 1 tablet (15 mg total) by mouth daily. 30 tablet 2  . metoCLOPramide (REGLAN) 10 MG tablet Take 0.5-1 tablets (5-10 mg total) by mouth every 8 (eight) hours as needed for nausea (nausea/headache). 90 tablet 2  . Omega 3-6-9 Fatty Acids (OMEGA 3-6-9 COMPLEX PO) Take by mouth.    . ranitidine (ZANTAC) 300 MG tablet TAKE ONE TABLET BY MOUTH 2 TIMES A DAY 180 tablet 0  . rizatriptan (MAXALT-MLT) 10 MG disintegrating tablet TAKE ONE TABLET BY MOUTH AS NEEDED. MAY REPEAT IN 2 HOURS IF NEEDED. 10  tablet 1  . rOPINIRole (REQUIP) 1 MG tablet Take 1 tablet (1 mg total) by mouth 3 (three) times daily. 60 tablet 1  . topiramate (TOPAMAX) 100 MG tablet Take 1 tablet (100 mg total) by mouth 2 (two) times daily. 60 tablet 5  . traMADol (ULTRAM) 50 MG tablet TAKE ONE TABLET EVERY 4 TO 6 HOURS AS NEEDED FOR PAIN 60 tablet 1  . valACYclovir (VALTREX) 1000 MG tablet TAKE TWO TABLETS 2 TIMES A DAY AT ONSET OF COLD SORE FOR 2 DAYS 60 tablet 0  . Vitamin D, Ergocalciferol, (DRISDOL) 50000 units CAPS capsule Take 1 capsule (50,000 Units  total) by mouth every 7 (seven) days. 12 capsule 4  . VITAMIN E PO Take by mouth.    . zolpidem (AMBIEN) 5 MG tablet Take 1 tablet (5 mg total) by mouth at bedtime as needed. for sleep 30 tablet 2   No current facility-administered medications for this visit.   Allergies  Allergen Reactions  . Doxepin Other (See Comments)  . Levothyroxine     Other reaction(s): Other (See Comments) Reaction unknown  . Mirapex [Pramipexole Dihydrochloride]     Nausea and vomiting  . Phentermine     Stomach ache/increased anxiety.   . Pregabalin   . Gabapentin     Sleep walking     Exam:  BP 144/73 mmHg  Pulse 85  Wt  Gen: Well NAD Left Knee: No effusion normal range of motion which patellar crepitations on extension.   No results found for this or any previous visit (from the past 24 hour(s)). Mr Knee Left  Wo Contrast  04/15/2016  CLINICAL DATA:  Chronic knee pain. EXAM: MRI OF THE LEFT KNEE WITHOUT CONTRAST TECHNIQUE: Multiplanar, multisequence MR imaging of the knee was performed. No intravenous contrast was administered. COMPARISON:  03/28/2016 radiograph FINDINGS: MENISCI Medial meniscus: Mild degenerative signal inferiorly in the posterior horn without tear identified. Lateral meniscus:  Unremarkable LIGAMENTS Cruciates:  Unremarkable Collaterals:  Unremarkable CARTILAGE Patellofemoral: Generally moderate but with some severe articular cartilage thinning in the patellofemoral joint, most severe along the posterior patellar ridge and adjacent lateral facet where there is full-thickness loss of articular cartilage and a confluence of degenerative subcortical foci of edema. Mild marginal spurring. Medial:  Minimal chondral thinning with mild marginal spurring. Lateral: Up to moderate chondral thinning along the lateral tibial plateau. Joint: Trace knee joint effusion. 7 mm free osteochondral fragment projecting between the ACL and PCL on image 11/7. Confirmed on radiographs. Popliteal Fossa:   Unremarkable Extensor Mechanism:  Unremarkable Bones:  Small geode along the tibial spine. Other: No supplemental non-categorized findings. IMPRESSION: 1. Osteoarthritis, with moderate chondral thinning in the patellofemoral joint with some severe loss of articular cartilage along the posterior patellar ridge and adjacent lateral facet, and associated degenerative subcortical foci of edema. 2. There is a 7 mm free osteochondral fragment loose in the knee joint, currently residing between the ACL and the PCL. Trace knee joint effusion. Electronically Signed   By: Van Clines M.D.   On: 04/15/2016 14:08      Assessment and Plan: 55 y.o. female with knee pain.  The cause of the pain a think is multifactorial. Certainly she does have patellofemoral chondromalacia and DJD on both x-ray and MRI. This is clearly the source of some of her pain.  However she also has a loose body that I think is causing some mechanical symptoms and also some pain.  As she's failed conservative management at this point I  feel that a referral to orthopedic surgery for consultation and evaluation of arthroscopic removal of loose body is warranted.  [name] states that she is not ready for a knee replacement.   Discussed warning signs or symptoms. Please see discharge instructions. Patient expresses understanding.  CC: Iran Planas, PA-C

## 2016-04-17 NOTE — Patient Instructions (Signed)
Thank you for coming in today. You should hear from Dr Archie Endo office soon.   Knee Arthroscopy Knee arthroscopy is a surgical procedure that is used to examine the inside of your knee joint and repair any damage. The surgeon puts a small, lighted instrument with a camera on the tip (arthroscope) through a small incision in your knee. The camera sends pictures to a monitor in the operating room. Your surgeon uses those pictures to guide the surgical instruments through other incisions to the area of damage. Knee arthroscopy can be used to treat many types of knee problems. It may be used:  To repair a torn ligament.  To repair or remove damaged tissue.  To remove a fluid-filled sac (cyst) from your knee. LET [facility] CARE PROVIDER KNOW ABOUT:  Any allergies you have.  All medicines you are taking, including vitamins, herbs, eye drops, creams, and over-the-counter medicines.  Previous problems you or members of your family have had with the use of anesthetics.  Any blood disorders you have.  Previous surgeries you have had.  Any medical conditions you may have. RISKS AND COMPLICATIONS Generally, this is a safe procedure. However, problems may occur, including:  Infection.  Bleeding.  Damage to blood vessels, nerves, or structures of your knee.  A blood clot that forms in your leg and travels to your lung.  Failure to relieve symptoms. BEFORE THE PROCEDURE  Ask your health care provider about:  Changing or stopping your regular medicines. This is especially important if you are taking diabetes medicines or blood thinners.  Taking medicines such as aspirin and ibuprofen. These medicines can thin your blood. Do not take these medicines before your procedure if your health care provider instructs you not to.  Follow your health care provider's instructions about eating or drinking restrictions.  Plan to have someone take you home after the procedure.  If you go home  right after the procedure, plan to have someone with you for 24 hours.  Do not drink alcohol unless your health care provider says that you can.  Do not use any tobacco products, including cigarettes, chewing tobacco, or electronic cigarettes unless your health care provider says that you can. If you need help quitting, ask your health care provider.  You may have a physical exam. PROCEDURE  An IV tube will be inserted into one of your veins.  You will be given one or more of the following:  A medicine that helps you relax (sedative).  A medicine that numbs the area (local anesthetic).  A medicine that makes you fall asleep (general anesthetic).  A medicine that is injected into your spine that numbs the area below and slightly above the injection site (spinal anesthetic).  A medicine that is injected into an area of your body that numbs everything below the injection site (regional anesthetic).  A cuff may be placed around your upper leg to slow bleeding during the procedure.  The surgeon will make a small number of incisions around your knee.  Your knee joint will be flushed and filled with a germ-free (sterile) solution.  The arthroscope will be passed through an incision into your knee joint.  More instruments will be passed through other incisions to repair your knee as needed.  The fluid will be removed from your knee.  The incisions will be closed with adhesive strips or stitches (sutures).  A bandage (dressing) will be placed over your knee. The procedure may vary among health care providers and  hospitals. AFTER THE PROCEDURE  Your blood pressure, heart rate, breathing rate and blood oxygen level will be monitored often until the medicines you were given have worn off.  You may be given medicine for pain.  You may get crutches to help you walk without using your knee to support your body weight.  You may have to wear compression stockings. These stocking help to  prevent blood clots and reduce swelling in your legs.   This information is not intended to replace advice given to you by your health care provider. Make sure you discuss any questions you have with your health care provider.   Document Released: 09/13/2000 Document Revised: 01/31/2015 Document Reviewed: 09/12/2014 Elsevier Interactive Patient Education [employer].

## 2016-04-22 ENCOUNTER — Telehealth: Payer: Self-pay | Admitting: *Deleted

## 2016-04-22 NOTE — Telephone Encounter (Signed)
Patient left a message stating the tramadol is not helping with her knee pain and wants to know what else she can take

## 2016-04-23 NOTE — Telephone Encounter (Signed)
Message left on patients vm 

## 2016-04-23 NOTE — Telephone Encounter (Signed)
Stronger pain medicines such as hydrocodone are quite dangerous and can be very addictive. There are some alternatives aside from those that may help. Please return to clinic to have a detailed discussion about her options including risks versus benefits of dangerous medicines like hydrocodone.

## 2016-04-25 ENCOUNTER — Ambulatory Visit (INDEPENDENT_AMBULATORY_CARE_PROVIDER_SITE_OTHER): Payer: Medicare Other

## 2016-04-25 ENCOUNTER — Ambulatory Visit (INDEPENDENT_AMBULATORY_CARE_PROVIDER_SITE_OTHER): Payer: Medicare Other | Admitting: Family Medicine

## 2016-04-25 DIAGNOSIS — R1032 Left lower quadrant pain: Secondary | ICD-10-CM | POA: Diagnosis not present

## 2016-04-25 DIAGNOSIS — R1030 Lower abdominal pain, unspecified: Secondary | ICD-10-CM | POA: Diagnosis not present

## 2016-04-25 LAB — CBC WITH DIFFERENTIAL/PLATELET
BASOS PCT: 1 %
Basophils Absolute: 81 cells/uL (ref 0–200)
Eosinophils Absolute: 162 cells/uL (ref 15–500)
Eosinophils Relative: 2 %
HCT: 47.9 % — ABNORMAL HIGH (ref 35.0–45.0)
Hemoglobin: 15.9 g/dL — ABNORMAL HIGH (ref 11.7–15.5)
LYMPHS PCT: 29 %
Lymphs Abs: 2349 cells/uL (ref 850–3900)
MCH: 32.5 pg (ref 27.0–33.0)
MCHC: 33.2 g/dL (ref 32.0–36.0)
MCV: 98 fL (ref 80.0–100.0)
MONO ABS: 648 {cells}/uL (ref 200–950)
MONOS PCT: 8 %
MPV: 11.3 fL (ref 7.5–12.5)
Neutro Abs: 4860 cells/uL (ref 1500–7800)
Neutrophils Relative %: 60 %
PLATELETS: 211 10*3/uL (ref 140–400)
RBC: 4.89 MIL/uL (ref 3.80–5.10)
RDW: 13.5 % (ref 11.0–15.0)
WBC: 8.1 10*3/uL (ref 3.8–10.8)

## 2016-04-25 LAB — COMPREHENSIVE METABOLIC PANEL
ALK PHOS: 75 U/L (ref 33–130)
ALT: 15 U/L (ref 6–29)
AST: 14 U/L (ref 10–35)
Albumin: 4 g/dL (ref 3.6–5.1)
BILIRUBIN TOTAL: 0.5 mg/dL (ref 0.2–1.2)
BUN: 12 mg/dL (ref 7–25)
CO2: 24 mmol/L (ref 20–31)
Calcium: 8.9 mg/dL (ref 8.6–10.4)
Chloride: 107 mmol/L (ref 98–110)
Creat: 0.76 mg/dL (ref 0.50–1.05)
GLUCOSE: 95 mg/dL (ref 65–99)
Potassium: 4.2 mmol/L (ref 3.5–5.3)
Sodium: 141 mmol/L (ref 135–146)
Total Protein: 6.4 g/dL (ref 6.1–8.1)

## 2016-04-25 LAB — LIPASE: LIPASE: 8 U/L (ref 7–60)

## 2016-04-25 MED ORDER — METRONIDAZOLE 500 MG PO TABS: 500.0000 mg | ORAL_TABLET | Freq: Three times a day (TID) | ORAL | 0 refills | Status: DC

## 2016-04-25 MED ORDER — CIPROFLOXACIN HCL 500 MG PO TABS
500.0000 mg | ORAL_TABLET | Freq: Two times a day (BID) | ORAL | 0 refills | Status: DC
Start: 2016-04-25 — End: 2016-06-06

## 2016-04-25 NOTE — Progress Notes (Signed)
Aimee Little is a 55 y.o. female who presents to [facility]: [place] today for abnormal pain. Patient has a three-day history of lower abdominal pain. She notes pressure and discomfort that does interfere with sleep. She denies any vomiting or diarrhea. She denies any urinary frequency or urgency or dysuria. She notes she she's been having normal bowel movements as well with no blood in the stool. She denies any vaginal discharge or bleeding. She notes that she has a history of diverticulitis in her current symptoms are somewhat consistent with his previous symptoms. She feels well otherwise. She has not been able to take any medications yet.   Past Medical History:  Diagnosis Date  . DDD (degenerative disc disease), lumbar   . Depression   . Fibromyalgia   . Insomnia   . Thyroid disease    Past Surgical History:  Procedure Laterality Date  . ABDOMINAL HYSTERECTOMY     took uterus and both ovaries left   Social History  Substance Use Topics  . Smoking status: Former Smoker    Packs/day: 0.50    Quit date: 10/02/2013  . Smokeless tobacco: Not on file  . Alcohol use Not on file   family history includes Alcohol abuse in her brother; Depression in her mother; Diabetes in her sister; Heart attack in her father; Hyperlipidemia in her mother; Hypertension in her brother, mother, and sister.  ROS as above:  Medications: Current Outpatient Prescriptions  Medication Sig Dispense Refill  . AMBULATORY NON FORMULARY MEDICATION Compression stockings, please fit to size, pressure 20-57mmHg one pair one refill. Dx Venous Insufficiency 1 Units 1  . baclofen (LIORESAL) 10 MG tablet TAKE ONE TABLET BY MOUTH 3 TIMES A DAY 90 tablet 5  . botulinum toxin Type A (BOTOX) 100 UNITS SOLR injection Inject 100 Units into the muscle.    . busPIRone (BUSPAR) 7.5 MG tablet Take 1 tablet (7.5 mg  total) by mouth 2 (two) times daily. 60 tablet 2  . dexlansoprazole (DEXILANT) 60 MG capsule Take 1 capsule (60 mg total) by mouth daily. 30 capsule 6  . DULoxetine (CYMBALTA) 30 MG capsule Take 3 capsules (90mg ) daily 270 capsule 1  . estradiol (ESTRACE VAGINAL) 0.1 MG/GM vaginal cream Place 1 Applicatorful vaginally at bedtime. For 2 weeks. Then taper dose to 1-3 times a week. 42.5 g 5  . fluticasone (FLONASE) 50 MCG/ACT nasal spray USE 1 TO 2 SPRAYS IN EACH NOSTRIL ONCE DAILY. 16 g 6  . gabapentin (NEURONTIN) 300 MG capsule TAKE 2 CAPSULES BY MOUTH 3 TIMES A DAY AS NEEDED FOR PAIN/NEUROPATHY 180 capsule 4  . HYDROcodone-acetaminophen (NORCO/VICODIN) 5-325 MG tablet Take 1 tablet by mouth every 6 (six) hours as needed. 15 tablet 0  . ipratropium (ATROVENT) 0.06 % nasal spray Place 2 sprays into both nostrils 4 (four) times daily. 15 mL 1  . levothyroxine (SYNTHROID, LEVOTHROID) 150 MCG tablet TAKE ONE TABLET BY MOUTH EVERY DAY BEFORE BREAKFAST 30 tablet 0  . meloxicam (MOBIC) 15 MG tablet Take 1 tablet (15 mg total) by mouth daily. 30 tablet 2  . metoCLOPramide (REGLAN) 10 MG tablet Take 0.5-1 tablets (5-10 mg total) by mouth every 8 (eight) hours as needed for nausea (nausea/headache). 90 tablet 2  . Omega 3-6-9 Fatty Acids (OMEGA 3-6-9 COMPLEX PO) Take by mouth.    . ranitidine (ZANTAC) 300 MG tablet TAKE ONE TABLET BY MOUTH 2 TIMES A DAY 180 tablet 0  . rizatriptan (  MAXALT-MLT) 10 MG disintegrating tablet TAKE ONE TABLET BY MOUTH AS NEEDED. MAY REPEAT IN 2 HOURS IF NEEDED. 10 tablet 1  . rOPINIRole (REQUIP) 1 MG tablet Take 1 tablet (1 mg total) by mouth 3 (three) times daily. 60 tablet 1  . topiramate (TOPAMAX) 100 MG tablet Take 1 tablet (100 mg total) by mouth 2 (two) times daily. 60 tablet 5  . traMADol (ULTRAM) 50 MG tablet TAKE ONE TABLET EVERY 4 TO 6 HOURS AS NEEDED FOR PAIN 60 tablet 1  . valACYclovir (VALTREX) 1000 MG tablet TAKE TWO TABLETS 2 TIMES A DAY AT ONSET OF COLD SORE FOR 2  DAYS 60 tablet 0  . Vitamin D, Ergocalciferol, (DRISDOL) 50000 units CAPS capsule Take 1 capsule (50,000 Units total) by mouth every 7 (seven) days. 12 capsule 4  . VITAMIN E PO Take by mouth.    . zolpidem (AMBIEN) 5 MG tablet Take 1 tablet (5 mg total) by mouth at bedtime as needed. for sleep 30 tablet 2  . ciprofloxacin (CIPRO) 500 MG tablet Take 1 tablet (500 mg total) by mouth 2 (two) times daily. 14 tablet 0  . metroNIDAZOLE (FLAGYL) 500 MG tablet Take 1 tablet (500 mg total) by mouth 3 (three) times daily. 21 tablet 0   No current facility-administered medications for this visit.    Allergies  Allergen Reactions  . Doxepin Other (See Comments)  . Levothyroxine     Other reaction(s): Other (See Comments) Reaction unknown  . Mirapex [Pramipexole Dihydrochloride]     Nausea and vomiting  . Phentermine     Stomach ache/increased anxiety.   . Pregabalin   . Gabapentin     Sleep walking     Exam:  BP 138/78   Pulse 81   Wt 217 lb (98.4 kg)   BMI 31.14 kg/m  Gen: Well NAD HEENT: EOMI,  MMM Lungs: Normal work of breathing. CTABL Heart: RRR no MRG Abd: NABS, Soft. Nondistended, Tender to palpation left lower quadrant. No guarding or rebound. No CVA tenderness to percussion. Exts: Brisk capillary refill, warm and well perfused.   Point of care urinalysis normal  No results found for this or any previous visit (from the past 24 hour(s)). No results found.    Assessment and Plan: 55 y.o. female with Left lower quadrant abdominal pain. She notes the pain is consistent with previous episodes of diverticulitis. I think this is a likely diagnosis. A she is afebrile unable to eat and drink will try outpatient management with Cipro and Flagyl. We'll obtain a CMP, CBC and lipase as well as a KUB x-ray. Additionally recommended a clear liquid diet. Discussed warning signs or symptoms.    Orders Placed This Encounter  Procedures  . DG Abd 1 View    Order Specific Question:    Reason for exam:    Answer:   abdominal pain suspect constipation    Order Specific Question:   Is the patient pregnant?    Answer:   No    Order Specific Question:   Preferred imaging location?    Answer:   [facility]  . Comprehensive metabolic panel    Order Specific Question:   Has the patient fasted?    Answer:   No  . CBC with Differential/Platelet  . Lipase    Discussed warning signs or symptoms. Please see discharge instructions. Patient expresses understanding.

## 2016-04-25 NOTE — Patient Instructions (Signed)
Thank you for coming in today. Get labs and xray today.  Start cipro and flagyl.  Let me know if you worsen.  Eat a clear liquid diet for a few days.  If your belly pain worsens, or you have high fever, bad vomiting, blood in your stool or black tarry stool go to the Emergency Room.    Diverticulitis Diverticulitis is inflammation or infection of small pouches in your colon that form when you have a condition called diverticulosis. The pouches in your colon are called diverticula. Your colon, or large intestine, is where water is absorbed and stool is formed. Complications of diverticulitis can include:  Bleeding.  Severe infection.  Severe pain.  Perforation of your colon.  Obstruction of your colon. CAUSES  Diverticulitis is caused by bacteria. Diverticulitis happens when stool becomes trapped in diverticula. This allows bacteria to grow in the diverticula, which can lead to inflammation and infection. RISK FACTORS People with diverticulosis are at risk for diverticulitis. Eating a diet that does not include enough fiber from fruits and vegetables may make diverticulitis more likely to develop. SYMPTOMS  Symptoms of diverticulitis may include:  Abdominal pain and tenderness. The pain is normally located on the left side of the abdomen, but may occur in other areas.  Fever and chills.  Bloating.  Cramping.  Nausea.  Vomiting.  Constipation.  Diarrhea.  Blood in your stool. DIAGNOSIS  Your health care provider will ask you about your medical history and do a physical exam. You may need to have tests done because many medical conditions can cause the same symptoms as diverticulitis. Tests may include:  Blood tests.  Urine tests.  Imaging tests of the abdomen, including X-rays and CT scans. When your condition is under control, your health care provider may recommend that you have a colonoscopy. A colonoscopy can show how severe your diverticula are and whether  something else is causing your symptoms. TREATMENT  Most cases of diverticulitis are mild and can be treated at home. Treatment may include:  Taking over-the-counter pain medicines.  Following a clear liquid diet.  Taking antibiotic medicines by mouth for 7-10 days. More severe cases may be treated at a hospital. Treatment may include:  Not eating or drinking.  Taking prescription pain medicine.  Receiving antibiotic medicines through an IV tube.  Receiving fluids and nutrition through an IV tube.  Surgery. HOME CARE INSTRUCTIONS   Follow your health care provider's instructions carefully.  Follow a full liquid diet or other diet as directed by your health care provider. After your symptoms improve, your health care provider may tell you to change your diet. He or she may recommend you eat a high-fiber diet. Fruits and vegetables are good sources of fiber. Fiber makes it easier to pass stool.  Take fiber supplements or probiotics as directed by your health care provider.  Only take medicines as directed by your health care provider.  Keep all your follow-up appointments. SEEK MEDICAL CARE IF:   Your pain does not improve.  You have a hard time eating food.  Your bowel movements do not return to normal. SEEK IMMEDIATE MEDICAL CARE IF:   Your pain becomes worse.  Your symptoms do not get better.  Your symptoms suddenly get worse.  You have a fever.  You have repeated vomiting.  You have bloody or black, tarry stools. MAKE SURE YOU:   Understand these instructions.  Will watch your condition.  Will get help right away if you are not doing  well or get worse.   This information is not intended to replace advice given to you by your health care provider. Make sure you discuss any questions you have with your health care provider.   Document Released: 06/26/2005 Document Revised: 09/21/2013 Document Reviewed: 08/11/2013 Elsevier Interactive Patient Education  [employer].

## 2016-05-07 ENCOUNTER — Other Ambulatory Visit: Payer: Self-pay | Admitting: Physician Assistant

## 2016-06-03 ENCOUNTER — Other Ambulatory Visit: Payer: Self-pay | Admitting: Physician Assistant

## 2016-06-05 ENCOUNTER — Ambulatory Visit: Payer: Medicare Other | Admitting: Physician Assistant

## 2016-06-06 ENCOUNTER — Ambulatory Visit (INDEPENDENT_AMBULATORY_CARE_PROVIDER_SITE_OTHER): Payer: Medicare Other | Admitting: Family Medicine

## 2016-06-06 VITALS — BP 127/59 | HR 87

## 2016-06-06 DIAGNOSIS — M5440 Lumbago with sciatica, unspecified side: Secondary | ICD-10-CM | POA: Diagnosis not present

## 2016-06-06 DIAGNOSIS — M797 Fibromyalgia: Secondary | ICD-10-CM | POA: Diagnosis not present

## 2016-06-06 MED ORDER — HYDROCODONE-ACETAMINOPHEN 5-325 MG PO TABS: 1.0000 | ORAL_TABLET | Freq: Four times a day (QID) | ORAL | 0 refills | Status: DC | PRN

## 2016-06-06 MED ORDER — KETOROLAC TROMETHAMINE 30 MG/ML IJ SOLN
30.0000 mg | Freq: Once | INTRAMUSCULAR | Status: AC
  Administered 2016-06-06: 30 mg via INTRAMUSCULAR

## 2016-06-06 NOTE — Progress Notes (Signed)
Aimee Little is a 55 y.o. female who presents to [facility] today for low back pain and fibromyalgia flare.  Neck pain: Patient is a history of recurrent intermittent chronic low back pain. She is doing reasonably well until about a week ago when she had a flareup of low back pain. This occurred after cleaning a Letter box. She denies any injury he denies any significant radiating pain weakness or numbness bowel bladder dysfunction.  She's tried her usual combination of meloxicam Cymbalta and gabapentin which have not helped much. The pain is severe and interfering with activity.  Additionally patient has a flareup of her fibromyalgia around the same time as her back pain started. Been present for about a week without injury or inciting event. She attributes the change in weather to the cause of pain. She notes muscle aches and pain across her entire body. She denies any tick bites. No fevers or chills nausea vomiting or diarrhea. Medication regimen is the same as above.  She notes in the past she's done well with the Toradol shot and Norco for her back pain.   Past Medical History:  Diagnosis Date  . DDD (degenerative disc disease), lumbar   . Depression   . Fibromyalgia   . Insomnia   . Thyroid disease    Past Surgical History:  Procedure Laterality Date  . ABDOMINAL HYSTERECTOMY     took uterus and both ovaries left   Social History  Substance Use Topics  . Smoking status: Former Smoker    Packs/day: 0.50    Quit date: 10/02/2013  . Smokeless tobacco: Not on file  . Alcohol use Not on file   family history includes Alcohol abuse in her brother; Depression in her mother; Diabetes in her sister; Heart attack in her father; Hyperlipidemia in her mother; Hypertension in her brother, mother, and sister.  ROS as above:  Medications: Current Outpatient  Prescriptions  Medication Sig Dispense Refill  . AMBULATORY NON FORMULARY MEDICATION Compression stockings, please fit to size, pressure 20-67mmHg one pair one refill. Dx Venous Insufficiency 1 Units 1  . baclofen (LIORESAL) 10 MG tablet TAKE ONE TABLET BY MOUTH 3 TIMES A DAY 90 tablet 5  . botulinum toxin Type A (BOTOX) 100 UNITS SOLR injection Inject 100 Units into the muscle.    . busPIRone (BUSPAR) 7.5 MG tablet Take 1 tablet (7.5 mg total) by mouth 2 (two) times daily. 60 tablet 2  . dexlansoprazole (DEXILANT) 60 MG capsule Take 1 capsule (60 mg total) by mouth daily. 30 capsule 6  . DULoxetine (CYMBALTA) 30 MG capsule Take 3 capsules (90mg ) daily 270 capsule 1  . estradiol (ESTRACE VAGINAL) 0.1 MG/GM vaginal cream Place 1 Applicatorful vaginally at bedtime. For 2 weeks. Then taper dose to 1-3 times a week. 42.5 g 5  . fluticasone (FLONASE) 50 MCG/ACT nasal spray USE 1 TO 2 SPRAYS IN EACH NOSTRIL ONCE DAILY. 16 g 6  . gabapentin (NEURONTIN) 300 MG capsule TAKE 2 CAPSULES BY MOUTH 3 TIMES A DAY AS NEEDED FOR PAIN/NEUROPATHY 180 capsule 4  . HYDROcodone-acetaminophen (NORCO/VICODIN) 5-325 MG tablet Take 1 tablet by mouth every 6 (six) hours as needed. 15 tablet 0  . ipratropium (ATROVENT) 0.06 % nasal spray Place 2 sprays into both nostrils 4 (four) times daily. 15 mL 1  . levothyroxine (SYNTHROID, LEVOTHROID) 150 MCG tablet TAKE ONE TABLET BY MOUTH EVERY DAY BEFORE BREAKFAST 30 tablet 0  .  meloxicam (MOBIC) 15 MG tablet Take 1 tablet (15 mg total) by mouth daily. 30 tablet 2  . metoCLOPramide (REGLAN) 10 MG tablet Take 0.5-1 tablets (5-10 mg total) by mouth every 8 (eight) hours as needed for nausea (nausea/headache). 90 tablet 2  . metroNIDAZOLE (FLAGYL) 500 MG tablet Take 1 tablet (500 mg total) by mouth 3 (three) times daily. 21 tablet 0  . Omega 3-6-9 Fatty Acids (OMEGA 3-6-9 COMPLEX PO) Take by mouth.    . ranitidine (ZANTAC) 300 MG tablet TAKE ONE TABLET BY MOUTH 2 TIMES A DAY 180 tablet  0  . rizatriptan (MAXALT-MLT) 10 MG disintegrating tablet TAKE ONE TABLET BY MOUTH AS NEEDED. MAY REPEAT IN 2 HOURS IF NEEDED. 10 tablet 1  . rOPINIRole (REQUIP) 1 MG tablet Take 1 tablet (1 mg total) by mouth 3 (three) times daily. 60 tablet 1  . topiramate (TOPAMAX) 100 MG tablet Take 1 tablet (100 mg total) by mouth 2 (two) times daily. 60 tablet 5  . traMADol (ULTRAM) 50 MG tablet TAKE ONE TABLET EVERY 4 TO 6 HOURS AS NEEDED FOR PAIN 60 tablet 1  . valACYclovir (VALTREX) 1000 MG tablet TAKE TWO TABLETS 2 TIMES A DAY AT ONSET OF COLD SORE FOR 2 DAYS 60 tablet 0  . Vitamin D, Ergocalciferol, (DRISDOL) 50000 units CAPS capsule Take 1 capsule (50,000 Units total) by mouth every 7 (seven) days. 12 capsule 4  . VITAMIN E PO Take by mouth.    . zolpidem (AMBIEN) 5 MG tablet Take 1 tablet (5 mg total) by mouth at bedtime as needed. for sleep 30 tablet 2   No current facility-administered medications for this visit.    Allergies  Allergen Reactions  . Doxepin Other (See Comments)  . Levothyroxine     Other reaction(s): Other (See Comments) Reaction unknown  . Mirapex [Pramipexole Dihydrochloride]     Nausea and vomiting  . Phentermine     Stomach ache/increased anxiety.   . Pregabalin   . Gabapentin     Sleep walking     Exam:  BP (!) 127/59   Pulse 87  Gen: Well NAD HEENT: EOMI,  MMM Lungs: Normal work of breathing. CTABL Heart: RRR no MRG Abd: NABS, Soft. Nondistended, Nontender Exts: Brisk capillary refill, warm and well perfused. L spine: Tender to palpation bilateral lumbar paraspinals. Nontender midline. Pain with flexion of her back motion is normal. Lower extremity strength is equal and normal throughout. The patient multiple painful areas in the muscles upper and lower extremities.   No results found for this or any previous visit (from the past 24 hour(s)). No results found.    Assessment and Plan: 55 y.o. female with lumbosacral strain. Plan for Toradol shot as  noted above 30 mg IM 1 and Norco. Continue current regimen. Recommend physical therapy however patient declines at this time.   Additionally patient has a fibromyalgia flare. Plan to continue activities as tolerated and continue current chronic medications.   No orders of the defined types were placed in this encounter.   Discussed warning signs or symptoms. Please see discharge instructions. Patient expresses understanding.

## 2016-06-06 NOTE — Patient Instructions (Signed)
Thank you for coming in today. Continue current therapy.  Follow up as needed.  I do recommend Physical Therapy.  Please let me know if you want to go.  Stay active  Use a heating pad.    Lumbosacral Strain Lumbosacral strain is a strain of any of the parts that make up your lumbosacral vertebrae. Your lumbosacral vertebrae are the bones that make up the lower third of your backbone. Your lumbosacral vertebrae are held together by muscles and tough, fibrous tissue (ligaments).  CAUSES  A sudden blow to your back can cause lumbosacral strain. Also, anything that causes an excessive stretch of the muscles in the low back can cause this strain. This is typically seen when people exert themselves strenuously, fall, lift heavy objects, bend, or crouch repeatedly. RISK FACTORS  Physically demanding work.  Participation in pushing or pulling sports or sports that require a sudden twist of the back (tennis, golf, baseball).  Weight lifting.  Excessive lower back curvature.  Forward-tilted pelvis.  Weak back or abdominal muscles or both.  Tight hamstrings. SIGNS AND SYMPTOMS  Lumbosacral strain may cause pain in the area of your injury or pain that moves (radiates) down your leg.  DIAGNOSIS Your health care provider can often diagnose lumbosacral strain through a physical exam. In some cases, you may need tests such as X-ray exams.  TREATMENT  Treatment for your lower back injury depends on many factors that your clinician will have to evaluate. However, most treatment will include the use of anti-inflammatory medicines. HOME CARE INSTRUCTIONS   Avoid hard physical activities (tennis, racquetball, waterskiing) if you are not in proper physical condition for it. This may aggravate or create problems.  If you have a back problem, avoid sports requiring sudden body movements. Swimming and walking are generally safer activities.  Maintain good posture.  Maintain a healthy weight.  For  acute conditions, you may put ice on the injured area.  Put ice in a plastic bag.  Place a towel between your skin and the bag.  Leave the ice on for 20 minutes, 2-3 times a day.  When the low back starts healing, stretching and strengthening exercises may be recommended. SEEK MEDICAL CARE IF:  Your back pain is getting worse.  You experience severe back pain not relieved with medicines. SEEK IMMEDIATE MEDICAL CARE IF:   You have numbness, tingling, weakness, or problems with the use of your arms or legs.  There is a change in bowel or bladder control.  You have increasing pain in any area of the body, including your belly (abdomen).  You notice shortness of breath, dizziness, or feel faint.  You feel sick to your stomach (nauseous), are throwing up (vomiting), or become sweaty.  You notice discoloration of your toes or legs, or your feet get very cold. MAKE SURE YOU:   Understand these instructions.  Will watch your condition.  Will get help right away if you are not doing well or get worse.   This information is not intended to replace advice given to you by your health care provider. Make sure you discuss any questions you have with your health care provider.   Document Released: 06/26/2005 Document Revised: 10/07/2014 Document Reviewed: 05/05/2013 Elsevier Interactive Patient Education [employer].

## 2016-06-06 NOTE — Addendum Note (Signed)
Addended by: Delrae Alfred on: 06/06/2016 03:37 PM   Modules accepted: Orders

## 2016-06-28 ENCOUNTER — Other Ambulatory Visit: Payer: Self-pay | Admitting: Physician Assistant

## 2016-06-28 ENCOUNTER — Telehealth: Payer: Self-pay

## 2016-06-28 MED ORDER — HYDROCODONE-ACETAMINOPHEN 5-325 MG PO TABS: 1.0000 | ORAL_TABLET | Freq: Four times a day (QID) | ORAL | 0 refills | Status: DC | PRN

## 2016-06-28 NOTE — Telephone Encounter (Signed)
Printed and call pt for pick up.

## 2016-06-28 NOTE — Telephone Encounter (Signed)
Pt.notified

## 2016-06-28 NOTE — Telephone Encounter (Signed)
Pt reports that she is having server back pain.  She tried getting in to be seen today but everyone is booked up. She has an appointment with on Tuesday 07/02/16.  She is asking for a Rx of 15 Vicodin til she's seen. Please advise.

## 2016-06-28 NOTE — Progress Notes (Signed)
Pt called in with back pain. Has appt on Tuesday next week.

## 2016-07-02 ENCOUNTER — Encounter: Payer: Self-pay | Admitting: Physician Assistant

## 2016-07-02 ENCOUNTER — Ambulatory Visit (INDEPENDENT_AMBULATORY_CARE_PROVIDER_SITE_OTHER): Payer: Medicare Other | Admitting: Physician Assistant

## 2016-07-02 VITALS — BP 137/81 | HR 88 | Ht 70.0 in | Wt 221.0 lb

## 2016-07-02 DIAGNOSIS — F331 Major depressive disorder, recurrent, moderate: Secondary | ICD-10-CM | POA: Diagnosis not present

## 2016-07-02 DIAGNOSIS — E039 Hypothyroidism, unspecified: Secondary | ICD-10-CM

## 2016-07-02 DIAGNOSIS — Z23 Encounter for immunization: Secondary | ICD-10-CM | POA: Diagnosis not present

## 2016-07-02 MED ORDER — BUSPIRONE HCL 7.5 MG PO TABS
7.5000 mg | ORAL_TABLET | Freq: Three times a day (TID) | ORAL | 2 refills | Status: DC
Start: 2016-07-02 — End: 2016-07-11

## 2016-07-02 NOTE — Progress Notes (Signed)
   Subjective:    Patient ID: Aimee Little, female    DOB: 1961/04/23, 55 y.o.   MRN: [id number]  HPI  Pt is a  presents to the clinic to follow up on back pain that I sent a few hydrocodone in for last week. Back pain is midline 7/10 and no radiation down legs. No bowel or bladder dysfunction. No saddle anesthesia. No injury. She just feels like her whole body aches. She admits she has been really depressed for the last few weeks. Her son will not talk to her and it is hitting home that she may never have a relationship with him. She denies any suicidal or homicidal thoughts.     Review of Systems  All other systems reviewed and are negative.      Objective:   Physical Exam  Constitutional: She is oriented to person, place, and time. She appears well-developed and well-nourished.  HENT:  Head: Normocephalic and atraumatic.  Cardiovascular: Normal rate, regular rhythm and normal heart sounds.   Pulmonary/Chest: Effort normal and breath sounds normal.  Neurological: She is alert and oriented to person, place, and time.  Psychiatric:  Flat affect and tearful.           Assessment & Plan:  MDD/fibromyaglia flare- I do not want to make any medication adjustments today. She is going through a lot with her son not talking to her now. I did increase buspar to three times a day. Encouraged her to start counseling. Reassured her that I feel her recent change in pain is due to her mood.   Hypothyroidism- will check TSH level and see if meds need to be adjusted.

## 2016-07-03 ENCOUNTER — Other Ambulatory Visit: Payer: Self-pay | Admitting: Physician Assistant

## 2016-07-03 ENCOUNTER — Other Ambulatory Visit: Payer: Self-pay

## 2016-07-03 LAB — TSH: TSH: 1.48 m[IU]/L

## 2016-07-03 MED ORDER — LEVOTHYROXINE SODIUM 150 MCG PO TABS: ORAL_TABLET | ORAL | 0 refills | Status: DC

## 2016-07-11 ENCOUNTER — Ambulatory Visit (INDEPENDENT_AMBULATORY_CARE_PROVIDER_SITE_OTHER): Payer: Medicare Other | Admitting: Sports Medicine

## 2016-07-11 ENCOUNTER — Encounter: Payer: Self-pay | Admitting: Sports Medicine

## 2016-07-11 DIAGNOSIS — F418 Other specified anxiety disorders: Secondary | ICD-10-CM

## 2016-07-11 DIAGNOSIS — F32A Depression, unspecified: Secondary | ICD-10-CM

## 2016-07-11 DIAGNOSIS — F419 Anxiety disorder, unspecified: Principal | ICD-10-CM

## 2016-07-11 DIAGNOSIS — F329 Major depressive disorder, single episode, unspecified: Secondary | ICD-10-CM

## 2016-07-11 MED ORDER — BUSPIRONE HCL 15 MG PO TABS: 15.0000 mg | ORAL_TABLET | Freq: Two times a day (BID) | ORAL | 1 refills | Status: DC

## 2016-07-11 MED ORDER — HYDROXYZINE HCL 25 MG PO TABS: 25.0000 mg | ORAL_TABLET | Freq: Three times a day (TID) | ORAL | 0 refills | Status: DC | PRN

## 2016-07-11 MED ORDER — DULOXETINE HCL 60 MG PO CPEP: 120.0000 mg | ORAL_CAPSULE | Freq: Every day | ORAL | 1 refills | Status: DC

## 2016-07-11 NOTE — Progress Notes (Signed)
  Subjective:    CC: Follow-up  HPI: This is a pleasant 55 year old female, comes in for follow-up of her anxiety and depression, is currently on 90 mg of Cymbalta and 7.5 mg of buspirone twice a day. She still has persistent symptoms anxiety and depression including moderate depressed mood, difficulty sleeping, poor energy, both overeating and poor appetite, and psychomotor retardation, no suicidal or homicidal ideation, she also has moderate nervousness, anxiety, difficulty controlling her worry, worrying about different things, difficulty relaxing and restlessness.  Past medical history:  Negative.  See flowsheet/record as well for more information.  Surgical history: Negative.  See flowsheet/record as well for more information.  Family history: Negative.  See flowsheet/record as well for more information.  Social history: Negative.  See flowsheet/record as well for more information.  Allergies, and medications have been entered into the medical record, reviewed, and no changes needed.   Review of Systems: No fevers, chills, night sweats, weight loss, chest pain, or shortness of breath.   Objective:    General: Well Developed, well nourished, and in no acute distress.  Neuro: Alert and oriented x3, extra-ocular muscles intact, sensation grossly intact.  HEENT: Normocephalic, atraumatic, pupils equal round reactive to light, neck supple, no masses, no lymphadenopathy, thyroid nonpalpable.  Skin: Warm and dry, no rashes. Cardiac: Regular rate and rhythm, no murmurs rubs or gallops, no lower extremity edema.  Respiratory: Clear to auscultation bilaterally. Not using accessory muscles, speaking in full sentences.  Impression and Recommendations:    Anxiety and depression Increasing Cymbalta to 120 mg, she will just take 2 of her 30 mg pills twice a day until she runs out and refill with 60 mg pills. Increasing buspirone to 15 mg twice a day. Adding hydroxyzine for as needed panic Return  in one month, recheck a PHQ9 and GAD7.

## 2016-07-11 NOTE — Assessment & Plan Note (Signed)
Increasing Cymbalta to 120 mg, she will just take 2 of her 30 mg pills twice a day until she runs out and refill with 60 mg pills. Increasing buspirone to 15 mg twice a day. Adding hydroxyzine for as needed panic Return in one month, recheck a PHQ9 and GAD7.

## 2016-07-15 ENCOUNTER — Telehealth: Payer: Self-pay

## 2016-07-15 MED ORDER — CIPROFLOXACIN HCL 500 MG PO TABS: 500.0000 mg | ORAL_TABLET | Freq: Two times a day (BID) | ORAL | 0 refills | Status: DC

## 2016-07-15 MED ORDER — METRONIDAZOLE 500 MG PO TABS: 500.0000 mg | ORAL_TABLET | Freq: Three times a day (TID) | ORAL | 0 refills | Status: DC

## 2016-07-15 NOTE — Telephone Encounter (Signed)
I do recommend that she return to clinic however will call in antibiotics until she can come in the next day or 2 or 3. She should go to the emergency room if she gets worse.

## 2016-07-15 NOTE — Telephone Encounter (Signed)
Patient advised of recommendations.  

## 2016-07-15 NOTE — Telephone Encounter (Addendum)
Aimee Little called and states she is having another diverticulitis flare up. She is having the same lower left abdominal pain, feeling gassy and cramping. She denies fevers, chills or sweats. She was seen a couple of months ago for the same symptoms. Will she need to be seen? She states she ate cabbage and believes this is what caused the flare up. Please advise.

## 2016-07-18 ENCOUNTER — Ambulatory Visit (INDEPENDENT_AMBULATORY_CARE_PROVIDER_SITE_OTHER): Payer: Medicare Other | Admitting: Family Medicine

## 2016-07-18 DIAGNOSIS — K5732 Diverticulitis of large intestine without perforation or abscess without bleeding: Secondary | ICD-10-CM | POA: Diagnosis not present

## 2016-07-18 NOTE — Patient Instructions (Signed)
Thank you for coming in today. Return as needed.  Continue the antibiotics.  Try using fiber in your diet and avoid seeds and nuts. ]   Diverticulitis Diverticulitis is inflammation or infection of small pouches in your colon that form when you have a condition called diverticulosis. The pouches in your colon are called diverticula. Your colon, or large intestine, is where water is absorbed and stool is formed. Complications of diverticulitis can include:  Bleeding.  Severe infection.  Severe pain.  Perforation of your colon.  Obstruction of your colon. CAUSES  Diverticulitis is caused by bacteria. Diverticulitis happens when stool becomes trapped in diverticula. This allows bacteria to grow in the diverticula, which can lead to inflammation and infection. RISK FACTORS People with diverticulosis are at risk for diverticulitis. Eating a diet that does not include enough fiber from fruits and vegetables may make diverticulitis more likely to develop. SYMPTOMS  Symptoms of diverticulitis may include:  Abdominal pain and tenderness. The pain is normally located on the left side of the abdomen, but may occur in other areas.  Fever and chills.  Bloating.  Cramping.  Nausea.  Vomiting.  Constipation.  Diarrhea.  Blood in your stool. DIAGNOSIS  Your health care provider will ask you about your medical history and do a physical exam. You may need to have tests done because many medical conditions can cause the same symptoms as diverticulitis. Tests may include:  Blood tests.  Urine tests.  Imaging tests of the abdomen, including X-rays and CT scans. When your condition is under control, your health care provider may recommend that you have a colonoscopy. A colonoscopy can show how severe your diverticula are and whether something else is causing your symptoms. TREATMENT  Most cases of diverticulitis are mild and can be treated at home. Treatment may include:  Taking  over-the-counter pain medicines.  Following a clear liquid diet.  Taking antibiotic medicines by mouth for 7-10 days. More severe cases may be treated at a hospital. Treatment may include:  Not eating or drinking.  Taking prescription pain medicine.  Receiving antibiotic medicines through an IV tube.  Receiving fluids and nutrition through an IV tube.  Surgery. HOME CARE INSTRUCTIONS   Follow your health care provider's instructions carefully.  Follow a full liquid diet or other diet as directed by your health care provider. After your symptoms improve, your health care provider may tell you to change your diet. He or she may recommend you eat a high-fiber diet. Fruits and vegetables are good sources of fiber. Fiber makes it easier to pass stool.  Take fiber supplements or probiotics as directed by your health care provider.  Only take medicines as directed by your health care provider.  Keep all your follow-up appointments. SEEK MEDICAL CARE IF:   Your pain does not improve.  You have a hard time eating food.  Your bowel movements do not return to normal. SEEK IMMEDIATE MEDICAL CARE IF:   Your pain becomes worse.  Your symptoms do not get better.  Your symptoms suddenly get worse.  You have a fever.  You have repeated vomiting.  You have bloody or black, tarry stools. MAKE SURE YOU:   Understand these instructions.  Will watch your condition.  Will get help right away if you are not doing well or get worse.   This information is not intended to replace advice given to you by your health care provider. Make sure you discuss any questions you have with your health care  provider.   Document Released: 06/26/2005 Document Revised: 09/21/2013 Document Reviewed: 08/11/2013 Elsevier Interactive Patient Education [employer].

## 2016-07-19 DIAGNOSIS — K5732 Diverticulitis of large intestine without perforation or abscess without bleeding: Secondary | ICD-10-CM | POA: Insufficient documentation

## 2016-07-19 NOTE — Progress Notes (Signed)
Aimee Little is a 55 y.o. female who presents to [place] today for abdominal pain. Patient has a history of recurrent diverticulitis. A few days ago she began experiencing left lower quadrant abdominal pain consistent with previous episodes of diverticulitis. She denies any fevers or chills. She called into the clinic and was prescribed Cipro and Flagyl empirically. She is here today for follow-up. She's feeling much better with resolving abdominal pain at any current fevers or chills. She denies any diarrhea or vomiting or blood in the stool.   Past Medical History:  Diagnosis Date  . DDD (degenerative disc disease), lumbar   . Depression   . Fibromyalgia   . Insomnia   . Thyroid disease    Past Surgical History:  Procedure Laterality Date  . ABDOMINAL HYSTERECTOMY     took uterus and both ovaries left   Social History  Substance Use Topics  . Smoking status: Former Smoker    Packs/day: 0.50    Quit date: 10/02/2013  . Smokeless tobacco: Not on file  . Alcohol use Not on file   family history includes Alcohol abuse in her brother; Depression in her mother; Diabetes in her sister; Heart attack in her father; Hyperlipidemia in her mother; Hypertension in her brother, mother, and sister.  ROS as above:  Medications: Current Outpatient Prescriptions  Medication Sig Dispense Refill  . AMBULATORY NON FORMULARY MEDICATION Compression stockings, please fit to size, pressure 20-53mmHg one pair one refill. Dx Venous Insufficiency 1 Units 1  . baclofen (LIORESAL) 10 MG tablet TAKE ONE TABLET BY MOUTH 3 TIMES A DAY 90 tablet 5  . botulinum toxin Type A (BOTOX) 100 UNITS SOLR injection Inject 100 Units into the muscle.    . busPIRone (BUSPAR) 15 MG tablet Take 1 tablet (15 mg total) by mouth 2 (two) times daily. 60 tablet 1  . ciprofloxacin (CIPRO) 500 MG tablet  Take 1 tablet (500 mg total) by mouth 2 (two) times daily. 14 tablet 0  . dexlansoprazole (DEXILANT) 60 MG capsule Take 1 capsule (60 mg total) by mouth daily. 30 capsule 6  . DULoxetine (CYMBALTA) 60 MG capsule Take 2 capsules (120 mg total) by mouth daily. 60 capsule 1  . estradiol (ESTRACE VAGINAL) 0.1 MG/GM vaginal cream Place 1 Applicatorful vaginally at bedtime. For 2 weeks. Then taper dose to 1-3 times a week. 42.5 g 5  . fluticasone (FLONASE) 50 MCG/ACT nasal spray USE 1 TO 2 SPRAYS IN EACH NOSTRIL ONCE DAILY. 16 g 6  . gabapentin (NEURONTIN) 300 MG capsule TAKE 2 CAPSULES BY MOUTH 3 TIMES A DAY AS NEEDED FOR PAIN/NEUROPATHY 180 capsule 4  . HYDROcodone-acetaminophen (NORCO/VICODIN) 5-325 MG tablet Take 1 tablet by mouth every 6 (six) hours as needed. 15 tablet 0  . hydrOXYzine (ATARAX/VISTARIL) 25 MG tablet Take 1 tablet (25 mg total) by mouth 3 (three) times daily as needed for anxiety. 30 tablet 0  . ipratropium (ATROVENT) 0.06 % nasal spray Place 2 sprays into both nostrils 4 (four) times daily. 15 mL 1  . levothyroxine (SYNTHROID, LEVOTHROID) 150 MCG tablet TAKE ONE TABLET BY MOUTH EVERY DAY BEFORE BREAKFAST 90 tablet 0  . meloxicam (MOBIC) 15 MG tablet Take 1 tablet (15 mg total) by mouth daily. 30 tablet 2  . metoCLOPramide (REGLAN) 10 MG tablet Take 0.5-1 tablets (5-10 mg total) by mouth every 8 (eight) hours as needed for nausea (nausea/headache). 90 tablet 2  . metroNIDAZOLE (  FLAGYL) 500 MG tablet Take 1 tablet (500 mg total) by mouth 3 (three) times daily. 21 tablet 0  . Omega 3-6-9 Fatty Acids (OMEGA 3-6-9 COMPLEX PO) Take by mouth.    . ranitidine (ZANTAC) 300 MG tablet TAKE ONE TABLET BY MOUTH 2 TIMES A DAY 180 tablet 0  . rizatriptan (MAXALT-MLT) 10 MG disintegrating tablet TAKE ONE TABLET BY MOUTH AS NEEDED. MAY REPEAT IN 2 HOURS IF NEEDED. 10 tablet 1  . rOPINIRole (REQUIP) 1 MG tablet Take 1 tablet (1 mg total) by mouth 3 (three) times daily. 60 tablet 1  . topiramate  (TOPAMAX) 100 MG tablet Take 1 tablet (100 mg total) by mouth 2 (two) times daily. 60 tablet 5  . traMADol (ULTRAM) 50 MG tablet TAKE ONE TABLET EVERY 4 TO 6 HOURS AS NEEDED FOR PAIN 60 tablet 1  . valACYclovir (VALTREX) 1000 MG tablet TAKE TWO TABLETS 2 TIMES A DAY AT ONSET OF COLD SORE FOR 2 DAYS 60 tablet 0  . Vitamin D, Ergocalciferol, (DRISDOL) 50000 units CAPS capsule Take 1 capsule (50,000 Units total) by mouth every 7 (seven) days. 12 capsule 4  . VITAMIN E PO Take by mouth.    . zolpidem (AMBIEN) 5 MG tablet Take 1 tablet (5 mg total) by mouth at bedtime as needed. for sleep 30 tablet 2   No current facility-administered medications for this visit.    Allergies  Allergen Reactions  . Doxepin Other (See Comments)  . Levothyroxine     Other reaction(s): Other (See Comments) Reaction unknown  . Mirapex [Pramipexole Dihydrochloride]     Nausea and vomiting  . Phentermine     Stomach ache/increased anxiety.   . Pregabalin   . Gabapentin     Sleep walking    Health Maintenance Health Maintenance  Topic Date Due  . Hepatitis C Screening  09-17-1961  . HIV Screening  12/04/1974  . MAMMOGRAM  11/09/2016  . COLONOSCOPY  12/20/2016  . TETANUS/TDAP  01/02/2023  . INFLUENZA VACCINE  Addressed     Exam:  BP 137/85   Pulse 88   Temp 98.3 F (36.8 C) (Oral)   Wt 221 lb (100.2 kg)   BMI 31.71 kg/m  Gen: Well NAD Nontoxic appearing HEENT: EOMI,  MMM Lungs: Normal work of breathing. CTABL Heart: RRR no MRG Abd: NABS, Soft. Nondistended, minimally tender left lower quadrant with no rebound or guarding. Exts: Brisk capillary refill, warm and well perfused.    No results found for this or any previous visit (from the past 72 hour(s)). No results found.    Assessment and Plan: 55 y.o. female with resolving diverticulitis. Discussed diverticular diet. Recommend fiber or stool softeners and avoiding nuts or seeds. Finish out antibiotic course. Follow-up with PCP in the near  future.   No orders of the defined types were placed in this encounter.   Discussed warning signs or symptoms. Please see discharge instructions. Patient expresses understanding.

## 2016-07-31 ENCOUNTER — Ambulatory Visit (INDEPENDENT_AMBULATORY_CARE_PROVIDER_SITE_OTHER): Payer: Medicare Other | Admitting: Physician Assistant

## 2016-07-31 ENCOUNTER — Encounter: Payer: Self-pay | Admitting: Physician Assistant

## 2016-07-31 DIAGNOSIS — F32A Depression, unspecified: Secondary | ICD-10-CM

## 2016-07-31 DIAGNOSIS — F418 Other specified anxiety disorders: Secondary | ICD-10-CM | POA: Diagnosis not present

## 2016-07-31 DIAGNOSIS — F419 Anxiety disorder, unspecified: Principal | ICD-10-CM

## 2016-07-31 DIAGNOSIS — F329 Major depressive disorder, single episode, unspecified: Secondary | ICD-10-CM

## 2016-07-31 MED ORDER — DULOXETINE HCL 30 MG PO CPEP: 30.0000 mg | ORAL_CAPSULE | Freq: Two times a day (BID) | ORAL | 2 refills | Status: DC

## 2016-07-31 MED ORDER — HYDROXYZINE HCL 25 MG PO TABS: 25.0000 mg | ORAL_TABLET | Freq: Three times a day (TID) | ORAL | 1 refills | Status: DC | PRN

## 2016-07-31 MED ORDER — BUPROPION HCL ER (XL) 150 MG PO TB24: 150.0000 mg | ORAL_TABLET | Freq: Every day | ORAL | 0 refills | Status: DC

## 2016-07-31 NOTE — Progress Notes (Signed)
   Subjective:    Patient ID: Aimee Little, female    DOB: 10/30/1960, 55 y.o.   MRN: [id number]  HPI  Pt is a 55 yo female with a long PMH of anxiety and depression. She would like to review medications. She was seen by Dr. Darene Lamer and he uped her cymbalta to 120mg . Unclear of what happened but recorded that she was taking 90mg  daily but pt was only taking 60mg . That is a big jump and patient had lots of increased anxiety and panic with the increase and patient tpatered back down to watch she was on before of 60mg . She does think the hydroxine did well to use as needed. Doing well on buspar. Overall pt doing ok. She request to try wellbutrin again. She states she felt really good on it.     Review of Systems  All other systems reviewed and are negative.      Objective:   Physical Exam  Constitutional: She is oriented to person, place, and time. She appears well-developed and well-nourished.  HENT:  Head: Normocephalic and atraumatic.  Cardiovascular: Normal rate, regular rhythm and normal heart sounds.   Pulmonary/Chest: Effort normal and breath sounds normal.  Neurological: She is alert and oriented to person, place, and time.  Psychiatric: She has a normal mood and affect. Her behavior is normal.          Assessment & Plan:  Marland KitchenMarland KitchenMelissa was seen today for depression and anxiety.  Diagnoses and all orders for this visit:  Anxiety and depression -     hydrOXYzine (ATARAX/VISTARIL) 25 MG tablet; Take 1 tablet (25 mg total) by mouth 3 (three) times daily as needed for anxiety. -     buPROPion (WELLBUTRIN XL) 150 MG 24 hr tablet; Take 1 tablet (150 mg total) by mouth daily. -     DULoxetine (CYMBALTA) 30 MG capsule; Take 1 capsule (30 mg total) by mouth 2 (two) times daily.   Discussed wellbutrin known to increase anxiety as well. Let me know how the augmentation with cymbalta is going in next few weeks. Stay on buspar and using hydroxyzine as needed for panic and anxiety.

## 2016-08-01 ENCOUNTER — Encounter: Payer: Self-pay | Admitting: Physician Assistant

## 2016-08-09 IMAGING — CR DG CHEST 2V
2 series · 2 of 2 positions shown · non-contrast
Comparison: Chest x-ray 06/08/2014.

CLINICAL DATA: 54-year-old female with shortness of breath and
chest pressure.

EXAM:
CHEST  2 VIEW

[chest pa]
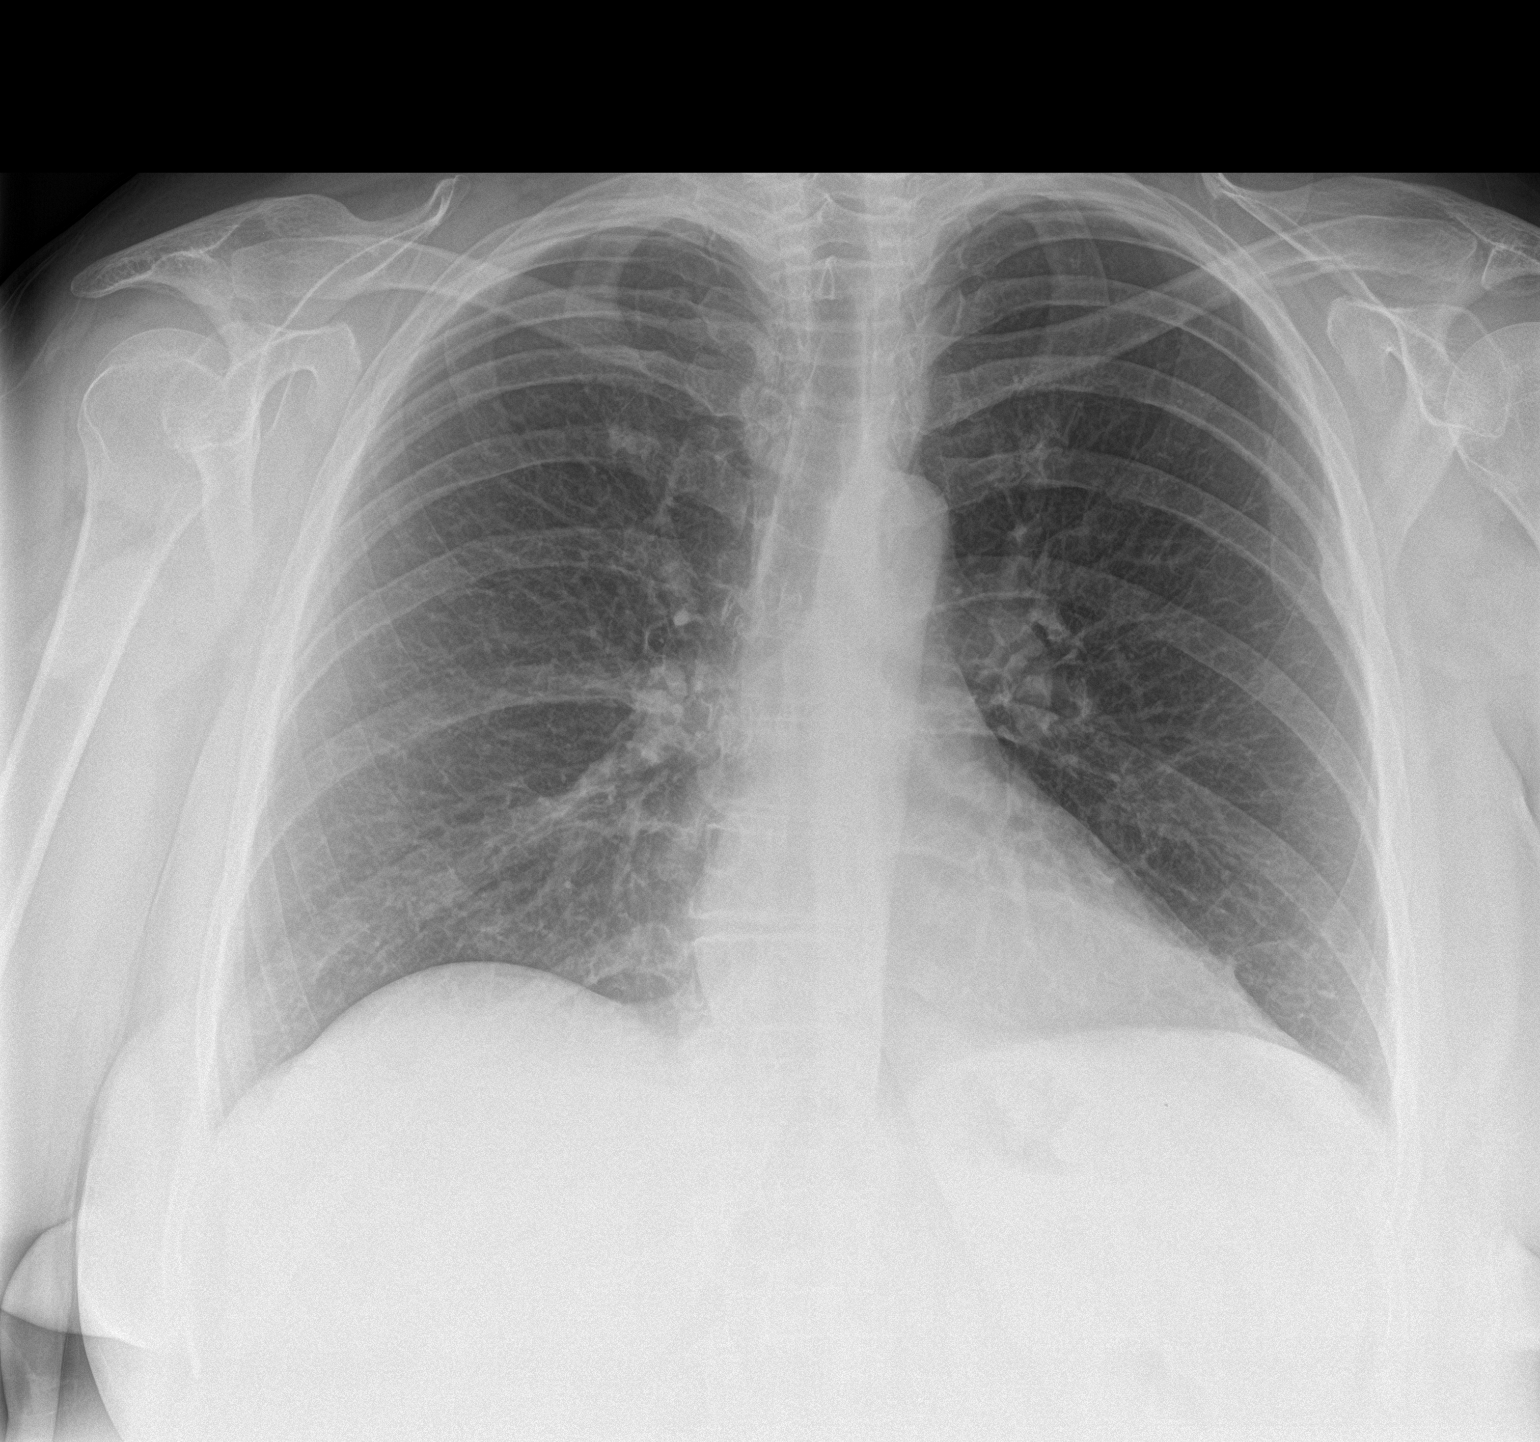

[chest lat]
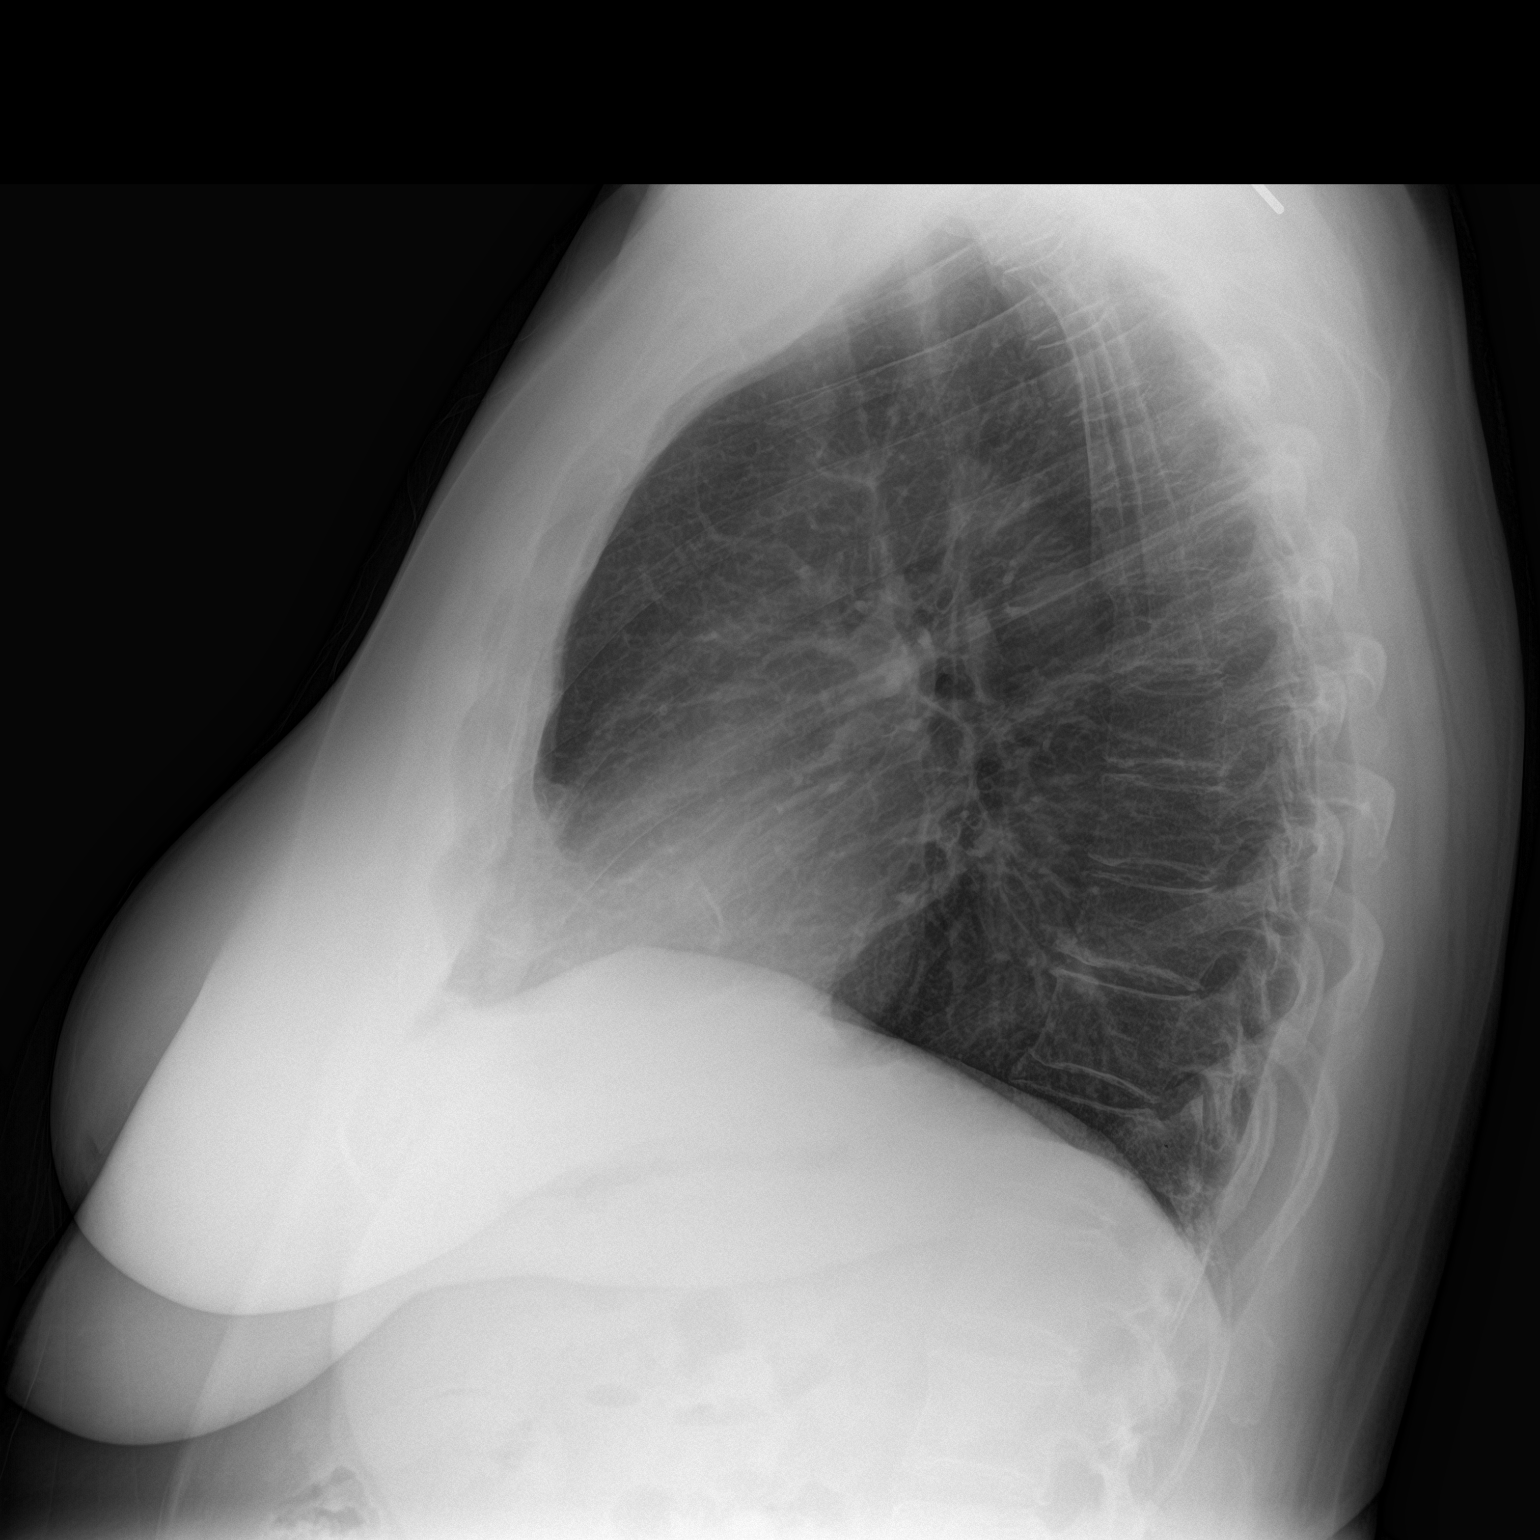

[2 of 2 positions shown; findings below may reference images not displayed]

FINDINGS: Small amount of scarring in the lingula is unchanged. Lung volumes
are normal. No consolidative airspace disease. No pleural effusions.
No pneumothorax. No pulmonary nodule or mass noted. Pulmonary
vasculature and the cardiomediastinal silhouette are within normal
limits.
IMPRESSION: No radiographic evidence of acute cardiopulmonary disease.

## 2016-08-19 ENCOUNTER — Other Ambulatory Visit: Payer: Self-pay | Admitting: Family Medicine

## 2016-08-20 ENCOUNTER — Encounter: Payer: Self-pay | Admitting: Sports Medicine

## 2016-08-20 ENCOUNTER — Other Ambulatory Visit: Payer: Self-pay | Admitting: Physician Assistant

## 2016-08-20 ENCOUNTER — Ambulatory Visit (INDEPENDENT_AMBULATORY_CARE_PROVIDER_SITE_OTHER): Payer: Medicare Other | Admitting: Sports Medicine

## 2016-08-20 DIAGNOSIS — J309 Allergic rhinitis, unspecified: Secondary | ICD-10-CM

## 2016-08-20 DIAGNOSIS — G894 Chronic pain syndrome: Secondary | ICD-10-CM

## 2016-08-20 MED ORDER — AMOXICILLIN-POT CLAVULANATE 875-125 MG PO TABS: 1.0000 | ORAL_TABLET | Freq: Two times a day (BID) | ORAL | 0 refills | Status: DC

## 2016-08-20 NOTE — Assessment & Plan Note (Signed)
Early maxillary sinusitis, continue Flonase would increase to twice a day, discontinue nasal decongestants. Symptoms have only been present for 4 days, I am going to write a prescription for Augmentin, she will only take this if symptoms are present for greater than an additional week.

## 2016-08-20 NOTE — Progress Notes (Signed)
  Subjective:    CC: Frontal headache  HPI: For 4 days this pleasant 55 year old female with a history of sinus infections has had increasing frontal headache, without radiation, moderate, persistent. Nasal discharge, low-grade fevers, no cough, shortness of breath, chest pain, rash, or GI symptoms.  Past medical history:  Negative.  See flowsheet/record as well for more information.  Surgical history: Negative.  See flowsheet/record as well for more information.  Family history: Negative.  See flowsheet/record as well for more information.  Social history: Negative.  See flowsheet/record as well for more information.  Allergies, and medications have been entered into the medical record, reviewed, and no changes needed.   Review of Systems: No fevers, chills, night sweats, weight loss, chest pain, or shortness of breath.   Objective:    General: Well Developed, well nourished, and in no acute distress.  Neuro: Alert and oriented x3, extra-ocular muscles intact, sensation grossly intact.  HEENT: Normocephalic, atraumatic, pupils equal round reactive to light, neck supple, no masses, no lymphadenopathy, thyroid nonpalpable. Oropharynx, nasopharynx, ear canals unremarkable. Skin: Warm and dry, no rashes. Cardiac: Regular rate and rhythm, no murmurs rubs or gallops, no lower extremity edema.  Respiratory: Clear to auscultation bilaterally. Not using accessory muscles, speaking in full sentences.  Impression and Recommendations:    Rhinitis, allergic Early maxillary sinusitis, continue Flonase would increase to twice a day, discontinue nasal decongestants. Symptoms have only been present for 4 days, I am going to write a prescription for Augmentin, she will only take this if symptoms are present for greater than an additional week.

## 2016-08-28 ENCOUNTER — Other Ambulatory Visit: Payer: Self-pay | Admitting: Physician Assistant

## 2016-09-25 ENCOUNTER — Other Ambulatory Visit: Payer: Self-pay | Admitting: Physician Assistant

## 2016-11-01 ENCOUNTER — Other Ambulatory Visit: Payer: Self-pay | Admitting: Physician Assistant

## 2016-11-29 ENCOUNTER — Other Ambulatory Visit: Payer: Self-pay | Admitting: Physician Assistant

## 2016-11-29 DIAGNOSIS — E559 Vitamin D deficiency, unspecified: Secondary | ICD-10-CM

## 2016-11-29 NOTE — Telephone Encounter (Signed)
Pt is going to make an appointment for a physical.  I have ordered a Vit D level, she may wait until after her physical in case she needs other Blood work done.

## 2016-11-29 NOTE — Telephone Encounter (Signed)
Ok to order vitamin D level and then treat accordingly.

## 2016-11-29 NOTE — Telephone Encounter (Signed)
Pharmacy is requesting vit D refill.  From what I see in the chart her Vit D has not been checked since 2016.  Please advise.

## 2016-11-29 NOTE — Telephone Encounter (Signed)
That's fine

## 2016-12-04 ENCOUNTER — Ambulatory Visit (INDEPENDENT_AMBULATORY_CARE_PROVIDER_SITE_OTHER): Payer: Medicare Other | Admitting: Physician Assistant

## 2016-12-04 ENCOUNTER — Encounter: Payer: Self-pay | Admitting: Physician Assistant

## 2016-12-04 VITALS — BP 142/78 | HR 87 | Ht 70.0 in | Wt 218.0 lb

## 2016-12-04 DIAGNOSIS — M797 Fibromyalgia: Secondary | ICD-10-CM | POA: Diagnosis not present

## 2016-12-04 DIAGNOSIS — M545 Low back pain, unspecified: Secondary | ICD-10-CM

## 2016-12-04 DIAGNOSIS — F32A Depression, unspecified: Secondary | ICD-10-CM

## 2016-12-04 DIAGNOSIS — Z0000 Encounter for general adult medical examination without abnormal findings: Secondary | ICD-10-CM | POA: Diagnosis not present

## 2016-12-04 DIAGNOSIS — F418 Other specified anxiety disorders: Secondary | ICD-10-CM

## 2016-12-04 DIAGNOSIS — F329 Major depressive disorder, single episode, unspecified: Secondary | ICD-10-CM

## 2016-12-04 DIAGNOSIS — M62838 Other muscle spasm: Secondary | ICD-10-CM

## 2016-12-04 DIAGNOSIS — F419 Anxiety disorder, unspecified: Secondary | ICD-10-CM

## 2016-12-04 DIAGNOSIS — G8929 Other chronic pain: Secondary | ICD-10-CM

## 2016-12-04 MED ORDER — HYDROXYZINE HCL 25 MG PO TABS: 25.0000 mg | ORAL_TABLET | Freq: Three times a day (TID) | ORAL | 5 refills | Status: DC | PRN

## 2016-12-04 MED ORDER — BUSPIRONE HCL 15 MG PO TABS: 15.0000 mg | ORAL_TABLET | Freq: Two times a day (BID) | ORAL | 5 refills | Status: DC

## 2016-12-04 MED ORDER — GABAPENTIN 300 MG PO CAPS: ORAL_CAPSULE | ORAL | 5 refills | Status: DC

## 2016-12-04 MED ORDER — DULOXETINE HCL 30 MG PO CPEP: 30.0000 mg | ORAL_CAPSULE | Freq: Two times a day (BID) | ORAL | 5 refills | Status: DC

## 2016-12-04 MED ORDER — BUPROPION HCL ER (XL) 150 MG PO TB24: 150.0000 mg | ORAL_TABLET | Freq: Every day | ORAL | 5 refills | Status: DC

## 2016-12-04 MED ORDER — ROPINIROLE HCL 1 MG PO TABS
ORAL_TABLET | ORAL | 5 refills | Status: DC
Start: 2016-12-04 — End: 2018-02-10

## 2016-12-04 MED ORDER — TOPIRAMATE 100 MG PO TABS: ORAL_TABLET | ORAL | 5 refills | Status: DC

## 2016-12-04 MED ORDER — KETOROLAC TROMETHAMINE 60 MG/2ML IM SOLN
60.0000 mg | Freq: Once | INTRAMUSCULAR | Status: AC
  Administered 2016-12-04: 60 mg via INTRAMUSCULAR

## 2016-12-04 MED ORDER — BACLOFEN 10 MG PO TABS: ORAL_TABLET | ORAL | 5 refills | Status: DC

## 2016-12-04 MED ORDER — DEXLANSOPRAZOLE 60 MG PO CPDR: 1.0000 | DELAYED_RELEASE_CAPSULE | Freq: Every day | ORAL | 6 refills | Status: DC

## 2016-12-04 MED ORDER — VALACYCLOVIR HCL 1 G PO TABS: ORAL_TABLET | ORAL | 0 refills | Status: DC

## 2016-12-04 MED ORDER — ZOLPIDEM TARTRATE 5 MG PO TABS: ORAL_TABLET | ORAL | 5 refills | Status: DC

## 2016-12-04 NOTE — Patient Instructions (Signed)

## 2016-12-05 ENCOUNTER — Telehealth: Payer: Self-pay | Admitting: *Deleted

## 2016-12-05 NOTE — Telephone Encounter (Signed)
[place] for tramadol, unless wants to wait until PCP is back tomorrow.

## 2016-12-05 NOTE — Telephone Encounter (Signed)
Pt was here this week for a CPE but her back & hip pain is flaring so she received Toradol 60 in the office.  She calls today requesting a small amount of hydrocodone to help her.  She states that if she is no better by Monday then she will make another appt.  Please advise.

## 2016-12-05 NOTE — Telephone Encounter (Signed)
Pt prefers to wait until tomorrow.

## 2016-12-06 ENCOUNTER — Other Ambulatory Visit: Payer: Self-pay | Admitting: *Deleted

## 2016-12-06 ENCOUNTER — Encounter: Payer: Self-pay | Admitting: Physician Assistant

## 2016-12-06 DIAGNOSIS — Z1329 Encounter for screening for other suspected endocrine disorder: Secondary | ICD-10-CM | POA: Diagnosis not present

## 2016-12-06 DIAGNOSIS — Z0000 Encounter for general adult medical examination without abnormal findings: Secondary | ICD-10-CM | POA: Diagnosis not present

## 2016-12-06 DIAGNOSIS — Z1321 Encounter for screening for nutritional disorder: Secondary | ICD-10-CM | POA: Diagnosis not present

## 2016-12-06 DIAGNOSIS — Z13220 Encounter for screening for lipoid disorders: Secondary | ICD-10-CM | POA: Diagnosis not present

## 2016-12-06 LAB — COMPLETE METABOLIC PANEL WITH GFR
ALBUMIN: 3.9 g/dL (ref 3.6–5.1)
ALK PHOS: 78 U/L (ref 33–130)
ALT: 12 U/L (ref 6–29)
AST: 11 U/L (ref 10–35)
BILIRUBIN TOTAL: 0.4 mg/dL (ref 0.2–1.2)
BUN: 22 mg/dL (ref 7–25)
CO2: 25 mmol/L (ref 20–31)
CREATININE: 0.89 mg/dL (ref 0.50–1.05)
Calcium: 8.7 mg/dL (ref 8.6–10.4)
Chloride: 112 mmol/L — ABNORMAL HIGH (ref 98–110)
GFR, EST NON AFRICAN AMERICAN: 72 mL/min (ref >=60)
GFR, Est African American: 83 mL/min (ref >=60)
Glucose, Bld: 107 mg/dL — ABNORMAL HIGH (ref 65–99)
Potassium: 4 mmol/L (ref 3.5–5.3)
SODIUM: 143 mmol/L (ref 135–146)
TOTAL PROTEIN: 6.3 g/dL (ref 6.1–8.1)

## 2016-12-06 LAB — LIPID PANEL
Cholesterol: 162 mg/dL (ref <200)
HDL: 48 mg/dL — ABNORMAL LOW (ref >50)
LDL CALC: 96 mg/dL (ref <100)
Total CHOL/HDL Ratio: 3.4 Ratio (ref <5.0)
Triglycerides: 88 mg/dL (ref <150)
VLDL: 18 mg/dL (ref <30)

## 2016-12-06 LAB — TSH: TSH: 1.05 mIU/L

## 2016-12-06 MED ORDER — HYDROCODONE-ACETAMINOPHEN 5-325 MG PO TABS: 1.0000 | ORAL_TABLET | Freq: Four times a day (QID) | ORAL | 0 refills | Status: DC | PRN

## 2016-12-06 NOTE — Progress Notes (Signed)
Subjective:     Aimee Little is a 56 y.o. female and is here for a comprehensive physical exam. The patient reports problems - needs refills on medication. she just finished with a move to another apartment. her lower back is causing her some pain. she has a hx of low back SI joint pain. no radiation into legs, bowel or bladder dysfunction, or saddle anesthesia. .  Social History   Social History  . Marital status: Married    Spouse name: N/A  . Number of children: N/A  . Years of education: N/A   Occupational History  . Not on file.   Social History Main Topics  . Smoking status: Former Smoker    Packs/day: 0.50    Quit date: 10/02/2013  . Smokeless tobacco: Never Used  . Alcohol use Not on file  . Drug use: Unknown  . Sexual activity: Not on file   Other Topics Concern  . Not on file   Social History Narrative  . No narrative on file   Health Maintenance  Topic Date Due  . Hepatitis C Screening  [birth year]1960  . HIV Screening  12/04/1974  . MAMMOGRAM  11/09/2016  . COLONOSCOPY  12/20/2016  . TETANUS/TDAP  01/02/2023  . INFLUENZA VACCINE  Addressed    The following portions of the patient's history were reviewed and updated as appropriate: allergies, current medications, past family history, past medical history, past social history, past surgical history and problem list.  Review of Systems Pertinent items noted in HPI and remainder of comprehensive ROS otherwise negative.   Objective:    BP (!) 142/78   Pulse 87   Ht 5\' 10"  (1.778 m)   Wt 218 lb (98.9 kg)   BMI 31.28 kg/m  General appearance: alert, cooperative and appears stated age Head: Normocephalic, without obvious abnormality, atraumatic Eyes: conjunctivae/corneas clear. PERRL, EOM's intact. Fundi benign. Ears: normal TM's and external ear canals both ears Nose: Nares normal. Septum midline. Mucosa normal. No drainage or sinus tenderness. Throat: lips, mucosa, and tongue normal; teeth and gums  normal Neck: no adenopathy, no carotid bruit, no JVD, supple, symmetrical, trachea midline and thyroid not enlarged, symmetric, no tenderness/mass/nodules Back: symmetric, no curvature. ROM normal. No CVA tenderness. Lungs: clear to auscultation bilaterally Heart: regular rate and rhythm, S1, S2 normal, no murmur, click, rub or gallop Abdomen: soft, non-tender; bowel sounds normal; no masses,  no organomegaly Extremities: extremities normal, atraumatic, no cyanosis or edema Pulses: 2+ and symmetric Skin: Skin color, texture, turgor normal. No rashes or lesions Lymph nodes: Cervical, supraclavicular, and axillary nodes normal. Neurologic: Alert and oriented X 3, normal strength and tone. Normal symmetric reflexes. Normal coordination and gait    Assessment:    Healthy female exam.      Plan:    Marland KitchenMarland KitchenMelissa was seen today for annual exam and back pain.  Diagnoses and all orders for this visit:  Routine physical examination -     COMPLETE METABOLIC PANEL WITH GFR -     Lipid panel -     TSH -     VITAMIN D 25 Hydroxy (Vit-D Deficiency, Fractures)  Chronic bilateral low back pain without sciatica -     ketorolac (TORADOL) injection 60 mg; Inject 2 mLs (60 mg total) into the muscle once.  Fibromyalgia  Muscle spasm -     baclofen (LIORESAL) 10 MG tablet; TAKE ONE TABLET BY MOUTH 3 TIMES A DAY  Anxiety and depression -     buPROPion [facility]  XL) 150 MG 24 hr tablet; Take 1 tablet (150 mg total) by mouth daily. -     busPIRone (BUSPAR) 15 MG tablet; Take 1 tablet (15 mg total) by mouth 2 (two) times daily. -     DULoxetine (CYMBALTA) 30 MG capsule; Take 1 capsule (30 mg total) by mouth 2 (two) times daily. -     hydrOXYzine (ATARAX/VISTARIL) 25 MG tablet; Take 1 tablet (25 mg total) by mouth 3 (three) times daily as needed for anxiety.  Other orders -     dexlansoprazole (DEXILANT) 60 MG capsule; Take 1 capsule (60 mg total) by mouth daily. -     gabapentin (NEURONTIN) 300 MG  capsule; TAKE 2 CAPSULES BY MOUTH 3 TIMES A DAY AS NEEDED FOR PAIN/NEUROPATHY -     rOPINIRole (REQUIP) 1 MG tablet; TAKE ONE TABLET BY MOUTH 3 TIMES A DAY -     topiramate (TOPAMAX) 100 MG tablet; TAKE ONE TABLET BY MOUTH 2 TIMES A DAY -     valACYclovir (VALTREX) 1000 MG tablet; TAKE TWO TABLETS 2 TIMES A DAY AT ONSET OF COLD SORE FOR 2 DAYS -     zolpidem (AMBIEN) 5 MG tablet; Take one to two tablets at bedtime for sleep   Toradol shot given in office today. Discussed adding back in low back stretches. Encouraged heat/ice/biofreeze.   See After Visit Summary for Counseling Recommendations

## 2016-12-06 NOTE — Telephone Encounter (Signed)
Pt notified of rx. 

## 2016-12-06 NOTE — Telephone Encounter (Signed)
Ok for #15 as needed for break through pain.

## 2016-12-07 LAB — VITAMIN D 25 HYDROXY (VIT D DEFICIENCY, FRACTURES): Vit D, 25-Hydroxy: 27 ng/mL — ABNORMAL LOW (ref 30–100)

## 2016-12-09 ENCOUNTER — Ambulatory Visit (INDEPENDENT_AMBULATORY_CARE_PROVIDER_SITE_OTHER): Payer: Medicare Other | Admitting: Physician Assistant

## 2016-12-09 ENCOUNTER — Other Ambulatory Visit: Payer: Self-pay | Admitting: *Deleted

## 2016-12-09 ENCOUNTER — Encounter: Payer: Self-pay | Admitting: Physician Assistant

## 2016-12-09 VITALS — BP 147/77 | HR 91 | Ht 70.0 in

## 2016-12-09 DIAGNOSIS — R1032 Left lower quadrant pain: Secondary | ICD-10-CM

## 2016-12-09 DIAGNOSIS — K5732 Diverticulitis of large intestine without perforation or abscess without bleeding: Secondary | ICD-10-CM

## 2016-12-09 MED ORDER — VITAMIN D (ERGOCALCIFEROL) 1.25 MG (50000 UNIT) PO CAPS: 50000.0000 [IU] | ORAL_CAPSULE | ORAL | 4 refills | Status: DC

## 2016-12-09 MED ORDER — LEVOTHYROXINE SODIUM 150 MCG PO TABS: ORAL_TABLET | ORAL | 3 refills | Status: DC

## 2016-12-09 MED ORDER — CIPROFLOXACIN HCL 500 MG PO TABS: 500.0000 mg | ORAL_TABLET | Freq: Two times a day (BID) | ORAL | 0 refills | Status: DC

## 2016-12-09 MED ORDER — METRONIDAZOLE 500 MG PO TABS: 500.0000 mg | ORAL_TABLET | Freq: Three times a day (TID) | ORAL | 0 refills | Status: AC

## 2016-12-09 NOTE — Progress Notes (Signed)
   Subjective:    Patient ID: Aimee Little, female    DOB: August 26, 1961, 56 y.o.   MRN: [id number]  HPI Pt is a 56 yo female who presents to the clinic with left lower quadrant pain for 3 days. She has history of diverticulitis.  She ate nuts 4 days ago, 1 whole can. Pain started within 24 hours of eating the nuts. She denies any fever, chills, body aches. Pain is persisent in lower abdomen. No bowel changes with diarrhea or constipation. Denies any melena or hematochezia.    Review of Systems  All other systems reviewed and are negative.      Objective:   Physical Exam  Constitutional: She is oriented to person, place, and time. She appears well-developed and well-nourished.  HENT:  Head: Normocephalic and atraumatic.  Cardiovascular: Normal rate, regular rhythm and normal heart sounds.   Pulmonary/Chest: Effort normal and breath sounds normal. She has no wheezes.  No CVA tenderness.   Abdominal: Soft.  Moderate tenderness over left lower quadrant to palpation. No rebound or guarding.   Neurological: She is alert and oriented to person, place, and time.  Psychiatric: She has a normal mood and affect. Her behavior is normal.          Assessment & Plan:  Marland KitchenMarland KitchenDiagnoses and all orders for this visit:  Diverticulitis of colon without hemorrhage -     ciprofloxacin (CIPRO) 500 MG tablet; Take 1 tablet (500 mg total) by mouth 2 (two) times daily. For 10 days. -     metroNIDAZOLE (FLAGYL) 500 MG tablet; Take 1 tablet (500 mg total) by mouth 3 (three) times daily. For 10 days.  Left lower quadrant pain -     ciprofloxacin (CIPRO) 500 MG tablet; Take 1 tablet (500 mg total) by mouth 2 (two) times daily. For 10 days. -     metroNIDAZOLE (FLAGYL) 500 MG tablet; Take 1 tablet (500 mg total) by mouth 3 (three) times daily. For 10 days.  Other orders -     Vitamin D, Ergocalciferol, (DRISDOL) 50000 units CAPS capsule; Take 1 capsule (50,000 Units total) by mouth every 7 (seven)  days.   Due to hx and symptoms will treat with cipro and flagyl for 10 days. Call if not improving. No red flag symptoms. Handout given on foods to avoid as triggers.

## 2016-12-09 NOTE — Patient Instructions (Signed)

## 2016-12-16 ENCOUNTER — Other Ambulatory Visit: Payer: Self-pay | Admitting: *Deleted

## 2016-12-16 DIAGNOSIS — B9689 Other specified bacterial agents as the cause of diseases classified elsewhere: Secondary | ICD-10-CM

## 2016-12-16 DIAGNOSIS — J329 Chronic sinusitis, unspecified: Secondary | ICD-10-CM

## 2016-12-16 MED ORDER — FLUCONAZOLE 150 MG PO TABS: ORAL_TABLET | ORAL | 0 refills | Status: AC

## 2017-01-01 ENCOUNTER — Other Ambulatory Visit: Payer: Self-pay | Admitting: Sports Medicine

## 2017-01-10 ENCOUNTER — Other Ambulatory Visit: Payer: Self-pay | Admitting: Physician Assistant

## 2017-01-10 DIAGNOSIS — Z1231 Encounter for screening mammogram for malignant neoplasm of breast: Secondary | ICD-10-CM

## 2017-01-13 ENCOUNTER — Other Ambulatory Visit: Payer: Self-pay | Admitting: Physician Assistant

## 2017-01-20 ENCOUNTER — Ambulatory Visit: Payer: Medicare Other | Admitting: Sports Medicine

## 2017-01-22 ENCOUNTER — Other Ambulatory Visit: Payer: Self-pay | Admitting: Physician Assistant

## 2017-01-27 ENCOUNTER — Ambulatory Visit (INDEPENDENT_AMBULATORY_CARE_PROVIDER_SITE_OTHER): Payer: Medicare Other | Admitting: Physician Assistant

## 2017-01-27 ENCOUNTER — Encounter: Payer: Self-pay | Admitting: Physician Assistant

## 2017-01-27 VITALS — BP 166/85 | HR 86 | Ht 70.0 in

## 2017-01-27 DIAGNOSIS — M545 Low back pain, unspecified: Secondary | ICD-10-CM

## 2017-01-27 DIAGNOSIS — M797 Fibromyalgia: Secondary | ICD-10-CM | POA: Diagnosis not present

## 2017-01-27 DIAGNOSIS — F410 Panic disorder [episodic paroxysmal anxiety] without agoraphobia: Secondary | ICD-10-CM | POA: Diagnosis not present

## 2017-01-27 DIAGNOSIS — G894 Chronic pain syndrome: Secondary | ICD-10-CM | POA: Diagnosis not present

## 2017-01-27 MED ORDER — KETOROLAC TROMETHAMINE 60 MG/2ML IM SOLN
60.0000 mg | Freq: Once | INTRAMUSCULAR | Status: AC
  Administered 2017-01-27: 60 mg via INTRAMUSCULAR

## 2017-01-27 MED ORDER — TRAMADOL HCL 50 MG PO TABS: ORAL_TABLET | ORAL | 1 refills | Status: DC

## 2017-01-27 MED ORDER — DIAZEPAM 2 MG PO TABS: 2.0000 mg | ORAL_TABLET | Freq: Two times a day (BID) | ORAL | 0 refills | Status: DC | PRN

## 2017-01-27 NOTE — Progress Notes (Signed)
   Subjective:    Patient ID: Quincy Simmonds Dansby, female    DOB: 05/18/1961, 56 y.o.   MRN: [id number]  HPI  Pt is a 56 yo female who presents to the clinic because "she hurts everywhere". She is having a chronic pain/fibromyalgia flare. She states her fingers hurt, behind the knee hurts, her skin hurts. Toradol usually helps her. She is scheduled to get a shot in the left knee for OA on Wednesday.   She is taking cymbalta but only 60mg . Per patient she has not been able to tolerate 120mg  dose. She is out of tramadol but does like to keep it on hand for prn usage. She continues to take gabapentin 3 times a day.   She is going to the beach with her son who just recently started talking to her again. She is very nervous about this and she is on edge almost to the point she could vomit. She has had more painic attacks recently with "pending doom", SOB, flushing, extreme anxiety. She has been on valium in the past and wants to know if she can have a month supply.    Review of Systems See HPI.     Objective:   Physical Exam  Constitutional: She is oriented to person, place, and time. She appears well-developed and well-nourished.  HENT:  Head: Normocephalic and atraumatic.  Cardiovascular: Normal rate, regular rhythm and normal heart sounds.   Pulmonary/Chest: Effort normal and breath sounds normal.  Musculoskeletal:  Tenderness to palpation throughout the body.   Neurological: She is alert and oriented to person, place, and time.  Psychiatric: She has a normal mood and affect. Her behavior is normal.          Assessment & Plan:  Marland KitchenMarland KitchenMelissa was seen today for fibromyalgia.  Diagnoses and all orders for this visit:  Fibromyalgia  Chronic pain syndrome -     Discontinue: traMADol (ULTRAM) 50 MG tablet; TAKE 1 TABLET BY MOUTH EVERY 4 TO 6 HOURS AS NEEDED FOR PAIN -     traMADol (ULTRAM) 50 MG tablet; TAKE 1 TABLET BY MOUTH EVERY 4 TO 6 HOURS AS NEEDED FOR PAIN -     ketorolac (TORADOL)  injection 60 mg; Inject 2 mLs (60 mg total) into the muscle once.  Bilateral low back pain without sciatica, unspecified chronicity  Panic attacks -     diazepam (VALIUM) 2 MG tablet; Take 1 tablet (2 mg total) by mouth every 12 (twelve) hours as needed for anxiety.   Toradol injection given today.  Follow up in 4 weeks.

## 2017-01-28 ENCOUNTER — Encounter: Payer: Self-pay | Admitting: Physician Assistant

## 2017-01-29 ENCOUNTER — Ambulatory Visit (INDEPENDENT_AMBULATORY_CARE_PROVIDER_SITE_OTHER): Payer: Medicare Other | Admitting: Family Medicine

## 2017-01-29 VITALS — BP 143/72 | HR 62 | Wt 226.0 lb

## 2017-01-29 DIAGNOSIS — M7062 Trochanteric bursitis, left hip: Secondary | ICD-10-CM

## 2017-01-29 DIAGNOSIS — M2342 Loose body in knee, left knee: Secondary | ICD-10-CM

## 2017-01-29 MED ORDER — GABAPENTIN 300 MG PO CAPS: ORAL_CAPSULE | ORAL | 5 refills | Status: DC

## 2017-01-29 NOTE — Patient Instructions (Signed)
Thank you for coming in today. Follow up with Luvenia Starch about racing thoughts and axiety.  Work on the exercises we discussed.   1) IT band stretch.  Bring your left knee across your body.  2) Figure 4 stretch: Bring your shin up to your chest.  3)  Side leg raises do 30 reps 3x daily.    Trochanteric Bursitis Rehab Ask your health care provider which exercises are safe for you. Do exercises exactly as told by your health care provider and adjust them as directed. It is normal to feel mild stretching, pulling, tightness, or discomfort as you do these exercises, but you should stop right away if you feel sudden pain or your pain gets worse.Do not begin these exercises until told by your health care provider. Stretching exercises These exercises warm up your muscles and joints and improve the movement and flexibility of your hip. These exercises also help to relieve pain and stiffness. Exercise A: Iliotibial band stretch   1. Lie on your side with your left / right leg in the top position. 2. Bend your left / right knee and grab your ankle. 3. Slowly bring your knee back so your thigh is behind your body. 4. Slowly lower your knee toward the floor until you feel a gentle stretch on the outside of your left / right thigh. If you do not feel a stretch and your knee will not fall farther, place the heel of your other foot on top of your outer knee and pull your thigh down farther. 5. Hold this position for __________ seconds. 6. Slowly return to the starting position. Repeat __________ times. Complete this exercise __________ times a day. Strengthening exercises These exercises build strength and endurance in your hip and pelvis. Endurance is the ability to use your muscles for a long time, even after they get tired. Exercise B: Bridge (  hip extensors) 1. Lie on your back on a firm surface with your knees bent and your feet flat on the floor. 2. Tighten your buttocks muscles and lift your  buttocks off the floor until your trunk is level with your thighs. You should feel the muscles working in your buttocks and the back of your thighs. If this exercise is too easy, try doing it with your arms crossed over your chest. 3. Hold this position for __________ seconds. 4. Slowly return to the starting position. 5. Let your muscles relax completely between repetitions. Repeat __________ times. Complete this exercise __________ times a day. Exercise C: Squats ( knee extensors and  quadriceps) 1. Stand in front of a table, with your feet and knees pointing straight ahead. You may rest your hands on the table for balance but not for support. 2. Slowly bend your knees and lower your hips like you are going to sit in a chair.  Keep your weight over your heels, not over your toes.  Keep your lower legs upright so they are parallel with the table legs.  Do not let your hips go lower than your knees.  Do not bend lower than told by your health care provider.  If your hip pain increases, do not bend as low. 3. Hold this position for __________ seconds. 4. Slowly push with your legs to return to standing. Do not use your hands to pull yourself to standing. Repeat __________ times. Complete this exercise __________ times a day. Exercise D: Hip hike 1. Stand sideways on a bottom step. Stand on your left / right leg with  your other foot unsupported next to the step. You can hold onto the railing or wall if needed for balance. 2. Keeping your knees straight and your torso square, lift your left / right hip up toward the ceiling. 3. Hold this position for __________ seconds. 4. Slowly let your left / right hip lower toward the floor, past the starting position. Your foot should get closer to the floor. Do not lean or bend your knees. Repeat __________ times. Complete this exercise __________ times a day. Exercise E: Single leg stand 1. Stand near a counter or door frame that you can hold onto  for balance as needed. It is helpful to stand in front of a mirror for this exercise so you can watch your hip. 2. Squeeze your left / right buttock muscles then lift up your other foot. Do not let your left / right hip push out to the side. 3. Hold this position for __________ seconds. Repeat __________ times. Complete this exercise __________ times a day. This information is not intended to replace advice given to you by your health care provider. Make sure you discuss any questions you have with your health care provider. Document Released: 10/24/2004 Document Revised: 05/23/2016 Document Reviewed: 09/01/2015 Elsevier Interactive Patient Education  2017 [employer].

## 2017-01-29 NOTE — Progress Notes (Signed)
Aimee Little is a 56 y.o. female who presents to [facility] today for left knee pain. Patient has been seen several times for left knee pain attributable to DJD and a loose body. She was pain-free until recently. She notes of the last week or so worsening left knee pain. She is interested in a steroid injection if possible.  Additionally she notes pain in the left lateral hip. She thinks this is probably related to her lumbar radiculopathy that she's had previously. She notes pain in the lateral hip worse when she lays on her side when she stands from a seated position. She denies any pain radiating below the level of her knee. No weakness or numbness.   Past Medical History:  Diagnosis Date  . DDD (degenerative disc disease), lumbar   . Depression   . Fibromyalgia   . Insomnia   . Thyroid disease    Past Surgical History:  Procedure Laterality Date  . ABDOMINAL HYSTERECTOMY     took uterus and both ovaries left   Social History  Substance Use Topics  . Smoking status: Former Smoker    Packs/day: 0.50    Quit date: 10/02/2013  . Smokeless tobacco: Never Used  . Alcohol use Not on file     ROS:  As above   Medications: Current Outpatient Prescriptions  Medication Sig Dispense Refill  . AMBULATORY NON FORMULARY MEDICATION Compression stockings, please fit to size, pressure 20-39mmHg one pair one refill. Dx Venous Insufficiency 1 Units 1  . baclofen (LIORESAL) 10 MG tablet TAKE ONE TABLET BY MOUTH 3 TIMES A DAY 90 tablet 5  . botulinum toxin Type A (BOTOX) 100 UNITS SOLR injection Inject 100 Units into the muscle.    Marland Kitchen buPROPion (WELLBUTRIN XL) 150 MG 24 hr tablet Take 1 tablet (150 mg total) by mouth daily. 30 tablet 5  . busPIRone (BUSPAR) 15 MG tablet Take 1 tablet (15 mg total) by mouth 2 (two) times daily. 60 tablet 5  . dexlansoprazole (DEXILANT) 60 MG capsule Take 1 capsule (60 mg total) by mouth daily. 30 capsule 6  .  diazepam (VALIUM) 2 MG tablet Take 1 tablet (2 mg total) by mouth every 12 (twelve) hours as needed for anxiety. 45 tablet 0  . DULoxetine (CYMBALTA) 60 MG capsule TAKE 2 CAPSULES BY MOUTH EVERY DAY 60 capsule 0  . estradiol (ESTRACE VAGINAL) 0.1 MG/GM vaginal cream Place 1 Applicatorful vaginally at bedtime. For 2 weeks. Then taper dose to 1-3 times a week. 42.5 g 5  . fluticasone (FLONASE) 50 MCG/ACT nasal spray USE 1 TO 2 SPRAYS IN EACH NOSTRIL ONCE DAILY. 16 g 6  . gabapentin (NEURONTIN) 300 MG capsule TAKE 2 CAPSULES BY MOUTH 3 TIMES A DAY AS NEEDED FOR PAIN/NEUROPATHY 180 capsule 5  . hydrOXYzine (ATARAX/VISTARIL) 25 MG tablet Take 1 tablet (25 mg total) by mouth 3 (three) times daily as needed for anxiety. 90 tablet 5  . ipratropium (ATROVENT) 0.06 % nasal spray Place 2 sprays into both nostrils 4 (four) times daily. 15 mL 1  . levothyroxine (SYNTHROID, LEVOTHROID) 150 MCG tablet TAKE ONE TABLET BY MOUTH EVERY DAY BEFORE BREAKFAST 90 tablet 3  . meloxicam (MOBIC) 15 MG tablet Take 1 tablet (15 mg total) by mouth daily. 30 tablet 2  . metoCLOPramide (REGLAN) 10 MG tablet TAKE 1/2 TO 1 TABLET BY MOUTH EVERY 8 HOURS AS NEEDED FOR NAUSEA/HEADACHE. 90 tablet 0  . Omega 3-6-9 Fatty Acids (OMEGA 3-6-9 COMPLEX  PO) Take by mouth.    . ranitidine (ZANTAC) 300 MG tablet TAKE ONE TABLET BY MOUTH 2 TIMES A DAY 180 tablet 0  . rizatriptan (MAXALT-MLT) 10 MG disintegrating tablet TAKE ONE TABLET BY MOUTH AS NEEDED. MAY REPEAT IN 2 HOURS IF NEEDED. 10 tablet 0  . rOPINIRole (REQUIP) 1 MG tablet TAKE ONE TABLET BY MOUTH 3 TIMES A DAY 90 tablet 5  . topiramate (TOPAMAX) 100 MG tablet TAKE ONE TABLET BY MOUTH 2 TIMES A DAY 60 tablet 5  . traMADol (ULTRAM) 50 MG tablet TAKE 1 TABLET BY MOUTH EVERY 4 TO 6 HOURS AS NEEDED FOR PAIN 60 tablet 1  . valACYclovir (VALTREX) 1000 MG tablet TAKE TWO TABLETS 2 TIMES A DAY AT ONSET OF COLD SORE FOR 2 DAYS 60 tablet 0  . Vitamin D, Ergocalciferol, (DRISDOL) 50000 units CAPS  capsule Take 1 capsule (50,000 Units total) by mouth every 7 (seven) days. 12 capsule 4  . VITAMIN E PO Take by mouth.    . zolpidem (AMBIEN) 5 MG tablet Take one to two tablets at bedtime for sleep 60 tablet 5   No current facility-administered medications for this visit.    Allergies  Allergen Reactions  . Doxepin Other (See Comments)  . Levothyroxine     Other reaction(s): Other (See Comments) Reaction unknown  . Mirapex [Pramipexole Dihydrochloride]     Nausea and vomiting  . Phentermine     Stomach ache/increased anxiety.   . Pregabalin   . Gabapentin     Sleep walking     Exam:  BP (!) 143/72   Pulse 62   Wt 226 lb (102.5 kg)   BMI 32.43 kg/m  General: Well Developed, well nourished, and in no acute distress.  Neuro/Psych: Alert and oriented x3, extra-ocular muscles intact, able to move all 4 extremities, sensation grossly intact. Skin: Warm and dry, no rashes noted.  Respiratory: Not using accessory muscles, speaking in full sentences, trachea midline.  Cardiovascular: Pulses palpable, no extremity edema. Abdomen: Does not appear distended. MSK:  Left knee mild effusion nontender normal motion with crepitations. Intact strength.  Left hip tender to palpation greater trochanter and along the insertion of the gluteus medius and minimus tendons onto the greater trochanter. Hip abduction strength is diminished 4/5. Normal hip range of motion.  Procedure: Real-time Ultrasound Guided Injection of left knee  Device: GE Logiq E  Images permanently stored and available for review in the ultrasound unit. Verbal informed consent obtained. Discussed risks and benefits of procedure. Warned about infection bleeding damage to structures skin hypopigmentation and fat atrophy among others. Patient expresses understanding and agreement Time-out conducted.  Noted no overlying erythema, induration, or other signs of local infection.  Skin prepped in a sterile fashion.  Local  anesthesia: Topical Ethyl chloride.  With sterile technique and under real time ultrasound guidance: 80mg  kenalog and 22ml marcaine injected easily.  Completed without difficulty  Pain immediately resolved suggesting accurate placement of the medication.  Advised to call if fevers/chills, erythema, induration, drainage, or persistent bleeding.  Images permanently stored and available for review in the ultrasound unit.  Impression: Technically successful ultrasound guided injection.      No results found for this or any previous visit (from the past 48 hour(s)). No results found.    Assessment and Plan: 56 y.o. female with  Left knee pain likely due to DJD with possible loose body association. Plan for steroid injection today. Recheck as needed in the near future.  Left  lateral hip pain is very likely greater trochanter pain syndrome. Plan for stretching and strengthening exercises discussed and the patient handout.  Recheck in about a month or so.    No orders of the defined types were placed in this encounter.   Discussed warning signs or symptoms. Please see discharge instructions. Patient expresses understanding.

## 2017-01-31 ENCOUNTER — Telehealth: Payer: Self-pay | Admitting: Physician Assistant

## 2017-01-31 MED ORDER — IPRATROPIUM BROMIDE 0.06 % NA SOLN: 2.0000 | Freq: Four times a day (QID) | NASAL | 3 refills | Status: DC

## 2017-01-31 NOTE — Telephone Encounter (Signed)
Pt states she feels like she has a sinus infection. Reports she has "been in the office already this week" and she is getting ready to go to on vacation. States she is congested, facial pain, and headaches. Requesting an antibiotic. Routing to PCP.

## 2017-01-31 NOTE — Telephone Encounter (Signed)
Pt advised, Rx for atrovent nasal spray sent in. Pt will get it tonight.

## 2017-01-31 NOTE — Telephone Encounter (Signed)
Yes we did she you earlier this week for back pain. You most certainly could have developed some sinusitis however most sinusitis clears with symptomatic care. Infact data shows that bacterial sinusitis is not as prevalent as allergic and viral sinusitis. If symptoms persist past 10 days we will then consider antibiotic. We could send over atrovent nasal spray that can work a little better for sinusitis you could consider getting some tylenol cold and sinus severe which can provide some relief. Sinuses rinses can also help.

## 2017-03-03 ENCOUNTER — Other Ambulatory Visit: Payer: Self-pay | Admitting: Physician Assistant

## 2017-03-03 DIAGNOSIS — F410 Panic disorder [episodic paroxysmal anxiety] without agoraphobia: Secondary | ICD-10-CM

## 2017-03-05 ENCOUNTER — Telehealth: Payer: Self-pay | Admitting: Physician Assistant

## 2017-03-05 ENCOUNTER — Ambulatory Visit: Payer: Medicare Other

## 2017-03-05 ENCOUNTER — Ambulatory Visit (INDEPENDENT_AMBULATORY_CARE_PROVIDER_SITE_OTHER): Payer: Medicare Other | Admitting: Physician Assistant

## 2017-03-05 ENCOUNTER — Encounter: Payer: Self-pay | Admitting: Physician Assistant

## 2017-03-05 VITALS — BP 138/83 | HR 114

## 2017-03-05 DIAGNOSIS — R51 Headache: Secondary | ICD-10-CM

## 2017-03-05 DIAGNOSIS — R519 Headache, unspecified: Secondary | ICD-10-CM

## 2017-03-05 DIAGNOSIS — R000 Tachycardia, unspecified: Secondary | ICD-10-CM

## 2017-03-05 DIAGNOSIS — F410 Panic disorder [episodic paroxysmal anxiety] without agoraphobia: Secondary | ICD-10-CM

## 2017-03-05 DIAGNOSIS — F332 Major depressive disorder, recurrent severe without psychotic features: Secondary | ICD-10-CM

## 2017-03-05 DIAGNOSIS — M797 Fibromyalgia: Secondary | ICD-10-CM | POA: Diagnosis not present

## 2017-03-05 MED ORDER — KETOROLAC TROMETHAMINE 60 MG/2ML IM SOLN
60.0000 mg | Freq: Once | INTRAMUSCULAR | Status: AC
  Administered 2017-03-05: 60 mg via INTRAMUSCULAR

## 2017-03-05 MED ORDER — VORTIOXETINE HBR 10 MG PO TABS: ORAL_TABLET | ORAL | 2 refills | Status: DC

## 2017-03-05 MED ORDER — DIAZEPAM 2 MG PO TABS: 2.0000 mg | ORAL_TABLET | Freq: Two times a day (BID) | ORAL | 5 refills | Status: DC | PRN

## 2017-03-05 NOTE — Progress Notes (Signed)
Subjective:    Patient ID: Aimee Little, female    DOB: May 21, 1961, 56 y.o.   MRN: [id number]  HPI  Pt is a 56 yo depressed female who presents to the clinic to "get back on something for depression". She went off cymbalta about 1 month ago because "it was making her brain not work". She states she would stand in parking lot and not remember where she parked. She "felt like she was not in control of her body. She also thought it causes a dull headache everyday. She hurts all the time. Her fibromyalgia flares more in summer. She missed her sister who died 3 years ago. She does have a better relationship with her son right now. She could not take wellbutrin either because it made her too nauseated. She request a toradol shot today as it makes her fibromyalgia flares better. She denies any suicidal or homicidal thoughts.   .. Active Ambulatory Problems    Diagnosis Date Noted  . HOT FLASHES 12/29/2009  . [place] DISEASE, LUMBAR 12/29/2009  . Fibromyalgia 01/01/2013  . Unspecified vitamin D deficiency 05/16/2013  . Migraine with status migrainosus 07/21/2013  . Former smoker 11/05/2013  . Chronic pain syndrome 11/05/2013  . Anxiety and depression 12/17/2013  . Adenomatous colon polyp 12/20/2013  . Obesity 11/25/2014  . Other fatigue 05/26/2015  . Bilateral low back pain without sciatica 09/06/2015  . RLS (restless legs syndrome) 09/22/2015  . Thyroid activity decreased 10/27/2015  . Insomnia 11/21/2015  . Severe episode of recurrent major depressive disorder, without psychotic features ([place]) 11/29/2015  . Rhinitis, allergic 11/29/2015  . Vaginal atrophy 01/02/2016  . Left knee pain 01/23/2016  . Panic attacks 03/19/2016  . Loose body in knee 04/17/2016  . Chondromalacia of patellofemoral joint 04/17/2016  . Diverticulitis of colon without hemorrhage 07/19/2016  . Trochanteric bursitis of left hip 01/29/2017  . Nonintractable headache 03/06/2017  . Tachycardia 03/06/2017   Resolved  Ambulatory Problems    Diagnosis Date Noted  . Unspecified hypothyroidism 12/29/2009  . TOBACCO ABUSE 06/13/2010  . GRIEF REACTION, ACUTE 12/29/2009  . Depression 02/06/2010  . Osteoarthrosis involving lower leg 12/29/2009  . NAUSEA AND VOMITING 05/02/2010  . Osteoarthritis of left knee 07/21/2013  . Medication management 11/05/2013  . Nausea with vomiting 04/07/2014  . Lumbago 11/25/2014  . Sinusitis 04/13/2015  . Sacroiliac joint dysfunction of both sides 09/06/2015  . Right knee pain 11/07/2015  . GAD (generalized anxiety disorder) 03/19/2016  . Abdominal pain, left lower quadrant 04/25/2016   Past Medical History:  Diagnosis Date  . DDD (degenerative disc disease), lumbar   . Depression   . Fibromyalgia   . Insomnia   . Thyroid disease       Review of Systems  All other systems reviewed and are negative.      Objective:   Physical Exam  Constitutional: She appears well-developed and well-nourished.  HENT:  Head: Normocephalic and atraumatic.  Eyes: Conjunctivae and EOM are normal. Pupils are equal, round, and reactive to light.  Cardiovascular: Normal rate, regular rhythm and normal heart sounds.   Pulmonary/Chest: Effort normal and breath sounds normal.  Psychiatric: She has a normal mood and affect. Her behavior is normal.          Assessment & Plan:  Marland KitchenMarland KitchenMelissa was seen today for headache.  Diagnoses and all orders for this visit:  Nonintractable headache, unspecified chronicity pattern, unspecified headache type -     ketorolac (TORADOL) injection 60 mg; Inject 2 mLs (  60 mg total) into the muscle once.  Panic attacks -     diazepam (VALIUM) 2 MG tablet; Take 1 tablet (2 mg total) by mouth every 12 (twelve) hours as needed for anxiety.  Fibromyalgia  Severe episode of recurrent major depressive disorder, without psychotic features ([facility]) -     vortioxetine HBr (TRINTELLIX) 10 MG TABS; 1/2 tablet for 7 days then increase to 1 full tablet  daily.  Tachycardia   toradol given for HA and fibromyalgia flare.  We have to get this depression under control. Pt declined psych referral or counseling.  Will start trintellix. Follow up in 8 weeks.

## 2017-03-06 ENCOUNTER — Encounter: Payer: Self-pay | Admitting: Physician Assistant

## 2017-03-06 DIAGNOSIS — R51 Headache: Secondary | ICD-10-CM

## 2017-03-06 DIAGNOSIS — R519 Headache, unspecified: Secondary | ICD-10-CM | POA: Insufficient documentation

## 2017-03-06 DIAGNOSIS — R000 Tachycardia, unspecified: Secondary | ICD-10-CM | POA: Insufficient documentation

## 2017-03-06 NOTE — Telephone Encounter (Signed)
I believe I forgot to give patient coupon card for trintellix yesterday if too expensive make sure we let her know to try that.

## 2017-03-11 ENCOUNTER — Ambulatory Visit (INDEPENDENT_AMBULATORY_CARE_PROVIDER_SITE_OTHER): Payer: Medicare Other

## 2017-03-11 DIAGNOSIS — Z1231 Encounter for screening mammogram for malignant neoplasm of breast: Secondary | ICD-10-CM | POA: Diagnosis not present

## 2017-03-11 NOTE — Progress Notes (Signed)
Call pt: normal mammogram. Follow up in 1 year.

## 2017-04-04 ENCOUNTER — Telehealth: Payer: Self-pay | Admitting: *Deleted

## 2017-04-04 MED ORDER — AMITRIPTYLINE HCL 25 MG PO TABS
25.0000 mg | ORAL_TABLET | Freq: Every day | ORAL | 1 refills | Status: DC
Start: 2017-04-04 — End: 2017-05-20

## 2017-04-04 NOTE — Telephone Encounter (Signed)
Pt called and lvm stating that the Ambien is not working for her and would like to go back on the Amitriptyline 25 mg for sleep. She currently takes trintellix for depression also. She is asking for a refill of the Amitriptyline to be sent into her pharmacy. Will send to Dr. Madilyn Fireman to see if this is ok for refill since pt has not taken in several years.Aimee Little

## 2017-04-04 NOTE — Telephone Encounter (Signed)
Just makes sure takes the tramadol and elavil 12 hours apart.

## 2017-04-04 NOTE — Telephone Encounter (Signed)
Okay, new prescription sent for amitriptyline. Do not mix with tramadol. Please remove Ambien for medication list.

## 2017-04-04 NOTE — Telephone Encounter (Signed)
Pt informed of interaction when taking amitriptyline and tramadol (Seizures and serotonin syndrome). She stated that she does not over do it with the Tramadol, and has taken this in the past and has not had any issues. She said that she does not do a lot of driving. Ambien removed from med list. Will fwd to Dr. Madilyn Fireman for advice.

## 2017-04-07 NOTE — Telephone Encounter (Signed)
Information discussed with pt. Pt verbalized understanding. 

## 2017-04-15 ENCOUNTER — Other Ambulatory Visit: Payer: Self-pay | Admitting: Physician Assistant

## 2017-04-15 DIAGNOSIS — G894 Chronic pain syndrome: Secondary | ICD-10-CM

## 2017-05-05 ENCOUNTER — Other Ambulatory Visit: Payer: Self-pay | Admitting: Physician Assistant

## 2017-05-16 ENCOUNTER — Ambulatory Visit: Payer: Medicare Other | Admitting: Physician Assistant

## 2017-05-16 ENCOUNTER — Other Ambulatory Visit: Payer: Self-pay | Admitting: Physician Assistant

## 2017-05-16 DIAGNOSIS — G894 Chronic pain syndrome: Secondary | ICD-10-CM

## 2017-05-20 ENCOUNTER — Ambulatory Visit (INDEPENDENT_AMBULATORY_CARE_PROVIDER_SITE_OTHER): Payer: Medicare Other | Admitting: Physician Assistant

## 2017-05-20 ENCOUNTER — Other Ambulatory Visit: Payer: Self-pay | Admitting: Physician Assistant

## 2017-05-20 VITALS — BP 157/98 | HR 87 | Temp 98.4°F

## 2017-05-20 DIAGNOSIS — M797 Fibromyalgia: Secondary | ICD-10-CM | POA: Diagnosis not present

## 2017-05-20 DIAGNOSIS — G894 Chronic pain syndrome: Secondary | ICD-10-CM | POA: Diagnosis not present

## 2017-05-20 DIAGNOSIS — M5137 Other intervertebral disc degeneration, lumbosacral region: Secondary | ICD-10-CM

## 2017-05-20 DIAGNOSIS — Z23 Encounter for immunization: Secondary | ICD-10-CM

## 2017-05-20 DIAGNOSIS — M545 Low back pain, unspecified: Secondary | ICD-10-CM

## 2017-05-20 MED ORDER — TRAMADOL HCL 50 MG PO TABS: ORAL_TABLET | ORAL | 2 refills | Status: DC

## 2017-05-20 MED ORDER — KETOROLAC TROMETHAMINE 60 MG/2ML IM SOLN
60.0000 mg | Freq: Once | INTRAMUSCULAR | Status: AC
  Administered 2017-05-20: 60 mg via INTRAMUSCULAR

## 2017-05-20 NOTE — Progress Notes (Signed)
Subjective:    Patient ID: Aimee Little, female    DOB: 07-12-1961, 56 y.o.   MRN: [id number]  HPI  Pt is a 56 yo female who presents to the clinic to follow up with pain.  .. Active Ambulatory Problems    Diagnosis Date Noted  . HOT FLASHES 12/29/2009  . [place] DISEASE, LUMBAR 12/29/2009  . Fibromyalgia 01/01/2013  . Unspecified vitamin D deficiency 05/16/2013  . Migraine with status migrainosus 07/21/2013  . Former smoker 11/05/2013  . Chronic pain syndrome 11/05/2013  . Anxiety and depression 12/17/2013  . Adenomatous colon polyp 12/20/2013  . Obesity 11/25/2014  . Other fatigue 05/26/2015  . Bilateral low back pain without sciatica 09/06/2015  . RLS (restless legs syndrome) 09/22/2015  . Thyroid activity decreased 10/27/2015  . Insomnia 11/21/2015  . Severe episode of recurrent major depressive disorder, without psychotic features ([place]) 11/29/2015  . Rhinitis, allergic 11/29/2015  . Vaginal atrophy 01/02/2016  . Left knee pain 01/23/2016  . Panic attacks 03/19/2016  . Loose body in knee 04/17/2016  . Chondromalacia of patellofemoral joint 04/17/2016  . Diverticulitis of colon without hemorrhage 07/19/2016  . Trochanteric bursitis of left hip 01/29/2017  . Nonintractable headache 03/06/2017  . Tachycardia 03/06/2017   Resolved Ambulatory Problems    Diagnosis Date Noted  . Unspecified hypothyroidism 12/29/2009  . TOBACCO ABUSE 06/13/2010  . GRIEF REACTION, ACUTE 12/29/2009  . Depression 02/06/2010  . Osteoarthrosis involving lower leg 12/29/2009  . NAUSEA AND VOMITING 05/02/2010  . Osteoarthritis of left knee 07/21/2013  . Medication management 11/05/2013  . Nausea with vomiting 04/07/2014  . Lumbago 11/25/2014  . Sinusitis 04/13/2015  . Sacroiliac joint dysfunction of both sides 09/06/2015  . Right knee pain 11/07/2015  . GAD (generalized anxiety disorder) 03/19/2016  . Abdominal pain, left lower quadrant 04/25/2016   Past Medical History:  Diagnosis Date   . DDD (degenerative disc disease), lumbar   . Depression   . Fibromyalgia   . Insomnia   . Thyroid disease    Pt fell last week on tennis shoes. That set her back but she has been walking 20 minutes a day. She tried swimming but was too painful on skin. She request to be able to increase tramadol a bit. She has a lot of back pain, and her achying all over from fibromyalgia.      Review of Systems See HPI>     Objective:   Physical Exam  Constitutional: She is oriented to person, place, and time. She appears well-developed and well-nourished.  Cardiovascular: Normal rate, regular rhythm and normal heart sounds.   Neurological: She is alert and oriented to person, place, and time.  Psychiatric: She has a normal mood and affect. Her behavior is normal.          Assessment & Plan:  Marland KitchenMarland KitchenMelissa was seen today for follow-up.  Diagnoses and all orders for this visit:  Chronic pain syndrome -     traMADol (ULTRAM) 50 MG tablet; TAKE ONE to TWO TABLETs BY MOUTH EVERY  8-12 HOURS AS NEEDED FOR PAIN -     Drug Scr Ur, Pain Mgmt, Reflex Conf -     ketorolac (TORADOL) injection 60 mg; Inject 2 mLs (60 mg total) into the muscle once.  Flu vaccine need -     Flu Vaccine QUAD 6+ mos PF IM (Fluarix Quad PF)        Opioid Risk Tool - 05/20/17 1701      Family History of  Substance Abuse   Alcohol Negative   Illegal Drugs Negative     Personal History of Substance Abuse   Alcohol Positive Female or Female   Illegal Drugs Negative   Rx Drugs Negative     Age   Age between 56-45 years  No     History of Preadolescent Sexual Abuse   History of Preadolescent Sexual Abuse Negative or Female     Psychological Disease   Psychological Disease Negative   Depression Positive     Total Score   Opioid Risk Tool Scoring 4   Opioid Risk Interpretation Moderate Risk     Pain contract signed.  [place] controlled substance database reviewed with no concerns.  UDS ordered.  Discussed going  to PT. Pt declined.  Pt is on trintellix due to cymbalta causing her to feel like she was in a fog. On gabapentin. On Mobic. Tried TCA but gained too much weight.

## 2017-05-28 ENCOUNTER — Other Ambulatory Visit: Payer: Self-pay | Admitting: Physician Assistant

## 2017-05-28 ENCOUNTER — Other Ambulatory Visit: Payer: Self-pay

## 2017-05-28 DIAGNOSIS — M62838 Other muscle spasm: Secondary | ICD-10-CM

## 2017-05-28 MED ORDER — BACLOFEN 10 MG PO TABS: ORAL_TABLET | ORAL | 1 refills | Status: DC

## 2017-05-28 MED ORDER — MELOXICAM 15 MG PO TABS: 15.0000 mg | ORAL_TABLET | Freq: Every day | ORAL | 1 refills | Status: DC

## 2017-05-28 MED ORDER — METOCLOPRAMIDE HCL 10 MG PO TABS: ORAL_TABLET | ORAL | 1 refills | Status: DC

## 2017-05-28 NOTE — Progress Notes (Signed)
6 month refills sent per PCP

## 2017-05-29 LAB — PAIN MGMT, PROFILE 6 CONF W/O MM, U
6 Acetylmorphine: NEGATIVE ng/mL
Alcohol Metabolites: POSITIVE ng/mL — AB
Alphahydroxyalprazolam: NEGATIVE ng/mL
Alphahydroxymidazolam: NEGATIVE ng/mL
Alphahydroxytriazolam: NEGATIVE ng/mL
Aminoclonazepam: NEGATIVE ng/mL
Amphetamines: NEGATIVE ng/mL
Barbiturates: NEGATIVE ng/mL
Benzodiazepines: POSITIVE ng/mL — AB
Cocaine Metabolite: NEGATIVE ng/mL
Creatinine: 72.8 mg/dL
Ethyl Glucuronide (ETG): 19573 ng/mL — ABNORMAL HIGH
Ethyl Sulfate (ETS): 5466 ng/mL — ABNORMAL HIGH
Hydroxyethylflurazepam: NEGATIVE ng/mL
Lorazepam: NEGATIVE ng/mL
Marijuana Metabolite: NEGATIVE ng/mL
Methadone Metabolite: NEGATIVE ng/mL
Nordiazepam: 94 ng/mL — ABNORMAL HIGH
Opiates: NEGATIVE ng/mL
Oxazepam: 139 ng/mL — ABNORMAL HIGH
Oxidant: NEGATIVE ug/mL
Oxycodone: NEGATIVE ng/mL
Phencyclidine: NEGATIVE ng/mL
Please note:: 0
Temazepam: 222 ng/mL — ABNORMAL HIGH
pH: 6.95

## 2017-06-03 NOTE — Progress Notes (Signed)
Call pt:  Let patient know benzodiapines and alcohol showed up in her system. YOU CAN NOT drink and take benzo's. It is our goal to limit benzo use and tramadol as well. We will periodically check UDs for alcohol if we continue to find this we will have to hold on prescribing benzo's.

## 2017-06-06 ENCOUNTER — Ambulatory Visit (INDEPENDENT_AMBULATORY_CARE_PROVIDER_SITE_OTHER): Payer: Medicare Other | Admitting: Family Medicine

## 2017-06-06 VITALS — BP 146/75 | HR 96 | Wt 220.0 lb

## 2017-06-06 DIAGNOSIS — M25562 Pain in left knee: Secondary | ICD-10-CM | POA: Diagnosis not present

## 2017-06-06 DIAGNOSIS — M2242 Chondromalacia patellae, left knee: Secondary | ICD-10-CM | POA: Diagnosis not present

## 2017-06-06 DIAGNOSIS — M2342 Loose body in knee, left knee: Secondary | ICD-10-CM | POA: Diagnosis not present

## 2017-06-06 MED ORDER — DICLOFENAC SODIUM 1 % TD GEL: 4.0000 g | Freq: Four times a day (QID) | TRANSDERMAL | 11 refills | Status: DC

## 2017-06-06 NOTE — Progress Notes (Signed)
Aimee Little is a 56 y.o. female who presents to [place] today for left knee pain. Patient notes continued left knee pain. She was seen about 4 months ago for left knee pain where she received a steroid injection. This worked well until about a week ago. She has worsening pain and swelling. She notes occasional locking and catching. No fevers chills nausea vomiting or diarrhea.   Past Medical History:  Diagnosis Date  . DDD (degenerative disc disease), lumbar   . Depression   . Fibromyalgia   . Insomnia   . Thyroid disease    Past Surgical History:  Procedure Laterality Date  . ABDOMINAL HYSTERECTOMY     took uterus and both ovaries left   Social History  Substance Use Topics  . Smoking status: Former Smoker    Packs/day: 0.50    Quit date: 10/02/2013  . Smokeless tobacco: Never Used  . Alcohol use Not on file     ROS:  As above   Medications: Current Outpatient Prescriptions  Medication Sig Dispense Refill  . AMBULATORY NON FORMULARY MEDICATION Compression stockings, please fit to size, pressure 20-50mmHg one pair one refill. Dx Venous Insufficiency 1 Units 1  . baclofen (LIORESAL) 10 MG tablet TAKE ONE TABLET BY MOUTH 3 TIMES A DAY 270 tablet 1  . botulinum toxin Type A (BOTOX) 100 UNITS SOLR injection Inject 100 Units into the muscle.    . busPIRone (BUSPAR) 15 MG tablet Take 1 tablet (15 mg total) by mouth 2 (two) times daily. 60 tablet 5  . dexlansoprazole (DEXILANT) 60 MG capsule Take 1 capsule (60 mg total) by mouth daily. 30 capsule 6  . diazepam (VALIUM) 2 MG tablet Take 1 tablet (2 mg total) by mouth every 12 (twelve) hours as needed for anxiety. 60 tablet 5  . diclofenac sodium (VOLTAREN) 1 % GEL Apply 4 g topically 4 (four) times daily. To affected joint. 100 g 11  . estradiol (ESTRACE VAGINAL) 0.1 MG/GM vaginal cream Place 1 Applicatorful vaginally at bedtime. For 2 weeks. Then taper dose to 1-3 times a week.  42.5 g 5  . fluticasone (FLONASE) 50 MCG/ACT nasal spray USE 1 TO 2 SPRAYS IN EACH NOSTRIL ONCE DAILY. 16 g 6  . gabapentin (NEURONTIN) 300 MG capsule TAKE 2 CAPSULES BY MOUTH 3 TIMES A DAY AS NEEDED FOR PAIN/NEUROPATHY 180 capsule 5  . hydrOXYzine (ATARAX/VISTARIL) 25 MG tablet Take 1 tablet (25 mg total) by mouth 3 (three) times daily as needed for anxiety. 90 tablet 5  . ipratropium (ATROVENT) 0.06 % nasal spray Place 2 sprays into both nostrils 4 (four) times daily. 15 mL 3  . levothyroxine (SYNTHROID, LEVOTHROID) 150 MCG tablet TAKE ONE TABLET BY MOUTH EVERY DAY BEFORE BREAKFAST 90 tablet 3  . meloxicam (MOBIC) 15 MG tablet Take 1 tablet (15 mg total) by mouth daily. 90 tablet 1  . metoCLOPramide (REGLAN) 10 MG tablet TAKE 1/2 TO 1 TABLET BY MOUTH EVERY 8 HOURS AS NEEDED FOR NAUSEA/HEADACHE. 90 tablet 1  . Omega 3-6-9 Fatty Acids (OMEGA 3-6-9 COMPLEX PO) Take by mouth.    . ranitidine (ZANTAC) 300 MG tablet TAKE ONE TABLET BY MOUTH 2 TIMES A DAY 180 tablet 0  . rizatriptan (MAXALT-MLT) 10 MG disintegrating tablet TAKE ONE TABLET BY MOUTH AS NEEDED. MAY REPEAT IN 2 HOURS IF NEEDED. 10 tablet 0  . rOPINIRole (REQUIP) 1 MG tablet TAKE ONE TABLET BY MOUTH 3 TIMES A DAY 90 tablet 5  .  topiramate (TOPAMAX) 100 MG tablet TAKE ONE TABLET BY MOUTH 2 TIMES A DAY 60 tablet 5  . traMADol (ULTRAM) 50 MG tablet TAKE ONE to TWO TABLETs BY MOUTH EVERY  8-12 HOURS AS NEEDED FOR PAIN 120 tablet 2  . valACYclovir (VALTREX) 1000 MG tablet TAKE TWO TABLETS 2 TIMES A DAY AT ONSET OF COLD SORE FOR 2 DAYS 60 tablet 0  . Vitamin D, Ergocalciferol, (DRISDOL) 50000 units CAPS capsule Take 1 capsule (50,000 Units total) by mouth every 7 (seven) days. 12 capsule 4  . VITAMIN E PO Take by mouth.    . vortioxetine HBr (TRINTELLIX) 10 MG TABS 1/2 tablet for 7 days then increase to 1 full tablet daily. 30 tablet 2   No current facility-administered medications for this visit.    Allergies  Allergen Reactions  . Doxepin  Other (See Comments)  . Levothyroxine     Other reaction(s): Other (See Comments) Reaction unknown  . Mirapex [Pramipexole Dihydrochloride]     Nausea and vomiting  . Phentermine     Stomach ache/increased anxiety.   . Pregabalin   . Gabapentin     Sleep walking     Exam:  BP (!) 146/75   Pulse 96   Wt 220 lb (99.8 kg)   SpO2 95%   BMI 31.57 kg/m  General: Well Developed, well nourished, and in no acute distress.  Neuro/Psych: Alert and oriented x3, extra-ocular muscles intact, able to move all 4 extremities, sensation grossly intact. Skin: Warm and dry, no rashes noted.  Respiratory: Not using accessory muscles, speaking in full sentences, trachea midline.  Cardiovascular: Pulses palpable, no extremity edema. Abdomen: Does not appear distended. MSK:  Left knee minimal effusion no erythema. Nontender normal motion. Stable ligamentous exam.  Procedure: Real-time Ultrasound Guided Injection of left knee  Device: GE Logiq E  Images permanently stored and available for review in the ultrasound unit. Verbal informed consent obtained. Discussed risks and benefits of procedure. Warned about infection bleeding damage to structures skin hypopigmentation and fat atrophy among others. Patient expresses understanding and agreement Time-out conducted.  Noted no overlying erythema, induration, or other signs of local infection.  Skin prepped in a sterile fashion.  Local anesthesia: Topical Ethyl chloride.  With sterile technique and under real time ultrasound guidance: 80mg  depomedrol and 1ml marcaine injected easily.  Completed without difficulty  Pain immediately resolved suggesting accurate placement of the medication.  Advised to call if fevers/chills, erythema, induration, drainage, or persistent bleeding.  Images permanently stored and available for review in the ultrasound unit.  Impression: Technically successful ultrasound guided injection.      No results  found for this or any previous visit (from the past 48 hour(s)). No results found.    Assessment and Plan: 56 y.o. female with left knee pain due to DJD with some loose body component. Plan for steroid injection as above. Additionally we'll continue to work on quadriceps strengthening exercises. We'll also use diclofenac gel and recheck as needed.  Exacerbation of his existing problem.    No orders of the defined types were placed in this encounter.  Meds ordered this encounter  Medications  . diclofenac sodium (VOLTAREN) 1 % GEL    Sig: Apply 4 g topically 4 (four) times daily. To affected joint.    Dispense:  100 g    Refill:  11    Discussed warning signs or symptoms. Please see discharge instructions. Patient expresses understanding.

## 2017-06-06 NOTE — Patient Instructions (Addendum)
Thank you for coming in today. Call or go to the ER if you develop a large red swollen joint with extreme pain or oozing puss.  Use the diclofenac gel.  Recheck as needed.

## 2017-06-12 ENCOUNTER — Other Ambulatory Visit: Payer: Self-pay | Admitting: Physician Assistant

## 2017-06-12 DIAGNOSIS — F332 Major depressive disorder, recurrent severe without psychotic features: Secondary | ICD-10-CM

## 2017-06-25 ENCOUNTER — Other Ambulatory Visit: Payer: Self-pay | Admitting: Sports Medicine

## 2017-06-25 DIAGNOSIS — F329 Major depressive disorder, single episode, unspecified: Secondary | ICD-10-CM

## 2017-06-25 DIAGNOSIS — F32A Depression, unspecified: Secondary | ICD-10-CM

## 2017-06-25 DIAGNOSIS — F419 Anxiety disorder, unspecified: Principal | ICD-10-CM

## 2017-06-26 ENCOUNTER — Telehealth: Payer: Self-pay

## 2017-06-26 NOTE — Telephone Encounter (Signed)
Pre Authorization was sent to Cover My Meds. Key: [id number] - [place] Case ID: [id number]

## 2017-06-30 NOTE — Telephone Encounter (Signed)
Approved through 09/29/2017

## 2017-07-01 LAB — HM COLONOSCOPY

## 2017-07-07 ENCOUNTER — Other Ambulatory Visit: Payer: Self-pay | Admitting: Physician Assistant

## 2017-07-15 ENCOUNTER — Ambulatory Visit (INDEPENDENT_AMBULATORY_CARE_PROVIDER_SITE_OTHER): Payer: 59 | Admitting: Family Medicine

## 2017-07-15 ENCOUNTER — Encounter: Payer: Self-pay | Admitting: Family Medicine

## 2017-07-15 VITALS — BP 141/82 | HR 98 | Temp 98.0°F | Wt 214.0 lb

## 2017-07-15 DIAGNOSIS — J0101 Acute recurrent maxillary sinusitis: Secondary | ICD-10-CM

## 2017-07-15 MED ORDER — AZITHROMYCIN 250 MG PO TABS: 250.0000 mg | ORAL_TABLET | Freq: Every day | ORAL | 0 refills | Status: DC

## 2017-07-15 NOTE — Progress Notes (Signed)
Aimee Little is a 56 y.o. female who presents to [facility]: [place] today for cough congestion runny nose sinus pain and pressure. Symptoms present intermittently for 3 weeks. Patient has tried several over-the-counter medications which have not helped. She has bilateral maxillary sinus pressure. She denies any fevers chills vomiting or diarrhea. In the past she has done well with intramuscular steroid injection.   Past Medical History:  Diagnosis Date  . DDD (degenerative disc disease), lumbar   . Depression   . Fibromyalgia   . Insomnia   . Thyroid disease    Past Surgical History:  Procedure Laterality Date  . ABDOMINAL HYSTERECTOMY     took uterus and both ovaries left   Social History  Substance Use Topics  . Smoking status: Former Smoker    Packs/day: 0.50    Quit date: 10/02/2013  . Smokeless tobacco: Never Used  . Alcohol use Not on file   family history includes Alcohol abuse in her brother; Depression in her mother; Diabetes in her sister; Heart attack in her father; Hyperlipidemia in her mother; Hypertension in her brother, mother, and sister.  ROS as above:  Medications: Current Outpatient Prescriptions  Medication Sig Dispense Refill  . AMBULATORY NON FORMULARY MEDICATION Compression stockings, please fit to size, pressure 20-94mmHg one pair one refill. Dx Venous Insufficiency 1 Units 1  . baclofen (LIORESAL) 10 MG tablet TAKE ONE TABLET BY MOUTH 3 TIMES A DAY 270 tablet 1  . botulinum toxin Type A (BOTOX) 100 UNITS SOLR injection Inject 100 Units into the muscle.    . busPIRone (BUSPAR) 15 MG tablet TAKE ONE TABLET BY MOUTH 2 TIMES A DAY 60 tablet 1  . dexlansoprazole (DEXILANT) 60 MG capsule Take 1 capsule (60 mg total) by mouth daily. 30 capsule 6  . diazepam (VALIUM) 2 MG tablet Take 1 tablet (2 mg total) by mouth every 12 (twelve) hours as needed  for anxiety. 60 tablet 5  . diclofenac sodium (VOLTAREN) 1 % GEL Apply 4 g topically 4 (four) times daily. To affected joint. 100 g 11  . estradiol (ESTRACE VAGINAL) 0.1 MG/GM vaginal cream Place 1 Applicatorful vaginally at bedtime. For 2 weeks. Then taper dose to 1-3 times a week. 42.5 g 5  . fluticasone (FLONASE) 50 MCG/ACT nasal spray USE 1 TO 2 SPRAYS IN EACH NOSTRIL ONCE DAILY. 16 g 6  . gabapentin (NEURONTIN) 300 MG capsule TAKE 2 CAPSULES BY MOUTH 3 TIMES A DAY AS NEEDED FOR PAIN/NEUROPATHY 180 capsule 5  . hydrOXYzine (ATARAX/VISTARIL) 25 MG tablet Take 1 tablet (25 mg total) by mouth 3 (three) times daily as needed for anxiety. 90 tablet 5  . ipratropium (ATROVENT) 0.06 % nasal spray Place 2 sprays into both nostrils 4 (four) times daily. 15 mL 3  . levothyroxine (SYNTHROID, LEVOTHROID) 150 MCG tablet TAKE ONE TABLET BY MOUTH EVERY DAY BEFORE BREAKFAST 90 tablet 3  . meloxicam (MOBIC) 15 MG tablet Take 1 tablet (15 mg total) by mouth daily. 90 tablet 1  . metoCLOPramide (REGLAN) 10 MG tablet TAKE 1/2 TO 1 TABLET BY MOUTH EVERY 8 HOURS AS NEEDED FOR NAUSEA/HEADACHE. 90 tablet 1  . Omega 3-6-9 Fatty Acids (OMEGA 3-6-9 COMPLEX PO) Take by mouth.    . ranitidine (ZANTAC) 300 MG tablet TAKE ONE TABLET BY MOUTH 2 TIMES A DAY 180 tablet 0  . rizatriptan (MAXALT-MLT) 10 MG disintegrating tablet TAKE ONE TABLET BY MOUTH AS NEEDED. MAY REPEAT  IN 2 HOURS IF NEEDED. 10 tablet 0  . rOPINIRole (REQUIP) 0.25 MG tablet TAKE 1 TABLET BY MOUTH BEFORE BED (MAY INCREASE BY 1 TABLET EVERY 4 DAYS UNTIL A MAXIMUM OF 4 TABLETS PER DAY) 120 tablet 0  . rOPINIRole (REQUIP) 1 MG tablet TAKE ONE TABLET BY MOUTH 3 TIMES A DAY 90 tablet 5  . topiramate (TOPAMAX) 100 MG tablet TAKE ONE TABLET BY MOUTH 2 TIMES A DAY 60 tablet 5  . traMADol (ULTRAM) 50 MG tablet TAKE ONE to TWO TABLETs BY MOUTH EVERY  8-12 HOURS AS NEEDED FOR PAIN 120 tablet 2  . valACYclovir (VALTREX) 1000 MG tablet TAKE TWO TABLETS 2 TIMES A DAY AT  ONSET OF COLD SORE FOR 2 DAYS 60 tablet 0  . Vitamin D, Ergocalciferol, (DRISDOL) 50000 units CAPS capsule Take 1 capsule (50,000 Units total) by mouth every 7 (seven) days. 12 capsule 4  . VITAMIN E PO Take by mouth.    . vortioxetine HBr (TRINTELLIX) 10 MG TABS Take 1 tablet (10 mg total) by mouth daily. 30 tablet 2  . azithromycin (ZITHROMAX) 250 MG tablet Take 1 tablet (250 mg total) by mouth daily. Take first 2 tablets together, then 1 every day until finished. 6 tablet 0   No current facility-administered medications for this visit.    Allergies  Allergen Reactions  . Doxepin Other (See Comments)  . Levothyroxine     Other reaction(s): Other (See Comments) Reaction unknown  . Mirapex [Pramipexole Dihydrochloride]     Nausea and vomiting  . Phentermine     Stomach ache/increased anxiety.   . Pregabalin   . Gabapentin     Sleep walking    Health Maintenance Health Maintenance  Topic Date Due  . Hepatitis C Screening  [birth year]  . HIV Screening  12/04/1974  . COLONOSCOPY  12/20/2016  . MAMMOGRAM  03/12/2019  . TETANUS/TDAP  01/02/2023  . INFLUENZA VACCINE  Completed     Exam:  BP (!) 141/82   Pulse 98   Temp 98 F (36.7 C) (Oral)   Wt 214 lb (97.1 kg)   SpO2 98%   BMI 30.71 kg/m  Gen: Well NAD HEENT: EOMI,  MMM Clear nasal discharge. Nasal turbinates are inflamed and erythematous. Maxillary sinuses are tender to palpation bilaterally. Tympanic membranes are normal appearing. Posterior pharynx significant for cobblestoning. Mild cervical lymphadenopathy present. Lungs: Normal work of breathing. CTABL Heart: RRR no MRG Abd: NABS, Soft. Nondistended, Nontender Exts: Brisk capillary refill, warm and well perfused.    No results found for this or any previous visit (from the past 72 hour(s)). No results found.    Assessment and Plan: 56 y.o. female with sinusitis potentially viral or allergic. Patient may be developing secondary sickening bacterial sinus  infection. Little treat with one time injection of intramuscular Depo-Medrol 80 mg today. I have prescribed azithromycin to use if not improving or if worsening.   No orders of the defined types were placed in this encounter.  Meds ordered this encounter  Medications  . azithromycin (ZITHROMAX) 250 MG tablet    Sig: Take 1 tablet (250 mg total) by mouth daily. Take first 2 tablets together, then 1 every day until finished.    Dispense:  6 tablet    Refill:  0     Discussed warning signs or symptoms. Please see discharge instructions. Patient expresses understanding.  I spent 25 minutes with this patient, greater than 50% was face-to-face time counseling regarding ddx and treatment plan.

## 2017-07-15 NOTE — Patient Instructions (Signed)
Thank you for coming in today. Continue the over the counter medication.  Take azithromycin if not better.  Call or go to the emergency room if you get worse, have trouble breathing, have chest pains, or palpitations.    Sinusitis, Adult Sinusitis is soreness and inflammation of your sinuses. Sinuses are hollow spaces in the bones around your face. Your sinuses are located:  Around your eyes.  In the middle of your forehead.  Behind your nose.  In your cheekbones.  Your sinuses and nasal passages are lined with a stringy fluid (mucus). Mucus normally drains out of your sinuses. When your nasal tissues become inflamed or swollen, the mucus can become trapped or blocked so air cannot flow through your sinuses. This allows bacteria, viruses, and funguses to grow, which leads to infection. Sinusitis can develop quickly and last for 7?10 days (acute) or for more than 12 weeks (chronic). Sinusitis often develops after a cold. What are the causes? This condition is caused by anything that creates swelling in the sinuses or stops mucus from draining, including:  Allergies.  Asthma.  Bacterial or viral infection.  Abnormally shaped bones between the nasal passages.  Nasal growths that contain mucus (nasal polyps).  Narrow sinus openings.  Pollutants, such as chemicals or irritants in the air.  A foreign object stuck in the nose.  A fungal infection. This is rare.  What increases the risk? The following factors may make you more likely to develop this condition:  Having allergies or asthma.  Having had a recent cold or respiratory tract infection.  Having structural deformities or blockages in your nose or sinuses.  Having a weak immune system.  Doing a lot of swimming or diving.  Overusing nasal sprays.  Smoking.  What are the signs or symptoms? The main symptoms of this condition are pain and a feeling of pressure around the affected sinuses. Other symptoms  include:  Upper toothache.  Earache.  Headache.  Bad breath.  Decreased sense of smell and taste.  A cough that may get worse at night.  Fatigue.  Fever.  Thick drainage from your nose. The drainage is often green and it may contain pus (purulent).  Stuffy nose or congestion.  Postnasal drip. This is when extra mucus collects in the throat or back of the nose.  Swelling and warmth over the affected sinuses.  Sore throat.  Sensitivity to light.  How is this diagnosed? This condition is diagnosed based on symptoms, a medical history, and a physical exam. To find out if your condition is acute or chronic, your health care provider may:  Look in your nose for signs of nasal polyps.  Tap over the affected sinus to check for signs of infection.  View the inside of your sinuses using an imaging device that has a light attached (endoscope).  If your health care provider suspects that you have chronic sinusitis, you may also:  Be tested for allergies.  Have a sample of mucus taken from your nose (nasal culture) and checked for bacteria.  Have a mucus sample examined to see if your sinusitis is related to an allergy.  If your sinusitis does not respond to treatment and it lasts longer than 8 weeks, you may have an MRI or CT scan to check your sinuses. These scans also help to determine how severe your infection is. In rare cases, a bone biopsy may be done to rule out more serious types of fungal sinus disease. How is this treated? Treatment  for sinusitis depends on the cause and whether your condition is chronic or acute. If a virus is causing your sinusitis, your symptoms will go away on their own within 10 days. You may be given medicines to relieve your symptoms, including:  Topical nasal decongestants. They shrink swollen nasal passages and let mucus drain from your sinuses.  Antihistamines. These drugs block inflammation that is triggered by allergies. This can help  to ease swelling in your nose and sinuses.  Topical nasal corticosteroids. These are nasal sprays that ease inflammation and swelling in your nose and sinuses.  Nasal saline washes. These rinses can help to get rid of thick mucus in your nose.  If your condition is caused by bacteria, you will be given an antibiotic medicine. If your condition is caused by a fungus, you will be given an antifungal medicine. Surgery may be needed to correct underlying conditions, such as narrow nasal passages. Surgery may also be needed to remove polyps. Follow these instructions at home: Medicines  Take, use, or apply over-the-counter and prescription medicines only as told by your health care provider. These may include nasal sprays.  If you were prescribed an antibiotic medicine, take it as told by your health care provider. Do not stop taking the antibiotic even if you start to feel better. Hydrate and Humidify  Drink enough water to keep your urine clear or pale yellow. Staying hydrated will help to thin your mucus.  Use a cool mist humidifier to keep the humidity level in your home above 50%.  Inhale steam for 10-15 minutes, 3-4 times a day or as told by your health care provider. You can do this in the bathroom while a hot shower is running.  Limit your exposure to cool or dry air. Rest  Rest as much as possible.  Sleep with your head raised (elevated).  Make sure to get enough sleep each night. General instructions  Apply a warm, moist washcloth to your face 3-4 times a day or as told by your health care provider. This will help with discomfort.  Wash your hands often with soap and water to reduce your exposure to viruses and other germs. If soap and water are not available, use hand sanitizer.  Do not smoke. Avoid being around people who are smoking (secondhand smoke).  Keep all follow-up visits as told by your health care provider. This is important. Contact a health care provider  if:  You have a fever.  Your symptoms get worse.  Your symptoms do not improve within 10 days. Get help right away if:  You have a severe headache.  You have persistent vomiting.  You have pain or swelling around your face or eyes.  You have vision problems.  You develop confusion.  Your neck is stiff.  You have trouble breathing. This information is not intended to replace advice given to you by your health care provider. Make sure you discuss any questions you have with your health care provider. Document Released: 09/16/2005 Document Revised: 05/12/2016 Document Reviewed: 07/12/2015 Elsevier Interactive Patient Education  2017 [employer].

## 2017-07-18 ENCOUNTER — Telehealth: Payer: Self-pay

## 2017-07-18 NOTE — Telephone Encounter (Signed)
Pt is requesting to have all medications that are appropriate to be sent to [facility].  Thanks.

## 2017-07-21 MED ORDER — TOPIRAMATE 100 MG PO TABS: ORAL_TABLET | ORAL | 5 refills | Status: DC

## 2017-07-21 MED ORDER — RIZATRIPTAN BENZOATE 10 MG PO TBDP: ORAL_TABLET | ORAL | 5 refills | Status: DC

## 2017-07-21 NOTE — Telephone Encounter (Signed)
Ok I sent topamax and maxalt those were the only ones I saw needing refill. Let me know if any others.

## 2017-08-01 ENCOUNTER — Ambulatory Visit (INDEPENDENT_AMBULATORY_CARE_PROVIDER_SITE_OTHER): Payer: 59 | Admitting: Physician Assistant

## 2017-08-01 ENCOUNTER — Encounter: Payer: Self-pay | Admitting: Physician Assistant

## 2017-08-01 ENCOUNTER — Ambulatory Visit (INDEPENDENT_AMBULATORY_CARE_PROVIDER_SITE_OTHER): Payer: 59

## 2017-08-01 VITALS — BP 148/83 | HR 101 | Ht 70.0 in

## 2017-08-01 DIAGNOSIS — M549 Dorsalgia, unspecified: Secondary | ICD-10-CM

## 2017-08-01 DIAGNOSIS — R0781 Pleurodynia: Secondary | ICD-10-CM

## 2017-08-01 MED ORDER — PREDNISONE 50 MG PO TABS: ORAL_TABLET | ORAL | 0 refills | Status: DC

## 2017-08-01 NOTE — Progress Notes (Signed)
Subjective:    Patient ID: Aimee Little, female    DOB: [birth year], 56 y.o.   MRN: [id number]  HPI  Pt is a 56 yo pleasant female who presents to the clinic to discuss 2 weeks of left under breast pain through ribs that radiates into left middle back. She states "it feels like it is bruised". She denies any trauma to that area. She ihas not been coughing a lot. The pain was off and on but now it is with deep breathing and constant. Mammogram normal in June of this year. Lifting does now seem to bother it. She is having good bowel movements. No fever, chills, SOB, cough, wheezing. Just finished an abx for sinus infection.   .. Active Ambulatory Problems    Diagnosis Date Noted  . HOT FLASHES 12/29/2009  . Roy DISEASE, LUMBAR 12/29/2009  . Fibromyalgia 01/01/2013  . Unspecified vitamin D deficiency 05/16/2013  . Migraine with status migrainosus 07/21/2013  . Former smoker 11/05/2013  . Chronic pain syndrome 11/05/2013  . Anxiety and depression 12/17/2013  . Adenomatous colon polyp 12/20/2013  . Obesity 11/25/2014  . Other fatigue 05/26/2015  . Bilateral low back pain without sciatica 09/06/2015  . RLS (restless legs syndrome) 09/22/2015  . Thyroid activity decreased 10/27/2015  . Insomnia 11/21/2015  . Severe episode of recurrent major depressive disorder, without psychotic features ([place]) 11/29/2015  . Rhinitis, allergic 11/29/2015  . Vaginal atrophy 01/02/2016  . Left knee pain 01/23/2016  . Panic attacks 03/19/2016  . Loose body in knee 04/17/2016  . Chondromalacia of patellofemoral joint 04/17/2016  . Diverticulitis of colon without hemorrhage 07/19/2016  . Trochanteric bursitis of left hip 01/29/2017  . Nonintractable headache 03/06/2017  . Tachycardia 03/06/2017  . Rib pain on left side 08/03/2017   Resolved Ambulatory Problems    Diagnosis Date Noted  . Unspecified hypothyroidism 12/29/2009  . TOBACCO ABUSE 06/13/2010  . GRIEF REACTION, ACUTE 12/29/2009  .  Depression 02/06/2010  . Osteoarthrosis involving lower leg 12/29/2009  . NAUSEA AND VOMITING 05/02/2010  . Osteoarthritis of left knee 07/21/2013  . Medication management 11/05/2013  . Nausea with vomiting 04/07/2014  . Lumbago 11/25/2014  . Sinusitis 04/13/2015  . Sacroiliac joint dysfunction of both sides 09/06/2015  . Right knee pain 11/07/2015  . GAD (generalized anxiety disorder) 03/19/2016  . Abdominal pain, left lower quadrant 04/25/2016   Past Medical History:  Diagnosis Date  . DDD (degenerative disc disease), lumbar   . Depression   . Fibromyalgia   . Insomnia   . Thyroid disease       Review of Systems  All other systems reviewed and are negative.      Objective:   Physical Exam  Constitutional: She is oriented to person, place, and time. She appears well-developed and well-nourished.  HENT:  Head: Normocephalic and atraumatic.  Cardiovascular: Normal rate, regular rhythm and normal heart sounds.  Pulmonary/Chest: Effort normal and breath sounds normal. She has no wheezes.  No tenderness to palpation over ribs on left side.  No tenderness over left breast.  Nipple inverted but has always been that way.   No CVA tenderness.   Neurological: She is alert and oriented to person, place, and time.  Psychiatric: She has a normal mood and affect. Her behavior is normal.          Assessment & Plan:  Marland KitchenMarland KitchenDiagnoses and all orders for this visit:  Rib pain on left side -     Cancel: DG Ribs Bilateral  W/Chest -     predniSONE (DELTASONE) 50 MG tablet; Take one tablet for 5 days. -     DG Ribs Unilateral W/Chest Left -     DG Ribs Unilateral W/Chest Left  Other acute back pain -     Cancel: DG Ribs Bilateral W/Chest -     DG Ribs Unilateral W/Chest Left   Ordered xray to look at ribs.  Suspect some inflammation of cartilage. Prednisone burst given. Consider icing area and taking NSAIDs.  Follow up if pain persist.  Does not appear to be GI related.Marland Kitchen

## 2017-08-01 NOTE — Patient Instructions (Signed)
Costochondritis Costochondritis is swelling and irritation (inflammation) of the tissue (cartilage) that connects your ribs to your breastbone (sternum). This causes pain in the front of your chest. Usually, the pain:  Starts gradually.  Is in more than one rib.  This condition usually goes away on its own over time. Follow these instructions at home:  Do not do anything that makes your pain worse.  If directed, put ice on the painful area: ? Put ice in a plastic bag. ? Place a towel between your skin and the bag. ? Leave the ice on for 20 minutes, 2-3 times a day.  If directed, put heat on the affected area as often as told by your doctor. Use the heat source that your doctor tells you to use, such as a moist heat pack or a heating pad. ? Place a towel between your skin and the heat source. ? Leave the heat on for 20-30 minutes. ? Take off the heat if your skin turns bright red. This is very important if you cannot feel pain, heat, or cold. You may have a greater risk of getting burned.  Take over-the-counter and prescription medicines only as told by your doctor.  Return to your normal activities as told by your doctor. Ask your doctor what activities are safe for you.  Keep all follow-up visits as told by your doctor. This is important. Contact a doctor if:  You have chills or a fever.  Your pain does not go away or it gets worse.  You have a cough that does not go away. Get help right away if:  You are short of breath. This information is not intended to replace advice given to you by your health care provider. Make sure you discuss any questions you have with your health care provider. Document Released: 03/04/2008 Document Revised: 04/05/2016 Document Reviewed: 01/10/2016 Elsevier Interactive Patient Education  2018 Elsevier Inc.  

## 2017-08-03 DIAGNOSIS — R0781 Pleurodynia: Secondary | ICD-10-CM | POA: Insufficient documentation

## 2017-08-04 ENCOUNTER — Ambulatory Visit (INDEPENDENT_AMBULATORY_CARE_PROVIDER_SITE_OTHER): Payer: 59 | Admitting: Family Medicine

## 2017-08-04 ENCOUNTER — Encounter: Payer: Self-pay | Admitting: Family Medicine

## 2017-08-04 VITALS — BP 141/69 | HR 83 | Wt 214.0 lb

## 2017-08-04 DIAGNOSIS — N644 Mastodynia: Secondary | ICD-10-CM | POA: Diagnosis not present

## 2017-08-04 DIAGNOSIS — R091 Pleurisy: Secondary | ICD-10-CM

## 2017-08-04 NOTE — Progress Notes (Signed)
Aimee Little is a 56 y.o. female who presents to [facility] today for follow-up left chest wall pain. Patient was seen several days ago for left chest wall and breast pain thought to be pleuritis. Rib x-ray showed a possible old 6th rib fracture but otherwise was normal. Kareem denies any shortness of breath or cough. She notes pain in the left side of her chest wall with deep inspiration. She has the pain radiates to her breast. She denies any skin change breast mass nipple discharge or injury. She has tried her tramadol and gabapentin which has not helped very much to control her pain. She was prescribed a prednisone course on the second which has not been very helpful yet.   Past Medical History:  Diagnosis Date  . DDD (degenerative disc disease), lumbar   . Depression   . Fibromyalgia   . Insomnia   . Thyroid disease    Past Surgical History:  Procedure Laterality Date  . ABDOMINAL HYSTERECTOMY     took uterus and both ovaries left   Social History   Tobacco Use  . Smoking status: Former Smoker    Packs/day: 0.50    Last attempt to quit: 10/02/2013    Years since quitting: 3.8  . Smokeless tobacco: Never Used  Substance Use Topics  . Alcohol use: Not on file   family history includes Alcohol abuse in her brother; Depression in her mother; Diabetes in her sister; Heart attack in her father; Hyperlipidemia in her mother; Hypertension in her brother, mother, and sister.  ROS as above:  Medications: Current Outpatient Medications  Medication Sig Dispense Refill  . AMBULATORY NON FORMULARY MEDICATION Compression stockings, please fit to size, pressure 20-70mmHg one pair one refill. Dx Venous Insufficiency 1 Units 1  . baclofen (LIORESAL) 10 MG tablet TAKE ONE TABLET BY MOUTH 3 TIMES A DAY 270 tablet 1  . botulinum toxin Type A (BOTOX) 100 UNITS SOLR  injection Inject 100 Units into the muscle.    . busPIRone (BUSPAR) 15 MG tablet TAKE ONE TABLET BY MOUTH 2 TIMES A DAY 60 tablet 1  . dexlansoprazole (DEXILANT) 60 MG capsule Take 1 capsule (60 mg total) by mouth daily. 30 capsule 6  . diazepam (VALIUM) 2 MG tablet Take 1 tablet (2 mg total) by mouth every 12 (twelve) hours as needed for anxiety. 60 tablet 5  . diclofenac sodium (VOLTAREN) 1 % GEL Apply 4 g topically 4 (four) times daily. To affected joint. 100 g 11  . estradiol (ESTRACE VAGINAL) 0.1 MG/GM vaginal cream Place 1 Applicatorful vaginally at bedtime. For 2 weeks. Then taper dose to 1-3 times a week. 42.5 g 5  . fluticasone (FLONASE) 50 MCG/ACT nasal spray USE 1 TO 2 SPRAYS IN EACH NOSTRIL ONCE DAILY. 16 g 6  . gabapentin (NEURONTIN) 300 MG capsule TAKE 2 CAPSULES BY MOUTH 3 TIMES A DAY AS NEEDED FOR PAIN/NEUROPATHY 180 capsule 5  . hydrOXYzine (ATARAX/VISTARIL) 25 MG tablet Take 1 tablet (25 mg total) by mouth 3 (three) times daily as needed for anxiety. 90 tablet 5  . ipratropium (ATROVENT) 0.06 % nasal spray Place 2 sprays into both nostrils 4 (four) times daily. 15 mL 3  . levothyroxine (SYNTHROID, LEVOTHROID) 150 MCG tablet TAKE ONE TABLET BY MOUTH EVERY DAY BEFORE BREAKFAST 90 tablet 3  . meloxicam (MOBIC) 15 MG tablet Take 1 tablet (15 mg total) by mouth daily. 90 tablet 1  .  metoCLOPramide (REGLAN) 10 MG tablet TAKE 1/2 TO 1 TABLET BY MOUTH EVERY 8 HOURS AS NEEDED FOR NAUSEA/HEADACHE. 90 tablet 1  . Omega 3-6-9 Fatty Acids (OMEGA 3-6-9 COMPLEX PO) Take by mouth.    . predniSONE (DELTASONE) 50 MG tablet Take one tablet for 5 days. 5 tablet 0  . ranitidine (ZANTAC) 300 MG tablet TAKE ONE TABLET BY MOUTH 2 TIMES A DAY 180 tablet 0  . rizatriptan (MAXALT-MLT) 10 MG disintegrating tablet TAKE ONE TABLET BY MOUTH AS NEEDED. MAY REPEAT IN 2 HOURS IF NEEDED. 10 tablet 5  . rOPINIRole (REQUIP) 1 MG tablet TAKE ONE TABLET BY MOUTH 3 TIMES A DAY 90 tablet 5  . topiramate (TOPAMAX) 100 MG  tablet TAKE ONE TABLET BY MOUTH 2 TIMES A DAY 60 tablet 5  . traMADol (ULTRAM) 50 MG tablet TAKE ONE to TWO TABLETs BY MOUTH EVERY  8-12 HOURS AS NEEDED FOR PAIN 120 tablet 2  . valACYclovir (VALTREX) 1000 MG tablet TAKE TWO TABLETS 2 TIMES A DAY AT ONSET OF COLD SORE FOR 2 DAYS 60 tablet 0  . Vitamin D, Ergocalciferol, (DRISDOL) 50000 units CAPS capsule Take 1 capsule (50,000 Units total) by mouth every 7 (seven) days. 12 capsule 4  . VITAMIN E PO Take by mouth.    . vortioxetine HBr (TRINTELLIX) 10 MG TABS Take 1 tablet (10 mg total) by mouth daily. 30 tablet 2   No current facility-administered medications for this visit.    Allergies  Allergen Reactions  . Doxepin Other (See Comments)  . Levothyroxine     Other reaction(s): Other (See Comments) Reaction unknown  . Mirapex [Pramipexole Dihydrochloride]     Nausea and vomiting  . Phentermine     Stomach ache/increased anxiety.   . Pregabalin   . Gabapentin     Sleep walking    Health Maintenance Health Maintenance  Topic Date Due  . Hepatitis C Screening  [birth year]  . HIV Screening  12/04/1974  . COLONOSCOPY  12/20/2016  . MAMMOGRAM  03/12/2019  . TETANUS/TDAP  01/02/2023  . INFLUENZA VACCINE  Completed     Exam:  BP (!) 141/69   Pulse 83   Wt 214 lb (97.1 kg)   BMI 30.71 kg/m  Gen: Well NAD HEENT: EOMI,  MMM Lungs: Normal work of breathing. CTABL Heart: RRR no MRG Abd: NABS, Soft. Nondistended, Nontender Exts: Brisk capillary refill, warm and well perfused.  Left breast is normal-appearing with no skin change. The nipple is inverted which is normal per patient history. The left breast and left lateral ribs are tender to touch. No masses palpated.    No results found for this or any previous visit (from the past 72 hour(s)). Dg Ribs Unilateral W/chest Left  Result Date: 08/04/2017 CLINICAL DATA:  Pleuritic left-sided chest wall pain for the past 4 weeks no report of injury. EXAM: LEFT RIBS AND CHEST - 3+ VIEW  COMPARISON:  Chest x-ray of December 09, 2014 FINDINGS: The lungs are adequately inflated. There is no pneumothorax, pneumomediastinum, or pleural effusion. The heart and mediastinal structures are normal. There is old deformity of the lateral aspect of the left sixth rib. No acute left rib fracture is observed. Deep to the metallic BB no bony abnormality is demonstrated. IMPRESSION: Old left lateral sixth rib fracture. No acute rib fracture is observed. No acute cardiopulmonary abnormality. Electronically Signed   By: David  Martinique M.D.   On: 08/04/2017 07:28      Assessment and Plan: 56 y.o. female  with left breast and chest wall pain. Unclear etiology likely pleuritis.  Plan for continued workup with diagnostic mammogram and ultrasound of the left breast. Continue deep breathing exercises with an incentive spirometer to prevent atelectasis. Continue existing pain management. Follow-up with PCP in about 2 weeks or sooner if needed.   Orders Placed This Encounter  Procedures  . MM Digital Diagnostic Unilat L    Standing Status:   Future    Standing Expiration Date:   10/04/2018    Order Specific Question:   Reason for Exam (SYMPTOM  OR DIAGNOSIS REQUIRED)    Answer:   eval left breast pain    Order Specific Question:   Is the patient pregnant?    Answer:   No    Order Specific Question:   Preferred imaging location?    Answer:   [facility]  . US BREAST COMPLETE UNI LEFT INC AXILLA    Standing Status:   Future    Standing Expiration Date:   10/04/2018    Order Specific Question:   Reason for Exam (SYMPTOM  OR DIAGNOSIS REQUIRED)    Answer:   eval left breast pain    Order Specific Question:   Preferred imaging location?    Answer:   [facility]   No orders of the defined types were placed in this encounter.    Discussed warning signs or symptoms. Please see discharge instructions. Patient expresses understanding.

## 2017-08-04 NOTE — Patient Instructions (Signed)
Thank you for coming in today. Use the incentive spirometer.  Recheck with [name] in 2 weeks.  Finish course of steroids.   We will arrange for a diagnostic mammogram and ultrasound.  You will hear about scheduling soon.  Let me know if you do not hear anything.

## 2017-08-07 ENCOUNTER — Telehealth: Payer: Self-pay | Admitting: Physician Assistant

## 2017-08-07 ENCOUNTER — Other Ambulatory Visit: Payer: Self-pay

## 2017-08-07 DIAGNOSIS — N644 Mastodynia: Secondary | ICD-10-CM

## 2017-08-07 NOTE — Telephone Encounter (Signed)
Pt notified of recommendations

## 2017-08-07 NOTE — Telephone Encounter (Signed)
Patient called adv that she is 35% better, still moving around from her breast to arm. What should she do give it more time for medicine to work (prednisone) Do she need blood work done she is not sure? She said hasnt sleep in 7 days due to prednisone me?

## 2017-08-07 NOTE — Telephone Encounter (Signed)
Yes prednisone can make you not sleep. I would give prednisone a little more time to work. Continue with ice and ibuprofen and rest. Let me know how you are doing in 5 more days.

## 2017-08-12 ENCOUNTER — Other Ambulatory Visit: Payer: 59

## 2017-08-18 ENCOUNTER — Other Ambulatory Visit: Payer: Self-pay | Admitting: Physician Assistant

## 2017-08-19 ENCOUNTER — Other Ambulatory Visit: Payer: Self-pay | Admitting: *Deleted

## 2017-08-19 ENCOUNTER — Telehealth: Payer: Self-pay | Admitting: Physician Assistant

## 2017-08-19 MED ORDER — DEXLANSOPRAZOLE 60 MG PO CPDR: 1.0000 | DELAYED_RELEASE_CAPSULE | Freq: Every day | ORAL | 1 refills | Status: DC

## 2017-08-19 NOTE — Telephone Encounter (Signed)
Patient is requesting a 90 day supply for Dexilant it is cheaper for pt to get it that way instead of 30 sent to pharmacy. Pt is needing a refill asap. Pt already has a 30 day supply did not need another 30 day supply, needs it for 90 days. Thanks

## 2017-08-19 NOTE — Telephone Encounter (Signed)
Refill sent.

## 2017-08-20 ENCOUNTER — Telehealth: Payer: Self-pay | Admitting: Physician Assistant

## 2017-08-20 ENCOUNTER — Other Ambulatory Visit: Payer: Self-pay | Admitting: Physician Assistant

## 2017-08-20 DIAGNOSIS — R0781 Pleurodynia: Secondary | ICD-10-CM

## 2017-08-20 MED ORDER — PREDNISONE 50 MG PO TABS: ORAL_TABLET | ORAL | 0 refills | Status: DC

## 2017-08-20 NOTE — Progress Notes (Signed)
Pt called with worsening back pain. Hx of benefit with prednisone. Sent to pharmacy.

## 2017-08-20 NOTE — Telephone Encounter (Signed)
Called pt and lm to let her know. Thanks

## 2017-08-20 NOTE — Telephone Encounter (Signed)
Pt called and stated her back is acting up from being on her feet cooking and preparing for Thanksgiving and wants to know if you can send her something to [place] for the pain. She asked for prednisone or something you recommended. Thanks

## 2017-08-20 NOTE — Telephone Encounter (Signed)
I sent a prednisone burst. Last prednisone was 11/2. We have to be careful with frequency of prednisone.

## 2017-09-01 ENCOUNTER — Ambulatory Visit: Payer: 59

## 2017-09-10 ENCOUNTER — Encounter: Payer: Self-pay | Admitting: Physician Assistant

## 2017-09-10 ENCOUNTER — Ambulatory Visit (INDEPENDENT_AMBULATORY_CARE_PROVIDER_SITE_OTHER): Payer: 59 | Admitting: Physician Assistant

## 2017-09-10 VITALS — BP 153/77 | HR 125 | Temp 99.0°F | Wt 215.0 lb

## 2017-09-10 DIAGNOSIS — F329 Major depressive disorder, single episode, unspecified: Secondary | ICD-10-CM | POA: Diagnosis not present

## 2017-09-10 DIAGNOSIS — K5792 Diverticulitis of intestine, part unspecified, without perforation or abscess without bleeding: Secondary | ICD-10-CM | POA: Diagnosis not present

## 2017-09-10 DIAGNOSIS — F419 Anxiety disorder, unspecified: Secondary | ICD-10-CM | POA: Diagnosis not present

## 2017-09-10 DIAGNOSIS — F32A Depression, unspecified: Secondary | ICD-10-CM

## 2017-09-10 MED ORDER — BUSPIRONE HCL 15 MG PO TABS: ORAL_TABLET | ORAL | 1 refills | Status: DC

## 2017-09-10 MED ORDER — ESZOPICLONE 3 MG PO TABS: 3.0000 mg | ORAL_TABLET | Freq: Every day | ORAL | 1 refills | Status: DC

## 2017-09-10 MED ORDER — CIPROFLOXACIN HCL 500 MG PO TABS: 500.0000 mg | ORAL_TABLET | Freq: Two times a day (BID) | ORAL | 0 refills | Status: DC

## 2017-09-10 MED ORDER — ESCITALOPRAM OXALATE 5 MG PO TABS: 5.0000 mg | ORAL_TABLET | Freq: Every day | ORAL | 1 refills | Status: DC

## 2017-09-10 MED ORDER — METRONIDAZOLE 500 MG PO TABS: 500.0000 mg | ORAL_TABLET | Freq: Three times a day (TID) | ORAL | 0 refills | Status: DC

## 2017-09-10 NOTE — Progress Notes (Addendum)
Subjective:    Patient ID: Aimee Little, female    DOB: 02/04/1961, 56 y.o.   MRN: [id number]  HPI Patient is a 56 y/o female who presents for depression and diverticulitis.  Depression - Patient reports improvement in her thoughts and "brain fog" since starting Trintillex, however developed increased restless leg symptoms that were not improving. She tried weaning herself off of the Trintillex and had significant relief of her restless leg syndrome. She restarted her Cymbalta after stopping the Trintillex and has been taking 30mg  Cymbalta for the last 3 days, however had stopped this in the past because it "made her head crazy." She reports adverse reactions to Effexor and Seroquel, but that she tolerated Prozac well. She has had trouble sleeping lately and decreased appetite which she attributes to her depression and anxiety. She has tried Ambien in the past but reports no relief with this.  Diverticulitis - Patient has a history of diverticulitis, last episode 12/09/16. She reports 2 day history of LLQ pain and an elevated temperature. She reports eating prime rib on Saturday and Sunday and thinks that this may have exacerbated this episode. She also reports loose stools for the past 2 days. She has felt warm but has not checked fever.   .. Active Ambulatory Problems    Diagnosis Date Noted  . HOT FLASHES 12/29/2009  . [place] DISEASE, LUMBAR 12/29/2009  . Fibromyalgia 01/01/2013  . Unspecified vitamin D deficiency 05/16/2013  . Migraine with status migrainosus 07/21/2013  . Former smoker 11/05/2013  . Chronic pain syndrome 11/05/2013  . Anxiety and depression 12/17/2013  . Adenomatous colon polyp 12/20/2013  . Obesity 11/25/2014  . Other fatigue 05/26/2015  . Bilateral low back pain without sciatica 09/06/2015  . RLS (restless legs syndrome) 09/22/2015  . Thyroid activity decreased 10/27/2015  . Insomnia 11/21/2015  . Severe episode of recurrent major depressive disorder, without  psychotic features ([place]) 11/29/2015  . Rhinitis, allergic 11/29/2015  . Vaginal atrophy 01/02/2016  . Left knee pain 01/23/2016  . Panic attacks 03/19/2016  . Loose body in knee 04/17/2016  . Chondromalacia of patellofemoral joint 04/17/2016  . Diverticulitis of colon without hemorrhage 07/19/2016  . Trochanteric bursitis of left hip 01/29/2017  . Nonintractable headache 03/06/2017  . Tachycardia 03/06/2017  . Rib pain on left side 08/03/2017  . Diverticulitis 09/10/2017   Resolved Ambulatory Problems    Diagnosis Date Noted  . Unspecified hypothyroidism 12/29/2009  . TOBACCO ABUSE 06/13/2010  . GRIEF REACTION, ACUTE 12/29/2009  . Depression 02/06/2010  . Osteoarthrosis involving lower leg 12/29/2009  . NAUSEA AND VOMITING 05/02/2010  . Osteoarthritis of left knee 07/21/2013  . Medication management 11/05/2013  . Nausea with vomiting 04/07/2014  . Lumbago 11/25/2014  . Sinusitis 04/13/2015  . Sacroiliac joint dysfunction of both sides 09/06/2015  . Right knee pain 11/07/2015  . GAD (generalized anxiety disorder) 03/19/2016  . Abdominal pain, left lower quadrant 04/25/2016   Past Medical History:  Diagnosis Date  . DDD (degenerative disc disease), lumbar   . Depression   . Fibromyalgia   . Insomnia   . Thyroid disease       Review of Systems See HPI, all other systems reviewed are negative.    Objective:   Physical Exam  Constitutional: She is oriented to person, place, and time. She appears well-developed and well-nourished.  HENT:  Head: Normocephalic and atraumatic.  Cardiovascular: Normal rate, regular rhythm and normal heart sounds.  Pulmonary/Chest: Effort normal and breath sounds normal.  Abdominal:  Soft. Bowel sounds are normal. She exhibits no mass. There is tenderness in the left lower quadrant. There is no rebound and no guarding.  Neurological: She is alert and oriented to person, place, and time.  Skin: Skin is warm and dry.  Psychiatric: She has a  normal mood and affect. Her behavior is normal. Thought content normal.  Vitals reviewed.     Assessment & Plan:  ...Aimee Little was seen today for depression and questionable diverticulitis.  Diagnoses and all orders for this visit:  Diverticulitis  Anxiety and depression -     busPIRone (BUSPAR) 15 MG tablet; TAKE ONE TABLET BY MOUTH 2 TIMES A DAY  Other orders -     escitalopram (LEXAPRO) 5 MG tablet; Take 1 tablet (5 mg total) by mouth daily. -     ciprofloxacin (CIPRO) 500 MG tablet; Take 1 tablet (500 mg total) by mouth 2 (two) times daily. For 10 days. -     metroNIDAZOLE (FLAGYL) 500 MG tablet; Take 1 tablet (500 mg total) by mouth 3 (three) times daily. For 10 days. -     Eszopiclone 3 MG TABS; Take 1 tablet (3 mg total) by mouth at bedtime. Take immediately before bedtime   Anxiety & depression - Patient has a long history of anxiety and depression for which she has tried multiple medications with adverse reactions. Trintellix made her very jittery and restless leg syndrome worse, while Cymbalta mad her "head crazy." She tolerated Prozac well in the past so we discussed discussed other SSRIs such as Lexapro and Celexa and their action as SSRIs compared to Cymbalta as an SNRI. Will try Lexapro at low dose. Advised patient that we will start low and increase her dose gradually if she is not having relief of symptoms with the lower dose. -follow up in 4-6 weeks.   Diverticulitis - Patient has a history of diverticulitis, last episode 12/09/16. Due to this history, her recent onset of LLQ pain, diarrhea, and elevation in temperature we will treat for diverticulitis. Prescribed cipro BID and flagyl TID for 10 days. Advised patient that if her symptoms do not improve or worsen to follow-up for re-evaluation.  Difficulty sleeping - Patient has tried Ambien, Trazodone, Restoril, and melatonin in the past but reports that they were ineffective. Will try Lunesta 3mg  - advised patient to try  taking half the dose at first and then increase to full dose if it does not help with sleep. Advised patient to try melatonin with the Lifescape for additional support. Discussed establishing good sleep habits by not watching TV or using her phone close to bed time. Patient will follow-up in 6 weeks, if she is still having problems with sleep we will try Remeron.  Marland Kitchen.Spent 30 minutes with patient and greater than 50 percent of visit spent counseling patient regarding treatment plan.

## 2017-09-16 ENCOUNTER — Ambulatory Visit (INDEPENDENT_AMBULATORY_CARE_PROVIDER_SITE_OTHER): Payer: 59 | Admitting: Physician Assistant

## 2017-09-16 ENCOUNTER — Encounter: Payer: Self-pay | Admitting: Physician Assistant

## 2017-09-16 VITALS — BP 153/74 | HR 109 | Temp 98.5°F

## 2017-09-16 DIAGNOSIS — F329 Major depressive disorder, single episode, unspecified: Secondary | ICD-10-CM

## 2017-09-16 DIAGNOSIS — F32A Depression, unspecified: Secondary | ICD-10-CM

## 2017-09-16 DIAGNOSIS — B379 Candidiasis, unspecified: Secondary | ICD-10-CM

## 2017-09-16 DIAGNOSIS — F332 Major depressive disorder, recurrent severe without psychotic features: Secondary | ICD-10-CM | POA: Diagnosis not present

## 2017-09-16 DIAGNOSIS — F419 Anxiety disorder, unspecified: Secondary | ICD-10-CM

## 2017-09-16 MED ORDER — ALPRAZOLAM 0.5 MG PO TABS: 0.5000 mg | ORAL_TABLET | Freq: Every evening | ORAL | 1 refills | Status: DC | PRN

## 2017-09-16 MED ORDER — FLUCONAZOLE 150 MG PO TABS: 150.0000 mg | ORAL_TABLET | Freq: Every day | ORAL | 0 refills | Status: DC

## 2017-09-16 MED ORDER — DULOXETINE HCL 30 MG PO CPEP: 30.0000 mg | ORAL_CAPSULE | Freq: Every day | ORAL | 1 refills | Status: DC

## 2017-09-16 NOTE — Progress Notes (Signed)
Subjective:    Patient ID: Aimee Little, female    DOB: 08-Nov-1960, 56 y.o.   MRN: [id number]  HPI  Pt is a 56 yo female with recurrent MDD who presents to the clinic to discuss depression. We recently switched cymbalta for lexapro because she stated that cymbalta was causing brain fog. cymbalta does help with her depression. trintellix helped a lot but caused her RLS to be much worse. She feels like she did better with brain fog on cymbalta 30mg  but if she goes up to 60mg  she experiences more fog. She is concerned because lexapro has not been doing well for her depression and the holidays are here. She would like to go back on cymbalta 30mg  daily. She denies any SI/HC thoughts.   She request diflucan due to being on cipro/flagyl for recent diverticulitis. She denies any abdominal pain today or lose stools.   .. Active Ambulatory Problems    Diagnosis Date Noted  . HOT FLASHES 12/29/2009  . [place] DISEASE, LUMBAR 12/29/2009  . Fibromyalgia 01/01/2013  . Unspecified vitamin D deficiency 05/16/2013  . Migraine with status migrainosus 07/21/2013  . Former smoker 11/05/2013  . Chronic pain syndrome 11/05/2013  . Anxiety and depression 12/17/2013  . Adenomatous colon polyp 12/20/2013  . Obesity 11/25/2014  . Other fatigue 05/26/2015  . Bilateral low back pain without sciatica 09/06/2015  . RLS (restless legs syndrome) 09/22/2015  . Thyroid activity decreased 10/27/2015  . Insomnia 11/21/2015  . Severe episode of recurrent major depressive disorder, without psychotic features ([place]) 11/29/2015  . Rhinitis, allergic 11/29/2015  . Vaginal atrophy 01/02/2016  . Left knee pain 01/23/2016  . Panic attacks 03/19/2016  . Loose body in knee 04/17/2016  . Chondromalacia of patellofemoral joint 04/17/2016  . Diverticulitis of colon without hemorrhage 07/19/2016  . Trochanteric bursitis of left hip 01/29/2017  . Nonintractable headache 03/06/2017  . Tachycardia 03/06/2017  . Rib pain on left  side 08/03/2017  . Diverticulitis 09/10/2017   Resolved Ambulatory Problems    Diagnosis Date Noted  . Unspecified hypothyroidism 12/29/2009  . TOBACCO ABUSE 06/13/2010  . GRIEF REACTION, ACUTE 12/29/2009  . Depression 02/06/2010  . Osteoarthrosis involving lower leg 12/29/2009  . NAUSEA AND VOMITING 05/02/2010  . Osteoarthritis of left knee 07/21/2013  . Medication management 11/05/2013  . Nausea with vomiting 04/07/2014  . Lumbago 11/25/2014  . Sinusitis 04/13/2015  . Sacroiliac joint dysfunction of both sides 09/06/2015  . Right knee pain 11/07/2015  . GAD (generalized anxiety disorder) 03/19/2016  . Abdominal pain, left lower quadrant 04/25/2016   Past Medical History:  Diagnosis Date  . DDD (degenerative disc disease), lumbar   . Depression   . Fibromyalgia   . Insomnia   . Thyroid disease       Review of Systems  All other systems reviewed and are negative.      Objective:   Physical Exam  Constitutional: She is oriented to person, place, and time. She appears well-developed and well-nourished.  Cardiovascular: Regular rhythm.  Tachycardic.   Neurological: She is alert and oriented to person, place, and time.  Psychiatric:  Tearful.           Assessment & Plan:  Marland KitchenMarland KitchenDiagnoses and all orders for this visit:  Severe episode of recurrent major depressive disorder, without psychotic features ([place]) -     DULoxetine (CYMBALTA) 30 MG capsule; Take 1 capsule (30 mg total) by mouth daily. For depression.  Anxiety and depression -     ALPRAZolam (  XANAX) 0.5 MG tablet; Take 1 tablet (0.5 mg total) by mouth at bedtime as needed for anxiety.  Yeast infection -     fluconazole (DIFLUCAN) 150 MG tablet; Take 1 tablet (150 mg total) by mouth daily.   Stop lexapro. Start cymbalta 30mg  daily.  Xanax as needed.  HO given if she were to have suicidal thoughts.  Follow up in 4 weeks.

## 2017-09-20 ENCOUNTER — Other Ambulatory Visit: Payer: Self-pay | Admitting: Physician Assistant

## 2017-09-20 DIAGNOSIS — M797 Fibromyalgia: Secondary | ICD-10-CM

## 2017-09-20 DIAGNOSIS — G894 Chronic pain syndrome: Secondary | ICD-10-CM

## 2017-09-20 DIAGNOSIS — M545 Low back pain, unspecified: Secondary | ICD-10-CM

## 2017-09-20 DIAGNOSIS — M5137 Other intervertebral disc degeneration, lumbosacral region: Secondary | ICD-10-CM

## 2017-09-21 ENCOUNTER — Encounter: Payer: Self-pay | Admitting: Physician Assistant

## 2017-09-24 ENCOUNTER — Telehealth: Payer: Self-pay | Admitting: *Deleted

## 2017-09-24 ENCOUNTER — Other Ambulatory Visit: Payer: Self-pay | Admitting: Physician Assistant

## 2017-09-24 DIAGNOSIS — G894 Chronic pain syndrome: Secondary | ICD-10-CM

## 2017-09-24 DIAGNOSIS — M545 Low back pain, unspecified: Secondary | ICD-10-CM

## 2017-09-24 DIAGNOSIS — M797 Fibromyalgia: Secondary | ICD-10-CM

## 2017-09-24 DIAGNOSIS — M5137 Other intervertebral disc degeneration, lumbosacral region: Secondary | ICD-10-CM

## 2017-09-24 MED ORDER — TRAMADOL HCL 50 MG PO TABS: ORAL_TABLET | ORAL | 2 refills | Status: DC

## 2017-09-24 NOTE — Telephone Encounter (Signed)
Pre Authorization sent to cover my meds.[id number]

## 2017-09-24 NOTE — Progress Notes (Signed)
[place] controlled substance database reviewed with no concerns. Aimee Little

## 2017-09-24 NOTE — Telephone Encounter (Signed)
[id number] - [place] Case ID: [id number] Approvedtoday  Request Reference Number: [id number]. ESZOPICLONE TAB 3MG  is approved through 09/29/2018. For further questions, call [phone].  called pharmacy and left a message with them.

## 2017-09-30 ENCOUNTER — Other Ambulatory Visit: Payer: Self-pay | Admitting: Physician Assistant

## 2017-10-01 ENCOUNTER — Encounter: Payer: Self-pay | Admitting: Physician Assistant

## 2017-10-01 ENCOUNTER — Ambulatory Visit (INDEPENDENT_AMBULATORY_CARE_PROVIDER_SITE_OTHER): Payer: 59 | Admitting: Physician Assistant

## 2017-10-01 VITALS — BP 144/87 | HR 87 | Temp 98.1°F | Resp 16 | Ht 70.0 in | Wt 212.3 lb

## 2017-10-01 DIAGNOSIS — R030 Elevated blood-pressure reading, without diagnosis of hypertension: Secondary | ICD-10-CM

## 2017-10-01 DIAGNOSIS — J069 Acute upper respiratory infection, unspecified: Secondary | ICD-10-CM | POA: Diagnosis not present

## 2017-10-01 DIAGNOSIS — G43909 Migraine, unspecified, not intractable, without status migrainosus: Secondary | ICD-10-CM

## 2017-10-01 DIAGNOSIS — F17200 Nicotine dependence, unspecified, uncomplicated: Secondary | ICD-10-CM | POA: Diagnosis not present

## 2017-10-01 DIAGNOSIS — I10 Essential (primary) hypertension: Secondary | ICD-10-CM | POA: Diagnosis not present

## 2017-10-01 MED ORDER — ASPIRIN EC 81 MG PO TBEC: 81.0000 mg | DELAYED_RELEASE_TABLET | Freq: Every day | ORAL | 3 refills | Status: DC

## 2017-10-01 MED ORDER — BENZONATATE 200 MG PO CAPS: 200.0000 mg | ORAL_CAPSULE | Freq: Three times a day (TID) | ORAL | 0 refills | Status: DC | PRN

## 2017-10-01 MED ORDER — IPRATROPIUM BROMIDE 0.06 % NA SOLN: 1.0000 | Freq: Four times a day (QID) | NASAL | 0 refills | Status: DC | PRN

## 2017-10-01 MED ORDER — TOPIRAMATE 100 MG PO TABS: ORAL_TABLET | ORAL | 1 refills | Status: DC

## 2017-10-01 MED ORDER — LOSARTAN POTASSIUM 50 MG PO TABS: 50.0000 mg | ORAL_TABLET | Freq: Every day | ORAL | 1 refills | Status: DC

## 2017-10-01 NOTE — Addendum Note (Signed)
Addended by: Nelson Chimes E on: 10/01/2017 11:12 AM   Modules accepted: Level of Service

## 2017-10-01 NOTE — Patient Instructions (Addendum)
For your upper respiratory infection: - the mainstay of treatment of upper respiratory infections, sinusitis and bronchitis in the first 2 weeks is symptomatic management and rest. Symptoms can last 7-14 days.  - Atrovent nasal spray up to 4 times daily for congestion - Saline rinses / Netty pot - Tessalon every 8 hours as needed for cough - do not combine Tussin CF with Tylenol sinus. They both contain Phenylephrine, a nasal decongestant. I would recommend sticking with just the Tylenol sinus and follow the dosage instructions on the package carefully - avoid tobacco products, which prolong the duration of infection  For your blood pressure: - Start Losartan 1 tab every morning. Okay to take with your other medications - Start baby aspirin 81 mg to help prevent heart attack/stroke - Check blood pressure at home for the next 2 weeks - Check around the same time each day in a relaxed setting - Limit salt to <2000 mg/day - Follow DASH eating plan - limit alcohol to 1 standard drinks per day - avoid tobacco products - weight loss: 7% of current body weight - Follow-up in 2 weeks with [name]     Upper Respiratory Infection, Adult Most upper respiratory infections (URIs) are caused by a virus. A URI affects the nose, throat, and upper air passages. The most common type of URI is often called "the common cold." Follow these instructions at home:  Take medicines only as told by your doctor.  Gargle warm saltwater or take cough drops to comfort your throat as told by your doctor.  Use a warm mist humidifier or inhale steam from a shower to increase air moisture. This may make it easier to breathe.  Drink enough fluid to keep your pee (urine) clear or pale yellow.  Eat soups and other clear broths.  Have a healthy diet.  Rest as needed.  Go back to work when your fever is gone or your doctor says it is okay. ? You may need to stay home longer to avoid giving your URI to others. ? You  can also wear a face mask and wash your hands often to prevent spread of the virus.  Use your inhaler more if you have asthma.  Do not use any tobacco products, including cigarettes, chewing tobacco, or electronic cigarettes. If you need help quitting, ask your doctor. Contact a doctor if:  You are getting worse, not better.  Your symptoms are not helped by medicine.  You have chills.  You are getting more short of breath.  You have brown or red mucus.  You have yellow or brown discharge from your nose.  You have pain in your face, especially when you bend forward.  You have a fever.  You have puffy (swollen) neck glands.  You have pain while swallowing.  You have white areas in the back of your throat. Get help right away if:  You have very bad or constant: ? Headache. ? Ear pain. ? Pain in your forehead, behind your eyes, and over your cheekbones (sinus pain). ? Chest pain.  You have long-lasting (chronic) lung disease and any of the following: ? Wheezing. ? Long-lasting cough. ? Coughing up blood. ? A change in your usual mucus.  You have a stiff neck.  You have changes in your: ? Vision. ? Hearing. ? Thinking. ? Mood. This information is not intended to replace advice given to you by your health care provider. Make sure you discuss any questions you have with your health care  provider. Document Released: 03/04/2008 Document Revised: 05/19/2016 Document Reviewed: 12/22/2013 Elsevier Interactive Patient Education  2018 [employer].

## 2017-10-01 NOTE — Progress Notes (Signed)
HPI:                                                                Aimee Little is a 57 y.o. female who presents to [facility]: [place] today for cough and congestion  Sinusitis  This is a new problem. The current episode started in the past 7 days. The problem is unchanged. There has been no fever. The pain is moderate. Associated symptoms include chills, congestion, coughing, headaches (frontal), shortness of breath, sinus pressure and sneezing. Pertinent negatives include no ear pain, neck pain or sore throat. Past treatments include oral decongestants and acetaminophen (tussin CF, tylenol sinus).   Also requesting a 90-day supply of Topamax, which she takes for migraines. Unable to state how many migraines she is having per month. States she is doing better and feels the medication is helping.  Past Medical History:  Diagnosis Date  . DDD (degenerative disc disease), lumbar   . Depression   . Fibromyalgia   . Insomnia   . Migraine   . Thyroid disease   . Tobacco use    Past Surgical History:  Procedure Laterality Date  . ABDOMINAL HYSTERECTOMY     took uterus and both ovaries left   Social History   Tobacco Use  . Smoking status: Light Tobacco Smoker    Packs/day: 0.25    Last attempt to quit: 10/02/2013    Years since quitting: 4.0  . Smokeless tobacco: Never Used  Substance Use Topics  . Alcohol use: No    Frequency: Never   family history includes Alcohol abuse in her brother; Depression in her mother; Diabetes in her sister; Heart attack in her father; Hyperlipidemia in her mother; Hypertension in her brother, mother, and sister.  ROS: negative except as noted in the HPI  Medications: Current Outpatient Medications  Medication Sig Dispense Refill  . ALPRAZolam (XANAX) 0.5 MG tablet Take 1 tablet (0.5 mg total) by mouth at bedtime as needed for anxiety. 30 tablet 1  . aspirin EC 81 MG tablet Take 1 tablet (81 mg total)  by mouth daily. 90 tablet 3  . baclofen (LIORESAL) 10 MG tablet TAKE ONE TABLET BY MOUTH 3 TIMES A DAY 270 tablet 1  . benzonatate (TESSALON) 200 MG capsule Take 1 capsule (200 mg total) by mouth 3 (three) times daily as needed for cough. 30 capsule 0  . botulinum toxin Type A (BOTOX) 100 UNITS SOLR injection Inject 100 Units into the muscle.    . busPIRone (BUSPAR) 15 MG tablet TAKE ONE TABLET BY MOUTH 2 TIMES A DAY 180 tablet 1  . dexlansoprazole (DEXILANT) 60 MG capsule Take 1 capsule (60 mg total) by mouth daily. 90 capsule 1  . DULoxetine (CYMBALTA) 30 MG capsule Take 1 capsule (30 mg total) by mouth daily. For depression. 90 capsule 1  . Eszopiclone 3 MG TABS Take 1 tablet (3 mg total) by mouth at bedtime. Take immediately before bedtime 30 tablet 1  . fluticasone (FLONASE) 50 MCG/ACT nasal spray USE 1 TO 2 SPRAYS IN EACH NOSTRIL ONCE DAILY. 16 g 6  . gabapentin (NEURONTIN) 300 MG capsule TAKE 2 CAPSULES BY MOUTH 3 TIMES A DAY AS NEEDED FOR PAIN/NEUROPATHY 180 capsule 5  . hydrOXYzine (  ATARAX/VISTARIL) 25 MG tablet Take 1 tablet (25 mg total) by mouth 3 (three) times daily as needed for anxiety. 90 tablet 5  . ipratropium (ATROVENT) 0.06 % nasal spray Place 1 spray into both nostrils 4 (four) times daily as needed. 15 mL 0  . levothyroxine (SYNTHROID, LEVOTHROID) 150 MCG tablet TAKE ONE TABLET BY MOUTH EVERY DAY BEFORE BREAKFAST 90 tablet 3  . losartan (COZAAR) 50 MG tablet Take 1 tablet (50 mg total) by mouth daily. 30 tablet 1  . meloxicam (MOBIC) 15 MG tablet Take 1 tablet (15 mg total) by mouth daily. 90 tablet 1  . metoCLOPramide (REGLAN) 10 MG tablet TAKE 1/2 TO 1 TABLET BY MOUTH EVERY 8 HOURS AS NEEDED FOR NAUSEA/HEADACHE. 90 tablet 1  . Omega 3-6-9 Fatty Acids (OMEGA 3-6-9 COMPLEX PO) Take by mouth.    . ranitidine (ZANTAC) 300 MG tablet TAKE ONE TABLET BY MOUTH 2 TIMES A DAY 180 tablet 0  . rizatriptan (MAXALT-MLT) 10 MG disintegrating tablet TAKE ONE TABLET BY MOUTH AS NEEDED. MAY  REPEAT IN 2 HOURS IF NEEDED. 10 tablet 5  . rOPINIRole (REQUIP) 1 MG tablet TAKE ONE TABLET BY MOUTH 3 TIMES A DAY 90 tablet 5  . topiramate (TOPAMAX) 100 MG tablet TAKE ONE TABLET BY MOUTH 2 TIMES A DAY 180 tablet 1  . traMADol (ULTRAM) 50 MG tablet TAKE ONE to TWO TABLETs BY MOUTH EVERY  8-12 HOURS AS NEEDED FOR PAIN 120 tablet 2  . valACYclovir (VALTREX) 1000 MG tablet TAKE TWO TABLETS 2 TIMES A DAY AT ONSET OF COLD SORE FOR 2 DAYS 60 tablet 0  . Vitamin D, Ergocalciferol, (DRISDOL) 50000 units CAPS capsule Take 1 capsule (50,000 Units total) by mouth every 7 (seven) days. 12 capsule 4  . VITAMIN E PO Take by mouth.     No current facility-administered medications for this visit.    Allergies  Allergen Reactions  . Doxepin Other (See Comments)  . Estradiol Other (See Comments)    Burning   . Levothyroxine     Other reaction(s): Other (See Comments) Reaction unknown  . Mirapex [Pramipexole Dihydrochloride]     Nausea and vomiting  . Phentermine     Stomach ache/increased anxiety.   . Pregabalin   . Seroquel [Quetiapine Fumarate]     hallicinations  . Trintellix [Vortioxetine]     RLS/increased anxiety  . Gabapentin     Sleep walking       Objective:  BP (!) 144/87 (BP Location: Right Arm, Cuff Size: Normal)   Pulse 87   Temp 98.1 F (36.7 C) (Oral)   Resp 16   Ht 5\' 10"  (1.778 m)   Wt 212 lb 4.8 oz (96.3 kg)   SpO2 96%   BMI 30.46 kg/m  Gen:  alert, ill-appearing, not toxic-appearing, no distress, appropriate for age, obese female HEENT: head normocephalic without obvious abnormality, conjunctiva and cornea clear, right TM partially obstructed by cerumen otherwise clear, left TM clear, nasal mucosa edematous, oropharynx clear, no cervical adenopathy, neck supple, trachea midline Pulm: Normal work of breathing, normal phonation, clear to auscultation bilaterally, no wheezes, rales or rhonchi CV: Normal rate, regular rhythm, s1 and s2 distinct, no murmurs, clicks or  rubs  Neuro: alert and oriented x 3, no tremor MSK: extremities atraumatic, normal gait and station Skin: intact, no rashes on exposed skin, no cyanosis   No results found for this or any previous visit (from the past 72 hour(s)). No results found.    Assessment and  Plan: 57 y.o. female with   1. Acute upper respiratory infection - symptomatic management. Recommended she stop Tussin CF and follow dosing instructions of Tylenol sinus carefully as oral decongestants can raise BP and HR - ipratropium (ATROVENT) 0.06 % nasal spray; Place 1 spray into both nostrils 4 (four) times daily as needed.  Dispense: 15 mL; Refill: 0 - benzonatate (TESSALON) 200 MG capsule; Take 1 capsule (200 mg total) by mouth 3 (three) times daily as needed for cough.  Dispense: 30 capsule; Refill: 0  2. Migraine without status migrainosus, not intractable, unspecified migraine type - topiramate (TOPAMAX) 100 MG tablet; TAKE ONE TABLET BY MOUTH 2 TIMES A DAY  Dispense: 180 tablet; Refill: 1  3. Elevated blood pressure reading BP Readings from Last 3 Encounters:  10/01/17 (!) 144/87  09/16/17 (!) 153/74  09/10/17 (!) 153/77  - starting Losartan 50 mg daily. Likely will need to up-titrate. Recommend close follow-up with PCP in 2 weeks - counseled on therapeutic lifestyle changes - baby aspirin for primary prevention   4. Light tobacco smoker   Patient education and anticipatory guidance given Patient agrees with treatment plan Follow-up in 2 weeks with PCP for hypertension or sooner as needed if symptoms worsen or fail to improve  Darlyne Russian PA-C

## 2017-10-08 ENCOUNTER — Other Ambulatory Visit: Payer: Self-pay | Admitting: Physician Assistant

## 2017-10-14 ENCOUNTER — Telehealth: Payer: Self-pay

## 2017-10-14 MED ORDER — LISINOPRIL 20 MG PO TABS: 20.0000 mg | ORAL_TABLET | Freq: Every day | ORAL | 0 refills | Status: DC

## 2017-10-14 NOTE — Telephone Encounter (Signed)
Medication sent and patient advised.

## 2017-10-14 NOTE — Telephone Encounter (Signed)
Ok I added to intolerance list. Has she tried lisinopril in her past? If not start lisinopril 20mg  daily #30 NRF follow up in 2 weeks.

## 2017-10-14 NOTE — Telephone Encounter (Signed)
Losartan was prescribed by Aimee Little at the last visit. Since she has started the Losartan she has had itching. She reports a decrease in headaches. Please advise.

## 2017-10-17 ENCOUNTER — Ambulatory Visit (INDEPENDENT_AMBULATORY_CARE_PROVIDER_SITE_OTHER): Payer: 59 | Admitting: Physician Assistant

## 2017-10-17 ENCOUNTER — Encounter: Payer: Self-pay | Admitting: Physician Assistant

## 2017-10-17 VITALS — BP 128/75 | HR 87 | Ht 70.0 in | Wt 209.0 lb

## 2017-10-17 DIAGNOSIS — G894 Chronic pain syndrome: Secondary | ICD-10-CM | POA: Diagnosis not present

## 2017-10-17 DIAGNOSIS — I10 Essential (primary) hypertension: Secondary | ICD-10-CM | POA: Diagnosis not present

## 2017-10-17 DIAGNOSIS — F332 Major depressive disorder, recurrent severe without psychotic features: Secondary | ICD-10-CM | POA: Diagnosis not present

## 2017-10-17 DIAGNOSIS — F329 Major depressive disorder, single episode, unspecified: Secondary | ICD-10-CM | POA: Diagnosis not present

## 2017-10-17 DIAGNOSIS — F419 Anxiety disorder, unspecified: Secondary | ICD-10-CM

## 2017-10-17 DIAGNOSIS — F32A Depression, unspecified: Secondary | ICD-10-CM

## 2017-10-17 MED ORDER — RIZATRIPTAN BENZOATE 10 MG PO TBDP: ORAL_TABLET | ORAL | 1 refills | Status: DC

## 2017-10-17 MED ORDER — AMLODIPINE BESYLATE 5 MG PO TABS: 5.0000 mg | ORAL_TABLET | Freq: Every day | ORAL | 0 refills | Status: DC

## 2017-10-17 NOTE — Progress Notes (Signed)
d 

## 2017-10-18 NOTE — Progress Notes (Signed)
Subjective:    Patient ID: Quincy Simmonds Randol, female    DOB: [birth year], 57 y.o.   MRN: [id number]  HPI Pt is a 57 yo female who presents to the clinic to discuss BP medication. She was started on losartan and had itching all over her body. Resolved once she stopped medication. Lisinopril was given and she developed a dry cough. Resolved once she stopped. Her BP continues to be elevated. No CP. She continues to struggle with feeling down and no motivation. No SI/HC. She mentions her sister responding well to lopressor.   .. Active Ambulatory Problems    Diagnosis Date Noted  . HOT FLASHES 12/29/2009  . [place] DISEASE, LUMBAR 12/29/2009  . Fibromyalgia 01/01/2013  . Unspecified vitamin D deficiency 05/16/2013  . Migraine without status migrainosus, not intractable 07/21/2013  . Chronic pain syndrome 11/05/2013  . Anxiety and depression 12/17/2013  . Adenomatous colon polyp 12/20/2013  . Obesity 11/25/2014  . Other fatigue 05/26/2015  . Bilateral low back pain without sciatica 09/06/2015  . RLS (restless legs syndrome) 09/22/2015  . Thyroid activity decreased 10/27/2015  . Insomnia 11/21/2015  . Severe episode of recurrent major depressive disorder, without psychotic features ([place]) 11/29/2015  . Rhinitis, allergic 11/29/2015  . Vaginal atrophy 01/02/2016  . Left knee pain 01/23/2016  . Panic attacks 03/19/2016  . Loose body in knee 04/17/2016  . Chondromalacia of patellofemoral joint 04/17/2016  . Diverticulitis of colon without hemorrhage 07/19/2016  . Trochanteric bursitis of left hip 01/29/2017  . Nonintractable headache 03/06/2017  . Tachycardia 03/06/2017  . Rib pain on left side 08/03/2017  . Diverticulitis 09/10/2017  . Elevated blood pressure reading 10/01/2017  . Light tobacco smoker 10/01/2017  . Hypertension goal BP (blood pressure) < 130/80 10/01/2017   Resolved Ambulatory Problems    Diagnosis Date Noted  . Unspecified hypothyroidism 12/29/2009  . TOBACCO ABUSE  06/13/2010  . GRIEF REACTION, ACUTE 12/29/2009  . Depression 02/06/2010  . Osteoarthrosis involving lower leg 12/29/2009  . NAUSEA AND VOMITING 05/02/2010  . Osteoarthritis of left knee 07/21/2013  . Medication management 11/05/2013  . Nausea with vomiting 04/07/2014  . Lumbago 11/25/2014  . Sinusitis 04/13/2015  . Sacroiliac joint dysfunction of both sides 09/06/2015  . Right knee pain 11/07/2015  . GAD (generalized anxiety disorder) 03/19/2016  . Abdominal pain, left lower quadrant 04/25/2016   Past Medical History:  Diagnosis Date  . DDD (degenerative disc disease), lumbar   . Depression   . Fibromyalgia   . Insomnia   . Migraine   . Thyroid disease   . Tobacco use       Review of Systems  All other systems reviewed and are negative.      Objective:   Physical Exam  Constitutional: She is oriented to person, place, and time. She appears well-developed and well-nourished.  HENT:  Head: Normocephalic and atraumatic.  Cardiovascular: Normal rate, regular rhythm and normal heart sounds.  Pulmonary/Chest: Effort normal and breath sounds normal.  Neurological: She is alert and oriented to person, place, and time.  Psychiatric: She has a normal mood and affect. Her behavior is normal.          Assessment & Plan:  Marland KitchenMarland KitchenMelissa was seen today for hypertension, medication reaction and cough.  Diagnoses and all orders for this visit:  Hypertension goal BP (blood pressure) < 130/80 -     amLODipine (NORVASC) 5 MG tablet; Take 1 tablet (5 mg total) by mouth daily.  Anxiety and depression -  Ambulatory referral to Psychology  Chronic pain syndrome -     Ambulatory referral to Psychology  Severe episode of recurrent major depressive disorder, without psychotic features ([place]) -     Ambulatory referral to Psychology  Other orders -     rizatriptan (MAXALT-MLT) 10 MG disintegrating tablet; TAKE ONE TABLET BY MOUTH AS NEEDED. MAY REPEAT IN 2 HOURS IF  NEEDED.   Concerned lopressor would cause fatigue to be worse. Start with norvasc. Keep up with BP readings. Follow up in 2 weeks.   Concerned with persistent depression/anxiety. Pt agrees to counseling. She declines psychiatrist referral at this time.

## 2017-10-29 ENCOUNTER — Encounter: Payer: Self-pay | Admitting: Physician Assistant

## 2017-10-29 ENCOUNTER — Ambulatory Visit (INDEPENDENT_AMBULATORY_CARE_PROVIDER_SITE_OTHER): Payer: 59 | Admitting: Physician Assistant

## 2017-10-29 VITALS — BP 128/72 | HR 87 | Ht 70.0 in

## 2017-10-29 DIAGNOSIS — F331 Major depressive disorder, recurrent, moderate: Secondary | ICD-10-CM

## 2017-10-29 DIAGNOSIS — I10 Essential (primary) hypertension: Secondary | ICD-10-CM

## 2017-10-29 DIAGNOSIS — M797 Fibromyalgia: Secondary | ICD-10-CM | POA: Diagnosis not present

## 2017-10-29 DIAGNOSIS — M62830 Muscle spasm of back: Secondary | ICD-10-CM | POA: Diagnosis not present

## 2017-10-29 MED ORDER — METHYLPREDNISOLONE ACETATE 80 MG/ML IJ SUSP
80.0000 mg | Freq: Once | INTRAMUSCULAR | Status: AC
  Administered 2017-10-29: 80 mg via INTRAMUSCULAR

## 2017-10-29 MED ORDER — KETOROLAC TROMETHAMINE 60 MG/2ML IM SOLN
60.0000 mg | Freq: Once | INTRAMUSCULAR | Status: AC
  Administered 2017-10-29: 60 mg via INTRAMUSCULAR

## 2017-10-29 MED ORDER — CYCLOBENZAPRINE HCL 10 MG PO TABS: 10.0000 mg | ORAL_TABLET | Freq: Three times a day (TID) | ORAL | 0 refills | Status: DC | PRN

## 2017-10-29 MED ORDER — ESCITALOPRAM OXALATE 10 MG PO TABS: 10.0000 mg | ORAL_TABLET | Freq: Every day | ORAL | 1 refills | Status: DC

## 2017-10-30 NOTE — Progress Notes (Signed)
Subjective:    Patient ID: Aimee Little, female    DOB: [birth year], 57 y.o.   MRN: [id number]  HPI Pt is a 57 yo female with chronic back pain, MDD and HTN that presents to the clinic with flare of low back pain. 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 She denies any overt injury. She has baclofen but not helping. Taking mobic but does not seem to help. Denies any radicular pain, saddle anesthesia, bowel or bladder dysfunction. Request prednisone and toradol.   HTN- just started norvac 5mg  daily. Denies any CP, palpitations, vision changes.   MDD- she is 6 days off cymbalta. She just "couldn't do it anymore". She wants to start lexapro. She had tried to start if over christmas but she now reports is ready. She currently feels "crazy in the head".   .. Active Ambulatory Problems    Diagnosis Date Noted  . HOT FLASHES 12/29/2009  . [place] DISEASE, LUMBAR 12/29/2009  . Fibromyalgia 01/01/2013  . Unspecified vitamin D deficiency 05/16/2013  . Migraine without status migrainosus, not intractable 07/21/2013  . Chronic pain syndrome 11/05/2013  . Anxiety and depression 12/17/2013  . Adenomatous colon polyp 12/20/2013  . Obesity 11/25/2014  . Other fatigue 05/26/2015  . Bilateral low back pain without sciatica 09/06/2015  . RLS (restless legs syndrome) 09/22/2015  . Thyroid activity decreased 10/27/2015  . Insomnia 11/21/2015  . Severe episode of recurrent major depressive disorder, without psychotic features ([place]) 11/29/2015  . Rhinitis, allergic 11/29/2015  . Vaginal atrophy 01/02/2016  . Left knee pain 01/23/2016  . Panic attacks 03/19/2016  . Loose body in knee 04/17/2016  . Chondromalacia of patellofemoral joint 04/17/2016  . Diverticulitis of colon without hemorrhage 07/19/2016  . Trochanteric bursitis of left hip 01/29/2017  . Nonintractable headache 03/06/2017  . Tachycardia 03/06/2017  . Rib pain on left side  08/03/2017  . Diverticulitis 09/10/2017  . Elevated blood pressure reading 10/01/2017  . Light tobacco smoker 10/01/2017  . Hypertension goal BP (blood pressure) < 130/80 10/01/2017   Resolved Ambulatory Problems    Diagnosis Date Noted  . Unspecified hypothyroidism 12/29/2009  . TOBACCO ABUSE 06/13/2010  . GRIEF REACTION, ACUTE 12/29/2009  . Depression 02/06/2010  . Osteoarthrosis involving lower leg 12/29/2009  . NAUSEA AND VOMITING 05/02/2010  . Osteoarthritis of left knee 07/21/2013  . Medication management 11/05/2013  . Nausea with vomiting 04/07/2014  . Lumbago 11/25/2014  . Sinusitis 04/13/2015  . Sacroiliac joint dysfunction of both sides 09/06/2015  . Right knee pain 11/07/2015  . GAD (generalized anxiety disorder) 03/19/2016  . Abdominal pain, left lower quadrant 04/25/2016   Past Medical History:  Diagnosis Date  . DDD (degenerative disc disease), lumbar   . Depression   . Fibromyalgia   . Insomnia   . Migraine   . Thyroid disease   . Tobacco use       Review of Systems  All other systems reviewed and are negative.      Objective:   Physical Exam  Constitutional: She is oriented to person, place, and time. She appears well-developed and well-nourished.  HENT:  Head: Normocephalic and atraumatic.  Cardiovascular: Normal rate, regular rhythm and normal heart sounds.  Pulmonary/Chest: Effort normal and breath sounds normal.  Musculoskeletal:  No tenderness over lumbar spine to palpation.  Very tight bilateral lumbar paraspinal muscles to palpation.  Negative straight leg.   Neurological: She is alert and oriented  to person, place, and time.  Psychiatric: She has a normal mood and affect. Her behavior is normal.          Assessment & Plan:  Marland KitchenMarland KitchenMelissa was seen today for hypertension and back pain.  Diagnoses and all orders for this visit:  Lumbar paraspinal muscle spasm -     cyclobenzaprine (FLEXERIL) 10 MG tablet; Take 1 tablet (10 mg total)  by mouth 3 (three) times daily as needed for muscle spasms. -     ketorolac (TORADOL) injection 60 mg -     methylPREDNISolone acetate (DEPO-MEDROL) injection 80 mg  Hypertension goal BP (blood pressure) < 130/80  Moderate episode of recurrent major depressive disorder (HCC) -     escitalopram (LEXAPRO) 10 MG tablet; Take 1 tablet (10 mg total) by mouth daily.  Fibromyalgia   IM injections given. Stop baclofen. Start flexeril. Discussed low back exercises and working on core. Strongly encouraged regular massages. Follow up as needed.   2nd recheck BP was great. Stay on same dose of norvasc.   Discussed trying pristiq instead of lexapro. Pt wanted lexapro. Restarted. Follow up in 4 to 6 weeks.   Marland Kitchen.Spent 30 minutes with patient and greater than 50 percent of visit spent counseling patient regarding treatment plan.

## 2017-11-04 ENCOUNTER — Encounter: Payer: Self-pay | Admitting: Physician Assistant

## 2017-11-04 ENCOUNTER — Ambulatory Visit (INDEPENDENT_AMBULATORY_CARE_PROVIDER_SITE_OTHER): Payer: 59 | Admitting: Physician Assistant

## 2017-11-04 VITALS — BP 136/58 | HR 93 | Ht 70.0 in | Wt 208.0 lb

## 2017-11-04 DIAGNOSIS — F332 Major depressive disorder, recurrent severe without psychotic features: Secondary | ICD-10-CM | POA: Diagnosis not present

## 2017-11-04 DIAGNOSIS — M797 Fibromyalgia: Secondary | ICD-10-CM

## 2017-11-04 DIAGNOSIS — W19XXXA Unspecified fall, initial encounter: Secondary | ICD-10-CM

## 2017-11-04 DIAGNOSIS — K2100 Gastro-esophageal reflux disease with esophagitis, without bleeding: Secondary | ICD-10-CM

## 2017-11-04 DIAGNOSIS — M62830 Muscle spasm of back: Secondary | ICD-10-CM | POA: Diagnosis not present

## 2017-11-04 DIAGNOSIS — S0011XA Contusion of right eyelid and periocular area, initial encounter: Secondary | ICD-10-CM | POA: Insufficient documentation

## 2017-11-04 DIAGNOSIS — R0781 Pleurodynia: Secondary | ICD-10-CM | POA: Diagnosis not present

## 2017-11-04 MED ORDER — RANITIDINE HCL 300 MG PO TABS: ORAL_TABLET | ORAL | 1 refills | Status: DC

## 2017-11-04 MED ORDER — DESVENLAFAXINE SUCCINATE ER 50 MG PO TB24: 50.0000 mg | ORAL_TABLET | Freq: Every day | ORAL | 1 refills | Status: DC

## 2017-11-04 MED ORDER — KETOROLAC TROMETHAMINE 15.75 MG/SPRAY NA SOLN: 1.0000 "application " | Freq: Three times a day (TID) | NASAL | 5 refills | Status: DC | PRN

## 2017-11-04 MED ORDER — HYDROCODONE-ACETAMINOPHEN 5-325 MG PO TABS: 1.0000 | ORAL_TABLET | ORAL | 0 refills | Status: DC | PRN

## 2017-11-04 NOTE — Patient Instructions (Signed)
Rib Contusion A rib contusion is a deep bruise on your rib area. Contusions are the result of a blunt trauma that causes bleeding and injury to the tissues under the skin. A rib contusion may involve bruising of the ribs and of the skin and muscles in the area. The skin overlying the contusion may turn blue, purple, or yellow. Minor injuries will give you a painless contusion, but more severe contusions may stay painful and swollen for a few weeks. What are the causes? A contusion is usually caused by a blow, trauma, or direct force to an area of the body. This often occurs while playing contact sports. What are the signs or symptoms?  Swelling and redness of the injured area.  Discoloration of the injured area.  Tenderness and soreness of the injured area.  Pain with or without movement. How is this diagnosed? The diagnosis can be made by taking a medical history and performing a physical exam. An X-ray, CT scan, or MRI may be needed to determine if there were any associated injuries, such as broken bones (fractures) or internal injuries. How is this treated? Often, the best treatment for a rib contusion is rest. Icing or applying cold compresses to the injured area may help reduce swelling and inflammation. Deep breathing exercises may be recommended to reduce the risk of partial lung collapse and pneumonia. Over-the-counter or prescription medicines may also be recommended for pain control. Follow these instructions at home:  Apply ice to the injured area: ? Put ice in a plastic bag. ? Place a towel between your skin and the bag. ? Leave the ice on for 20 minutes, 2-3 times per day.  Take medicines only as directed by your health care provider.  Rest the injured area. Avoid strenuous activity and any activities or movements that cause pain. Be careful during activities and avoid bumping the injured area.  Perform deep-breathing exercises as directed by your health care provider.  Do  not lift anything that is heavier than 5 lb (2.3 kg) until your health care provider approves.  Do not use any tobacco products, including cigarettes, chewing tobacco, or electronic cigarettes. If you need help quitting, ask your health care provider. Contact a health care provider if:  You have increased bruising or swelling.  You have pain that is not controlled with treatment.  You have a fever. Get help right away if:  You have difficulty breathing or shortness of breath.  You develop a continual cough, or you cough up thick or bloody sputum.  You feel sick to your stomach (nauseous), you throw up (vomit), or you have abdominal pain. This information is not intended to replace advice given to you by your health care provider. Make sure you discuss any questions you have with your health care provider. Document Released: 06/11/2001 Document Revised: 02/22/2016 Document Reviewed: 06/28/2014 Elsevier Interactive Patient Education  2018 [employer]  

## 2017-11-04 NOTE — Progress Notes (Signed)
Subjective:    Patient ID: Aimee Little, female    DOB: June 01, 1961, 57 y.o.   MRN: [id number]  HPI  Patient is a 57 year old female with hypertension, migraines, MDD who presents to the clinic to follow-up after a fall 4 days ago.  She was at her house and she tripped over something in the floor and fell on an end table.  She denies any precluding dizziness.  The in table hit her in her right side and also her right eye.  She denies any eye pain.  She denies any vision changes.  Today she feels like the pain has intensified.  She does have a history of muscle spasms in her back.  She also mentions that she feels like her acid reflux is worsening a bit as well.  She is on Dexilant daily.  She has not taken her Mobic in a few days.  She is taking Tylenol but does not seem to be touching her right rib pain.  She denies any shortness of breath or wheezing.  She recently asked to start lexapro. She started but made her jittery. She wishes to try something else.    .. Active Ambulatory Problems    Diagnosis Date Noted  . HOT FLASHES 12/29/2009  . [place] DISEASE, LUMBAR 12/29/2009  . Fibromyalgia 01/01/2013  . Unspecified vitamin D deficiency 05/16/2013  . Migraine without status migrainosus, not intractable 07/21/2013  . Chronic pain syndrome 11/05/2013  . Anxiety and depression 12/17/2013  . Adenomatous colon polyp 12/20/2013  . Obesity 11/25/2014  . Other fatigue 05/26/2015  . Bilateral low back pain without sciatica 09/06/2015  . RLS (restless legs syndrome) 09/22/2015  . Thyroid activity decreased 10/27/2015  . Insomnia 11/21/2015  . Severe episode of recurrent major depressive disorder, without psychotic features ([place]) 11/29/2015  . Rhinitis, allergic 11/29/2015  . Vaginal atrophy 01/02/2016  . Left knee pain 01/23/2016  . Panic attacks 03/19/2016  . Loose body in knee 04/17/2016  . Chondromalacia of patellofemoral joint 04/17/2016  . Diverticulitis of colon without hemorrhage  07/19/2016  . Trochanteric bursitis of left hip 01/29/2017  . Nonintractable headache 03/06/2017  . Tachycardia 03/06/2017  . Rib pain on left side 08/03/2017  . Diverticulitis 09/10/2017  . Elevated blood pressure reading 10/01/2017  . Light tobacco smoker 10/01/2017  . Hypertension goal BP (blood pressure) < 130/80 10/01/2017  . Rib pain on right side 11/04/2017  . Traumatic hematoma of eyelid, right, initial encounter 11/04/2017   Resolved Ambulatory Problems    Diagnosis Date Noted  . Unspecified hypothyroidism 12/29/2009  . TOBACCO ABUSE 06/13/2010  . GRIEF REACTION, ACUTE 12/29/2009  . Depression 02/06/2010  . Osteoarthrosis involving lower leg 12/29/2009  . NAUSEA AND VOMITING 05/02/2010  . Osteoarthritis of left knee 07/21/2013  . Medication management 11/05/2013  . Nausea with vomiting 04/07/2014  . Lumbago 11/25/2014  . Sinusitis 04/13/2015  . Sacroiliac joint dysfunction of both sides 09/06/2015  . Right knee pain 11/07/2015  . GAD (generalized anxiety disorder) 03/19/2016  . Abdominal pain, left lower quadrant 04/25/2016   Past Medical History:  Diagnosis Date  . DDD (degenerative disc disease), lumbar   . Depression   . Fibromyalgia   . Insomnia   . Migraine   . Thyroid disease   . Tobacco use         Review of Systems    see HPI.  Objective:   Physical Exam  Constitutional: She is oriented to person, place, and time. She appears well-developed  and well-nourished.  HENT:  Head: Normocephalic and atraumatic.  Eyes: Conjunctivae and EOM are normal. Pupils are equal, round, and reactive to light. Right eye exhibits no discharge. Left eye exhibits no discharge.  Large ecchymosis of right eye. No significant swelling. Tenderness to palpation. conjunctiva clear.   Neck: Normal range of motion. Neck supple.  Cardiovascular: Normal rate, regular rhythm and normal heart sounds.  Pulmonary/Chest: Effort normal and breath sounds normal. She has no wheezes.   Musculoskeletal:  No bruising over right ribs. Tenderness to palpation along ribs 11 and 12. No step off.   Lymphadenopathy:    She has no cervical adenopathy.  Neurological: She is alert and oriented to person, place, and time.  Psychiatric: She has a normal mood and affect. Her behavior is normal.          Assessment & Plan:  Marland KitchenMarland KitchenDiagnoses and all orders for this visit:  Fall, initial encounter -     HYDROcodone-acetaminophen (NORCO/VICODIN) 5-325 MG tablet; Take 1 tablet by mouth every 4 (four) hours as needed for moderate pain.  Rib pain on right side -     HYDROcodone-acetaminophen (NORCO/VICODIN) 5-325 MG tablet; Take 1 tablet by mouth every 4 (four) hours as needed for moderate pain.  Traumatic hematoma of eyelid, right, initial encounter -     HYDROcodone-acetaminophen (NORCO/VICODIN) 5-325 MG tablet; Take 1 tablet by mouth every 4 (four) hours as needed for moderate pain.  Severe episode of recurrent major depressive disorder, without psychotic features ([place]) -     desvenlafaxine (PRISTIQ) 50 MG 24 hr tablet; Take 1 tablet (50 mg total) by mouth daily.  Fibromyalgia -     desvenlafaxine (PRISTIQ) 50 MG 24 hr tablet; Take 1 tablet (50 mg total) by mouth daily.  Gastroesophageal reflux disease with esophagitis -     ranitidine (ZANTAC) 300 MG tablet; TAKE ONE TABLET BY MOUTH 2 TIMES A DAY  Lumbar paraspinal muscle spasm -     Ketorolac Tromethamine 15.75 MG/SPRAY SOLN; Place 1 application into the nose every 8 (eight) hours as needed.   Discussed rib xray patient declined. Reassured likely even if fractured is nondisplaced and treatment is still conservative.  Strongly encouraged ice/mobic/rest. Discussed timeline of healing.  Reassurance given about bruising of eye. No red flags on exam.  Pt declined toradol in office today. rx given for sprix to use to keep her from needing to come into office for toradol shot. Sent to speciality pharmacy.  Small quanity of norco  sent for as needed pain. Viewed [employer] controlled substance database with no concerns today.   Add zantac to dexilant. If continues to have symptoms please follow up.   Replaced lexapro with pristiq. Discussed side effects. Start with 1/2 tablet daily. Follow up in 4 weeks.   Marland Kitchen.Spent 30 minutes with patient and greater than 50 percent of visit spent counseling patient regarding treatment plan.

## 2017-11-06 ENCOUNTER — Telehealth: Payer: Self-pay | Admitting: Physician Assistant

## 2017-11-06 NOTE — Telephone Encounter (Signed)
Pt called and stated she seen you the other day after she had fallen. She said that the fall has caused her to have a Fibro flare up and has caused her to be sore all over. She is wanting to know if you can send her some prednisone over to the [facility] for her. Thanks

## 2017-11-07 ENCOUNTER — Telehealth: Payer: Self-pay | Admitting: Physician Assistant

## 2017-11-07 MED ORDER — PREDNISONE 50 MG PO TABS: 50.0000 mg | ORAL_TABLET | Freq: Every day | ORAL | 0 refills | Status: DC

## 2017-11-07 NOTE — Telephone Encounter (Signed)
Rx sent Pt advised 

## 2017-11-07 NOTE — Telephone Encounter (Signed)
I thought that was the speciality pharmacy. I am guessing I have wrong speciality pharmacy. Can we call drug rep and confirm pharmacy. As I believe I have sent another patient this medication recently.

## 2017-11-07 NOTE — Addendum Note (Signed)
Addended by: Huel Cote on: 11/07/2017 10:45 AM   Modules accepted: Orders

## 2017-11-07 NOTE — Telephone Encounter (Signed)
[facility], advised they cannot fill the Rx for Ketorolac Tromethamine nasal spray. This would have to be sent to a speciality pharmacy. Will route.

## 2017-11-07 NOTE — Telephone Encounter (Signed)
Ok to send prednisone 50mg  one tablet once daily for 5 days. #5 NRF. I tried to send electronically but having epic problems.

## 2017-11-13 ENCOUNTER — Encounter: Payer: Self-pay | Admitting: Family Medicine

## 2017-11-13 ENCOUNTER — Ambulatory Visit (INDEPENDENT_AMBULATORY_CARE_PROVIDER_SITE_OTHER): Payer: 59 | Admitting: Family Medicine

## 2017-11-13 VITALS — BP 131/83 | HR 84 | Wt 209.0 lb

## 2017-11-13 DIAGNOSIS — I10 Essential (primary) hypertension: Secondary | ICD-10-CM | POA: Diagnosis not present

## 2017-11-13 DIAGNOSIS — M2342 Loose body in knee, left knee: Secondary | ICD-10-CM

## 2017-11-13 DIAGNOSIS — M25562 Pain in left knee: Secondary | ICD-10-CM

## 2017-11-13 DIAGNOSIS — M2242 Chondromalacia patellae, left knee: Secondary | ICD-10-CM | POA: Diagnosis not present

## 2017-11-13 MED ORDER — AMLODIPINE BESYLATE 5 MG PO TABS: 5.0000 mg | ORAL_TABLET | Freq: Every day | ORAL | 0 refills | Status: DC

## 2017-11-13 NOTE — Patient Instructions (Signed)
Thank you for coming in today.   Call or go to the ER if you develop a large red swollen joint with extreme pain or oozing puss.    Recheck as needed.    

## 2017-11-13 NOTE — Progress Notes (Signed)
Aimee Little is a 58 y.o. female who presents to [facility] today for left knee pain.  Aimee Little presents to clinic today for left knee pain.  She is been seen for this issue previously thought to be related to DJD.  She had a steroid injection in the left knee in September 2018 and did well until recently.  She is a pertinent recent medical history for a fall occurring on or around February 1.  She injured her face but denies any injury to her knee during the fall.  She was seen by her PCP on February 5 and is feeling a lot better today.  She notes worsening knee pain but denies any locking catching or giving way.  She has not tried much treatment for her knee recently.  Hypertension: Aimee Little takes medications for blood pressure listed below.  She is about to run out of amlodipine and needs a refill before tomorrow.  She tolerates her medications well denies chest pain or palpitations.   Past Medical History:  Diagnosis Date  . DDD (degenerative disc disease), lumbar   . Depression   . Fibromyalgia   . Insomnia   . Migraine   . Thyroid disease   . Tobacco use    Past Surgical History:  Procedure Laterality Date  . ABDOMINAL HYSTERECTOMY     took uterus and both ovaries left   Social History   Tobacco Use  . Smoking status: Light Tobacco Smoker    Packs/day: 0.25    Last attempt to quit: 10/02/2013    Years since quitting: 4.1  . Smokeless tobacco: Never Used  Substance Use Topics  . Alcohol use: No    Frequency: Never     ROS:  As above   Medications: Current Outpatient Medications  Medication Sig Dispense Refill  . ALPRAZolam (XANAX) 0.5 MG tablet Take 1 tablet (0.5 mg total) by mouth at bedtime as needed for anxiety. 30 tablet 1  . aspirin EC 81 MG tablet Take 1 tablet (81 mg total) by mouth daily. 90 tablet 3  . botulinum toxin Type A (BOTOX) 100 UNITS SOLR injection Inject 100 Units into the muscle.    . busPIRone  (BUSPAR) 15 MG tablet TAKE ONE TABLET BY MOUTH 2 TIMES A DAY 180 tablet 1  . cyclobenzaprine (FLEXERIL) 10 MG tablet Take 1 tablet (10 mg total) by mouth 3 (three) times daily as needed for muscle spasms. 30 tablet 0  . desvenlafaxine (PRISTIQ) 50 MG 24 hr tablet Take 1 tablet (50 mg total) by mouth daily. 30 tablet 1  . dexlansoprazole (DEXILANT) 60 MG capsule Take 1 capsule (60 mg total) by mouth daily. 90 capsule 1  . Eszopiclone 3 MG TABS Take 1 tablet (3 mg total) by mouth at bedtime. Take immediately before bedtime 30 tablet 1  . fluticasone (FLONASE) 50 MCG/ACT nasal spray USE 1 TO 2 SPRAYS IN EACH NOSTRIL ONCE DAILY. 16 g 6  . gabapentin (NEURONTIN) 300 MG capsule TAKE 2 CAPSULES BY MOUTH 3 TIMES A DAY AS NEEDED FOR PAIN/NEUROPATHY 180 capsule 5  . HYDROcodone-acetaminophen (NORCO/VICODIN) 5-325 MG tablet Take 1 tablet by mouth every 4 (four) hours as needed for moderate pain. 30 tablet 0  . hydrOXYzine (ATARAX/VISTARIL) 25 MG tablet Take 1 tablet (25 mg total) by mouth 3 (three) times daily as needed for anxiety. 90 tablet 5  . Ketorolac Tromethamine 15.75 MG/SPRAY SOLN Place 1 application into the nose every 8 (eight) hours  as needed. 1 each 5  . levothyroxine (SYNTHROID, LEVOTHROID) 150 MCG tablet TAKE ONE TABLET BY MOUTH EVERY DAY BEFORE BREAKFAST 90 tablet 3  . meloxicam (MOBIC) 15 MG tablet Take 1 tablet (15 mg total) by mouth daily. 90 tablet 1  . metoCLOPramide (REGLAN) 10 MG tablet TAKE 1/2 TO 1 TABLET BY MOUTH EVERY 8 HOURS AS NEEDED FOR NAUSEA/HEADACHE. 90 tablet 0  . Omega 3-6-9 Fatty Acids (OMEGA 3-6-9 COMPLEX PO) Take by mouth.    . ranitidine (ZANTAC) 300 MG tablet TAKE ONE TABLET BY MOUTH 2 TIMES A DAY 180 tablet 1  . rizatriptan (MAXALT-MLT) 10 MG disintegrating tablet TAKE ONE TABLET BY MOUTH AS NEEDED. MAY REPEAT IN 2 HOURS IF NEEDED. 30 tablet 1  . rOPINIRole (REQUIP) 1 MG tablet TAKE ONE TABLET BY MOUTH 3 TIMES A DAY 90 tablet 5  . topiramate (TOPAMAX) 100 MG tablet TAKE  ONE TABLET BY MOUTH 2 TIMES A DAY 180 tablet 1  . traMADol (ULTRAM) 50 MG tablet TAKE ONE to TWO TABLETs BY MOUTH EVERY  8-12 HOURS AS NEEDED FOR PAIN 120 tablet 2  . valACYclovir (VALTREX) 1000 MG tablet TAKE TWO TABLETS 2 TIMES A DAY AT ONSET OF COLD SORE FOR 2 DAYS 60 tablet 0  . Vitamin D, Ergocalciferol, (DRISDOL) 50000 units CAPS capsule Take 1 capsule (50,000 Units total) by mouth every 7 (seven) days. 12 capsule 4  . VITAMIN E PO Take by mouth.    Marland Kitchen amLODipine (NORVASC) 5 MG tablet Take 1 tablet (5 mg total) by mouth daily. 90 tablet 0   No current facility-administered medications for this visit.    Allergies  Allergen Reactions  . Doxepin Other (See Comments)  . Cymbalta [Duloxetine Hcl]     Dizzy/brain fog  . Estradiol Other (See Comments)    Burning   . Levothyroxine     Other reaction(s): Other (See Comments) Reaction unknown  . Lexapro [Escitalopram Oxalate]     jittery  . Lisinopril     cough  . Losartan     itching  . Mirapex [Pramipexole Dihydrochloride]     Nausea and vomiting  . Phentermine     Stomach ache/increased anxiety.   . Pregabalin   . Seroquel [Quetiapine Fumarate]     hallicinations  . Trintellix [Vortioxetine]     RLS/increased anxiety  . Gabapentin     Sleep walking     Exam:  BP 131/83   Pulse 84   Wt 209 lb (94.8 kg)   BMI 29.99 kg/m  General: Well Developed, well nourished, and in no acute distress.  Neuro/Psych: Alert and oriented x3, extra-ocular muscles intact, able to move all 4 extremities, sensation grossly intact. Skin: Warm and dry, no rashes noted.  Ecchymosis present underneath right orbit.  Face is symmetrical. Respiratory: Not using accessory muscles, speaking in full sentences, trachea midline.  Cardiovascular: Pulses palpable, no extremity edema. Abdomen: Does not appear distended. MSK:  Left knee mild effusion no erythema Range of motion 0-120 degrees. Tender to palpation medial joint line. Stable ligamentous  exam. Normal gait.  Procedure: Real-time Ultrasound Guided Injection of left knee Device: GE Logiq E  Images permanently stored and available for review in the ultrasound unit. Verbal informed consent obtained. Discussed risks and benefits of procedure. Warned about infection bleeding damage to structures skin hypopigmentation and fat atrophy among others. Patient expresses understanding and agreement Time-out conducted.  Noted no overlying erythema, induration, or other signs of local infection.  Skin prepped in  a sterile fashion.  Local anesthesia: Topical Ethyl chloride.  With sterile technique and under real time ultrasound guidance:80 mg of Kenalog and 4 mL of Marcaine injected easily.  Completed without difficulty  Pain immediately resolved suggesting accurate placement of the medication.  Advised to call if fevers/chills, erythema, induration, drainage, or persistent bleeding.  Images permanently stored and available for review in the ultrasound unit.  Impression: Technically successful ultrasound guided injection.      No results found for this or any previous visit (from the past 48 hour(s)). No results found.    Assessment and Plan: 57 y.o. female with left knee pain due to DJD.  Doubtful for any exacerbation with recent fall.  I do not think she has a fracture or other new injury.  Plan to treat with steroid injection as above and resume home exercise program.  Recheck as needed for this issue.  Hypertension: Ms. Swoboda notes that her hypertension is typically well controlled with the medications listed above.  She is due for refill of amlodipine and will run out tomorrow.  Her primary care provider in this office typically will refill this medication.  I will refill the medication today.    No orders of the defined types were placed in this encounter.  Meds ordered this encounter  Medications  . amLODipine (NORVASC) 5 MG tablet    Sig: Take 1 tablet (5 mg  total) by mouth daily.    Dispense:  90 tablet    Refill:  0    Discussed warning signs or symptoms. Please see discharge instructions. Patient expresses understanding.

## 2017-11-15 ENCOUNTER — Other Ambulatory Visit: Payer: Self-pay | Admitting: Physician Assistant

## 2017-11-18 ENCOUNTER — Other Ambulatory Visit: Payer: Self-pay | Admitting: Physician Assistant

## 2017-11-18 DIAGNOSIS — M62830 Muscle spasm of back: Secondary | ICD-10-CM

## 2017-11-18 MED ORDER — KETOROLAC TROMETHAMINE 15.75 MG/SPRAY NA SOLN: 1.0000 "application " | Freq: Three times a day (TID) | NASAL | 5 refills | Status: DC | PRN

## 2017-11-18 NOTE — Telephone Encounter (Signed)
Sent!

## 2017-11-18 NOTE — Telephone Encounter (Signed)
The Rx should go to:  [place]

## 2017-11-18 NOTE — Progress Notes (Signed)
Resent to special[facility] pharmacy.

## 2017-11-20 ENCOUNTER — Other Ambulatory Visit: Payer: Self-pay | Admitting: Physician Assistant

## 2017-11-21 ENCOUNTER — Telehealth: Payer: Self-pay

## 2017-11-21 ENCOUNTER — Ambulatory Visit: Payer: 59 | Admitting: Physician Assistant

## 2017-11-21 ENCOUNTER — Other Ambulatory Visit: Payer: Self-pay | Admitting: Physician Assistant

## 2017-11-21 DIAGNOSIS — M62830 Muscle spasm of back: Secondary | ICD-10-CM

## 2017-11-21 MED ORDER — KETOROLAC TROMETHAMINE 15.75 MG/SPRAY NA SOLN: 1.0000 "application " | Freq: Three times a day (TID) | NASAL | 5 refills | Status: DC | PRN

## 2017-11-21 MED ORDER — KETOROLAC TROMETHAMINE 10 MG PO TABS: 10.0000 mg | ORAL_TABLET | Freq: Four times a day (QID) | ORAL | 0 refills | Status: DC | PRN

## 2017-11-21 NOTE — Telephone Encounter (Signed)
Patient advised of new prescription 

## 2017-11-21 NOTE — Telephone Encounter (Signed)
Sent toradol. Do not take with other ibprofen, aleve, celebrex etc.

## 2017-11-21 NOTE — Telephone Encounter (Signed)
Aimee Little called and states she has fibromyalgia flare up. [name] doesn't have the Toradol nasal spray. [facility]. She would like Toradol tablets sent to [facility]. Please advise.

## 2017-11-24 ENCOUNTER — Ambulatory Visit: Payer: 59 | Admitting: Physician Assistant

## 2017-11-24 ENCOUNTER — Other Ambulatory Visit: Payer: Self-pay | Admitting: Physician Assistant

## 2017-11-24 DIAGNOSIS — F32A Depression, unspecified: Secondary | ICD-10-CM

## 2017-11-24 DIAGNOSIS — F329 Major depressive disorder, single episode, unspecified: Secondary | ICD-10-CM

## 2017-11-24 DIAGNOSIS — F419 Anxiety disorder, unspecified: Principal | ICD-10-CM

## 2017-11-24 NOTE — Addendum Note (Signed)
Addended by: Dessie Coma on: 11/24/2017 09:29 AM   Modules accepted: Orders

## 2017-11-24 NOTE — Telephone Encounter (Signed)
Received fax from [employer] that Toradol tablets required [place]. Awaiting determination.

## 2017-11-27 IMAGING — DX DG KNEE 1-2V*R*
4 series · 4 of 4 positions shown · non-contrast
Comparison: None.

CLINICAL DATA: Chronic knee pain without known injury.

EXAM:
RIGHT KNEE - 1-2 VIEW

[knee ap]
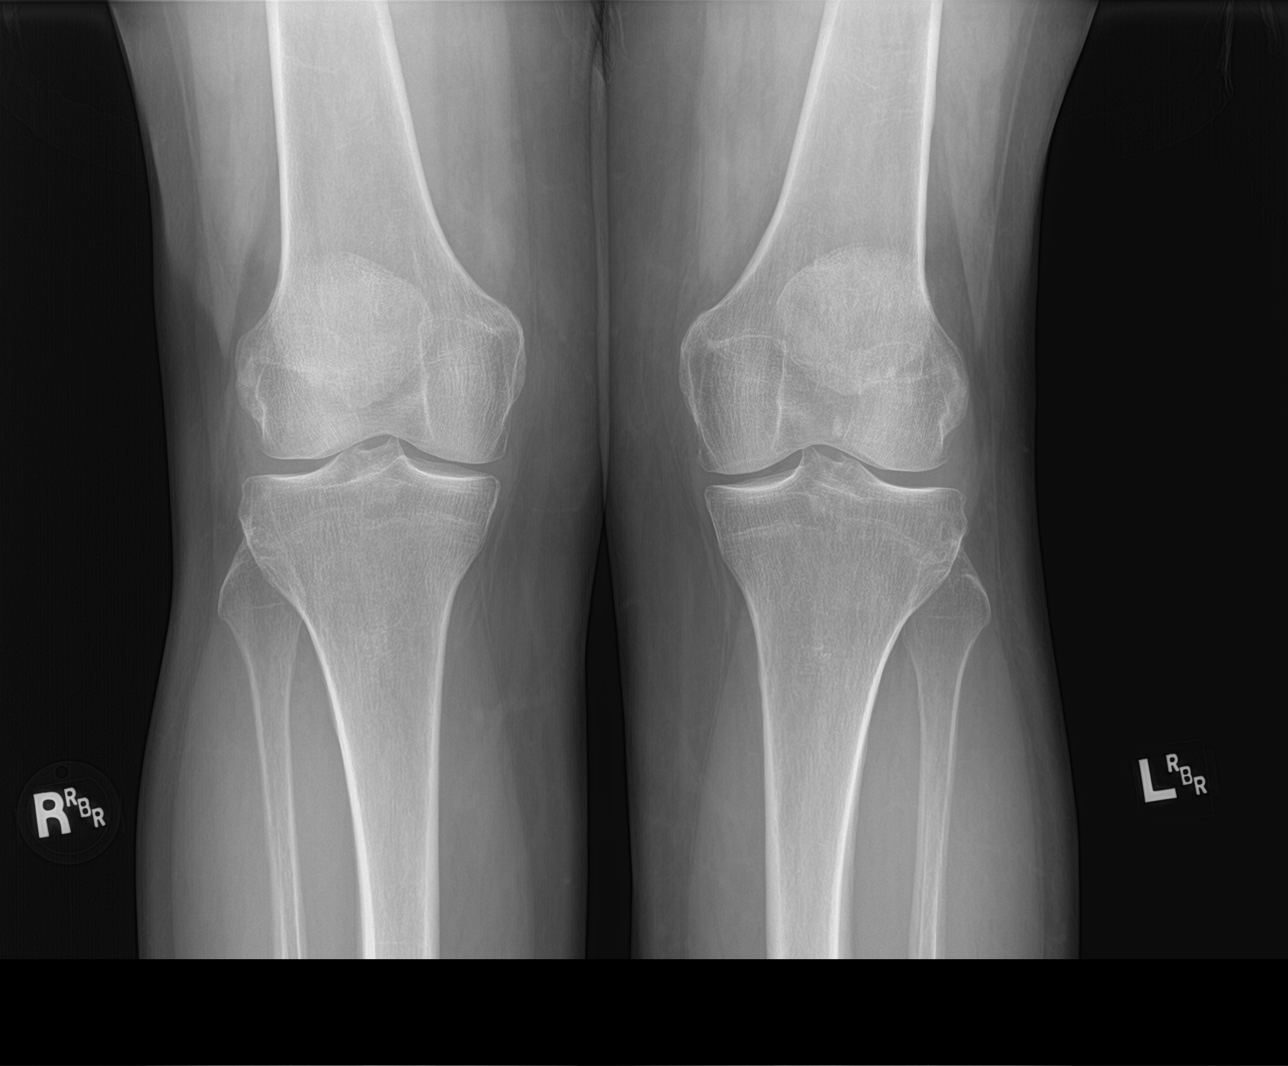

[tunnel]
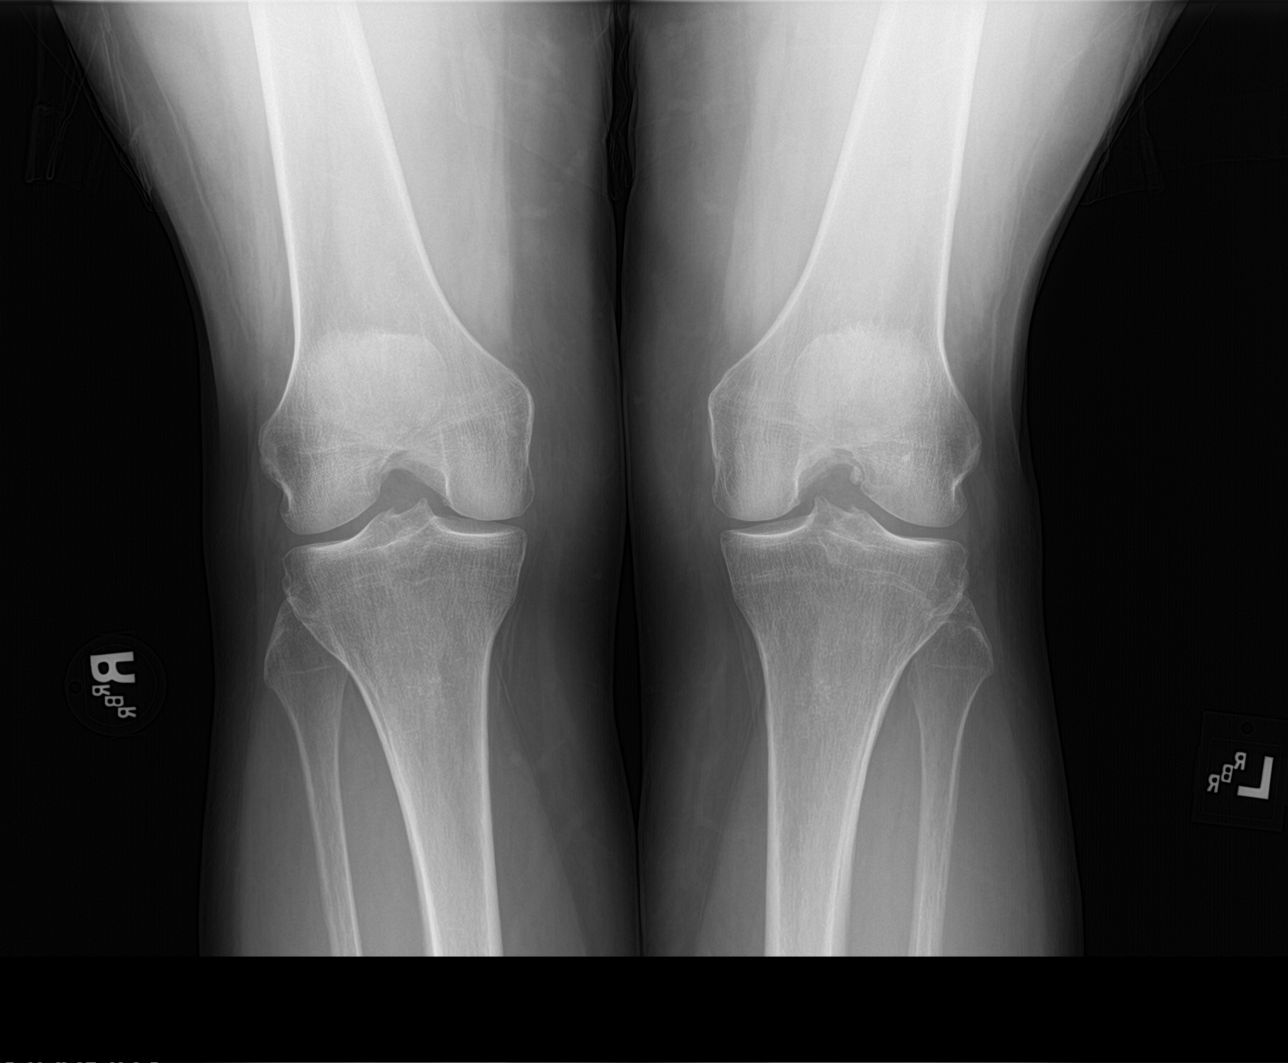

[knee lat]
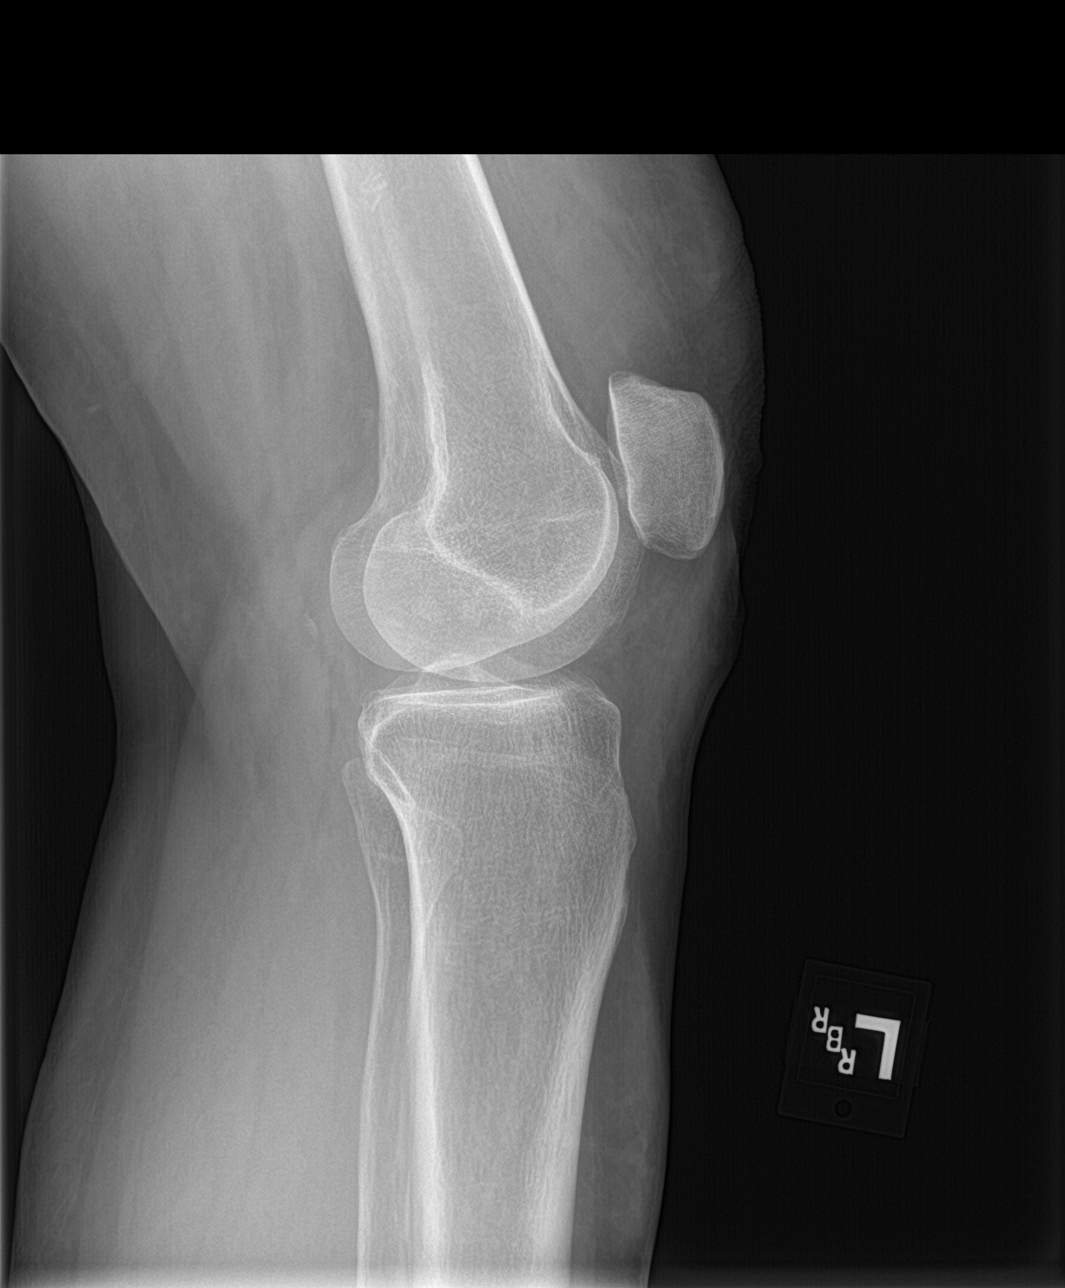

[knee sunrise]
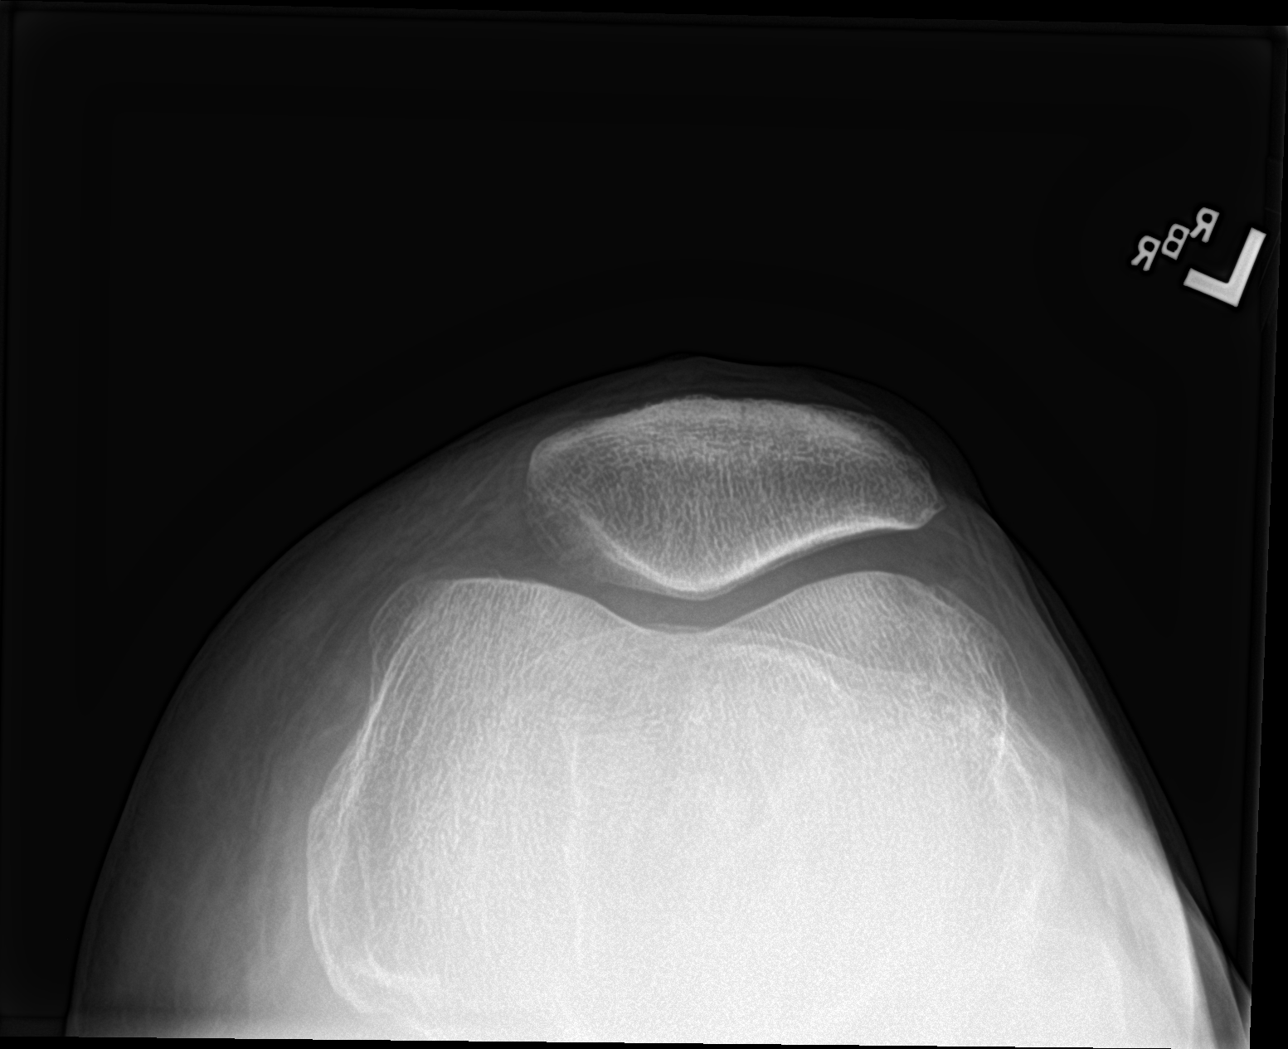

[4 of 4 positions shown; findings below may reference images not displayed]

FINDINGS: No evidence of fracture, dislocation, or joint effusion. No evidence
of arthropathy or other focal bone abnormality. Soft tissues are
unremarkable.
IMPRESSION: Normal right knee.

## 2017-12-02 ENCOUNTER — Other Ambulatory Visit: Payer: Self-pay | Admitting: Physician Assistant

## 2017-12-02 DIAGNOSIS — M62830 Muscle spasm of back: Secondary | ICD-10-CM

## 2017-12-08 ENCOUNTER — Other Ambulatory Visit: Payer: Self-pay | Admitting: Physician Assistant

## 2017-12-08 DIAGNOSIS — F331 Major depressive disorder, recurrent, moderate: Secondary | ICD-10-CM

## 2017-12-10 ENCOUNTER — Other Ambulatory Visit: Payer: Self-pay | Admitting: Physician Assistant

## 2017-12-19 ENCOUNTER — Telehealth: Payer: Self-pay

## 2017-12-19 MED ORDER — ESCITALOPRAM OXALATE 20 MG PO TABS: 20.0000 mg | ORAL_TABLET | Freq: Every day | ORAL | 0 refills | Status: DC

## 2017-12-19 NOTE — Telephone Encounter (Signed)
Arlyn called and states the Pristiq has caused increased depression. She switched back to the Lexapro 20 mg. She states the Lexapro is working and she would like a refill. Please advise.

## 2017-12-19 NOTE — Telephone Encounter (Signed)
Ok to refill lexapro 20mg  once daily #90 NRF

## 2017-12-19 NOTE — Telephone Encounter (Signed)
Medication sent and patient aware  

## 2017-12-30 ENCOUNTER — Other Ambulatory Visit: Payer: Self-pay | Admitting: Physician Assistant

## 2018-02-04 ENCOUNTER — Encounter: Payer: 59 | Admitting: Physician Assistant

## 2018-02-10 ENCOUNTER — Ambulatory Visit (INDEPENDENT_AMBULATORY_CARE_PROVIDER_SITE_OTHER): Payer: 59 | Admitting: Physician Assistant

## 2018-02-10 ENCOUNTER — Encounter: Payer: Self-pay | Admitting: Physician Assistant

## 2018-02-10 VITALS — BP 124/77 | HR 96 | Ht 70.0 in | Wt 208.0 lb

## 2018-02-10 DIAGNOSIS — I10 Essential (primary) hypertension: Secondary | ICD-10-CM | POA: Diagnosis not present

## 2018-02-10 DIAGNOSIS — G43909 Migraine, unspecified, not intractable, without status migrainosus: Secondary | ICD-10-CM

## 2018-02-10 DIAGNOSIS — K2100 Gastro-esophageal reflux disease with esophagitis, without bleeding: Secondary | ICD-10-CM

## 2018-02-10 DIAGNOSIS — M797 Fibromyalgia: Secondary | ICD-10-CM

## 2018-02-10 DIAGNOSIS — G2581 Restless legs syndrome: Secondary | ICD-10-CM

## 2018-02-10 DIAGNOSIS — R0683 Snoring: Secondary | ICD-10-CM

## 2018-02-10 DIAGNOSIS — F5101 Primary insomnia: Secondary | ICD-10-CM | POA: Diagnosis not present

## 2018-02-10 DIAGNOSIS — M62830 Muscle spasm of back: Secondary | ICD-10-CM

## 2018-02-10 DIAGNOSIS — M5137 Other intervertebral disc degeneration, lumbosacral region: Secondary | ICD-10-CM

## 2018-02-10 DIAGNOSIS — M545 Low back pain, unspecified: Secondary | ICD-10-CM

## 2018-02-10 DIAGNOSIS — F329 Major depressive disorder, single episode, unspecified: Secondary | ICD-10-CM

## 2018-02-10 DIAGNOSIS — N898 Other specified noninflammatory disorders of vagina: Secondary | ICD-10-CM

## 2018-02-10 DIAGNOSIS — G478 Other sleep disorders: Secondary | ICD-10-CM

## 2018-02-10 DIAGNOSIS — Z1159 Encounter for screening for other viral diseases: Secondary | ICD-10-CM

## 2018-02-10 DIAGNOSIS — F32A Depression, unspecified: Secondary | ICD-10-CM

## 2018-02-10 DIAGNOSIS — Z13220 Encounter for screening for lipoid disorders: Secondary | ICD-10-CM

## 2018-02-10 DIAGNOSIS — F332 Major depressive disorder, recurrent severe without psychotic features: Secondary | ICD-10-CM

## 2018-02-10 DIAGNOSIS — E039 Hypothyroidism, unspecified: Secondary | ICD-10-CM

## 2018-02-10 DIAGNOSIS — G894 Chronic pain syndrome: Secondary | ICD-10-CM

## 2018-02-10 DIAGNOSIS — Z131 Encounter for screening for diabetes mellitus: Secondary | ICD-10-CM

## 2018-02-10 DIAGNOSIS — E559 Vitamin D deficiency, unspecified: Secondary | ICD-10-CM

## 2018-02-10 DIAGNOSIS — F419 Anxiety disorder, unspecified: Secondary | ICD-10-CM

## 2018-02-10 DIAGNOSIS — M51379 Other intervertebral disc degeneration, lumbosacral region without mention of lumbar back pain or lower extremity pain: Secondary | ICD-10-CM

## 2018-02-10 LAB — WET PREP FOR TRICH, YEAST, CLUE
MICRO NUMBER:: 90586047
SPECIMEN QUALITY 3963: ADEQUATE

## 2018-02-10 MED ORDER — METOCLOPRAMIDE HCL 10 MG PO TABS: ORAL_TABLET | ORAL | 3 refills | Status: DC

## 2018-02-10 MED ORDER — RIZATRIPTAN BENZOATE 10 MG PO TBDP: ORAL_TABLET | ORAL | 1 refills | Status: DC

## 2018-02-10 MED ORDER — BUPROPION HCL ER (XL) 150 MG PO TB24: 150.0000 mg | ORAL_TABLET | ORAL | 0 refills | Status: DC

## 2018-02-10 MED ORDER — ROPINIROLE HCL 1 MG PO TABS: ORAL_TABLET | ORAL | 1 refills | Status: DC

## 2018-02-10 MED ORDER — TOPIRAMATE 100 MG PO TABS: ORAL_TABLET | ORAL | 1 refills | Status: DC

## 2018-02-10 MED ORDER — DEXLANSOPRAZOLE 60 MG PO CPDR: 1.0000 | DELAYED_RELEASE_CAPSULE | Freq: Every day | ORAL | 1 refills | Status: DC

## 2018-02-10 MED ORDER — GABAPENTIN 300 MG PO CAPS: ORAL_CAPSULE | ORAL | 4 refills | Status: DC

## 2018-02-10 MED ORDER — RANITIDINE HCL 300 MG PO TABS: ORAL_TABLET | ORAL | 1 refills | Status: DC

## 2018-02-10 MED ORDER — AMBULATORY NON FORMULARY MEDICATION: 0 refills | Status: DC

## 2018-02-10 MED ORDER — HYDROXYZINE HCL 25 MG PO TABS: 25.0000 mg | ORAL_TABLET | Freq: Three times a day (TID) | ORAL | 5 refills | Status: DC | PRN

## 2018-02-10 MED ORDER — TRAMADOL HCL 50 MG PO TABS: ORAL_TABLET | ORAL | 0 refills | Status: DC

## 2018-02-10 MED ORDER — BUSPIRONE HCL 15 MG PO TABS: ORAL_TABLET | ORAL | 1 refills | Status: DC

## 2018-02-10 MED ORDER — ALPRAZOLAM 0.5 MG PO TABS: 0.5000 mg | ORAL_TABLET | Freq: Every evening | ORAL | 2 refills | Status: DC | PRN

## 2018-02-10 MED ORDER — TRAZODONE HCL 100 MG PO TABS: 100.0000 mg | ORAL_TABLET | Freq: Every day | ORAL | 2 refills | Status: DC

## 2018-02-10 MED ORDER — MELOXICAM 15 MG PO TABS: 15.0000 mg | ORAL_TABLET | Freq: Every day | ORAL | 1 refills | Status: DC

## 2018-02-10 MED ORDER — CYCLOBENZAPRINE HCL 10 MG PO TABS: 10.0000 mg | ORAL_TABLET | Freq: Three times a day (TID) | ORAL | 0 refills | Status: DC | PRN

## 2018-02-10 MED ORDER — AMLODIPINE BESYLATE 5 MG PO TABS: 5.0000 mg | ORAL_TABLET | Freq: Every day | ORAL | 0 refills | Status: DC

## 2018-02-10 NOTE — Progress Notes (Deleted)
[facility] Restoril/remeron pyshitar

## 2018-02-10 NOTE — Progress Notes (Signed)
Subjective:    Patient ID: Aimee Little, female    DOB: 1960-12-31, 57 y.o.   MRN: [id number]  HPI  57 year old female presents for yearly physical. She also would like to discuss vaginal discharge, insomnia, and depression.  Vaginal discharge- this has been going on for the past 3 months. It was going on all day every day, however now it is occurring more intermittently. She has been applying coconut oil inside her vagina every night which has made it better but she is getting frustrated having to do this every night. She denies itchiness, redness, burning. She also does not have any discharge that shows up on her underwear.   Insomnia- she says that she has not slept for the past 3 nights despite taking up to 10 mg of Lunesta. Her son is a respiratory therapist and believes that she has sleep apnea because she snores all night long. She also reports that she wakes up at night intermittently and does not feel rested if she does sleep at night. She does not nap often. She does not exercise due to lack of energy from not sleeping. She limits her caffeine to only drinking coffee in the mornings. When she wakes up in the middle of the night she says her "mind won't let her sleep".   Depression- she recently started taking cymbalta again which has helped with her depression although she feels foggy, so every couple of days she will skip a dose. She states it also helps with her pain.  .. Active Ambulatory Problems    Diagnosis Date Noted  . HOT FLASHES 12/29/2009  . [place] DISEASE, LUMBAR 12/29/2009  . Fibromyalgia 01/01/2013  . Unspecified vitamin D deficiency 05/16/2013  . Migraine without status migrainosus, not intractable 07/21/2013  . Chronic pain syndrome 11/05/2013  . Anxiety and depression 12/17/2013  . Adenomatous colon polyp 12/20/2013  . Obesity 11/25/2014  . Other fatigue 05/26/2015  . Bilateral low back pain without sciatica 09/06/2015  . RLS (restless legs syndrome)  09/22/2015  . Thyroid activity decreased 10/27/2015  . Insomnia 11/21/2015  . Severe episode of recurrent major depressive disorder, without psychotic features ([place]) 11/29/2015  . Rhinitis, allergic 11/29/2015  . Vaginal atrophy 01/02/2016  . Left knee pain 01/23/2016  . Panic attacks 03/19/2016  . Loose body in knee 04/17/2016  . Chondromalacia of patellofemoral joint 04/17/2016  . Diverticulitis of colon without hemorrhage 07/19/2016  . Trochanteric bursitis of left hip 01/29/2017  . Nonintractable headache 03/06/2017  . Tachycardia 03/06/2017  . Rib pain on left side 08/03/2017  . Diverticulitis 09/10/2017  . Elevated blood pressure reading 10/01/2017  . Light tobacco smoker 10/01/2017  . Hypertension goal BP (blood pressure) < 130/80 10/01/2017  . Rib pain on right side 11/04/2017  . Traumatic hematoma of eyelid, right, initial encounter 11/04/2017   Resolved Ambulatory Problems    Diagnosis Date Noted  . Unspecified hypothyroidism 12/29/2009  . TOBACCO ABUSE 06/13/2010  . GRIEF REACTION, ACUTE 12/29/2009  . Depression 02/06/2010  . Osteoarthrosis involving lower leg 12/29/2009  . NAUSEA AND VOMITING 05/02/2010  . Osteoarthritis of left knee 07/21/2013  . Medication management 11/05/2013  . Nausea with vomiting 04/07/2014  . Lumbago 11/25/2014  . Sinusitis 04/13/2015  . Sacroiliac joint dysfunction of both sides 09/06/2015  . Right knee pain 11/07/2015  . GAD (generalized anxiety disorder) 03/19/2016  . Abdominal pain, left lower quadrant 04/25/2016   Past Medical History:  Diagnosis Date  . DDD (degenerative disc disease), lumbar   .  Depression   . Fibromyalgia   . Insomnia   . Migraine   . Thyroid disease   . Tobacco use    .Marland Kitchen Family History  Problem Relation Age of Onset  . Depression Mother   . Hyperlipidemia Mother   . Hypertension Mother   . Heart attack Father   . Hypertension Sister   . Diabetes Sister   . Alcohol abuse Brother   . Hypertension  Brother    .Marland Kitchen Social History   Socioeconomic History  . Marital status: Married    Spouse name: Not on file  . Number of children: Not on file  . Years of education: Not on file  . Highest education level: Not on file  Occupational History  . Not on file  Social Needs  . Financial resource strain: Not on file  . Food insecurity:    Worry: Not on file    Inability: Not on file  . Transportation needs:    Medical: Not on file    Non-medical: Not on file  Tobacco Use  . Smoking status: Light Tobacco Smoker    Packs/day: 0.25    Last attempt to quit: 10/02/2013    Years since quitting: 4.3  . Smokeless tobacco: Never Used  Substance and Sexual Activity  . Alcohol use: No    Frequency: Never  . Drug use: No  . Sexual activity: Never  Lifestyle  . Physical activity:    Days per week: Not on file    Minutes per session: Not on file  . Stress: Not on file  Relationships  . Social connections:    Talks on phone: Not on file    Gets together: Not on file    Attends religious service: Not on file    Active member of club or organization: Not on file    Attends meetings of clubs or organizations: Not on file    Relationship status: Not on file  . Intimate partner violence:    Fear of current or ex partner: Not on file    Emotionally abused: Not on file    Physically abused: Not on file    Forced sexual activity: Not on file  Other Topics Concern  . Not on file  Social History Narrative  . Not on file     Review of Systems  All other systems reviewed and are negative.      Objective:   Physical Exam  Constitutional: She is oriented to person, place, and time. She appears well-developed and well-nourished.  Patient lying down during history and physical exam.  HENT:  Head: Normocephalic and atraumatic.  Eyes: Conjunctivae and EOM are normal.  Cardiovascular: Normal rate, regular rhythm and normal heart sounds.  Pulmonary/Chest: Effort normal and breath sounds  normal.  Abdominal: Soft. Bowel sounds are normal.  Genitourinary: Vagina normal. No vaginal discharge found.  Genitourinary Comments: Small amount of white discharge noted in vaginal canal with speculum exam. No lesions or erythema noted of the vaginal canal, cervix, or external genitalia.   Neurological: She is alert and oriented to person, place, and time.  Skin: Skin is warm and dry.  Psychiatric: She has a normal mood and affect. Her behavior is normal. Thought content normal.          Assessment & Plan:  Marland KitchenMarland KitchenMelissa was seen today for annual exam.  Diagnoses and all orders for this visit:  Primary insomnia -     traZODone (DESYREL) 100 MG tablet; Take 1 tablet (100  mg total) by mouth at bedtime. -     Split night study -     Ambulatory referral to Psychiatry  Migraine without status migrainosus, not intractable, unspecified migraine type -     rizatriptan (MAXALT-MLT) 10 MG disintegrating tablet; TAKE ONE TABLET BY MOUTH AS NEEDED. MAY REPEAT IN 2 HOURS IF NEEDED. -     topiramate (TOPAMAX) 100 MG tablet; TAKE ONE TABLET BY MOUTH 2 TIMES A DAY  Lumbar paraspinal muscle spasm -     cyclobenzaprine (FLEXERIL) 10 MG tablet; Take 1 tablet (10 mg total) by mouth 3 (three) times daily as needed for muscle spasms.  Hypertension goal BP (blood pressure) < 130/80 -     amLODipine (NORVASC) 5 MG tablet; Take 1 tablet (5 mg total) by mouth daily.  Gastroesophageal reflux disease with esophagitis -     metoCLOPramide (REGLAN) 10 MG tablet; TAKE 1/2 TO 1 TABLET BY MOUTH EVERY 8 HOURS AS NEEDED FOR NAUSEA/HEADACHE. -     dexlansoprazole (DEXILANT) 60 MG capsule; Take 1 capsule (60 mg total) by mouth daily. -     ranitidine (ZANTAC) 300 MG tablet; TAKE ONE TABLET BY MOUTH 2 TIMES A DAY  Anxiety and depression -     hydrOXYzine (ATARAX/VISTARIL) 25 MG tablet; Take 1 tablet (25 mg total) by mouth 3 (three) times daily as needed for anxiety. -     busPIRone (BUSPAR) 15 MG tablet; TAKE ONE  TABLET BY MOUTH 2 TIMES A DAY -     ALPRAZolam (XANAX) 0.5 MG tablet; Take 1 tablet (0.5 mg total) by mouth at bedtime as needed for anxiety. -     Ambulatory referral to Psychiatry  Chronic pain syndrome -     meloxicam (MOBIC) 15 MG tablet; Take 1 tablet (15 mg total) by mouth daily. -     gabapentin (NEURONTIN) 300 MG capsule; TAKE 1 CAPSULES BY MOUTH 3 TIMES A DAY AS NEEDED FOR PAIN/NEUROPATHY -     traMADol (ULTRAM) 50 MG tablet; TAKE ONE to TWO TABLETs BY MOUTH EVERY  8-12 HOURS AS NEEDED FOR PAIN  DISC DISEASE, LUMBAR -     meloxicam (MOBIC) 15 MG tablet; Take 1 tablet (15 mg total) by mouth daily. -     gabapentin (NEURONTIN) 300 MG capsule; TAKE 1 CAPSULES BY MOUTH 3 TIMES A DAY AS NEEDED FOR PAIN/NEUROPATHY -     traMADol (ULTRAM) 50 MG tablet; TAKE ONE to TWO TABLETs BY MOUTH EVERY  8-12 HOURS AS NEEDED FOR PAIN  Acute bilateral low back pain without sciatica -     meloxicam (MOBIC) 15 MG tablet; Take 1 tablet (15 mg total) by mouth daily. -     gabapentin (NEURONTIN) 300 MG capsule; TAKE 1 CAPSULES BY MOUTH 3 TIMES A DAY AS NEEDED FOR PAIN/NEUROPATHY -     traMADol (ULTRAM) 50 MG tablet; TAKE ONE to TWO TABLETs BY MOUTH EVERY  8-12 HOURS AS NEEDED FOR PAIN  Fibromyalgia -     meloxicam (MOBIC) 15 MG tablet; Take 1 tablet (15 mg total) by mouth daily. -     gabapentin (NEURONTIN) 300 MG capsule; TAKE 1 CAPSULES BY MOUTH 3 TIMES A DAY AS NEEDED FOR PAIN/NEUROPATHY -     traMADol (ULTRAM) 50 MG tablet; TAKE ONE to TWO TABLETs BY MOUTH EVERY  8-12 HOURS AS NEEDED FOR PAIN  Screening for hypercholesterolemia -     Lipid Panel w/reflex Direct LDL  Vitamin D deficiency -     Vitamin D 1,25 dihydroxy  Screening for diabetes mellitus -     COMPLETE METABOLIC PANEL WITH GFR  Need for hepatitis C screening test -     Hepatitis C Antibody  Acquired hypothyroidism -     TSH  Vaginal discharge -     WET PREP FOR TRICH, YEAST, CLUE  RLS (restless legs syndrome) -      rOPINIRole (REQUIP) 1 MG tablet; TAKE ONE TABLET BY MOUTH 3 TIMES A DAY  Severe episode of recurrent major depressive disorder, without psychotic features ([facility]) -     buPROPion (WELLBUTRIN XL) 150 MG 24 hr tablet; Take 1 tablet (150 mg total) by mouth every morning. -     Ambulatory referral to Psychiatry  Snoring -     Split night study  Non-restorative sleep -     Split night study  Other orders -     AMBULATORY NON FORMULARY MEDICATION; shingrx 2 doses to prevent shingles.  .. Depression screen [facility] 2/9 09/16/2017 09/10/2017 05/20/2017 12/04/2016  Decreased Interest 3 2 3 1   Down, Depressed, Hopeless 3 2 3 1   PHQ - 2 Score 6 4 6 2   Altered sleeping 3 3 2 2   Tired, decreased energy 3 3 3 2   Change in appetite 3 3 2  0  Feeling bad or failure about yourself  2 3 1  0  Trouble concentrating 2 1 3  0  Moving slowly or fidgety/restless 2 2 3  0  Suicidal thoughts 0 1 0 0  PHQ-9 Score 21 20 20 6   Difficult doing work/chores Very difficult - - -   .Marland Kitchen GAD 7 : Generalized Anxiety Score 09/16/2017 09/10/2017  Nervous, Anxious, on Edge 3 2  Control/stop worrying 2 3  Worry too much - different things 2 3  Trouble relaxing 3 3  Restless 1 3  Easily annoyed or irritable 1 2  Afraid - awful might happen 3 1  Total GAD 7 Score 15 17      Health maintenance-  - Mammogram: had one last year, will get one again this year - Shingles vaccine: was given information and a handout about Shingrinx - she was given a prescription to take to her local pharmacy for this - Colonoscopy: had one earlier this year with [facility] and stated that they told her to follow-up in 10 years. - Pap smear: (s/p hysterectomy)  .Marland Kitchen Discussed 150 minutes of exercise a week.  Encouraged vitamin D 1000 units and Calcium 1300mg  or 4 servings of dairy a day.   Pt has side effects to cymbalta. Made psych referral. Started wellbutrin. Follow up in 4 weeks.   Discussed trying trazodone for sleep. Ordered sleep  study for snoring and waking up fatigued.

## 2018-02-11 ENCOUNTER — Telehealth: Payer: Self-pay | Admitting: Physician Assistant

## 2018-02-11 NOTE — Progress Notes (Signed)
Call pt: negative wet prep. No cause for "wetness". Thyroid normal. Kidney and liver look good. CV risk is 3.6 percent. No need for cholesterol treatment at this time. Continue with low fat diet.

## 2018-02-11 NOTE — Telephone Encounter (Signed)
[facility] need colonscopy.

## 2018-02-12 LAB — COMPLETE METABOLIC PANEL WITH GFR
AG RATIO: 1.9 (calc) (ref 1.0–2.5)
ALBUMIN MSPROF: 4.1 g/dL (ref 3.6–5.1)
ALKALINE PHOSPHATASE (APISO): 59 U/L (ref 33–130)
ALT: 14 U/L (ref 6–29)
AST: 16 U/L (ref 10–35)
BILIRUBIN TOTAL: 0.3 mg/dL (ref 0.2–1.2)
BUN: 16 mg/dL (ref 7–25)
CHLORIDE: 110 mmol/L (ref 98–110)
CO2: 22 mmol/L (ref 20–32)
Calcium: 9.9 mg/dL (ref 8.6–10.4)
Creat: 0.84 mg/dL (ref 0.50–1.05)
GFR, EST AFRICAN AMERICAN: 89 mL/min/{1.73_m2} (ref >=60)
GFR, Est Non African American: 77 mL/min/{1.73_m2} (ref >=60)
Globulin: 2.2 g/dL (calc) (ref 1.9–3.7)
Glucose, Bld: 94 mg/dL (ref 65–99)
POTASSIUM: 4.2 mmol/L (ref 3.5–5.3)
SODIUM: 140 mmol/L (ref 135–146)
TOTAL PROTEIN: 6.3 g/dL (ref 6.1–8.1)

## 2018-02-12 LAB — LIPID PANEL W/REFLEX DIRECT LDL
Cholesterol: 186 mg/dL (ref <200)
HDL: 47 mg/dL — AB (ref >50)
LDL CHOLESTEROL (CALC): 113 mg/dL — AB
Non-HDL Cholesterol (Calc): 139 mg/dL (calc) — ABNORMAL HIGH (ref <130)
TRIGLYCERIDES: 144 mg/dL (ref <150)
Total CHOL/HDL Ratio: 4 (calc) (ref <5.0)

## 2018-02-12 LAB — VITAMIN D 1,25 DIHYDROXY
VITAMIN D 1, 25 (OH) TOTAL: 30 pg/mL (ref 18–72)
Vitamin D2 1, 25 (OH)2: <8 pg/mL
Vitamin D3 1, 25 (OH)2: 30 pg/mL

## 2018-02-12 LAB — HEPATITIS C ANTIBODY
Hepatitis C Ab: NONREACTIVE
SIGNAL TO CUT-OFF: 0.03 (ref <1.00)

## 2018-02-12 LAB — TSH: TSH: 1.59 mIU/L (ref 0.40–4.50)

## 2018-02-12 NOTE — Telephone Encounter (Signed)
Fax request placed with front office.

## 2018-02-13 ENCOUNTER — Telehealth: Payer: Self-pay | Admitting: Physician Assistant

## 2018-02-13 NOTE — Telephone Encounter (Signed)
Aimee Little   I called Korin regarding her psych referral due to [place] does not take her insurance and she stated she will talk to you about that when she returns from vacation.  She also stated you put an order in for a sleep study and she does not go to [place] and would like to be referred to [facility] or [facility]. - CF

## 2018-02-13 NOTE — Telephone Encounter (Signed)
Thank you. You see the order for sleep study correct?

## 2018-02-16 ENCOUNTER — Other Ambulatory Visit: Payer: Self-pay | Admitting: Physician Assistant

## 2018-02-17 NOTE — Telephone Encounter (Signed)
Done

## 2018-02-19 ENCOUNTER — Telehealth: Payer: Self-pay

## 2018-02-19 NOTE — Telephone Encounter (Signed)
Pt called stating that [name] knws her HX and that she has been having back pain for about 3 days now.   Pt repots that she is going to the beach on Saturday but wants [name] to send her in some Prednisone.  Please advise.  Thanks!

## 2018-02-20 ENCOUNTER — Other Ambulatory Visit: Payer: Self-pay | Admitting: Physician Assistant

## 2018-02-20 MED ORDER — PREDNISONE 50 MG PO TABS: ORAL_TABLET | ORAL | 0 refills | Status: DC

## 2018-02-20 NOTE — Progress Notes (Signed)
Pt calls in with 3 days of back pain. She has hx of this and prednisone burst usually helps. She is headed out of town for the holiday. Will send burst of prednisone.

## 2018-02-20 NOTE — Telephone Encounter (Signed)
I did send a burst of prednisone.

## 2018-02-20 NOTE — Telephone Encounter (Signed)
Pt advised.

## 2018-02-25 ENCOUNTER — Encounter: Payer: Self-pay | Admitting: Physician Assistant

## 2018-03-27 ENCOUNTER — Telehealth: Payer: Self-pay

## 2018-03-27 DIAGNOSIS — F332 Major depressive disorder, recurrent severe without psychotic features: Secondary | ICD-10-CM

## 2018-03-27 DIAGNOSIS — F410 Panic disorder [episodic paroxysmal anxiety] without agoraphobia: Secondary | ICD-10-CM

## 2018-03-27 MED ORDER — FLUOXETINE HCL 20 MG PO TABS: 20.0000 mg | ORAL_TABLET | Freq: Every day | ORAL | 1 refills | Status: DC

## 2018-03-27 NOTE — Telephone Encounter (Signed)
Called patient and she said she would do behavioral health but if takes weeks to get in then she will need something until then. Patient states that Prozac 20mg  works good for her. Advised pt I would have [place] call that in. [place]. KG LPN

## 2018-03-27 NOTE — Telephone Encounter (Signed)
Pt called- has had episodes of crying a lot. Pt states she is okay, not at all suicidal nor anything of that sort- just crying a lot.   Pt states wellbutrin did not help and she switched back to Cymbalta. She is not having any improvement on Cymbalta and is requesting [name] start her on Celexa.  I advised pt to make an appt and she got very tearful and states she cannot make and appt today, but will try to make one next week. Wants to know if Luvenia Starch can go ahead and switch her and send RX to [facility].   Please advise

## 2018-03-27 NOTE — Telephone Encounter (Signed)
I dont mind trying different medications however if lexapro did not work then likely celexa is not since they are very similar. How does she feel about a referral to behavioral health?

## 2018-03-27 NOTE — Telephone Encounter (Signed)
Done

## 2018-04-01 ENCOUNTER — Encounter: Payer: Self-pay | Admitting: Physician Assistant

## 2018-04-01 ENCOUNTER — Ambulatory Visit (INDEPENDENT_AMBULATORY_CARE_PROVIDER_SITE_OTHER): Payer: 59 | Admitting: Physician Assistant

## 2018-04-01 VITALS — BP 138/75 | HR 111 | Temp 100.1°F | Resp 16 | Ht 70.0 in | Wt 215.0 lb

## 2018-04-01 DIAGNOSIS — M5442 Lumbago with sciatica, left side: Secondary | ICD-10-CM

## 2018-04-01 DIAGNOSIS — M5136 Other intervertebral disc degeneration, lumbar region: Secondary | ICD-10-CM | POA: Diagnosis not present

## 2018-04-01 DIAGNOSIS — M5441 Lumbago with sciatica, right side: Secondary | ICD-10-CM | POA: Diagnosis not present

## 2018-04-01 DIAGNOSIS — R509 Fever, unspecified: Secondary | ICD-10-CM

## 2018-04-01 DIAGNOSIS — M51369 Other intervertebral disc degeneration, lumbar region without mention of lumbar back pain or lower extremity pain: Secondary | ICD-10-CM | POA: Insufficient documentation

## 2018-04-01 LAB — POCT URINALYSIS DIPSTICK
Bilirubin, UA: NEGATIVE
Glucose, UA: NEGATIVE
KETONES UA: NEGATIVE
LEUKOCYTES UA: NEGATIVE
NITRITE UA: NEGATIVE
Protein, UA: NEGATIVE
RBC UA: NEGATIVE
SPEC GRAV UA: 1.02 (ref 1.010–1.025)
Urobilinogen, UA: 0.2 E.U./dL
pH, UA: 6 (ref 5.0–8.0)

## 2018-04-01 MED ORDER — METHYLPREDNISOLONE SODIUM SUCC 125 MG IJ SOLR
125.0000 mg | Freq: Once | INTRAMUSCULAR | Status: AC
  Administered 2018-04-01: 125 mg via INTRAMUSCULAR

## 2018-04-01 MED ORDER — PREDNISONE 50 MG PO TABS
ORAL_TABLET | ORAL | 0 refills | Status: DC
Start: 2018-04-01 — End: 2018-05-01

## 2018-04-01 MED ORDER — KETOROLAC TROMETHAMINE 60 MG/2ML IM SOLN
60.0000 mg | Freq: Once | INTRAMUSCULAR | Status: AC
  Administered 2018-04-01: 60 mg via INTRAMUSCULAR

## 2018-04-01 NOTE — Progress Notes (Signed)
Subjective:    Patient ID: Aimee Little, female    DOB: 1960/12/12, 57 y.o.   MRN: [id number]  HPI  Pt is a 57 yo female with lumbar DDD who presents to the clinic with acute worsening low back pain that is going down the backs of both legs and into feet that are swollen and numb. She had some symptoms last week but took 2 doses of prednisone and symptoms resolved. This week symptoms started back. She is using muscle relaxer which is helping. She request shot to help her through the holiday. She has hx of low back pain and gets epidural injections in [place]. She will follow up with them. No bowel or bladder dysfunction no saddle anesthesia. No recent hx of trauma.   Low grade fever today. No cough, sinus pressure, ear pain, stomach pain, dysuria, bowel changes. She has felt "hot" yesterday.   .. Active Ambulatory Problems    Diagnosis Date Noted  . HOT FLASHES 12/29/2009  . [place] DISEASE, LUMBAR 12/29/2009  . Fibromyalgia 01/01/2013  . Unspecified vitamin D deficiency 05/16/2013  . Migraine without status migrainosus, not intractable 07/21/2013  . Chronic pain syndrome 11/05/2013  . Anxiety and depression 12/17/2013  . Adenomatous colon polyp 12/20/2013  . Obesity 11/25/2014  . Other fatigue 05/26/2015  . Bilateral low back pain without sciatica 09/06/2015  . RLS (restless legs syndrome) 09/22/2015  . Thyroid activity decreased 10/27/2015  . Insomnia 11/21/2015  . Severe episode of recurrent major depressive disorder, without psychotic features ([place]) 11/29/2015  . Rhinitis, allergic 11/29/2015  . Vaginal atrophy 01/02/2016  . Left knee pain 01/23/2016  . Panic attacks 03/19/2016  . Loose body in knee 04/17/2016  . Chondromalacia of patellofemoral joint 04/17/2016  . Diverticulitis of colon without hemorrhage 07/19/2016  . Trochanteric bursitis of left hip 01/29/2017  . Nonintractable headache 03/06/2017  . Tachycardia 03/06/2017  . Rib pain on left side 08/03/2017  .  Diverticulitis 09/10/2017  . Elevated blood pressure reading 10/01/2017  . Light tobacco smoker 10/01/2017  . Hypertension goal BP (blood pressure) < 130/80 10/01/2017  . Rib pain on right side 11/04/2017  . Traumatic hematoma of eyelid, right, initial encounter 11/04/2017  . DDD (degenerative disc disease), lumbar 04/01/2018  . Low grade fever 04/01/2018  . Acute bilateral low back pain with bilateral sciatica 04/01/2018   Resolved Ambulatory Problems    Diagnosis Date Noted  . Unspecified hypothyroidism 12/29/2009  . TOBACCO ABUSE 06/13/2010  . GRIEF REACTION, ACUTE 12/29/2009  . Depression 02/06/2010  . Osteoarthrosis involving lower leg 12/29/2009  . NAUSEA AND VOMITING 05/02/2010  . Osteoarthritis of left knee 07/21/2013  . Medication management 11/05/2013  . Nausea with vomiting 04/07/2014  . Lumbago 11/25/2014  . Sinusitis 04/13/2015  . Sacroiliac joint dysfunction of both sides 09/06/2015  . Right knee pain 11/07/2015  . GAD (generalized anxiety disorder) 03/19/2016  . Abdominal pain, left lower quadrant 04/25/2016   Past Medical History:  Diagnosis Date  . DDD (degenerative disc disease), lumbar   . Depression   . Fibromyalgia   . Insomnia   . Migraine   . Thyroid disease   . Tobacco use      Review of Systems    see HPI.  Objective:   Physical Exam  Constitutional: She is oriented to person, place, and time. She appears well-developed and well-nourished.  HENT:  Head: Normocephalic and atraumatic.  Cardiovascular: Regular rhythm.  tachycardia  Musculoskeletal:  Tenderness to palpation over lumbar spine and  into lumbar paraspinal muscles.  Positive straight leg test.  Strength 4/5 lower extremity.   Neurological: She is alert and oriented to person, place, and time.  Skin: Skin is warm.  Scant edema bilateral feet.           Assessment & Plan:  Marland KitchenMarland KitchenMelissa was seen today for lower back/buttocks pain, bilateral feet swelling and  fever.  Diagnoses and all orders for this visit:  Acute bilateral low back pain with bilateral sciatica -     Ambulatory referral to Physical Therapy -     ketorolac (TORADOL) injection 60 mg -     methylPREDNISolone sodium succinate (SOLU-MEDROL) 125 mg/2 mL injection 125 mg  DDD (degenerative disc disease), lumbar -     Ambulatory referral to Physical Therapy -     ketorolac (TORADOL) injection 60 mg -     methylPREDNISolone sodium succinate (SOLU-MEDROL) 125 mg/2 mL injection 125 mg  Low grade fever -     methylPREDNISolone sodium succinate (SOLU-MEDROL) 125 mg/2 mL injection 125 mg  Other orders -     predniSONE (DELTASONE) 50 MG tablet; Take one tablet for 5 days.  .. Results for orders placed or performed in visit on 04/01/18  POCT urinalysis dipstick  Result Value Ref Range   Color, UA yellow    Clarity, UA clear    Glucose, UA Negative Negative   Bilirubin, UA negative    Ketones, UA negative    Spec Grav, UA 1.020 1.010 - 1.025   Blood, UA negative    pH, UA 6.0 5.0 - 8.0   Protein, UA Negative Negative   Urobilinogen, UA 0.2 0.2 or 1.0 E.U./dL   Nitrite, UA negative    Leukocytes, UA Negative Negative   Appearance     Odor      Symptoms consistent with a low back worsening episode could certainly be a disc. Follow up with ortho. Oral prednisone to start tomorrow. Continue muscle relaxer. No more NSAIDs today. Pt agreed to try PT.   Unclear etiology of low grade fever.  UA negative today. Pt does not have any URI symptoms. Follow up if fever persists or new symptoms.   Bilateral leg/ankle edema likely diet/heat related or venous stasis. Denies any SOB, orthopnea.

## 2018-04-03 ENCOUNTER — Telehealth: Payer: Self-pay

## 2018-04-03 MED ORDER — AMOXICILLIN-POT CLAVULANATE 875-125 MG PO TABS: 1.0000 | ORAL_TABLET | Freq: Two times a day (BID) | ORAL | 0 refills | Status: AC

## 2018-04-03 NOTE — Telephone Encounter (Signed)
Sent augmentin

## 2018-04-03 NOTE — Telephone Encounter (Signed)
Aimee Little called and states she has upper respiratory symptoms. Clogged ears, scratchy throat, watery eyes and post nasal drip along with the low grade fever. She states she is taking tylenol and a nasal spray. She would like an antibiotic.

## 2018-04-03 NOTE — Telephone Encounter (Signed)
Patient advised.

## 2018-05-01 ENCOUNTER — Other Ambulatory Visit: Payer: Self-pay | Admitting: Physician Assistant

## 2018-05-01 ENCOUNTER — Other Ambulatory Visit: Payer: Self-pay | Admitting: *Deleted

## 2018-05-01 DIAGNOSIS — K2100 Gastro-esophageal reflux disease with esophagitis, without bleeding: Secondary | ICD-10-CM

## 2018-05-01 DIAGNOSIS — F5101 Primary insomnia: Secondary | ICD-10-CM

## 2018-05-13 ENCOUNTER — Ambulatory Visit (INDEPENDENT_AMBULATORY_CARE_PROVIDER_SITE_OTHER): Payer: 59 | Admitting: Physician Assistant

## 2018-05-13 ENCOUNTER — Encounter: Payer: Self-pay | Admitting: Physician Assistant

## 2018-05-13 VITALS — BP 147/67 | HR 88 | Ht 70.0 in | Wt 206.0 lb

## 2018-05-13 DIAGNOSIS — Z23 Encounter for immunization: Secondary | ICD-10-CM | POA: Diagnosis not present

## 2018-05-13 DIAGNOSIS — F5101 Primary insomnia: Secondary | ICD-10-CM | POA: Diagnosis not present

## 2018-05-13 DIAGNOSIS — F32A Depression, unspecified: Secondary | ICD-10-CM

## 2018-05-13 DIAGNOSIS — F419 Anxiety disorder, unspecified: Secondary | ICD-10-CM

## 2018-05-13 DIAGNOSIS — F332 Major depressive disorder, recurrent severe without psychotic features: Secondary | ICD-10-CM | POA: Diagnosis not present

## 2018-05-13 DIAGNOSIS — F329 Major depressive disorder, single episode, unspecified: Secondary | ICD-10-CM

## 2018-05-13 MED ORDER — TEMAZEPAM 15 MG PO CAPS: 15.0000 mg | ORAL_CAPSULE | Freq: Every evening | ORAL | 1 refills | Status: DC | PRN

## 2018-05-13 NOTE — Progress Notes (Signed)
Subjective:    Patient ID: Aimee Little, female    DOB: 10-18-1960, 57 y.o.   MRN: [id number]  HPI  Patient is a 57 year old female with a long history of major depressive disorder who has struggled for years with symptom management.  She has tried multiple medications but none of them have seemed to truly work.  Currently Cymbalta helps the most with controlling depression but she feels like it causes her insomnia and agitation to be worse.  Trazodone is not helping at all to help her sleep.  Sleeping is her main concern today.  She has not had a full night's rest in 4 nights. She had a bad reaction to ambien, doxepin and seroquel. She does not want to try remeron due to weight gain.   . Active Ambulatory Problems    Diagnosis Date Noted  . HOT FLASHES 12/29/2009  . [place] DISEASE, LUMBAR 12/29/2009  . Fibromyalgia 01/01/2013  . Unspecified vitamin D deficiency 05/16/2013  . Migraine without status migrainosus, not intractable 07/21/2013  . Chronic pain syndrome 11/05/2013  . Anxiety and depression 12/17/2013  . Adenomatous colon polyp 12/20/2013  . Obesity 11/25/2014  . Other fatigue 05/26/2015  . Bilateral low back pain without sciatica 09/06/2015  . RLS (restless legs syndrome) 09/22/2015  . Thyroid activity decreased 10/27/2015  . Insomnia 11/21/2015  . Severe episode of recurrent major depressive disorder, without psychotic features ([place]) 11/29/2015  . Rhinitis, allergic 11/29/2015  . Vaginal atrophy 01/02/2016  . Left knee pain 01/23/2016  . Panic attacks 03/19/2016  . Loose body in knee 04/17/2016  . Chondromalacia of patellofemoral joint 04/17/2016  . Diverticulitis of colon without hemorrhage 07/19/2016  . Trochanteric bursitis of left hip 01/29/2017  . Nonintractable headache 03/06/2017  . Tachycardia 03/06/2017  . Rib pain on left side 08/03/2017  . Diverticulitis 09/10/2017  . Elevated blood pressure reading 10/01/2017  . Light tobacco smoker 10/01/2017  .  Hypertension goal BP (blood pressure) < 130/80 10/01/2017  . Rib pain on right side 11/04/2017  . Traumatic hematoma of eyelid, right, initial encounter 11/04/2017  . DDD (degenerative disc disease), lumbar 04/01/2018  . Low grade fever 04/01/2018  . Acute bilateral low back pain with bilateral sciatica 04/01/2018   Resolved Ambulatory Problems    Diagnosis Date Noted  . Unspecified hypothyroidism 12/29/2009  . TOBACCO ABUSE 06/13/2010  . GRIEF REACTION, ACUTE 12/29/2009  . Depression 02/06/2010  . Osteoarthrosis involving lower leg 12/29/2009  . NAUSEA AND VOMITING 05/02/2010  . Osteoarthritis of left knee 07/21/2013  . Medication management 11/05/2013  . Nausea with vomiting 04/07/2014  . Lumbago 11/25/2014  . Sinusitis 04/13/2015  . Sacroiliac joint dysfunction of both sides 09/06/2015  . Right knee pain 11/07/2015  . GAD (generalized anxiety disorder) 03/19/2016  . Abdominal pain, left lower quadrant 04/25/2016   Past Medical History:  Diagnosis Date  . Migraine   . Thyroid disease   . Tobacco use         Review of Systems See HPI.     Objective:   Physical Exam  Constitutional: She is oriented to person, place, and time. She appears well-developed and well-nourished.  HENT:  Head: Normocephalic and atraumatic.  Cardiovascular: Normal rate and regular rhythm.  Pulmonary/Chest: Effort normal and breath sounds normal.  Neurological: She is alert and oriented to person, place, and time.  Psychiatric: Her behavior is normal.  Flat affect.           Assessment & Plan:  Marland KitchenMarland KitchenDiagnoses and  all orders for this visit:  Primary insomnia -     temazepam (RESTORIL) 15 MG capsule; Take 1 capsule (15 mg total) by mouth at bedtime as needed for sleep.  Need for immunization against influenza -     Flu Vaccine QUAD 36+ mos IM  Anxiety and depression -     Ambulatory referral to Psychiatry  Severe episode of recurrent major depressive disorder, without psychotic  features ([place]) -     Ambulatory referral to Psychiatry   At this point we have tried all I know to try for mood. I made another referral to behavioral health in town. Strongly urged her to go. Will try restoril for sleep. Discussed xanax and restoril are in the same family and DO NOT TAKE them together. Discussed good sleep routine and relaxation techniques. She could be a good candidate for TMS(neurostar). I did not remember this until finishing note. I will reach out to patient and see how interested she is in this.   Marland Kitchen.Spent 30 minutes with patient and greater than 50 percent of visit spent counseling patient regarding treatment plan.

## 2018-05-14 ENCOUNTER — Other Ambulatory Visit: Payer: Self-pay

## 2018-05-14 DIAGNOSIS — I10 Essential (primary) hypertension: Secondary | ICD-10-CM

## 2018-05-14 MED ORDER — AMLODIPINE BESYLATE 5 MG PO TABS: 5.0000 mg | ORAL_TABLET | Freq: Every day | ORAL | 0 refills | Status: DC

## 2018-05-29 ENCOUNTER — Encounter: Payer: Self-pay | Admitting: Family Medicine

## 2018-05-29 ENCOUNTER — Ambulatory Visit (INDEPENDENT_AMBULATORY_CARE_PROVIDER_SITE_OTHER): Payer: 59 | Admitting: Family Medicine

## 2018-05-29 VITALS — BP 152/60 | HR 78 | Ht 70.0 in | Wt 207.0 lb

## 2018-05-29 DIAGNOSIS — G4733 Obstructive sleep apnea (adult) (pediatric): Secondary | ICD-10-CM | POA: Diagnosis not present

## 2018-05-29 DIAGNOSIS — M25562 Pain in left knee: Secondary | ICD-10-CM | POA: Diagnosis not present

## 2018-05-29 NOTE — Patient Instructions (Signed)
Thank you for coming in today. Call or go to the ER if you develop a large red swollen joint with extreme pain or oozing puss.  Let me know how the knee is feeling.  Recheck as needed.

## 2018-05-29 NOTE — Progress Notes (Signed)
Aimee Little is a 57 y.o. female who presents to [facility] today for left knee pain.  I have been seeing Aimee Little previously for left knee pain due to DJD.  She is had steroid injections in the past and done pretty well.  Her last injection was February 2019.  She did well recently.  She notes her pain is returned to bed and she suspects that due to the seasons changing.  She cannot recall any injury or change in activity.  She notes left knee pain worse with activity better with rest.  She is tried some over-the-counter medications which helped only a Little.  Additionally she notes that she has been diagnosed with sleep apnea and is in the process of having a CPAP titration.  She notes that she is did not been sleeping well and has fatigue.    ROS:  As above  Exam:  BP (!) 152/60   Pulse 78   Ht 5\' 10"  (1.778 m)   Wt 207 lb (93.9 kg)   BMI 29.70 kg/m  General: Well Developed, well nourished, and in no acute distress.  Neuro/Psych: Alert and oriented x3, extra-ocular muscles intact, able to move all 4 extremities, sensation grossly intact. Skin: Warm and dry, no rashes noted.  Respiratory: Not using accessory muscles, speaking in full sentences, trachea midline.  Cardiovascular: Pulses palpable, no extremity edema. Abdomen: Does not appear distended. MSK:  Left knee: normal-appearing without significant effusion.  No deformity or erythema. Nontender. Range of motion 0-120 degrees with significant retropatellar crepitations. Stable ligamentous exam. Intact flexion and extension strength.    Lab and Radiology Results Procedure: Real-time Ultrasound Guided Injection of left knee Device: GE Logiq E   Images permanently stored and available for review in the ultrasound unit. Verbal informed consent obtained.  Discussed risks and benefits of procedure. Warned about infection bleeding damage to structures skin hypopigmentation and fat  atrophy among others. Patient expresses understanding and agreement Time-out conducted.   Noted no overlying erythema, induration, or other signs of local infection.   Skin prepped in a sterile fashion.   Local anesthesia: Topical Ethyl chloride.   With sterile technique and under real time ultrasound guidance:  80 mg of Kenalog and 4 mL of Marcaine injected easily.   Completed without difficulty   Pain immediately resolved suggesting accurate placement of the medication.   Advised to call if fevers/chills, erythema, induration, drainage, or persistent bleeding.   Images permanently stored and available for review in the ultrasound unit.  Impression: Technically successful ultrasound guided injection.        Assessment and Plan: 57 y.o. female with left knee pain due to patellofemoral chondromalacia and DJD.  Plan to proceed with steroid injection.  Continue home exercise program previously taught.  Recheck as needed.  Proceed with CPAP titration.  I am optimistic that her fatigue symptoms will improve with well-controlled sleep apnea with CPAP.  PCP aware of this issue and current work-up and diagnosis.     Historical information moved to improve visibility of documentation.  Past Medical History:  Diagnosis Date  . DDD (degenerative disc disease), lumbar   . Depression   . Fibromyalgia   . Insomnia   . Migraine   . Thyroid disease   . Tobacco use    Past Surgical History:  Procedure Laterality Date  . ABDOMINAL HYSTERECTOMY     took uterus and both ovaries left   Social History   Tobacco Use  .  Smoking status: Light Tobacco Smoker    Packs/day: 0.25    Last attempt to quit: 10/02/2013    Years since quitting: 4.6  . Smokeless tobacco: Never Used  Substance Use Topics  . Alcohol use: No    Frequency: Never   family history includes Alcohol abuse in her brother; Depression in her mother; Diabetes in her sister; Heart attack in her father; Hyperlipidemia in her  mother; Hypertension in her brother, mother, and sister.  Medications: Current Outpatient Medications  Medication Sig Dispense Refill  . ALPRAZolam (XANAX) 0.5 MG tablet Take 1 tablet (0.5 mg total) by mouth at bedtime as needed for anxiety. 30 tablet 2  . AMBULATORY NON FORMULARY MEDICATION shingrx 2 doses to prevent shingles. 2 application 0  . amLODipine (NORVASC) 5 MG tablet Take 1 tablet (5 mg total) by mouth daily. 90 tablet 0  . aspirin EC 81 MG tablet Take 1 tablet (81 mg total) by mouth daily. 90 tablet 3  . botulinum toxin Type A (BOTOX) 100 UNITS SOLR injection Inject 100 Units into the muscle.    . busPIRone (BUSPAR) 15 MG tablet TAKE ONE TABLET BY MOUTH 2 TIMES A DAY 180 tablet 1  . cyclobenzaprine (FLEXERIL) 10 MG tablet Take 1 tablet (10 mg total) by mouth 3 (three) times daily as needed for muscle spasms. 180 tablet 0  . dexlansoprazole (DEXILANT) 60 MG capsule Take 1 capsule (60 mg total) by mouth daily. 90 capsule 1  . fluticasone (FLONASE) 50 MCG/ACT nasal spray USE 1 TO 2 SPRAYS IN EACH NOSTRIL ONCE DAILY. 16 g 6  . gabapentin (NEURONTIN) 300 MG capsule TAKE 1 CAPSULES BY MOUTH 3 TIMES A DAY AS NEEDED FOR PAIN/NEUROPATHY 270 capsule 4  . hydrOXYzine (ATARAX/VISTARIL) 25 MG tablet Take 1 tablet (25 mg total) by mouth 3 (three) times daily as needed for anxiety. 90 tablet 5  . ketorolac (TORADOL) 10 MG tablet Take 1 tablet (10 mg total) by mouth every 6 (six) hours as needed. 20 tablet 0  . levothyroxine (SYNTHROID, LEVOTHROID) 150 MCG tablet TAKE ONE TABLET BY MOUTH EVERY DAY BEFORE BREAKFAST. 90 tablet 3  . meloxicam (MOBIC) 15 MG tablet Take 1 tablet (15 mg total) by mouth daily. 90 tablet 1  . metoCLOPramide (REGLAN) 10 MG tablet TAKE 1/2 TO 1 TABLET BY MOUTH EVERY 8 HOURS AS NEEDED FOR NAUSEA/HEADACHE. 90 tablet 3  . Omega 3-6-9 Fatty Acids (OMEGA 3-6-9 COMPLEX PO) Take by mouth.    . ranitidine (ZANTAC) 300 MG tablet TAKE ONE TABLET BY MOUTH 2 TIMES A DAY 180 tablet 1  .  rizatriptan (MAXALT-MLT) 10 MG disintegrating tablet TAKE ONE TABLET BY MOUTH AS NEEDED. MAY REPEAT IN 2 HOURS IF NEEDED. 30 tablet 1  . rOPINIRole (REQUIP) 1 MG tablet TAKE ONE TABLET BY MOUTH 3 TIMES A DAY 270 tablet 1  . temazepam (RESTORIL) 15 MG capsule Take 1 capsule (15 mg total) by mouth at bedtime as needed for sleep. 30 capsule 1  . topiramate (TOPAMAX) 100 MG tablet TAKE ONE TABLET BY MOUTH 2 TIMES A DAY 180 tablet 1  . traMADol (ULTRAM) 50 MG tablet TAKE ONE to TWO TABLETs BY MOUTH EVERY  8-12 HOURS AS NEEDED FOR PAIN 90 tablet 0  . valACYclovir (VALTREX) 1000 MG tablet TAKE TWO TABLETS 2 TIMES A DAY AT ONSET OF COLD SORE FOR 2 DAYS 60 tablet 0  . Vitamin D, Ergocalciferol, (DRISDOL) 50000 units CAPS capsule Take 1 capsule (50,000 Units total) by mouth every 7 (  seven) days. 12 capsule 4  . VITAMIN E PO Take by mouth.     No current facility-administered medications for this visit.    Allergies  Allergen Reactions  . Doxepin Other (See Comments)  . Estradiol Other (See Comments)    Burning   . Levothyroxine     Other reaction(s): Other (See Comments) Reaction unknown  . Lexapro [Escitalopram Oxalate]     jittery  . Lisinopril     cough  . Losartan     itching  . Mirapex [Pramipexole Dihydrochloride]     Nausea and vomiting  . Phentermine     Stomach ache/increased anxiety.   . Pregabalin   . Pristiq [Desvenlafaxine Succinate Er]     Depression worse.  Romualdo Bolk [Desvenlafaxine] Other (See Comments)    Increased depression  . Seroquel [Quetiapine Fumarate]     hallicinations  . Trintellix [Vortioxetine]     RLS/increased anxiety  . Gabapentin     Sleep walking      Discussed warning signs or symptoms. Please see discharge instructions. Patient expresses understanding.

## 2018-06-03 ENCOUNTER — Ambulatory Visit: Payer: 59 | Admitting: Family Medicine

## 2018-06-08 ENCOUNTER — Encounter: Payer: Self-pay | Admitting: Family Medicine

## 2018-06-08 ENCOUNTER — Ambulatory Visit (INDEPENDENT_AMBULATORY_CARE_PROVIDER_SITE_OTHER): Payer: 59

## 2018-06-08 ENCOUNTER — Ambulatory Visit (INDEPENDENT_AMBULATORY_CARE_PROVIDER_SITE_OTHER): Payer: 59 | Admitting: Family Medicine

## 2018-06-08 VITALS — BP 146/78 | HR 93 | Wt 204.0 lb

## 2018-06-08 DIAGNOSIS — I10 Essential (primary) hypertension: Secondary | ICD-10-CM

## 2018-06-08 DIAGNOSIS — M25562 Pain in left knee: Secondary | ICD-10-CM | POA: Diagnosis not present

## 2018-06-08 MED ORDER — AMLODIPINE BESYLATE 10 MG PO TABS: 10.0000 mg | ORAL_TABLET | Freq: Every day | ORAL | 0 refills | Status: DC

## 2018-06-08 MED ORDER — PREDNISONE 50 MG PO TABS: 50.0000 mg | ORAL_TABLET | Freq: Every day | ORAL | 0 refills | Status: DC

## 2018-06-08 MED ORDER — HYDROCHLOROTHIAZIDE 25 MG PO TABS: 25.0000 mg | ORAL_TABLET | Freq: Every day | ORAL | 2 refills | Status: DC

## 2018-06-08 MED ORDER — HYDROCODONE-ACETAMINOPHEN 5-325 MG PO TABS: 1.0000 | ORAL_TABLET | Freq: Four times a day (QID) | ORAL | 0 refills | Status: DC | PRN

## 2018-06-08 NOTE — Patient Instructions (Addendum)
Thank you for coming in today. For knee pain take the prednisone for 5 days.  Use norco sparingly.    For blood pressure continue higher dose of amlodipine 10 mg daily.  Add HCTZ daily.    Hydrochlorothiazide, HCTZ capsules or tablets What is this medicine? HYDROCHLOROTHIAZIDE (hye droe klor oh THYE a zide) is a diuretic. It increases the amount of urine passed, which causes the body to lose salt and water. This medicine is used to treat high blood pressure. It is also reduces the swelling and water retention caused by various medical conditions, such as heart, liver, or kidney disease. This medicine may be used for other purposes; ask your health care provider or pharmacist if you have questions. COMMON BRAND NAME(S): Esidrix, Ezide, HydroDIURIL, Microzide, Oretic, Zide What should I tell my health care provider before I take this medicine? They need to know if you have any of these conditions: -diabetes -gout -immune system problems, like lupus -kidney disease or kidney stones -liver disease -pancreatitis -small amount of urine or difficulty passing urine -an unusual or allergic reaction to hydrochlorothiazide, sulfa drugs, other medicines, foods, dyes, or preservatives -pregnant or trying to get pregnant -breast-feeding How should I use this medicine? Take this medicine by mouth with a glass of water. Follow the directions on the prescription label. Take your medicine at regular intervals. Remember that you will need to pass urine frequently after taking this medicine. Do not take your doses at a time of day that will cause you problems. Do not stop taking your medicine unless your doctor tells you to. Talk to your pediatrician regarding the use of this medicine in children. Special care may be needed. Overdosage: If you think you have taken too much of this medicine contact a poison control center or emergency room at once. NOTE: This medicine is only for you. Do not share this  medicine with others. What if I miss a dose? If you miss a dose, take it as soon as you can. If it is almost time for your next dose, take only that dose. Do not take double or extra doses. What may interact with this medicine? -cholestyramine -colestipol -digoxin -dofetilide -lithium -medicines for blood pressure -medicines for diabetes -medicines that relax muscles for surgery -other diuretics -steroid medicines like prednisone or cortisone This list may not describe all possible interactions. Give your health care provider a list of all the medicines, herbs, non-prescription drugs, or dietary supplements you use. Also tell them if you smoke, drink alcohol, or use illegal drugs. Some items may interact with your medicine. What should I watch for while using this medicine? Visit your doctor or health care professional for regular checks on your progress. Check your blood pressure as directed. Ask your doctor or health care professional what your blood pressure should be and when you should contact him or her. You may need to be on a special diet while taking this medicine. Ask your doctor. Check with your doctor or health care professional if you get an attack of severe diarrhea, nausea and vomiting, or if you sweat a lot. The loss of too much body fluid can make it dangerous for you to take this medicine. You may get drowsy or dizzy. Do not drive, use machinery, or do anything that needs mental alertness until you know how this medicine affects you. Do not stand or sit up quickly, especially if you are an older patient. This reduces the risk of dizzy or fainting spells. Alcohol may  interfere with the effect of this medicine. Avoid alcoholic drinks. This medicine may affect your blood sugar level. If you have diabetes, check with your doctor or health care professional before changing the dose of your diabetic medicine. This medicine can make you more sensitive to the sun. Keep out of the sun.  If you cannot avoid being in the sun, wear protective clothing and use sunscreen. Do not use sun lamps or tanning beds/booths. What side effects may I notice from receiving this medicine? Side effects that you should report to your doctor or health care professional as soon as possible: -allergic reactions such as skin rash or itching, hives, swelling of the lips, mouth, tongue, or throat -changes in vision -chest pain -eye pain -fast or irregular heartbeat -feeling faint or lightheaded, falls -gout attack -muscle pain or cramps -pain or difficulty when passing urine -pain, tingling, numbness in the hands or feet -redness, blistering, peeling or loosening of the skin, including inside the mouth -unusually weak or tired Side effects that usually do not require medical attention (report to your doctor or health care professional if they continue or are bothersome): -change in sex drive or performance -dry mouth -headache -stomach upset This list may not describe all possible side effects. Call your doctor for medical advice about side effects. You may report side effects to FDA at 1-800-FDA-1088. Where should I keep my medicine? Keep out of the reach of children. Store at room temperature between 15 and 30 degrees C (59 and 86 degrees F). Do not freeze. Protect from light and moisture. Keep container closed tightly. Throw away any unused medicine after the expiration date. NOTE: This sheet is a summary. It may not cover all possible information. If you have questions about this medicine, talk to your doctor, pharmacist, or health care provider.  2018 [employer]vier/Gold Standard (2010-05-11 12:57:37)

## 2018-06-08 NOTE — Progress Notes (Signed)
Aimee Little is a 57 y.o. female who presents to [facility] today for left knee pain and hypertension.  Aimee Little was seen on August 30 for left knee pain and she has a history of pain in her left knee due to DJD and had done well previously with a steroid injection.  She was given a repeat steroid injection and notes that does not help much at all.  She notes continued pain and discomfort.  She is interested in the possibility of a short burst of prednisone sometimes as helped in the past as well.  She notes pain is quite bothersome and interfere with her ability to walk normally at times.  Additionally she notes her blood pressures been running higher than usual.  She has already increased her amlodipine from her usual 5 mg daily to 10 mg daily without much benefit.  No chest pain palpitations shortness of breath lightheadedness or dizziness.   ROS as above:  Exam:  BP (!) 146/78   Pulse 93   Wt 204 lb (92.5 kg)   BMI 29.27 kg/m  Wt Readings from Last 5 Encounters:  06/08/18 204 lb (92.5 kg)  05/29/18 207 lb (93.9 kg)  05/13/18 206 lb (93.4 kg)  04/01/18 215 lb (97.5 kg)  02/10/18 208 lb (94.3 kg)    Gen: Well NAD HEENT: EOMI,  MMM Lungs: Normal work of breathing. CTABL Heart: RRR no MRG Abd: NABS, Soft. Nondistended, Nontender Exts: Brisk capillary refill, warm and well perfused.  Left knee: Well-appearing without erythema or effusion. Range of motion 0-120 degrees with mild ridge patella crepitations. Tender to palpation lateral joint line. Stable ligamentous exam. Negative McMurray's test.  Lab and Radiology Results X-ray left knee images personally independently reviewed Moderate DJD without significant change from prior study.  No acute fractures. Await formal radiology review   Assessment and Plan: 57 y.o. female with  Left knee pain:  Unclear cause of worsening pain.  Patient without much benefit from steroid injection.  Plan for short burst of steroids as has worked in the past for her.  Additionally will treat pain temporarily with hydrocodone.  Recheck in 2 to 3 weeks.  Hypertension: Blood pressure not well controlled.  Continue amlodipine 10 mg new prescription written.  Additionally add hydrochlorothiazide and recheck in 2 to 3 weeks.  Patient researched [facility] Controlled Substance Reporting System.  Orders Placed This Encounter  Procedures  . DG Knee Complete 4 Views Left    Please include patellar sunrise, lateral, and weightbearing bilateral AP and bilateral rosenberg views    Standing Status:   Future    Number of Occurrences:   1    Standing Expiration Date:   08/08/2019    Order Specific Question:   Reason for exam:    Answer:   Please include patellar sunrise, lateral, and weightbearing bilateral AP and bilateral rosenberg views    Comments:   Please include patellar sunrise, lateral, and weightbearing bilateral AP and bilateral rosenberg views    Order Specific Question:   Preferred imaging location?    Answer:   [facility]  . DG Knee 1-2 Views Right    Standing Status:   Future    Number of Occurrences:   1    Standing Expiration Date:   08/09/2019    Order Specific Question:   Reason for Exam (SYMPTOM  OR DIAGNOSIS REQUIRED)    Answer:   For use with  the left knee x-ray bilateral AP and Rosenberg standing.    Order Specific Question:   Is the patient pregnant?    Answer:   No    Order Specific Question:   Preferred imaging location?    Answer:   [facility]   Meds ordered this encounter  Medications  . predniSONE (DELTASONE) 50 MG tablet    Sig: Take 1 tablet (50 mg total) by mouth daily.    Dispense:  5 tablet    Refill:  0  . amLODipine (NORVASC) 10 MG tablet    Sig: Take 1 tablet (10 mg total) by mouth daily.    Dispense:  90 tablet    Refill:  0  .  hydrochlorothiazide (HYDRODIURIL) 25 MG tablet    Sig: Take 1 tablet (25 mg total) by mouth daily.    Dispense:  30 tablet    Refill:  2  . HYDROcodone-acetaminophen (NORCO/VICODIN) 5-325 MG tablet    Sig: Take 1 tablet by mouth every 6 (six) hours as needed.    Dispense:  15 tablet    Refill:  0     Historical information moved to improve visibility of documentation.  Past Medical History:  Diagnosis Date  . DDD (degenerative disc disease), lumbar   . Depression   . Fibromyalgia   . Insomnia   . Migraine   . Thyroid disease   . Tobacco use    Past Surgical History:  Procedure Laterality Date  . ABDOMINAL HYSTERECTOMY     took uterus and both ovaries left   Social History   Tobacco Use  . Smoking status: Light Tobacco Smoker    Packs/day: 0.25    Last attempt to quit: 10/02/2013    Years since quitting: 4.6  . Smokeless tobacco: Never Used  Substance Use Topics  . Alcohol use: No    Frequency: Never   family history includes Alcohol abuse in her brother; Depression in her mother; Diabetes in her sister; Heart attack in her father; Hyperlipidemia in her mother; Hypertension in her brother, mother, and sister.  Medications: Current Outpatient Medications  Medication Sig Dispense Refill  . ALPRAZolam (XANAX) 0.5 MG tablet Take 1 tablet (0.5 mg total) by mouth at bedtime as needed for anxiety. 30 tablet 2  . AMBULATORY NON FORMULARY MEDICATION shingrx 2 doses to prevent shingles. 2 application 0  . amLODipine (NORVASC) 10 MG tablet Take 1 tablet (10 mg total) by mouth daily. 90 tablet 0  . aspirin EC 81 MG tablet Take 1 tablet (81 mg total) by mouth daily. 90 tablet 3  . botulinum toxin Type A (BOTOX) 100 UNITS SOLR injection Inject 100 Units into the muscle.    . busPIRone (BUSPAR) 15 MG tablet TAKE ONE TABLET BY MOUTH 2 TIMES A DAY 180 tablet 1  . cyclobenzaprine (FLEXERIL) 10 MG tablet Take 1 tablet (10 mg total) by mouth 3 (three) times daily as needed for muscle  spasms. 180 tablet 0  . dexlansoprazole (DEXILANT) 60 MG capsule Take 1 capsule (60 mg total) by mouth daily. 90 capsule 1  . fluticasone (FLONASE) 50 MCG/ACT nasal spray USE 1 TO 2 SPRAYS IN EACH NOSTRIL ONCE DAILY. 16 g 6  . gabapentin (NEURONTIN) 300 MG capsule TAKE 1 CAPSULES BY MOUTH 3 TIMES A DAY AS NEEDED FOR PAIN/NEUROPATHY 270 capsule 4  . hydrOXYzine (ATARAX/VISTARIL) 25 MG tablet Take 1 tablet (25 mg total) by mouth 3 (three) times daily as needed for anxiety. 90 tablet 5  . ketorolac (  TORADOL) 10 MG tablet Take 1 tablet (10 mg total) by mouth every 6 (six) hours as needed. 20 tablet 0  . levothyroxine (SYNTHROID, LEVOTHROID) 150 MCG tablet TAKE ONE TABLET BY MOUTH EVERY DAY BEFORE BREAKFAST. 90 tablet 3  . meloxicam (MOBIC) 15 MG tablet Take 1 tablet (15 mg total) by mouth daily. 90 tablet 1  . metoCLOPramide (REGLAN) 10 MG tablet TAKE 1/2 TO 1 TABLET BY MOUTH EVERY 8 HOURS AS NEEDED FOR NAUSEA/HEADACHE. 90 tablet 3  . Omega 3-6-9 Fatty Acids (OMEGA 3-6-9 COMPLEX PO) Take by mouth.    . ranitidine (ZANTAC) 300 MG tablet TAKE ONE TABLET BY MOUTH 2 TIMES A DAY 180 tablet 1  . rizatriptan (MAXALT-MLT) 10 MG disintegrating tablet TAKE ONE TABLET BY MOUTH AS NEEDED. MAY REPEAT IN 2 HOURS IF NEEDED. 30 tablet 1  . rOPINIRole (REQUIP) 1 MG tablet TAKE ONE TABLET BY MOUTH 3 TIMES A DAY 270 tablet 1  . temazepam (RESTORIL) 15 MG capsule Take 1 capsule (15 mg total) by mouth at bedtime as needed for sleep. 30 capsule 1  . topiramate (TOPAMAX) 100 MG tablet TAKE ONE TABLET BY MOUTH 2 TIMES A DAY 180 tablet 1  . traMADol (ULTRAM) 50 MG tablet TAKE ONE to TWO TABLETs BY MOUTH EVERY  8-12 HOURS AS NEEDED FOR PAIN 90 tablet 0  . valACYclovir (VALTREX) 1000 MG tablet TAKE TWO TABLETS 2 TIMES A DAY AT ONSET OF COLD SORE FOR 2 DAYS 60 tablet 0  . Vitamin D, Ergocalciferol, (DRISDOL) 50000 units CAPS capsule Take 1 capsule (50,000 Units total) by mouth every 7 (seven) days. 12 capsule 4  . VITAMIN E PO  Take by mouth.    . hydrochlorothiazide (HYDRODIURIL) 25 MG tablet Take 1 tablet (25 mg total) by mouth daily. 30 tablet 2  . HYDROcodone-acetaminophen (NORCO/VICODIN) 5-325 MG tablet Take 1 tablet by mouth every 6 (six) hours as needed. 15 tablet 0  . predniSONE (DELTASONE) 50 MG tablet Take 1 tablet (50 mg total) by mouth daily. 5 tablet 0   No current facility-administered medications for this visit.    Allergies  Allergen Reactions  . Doxepin Other (See Comments)  . Estradiol Other (See Comments)    Burning   . Levothyroxine     Other reaction(s): Other (See Comments) Reaction unknown  . Lexapro [Escitalopram Oxalate]     jittery  . Lisinopril     cough  . Losartan     itching  . Mirapex [Pramipexole Dihydrochloride]     Nausea and vomiting  . Phentermine     Stomach ache/increased anxiety.   . Pregabalin   . Pristiq [Desvenlafaxine Succinate Er]     Depression worse.  Romualdo Bolk [Desvenlafaxine] Other (See Comments)    Increased depression  . Seroquel [Quetiapine Fumarate]     hallicinations  . Trintellix [Vortioxetine]     RLS/increased anxiety  . Gabapentin     Sleep walking     Discussed warning signs or symptoms. Please see discharge instructions. Patient expresses understanding.

## 2018-06-10 ENCOUNTER — Other Ambulatory Visit: Payer: Self-pay | Admitting: Physician Assistant

## 2018-06-10 DIAGNOSIS — M545 Low back pain, unspecified: Secondary | ICD-10-CM

## 2018-06-10 DIAGNOSIS — G894 Chronic pain syndrome: Secondary | ICD-10-CM

## 2018-06-10 DIAGNOSIS — M5137 Other intervertebral disc degeneration, lumbosacral region: Secondary | ICD-10-CM

## 2018-06-10 DIAGNOSIS — M797 Fibromyalgia: Secondary | ICD-10-CM

## 2018-06-22 ENCOUNTER — Ambulatory Visit: Payer: 59 | Admitting: Family Medicine

## 2018-06-30 ENCOUNTER — Other Ambulatory Visit: Payer: Self-pay | Admitting: Physician Assistant

## 2018-06-30 DIAGNOSIS — F5101 Primary insomnia: Secondary | ICD-10-CM

## 2018-07-11 ENCOUNTER — Other Ambulatory Visit: Payer: Self-pay | Admitting: Physician Assistant

## 2018-07-11 DIAGNOSIS — G43909 Migraine, unspecified, not intractable, without status migrainosus: Secondary | ICD-10-CM

## 2018-07-22 ENCOUNTER — Ambulatory Visit (INDEPENDENT_AMBULATORY_CARE_PROVIDER_SITE_OTHER): Payer: 59 | Admitting: Family Medicine

## 2018-07-22 VITALS — BP 122/58 | HR 91 | Ht 70.0 in | Wt 207.0 lb

## 2018-07-22 DIAGNOSIS — S39012A Strain of muscle, fascia and tendon of lower back, initial encounter: Secondary | ICD-10-CM

## 2018-07-22 DIAGNOSIS — M797 Fibromyalgia: Secondary | ICD-10-CM | POA: Diagnosis not present

## 2018-07-22 MED ORDER — KETOROLAC TROMETHAMINE 60 MG/2ML IM SOLN
60.0000 mg | Freq: Once | INTRAMUSCULAR | Status: AC
  Administered 2018-07-22: 60 mg via INTRAMUSCULAR

## 2018-07-22 MED ORDER — METHYLPREDNISOLONE ACETATE 80 MG/ML IJ SUSP
80.0000 mg | Freq: Once | INTRAMUSCULAR | Status: AC
  Administered 2018-07-22: 80 mg via INTRAMUSCULAR

## 2018-07-22 NOTE — Progress Notes (Signed)
Aimee [facility] is a 57 y.o. female who presents to [facility] today for back pain.  Listed notes right lower back pain starting a few days ago.  She denies any radiating pain weakness or numbness bowel bladder dysfunction.  She notes her back pain occurred without injury and is consistent with previous episodes of low back strain.  She also notes overall body aching and soreness consistent with fibromyalgia flare.  In the past she is done well with injection of Depo-Medrol and Toradol for a fibromyalgia flare would like that if possible.  She feels reasonably well and has been try to take over-the-counter medicines as well as her chronic existing medications for pain which helped a bit the past.   ROS as above:  Exam:  BP (!) 122/58   Pulse 91   Ht 5\' 10"  (1.778 m)   Wt 207 lb (93.9 kg)   BMI 29.70 kg/m  Wt Readings from Last 5 Encounters:  07/22/18 207 lb (93.9 kg)  06/08/18 204 lb (92.5 kg)  05/29/18 207 lb (93.9 kg)  05/13/18 206 lb (93.4 kg)  04/01/18 215 lb (97.5 kg)    Gen: Well NAD HEENT: EOMI,  MMM Lungs: Normal work of breathing. CTABL Heart: RRR no MRG Abd: NABS, Soft. Nondistended, Nontender Exts: Brisk capillary refill, warm and well perfused.  L-spine: Nontender to spinal midline.  Tender to palpation right lumbar paraspinal musculature.  Normal lumbar motion.  Lower extremity strength reflexes and sensation are equal normal throughout. Normal gait. Patient is diffusely sore and muscle groups of bilateral upper arms thighs and lower legs  Lab and Radiology Results  EXAM: LUMBAR SPINE - COMPLETE 4+ VIEW  COMPARISON:  None.  FINDINGS: There are 5 non rib-bearing lumbar type vertebral bodies. There is straightening of the normal lumbar lordosis with slight retrolisthesis of L3 on L4 and L4 on L5, likely degenerative and facet mediated.  Moderate disc space narrowing is present at L3-4 and L4-5 with likely vacuum disc at L4-5. Marginal endplate osteophytes are present at these two levels. No pars defects are identified. No lytic or blastic osseous lesion is identified. Small calcifications in the pelvis likely represent phleboliths.  IMPRESSION: Lower lumbar disc and facet degeneration, greatest at L4-5. No acute abnormality identified.   Electronically Signed   By: Logan Bores M.D.   On: 09/06/2015 15:23 I personally (independently) visualized and performed the interpretation of the images attached in this note.    Assessment and Plan: 57 y.o. female with  Low back strain with likely examination for current low back pain.  Plan for home exercise program relative rest heating pad TENS unit.  Refer to physical therapy in the future if not improving.  Fibromyalgia flare: Trial of IM Toradol and methylprednisolone as this is helped in the past.  Continue existing chronic medications and follow-up with PCP if needed.  No orders of the defined types were placed in this encounter.  Meds ordered this encounter  Medications  . ketorolac (TORADOL) injection 60 mg  . methylPREDNISolone acetate (DEPO-MEDROL) injection 80 mg     Historical information moved to improve visibility of documentation.  Past Medical History:  Diagnosis Date  . DDD (degenerative disc disease), lumbar   . Depression   . Fibromyalgia   . Insomnia   . Migraine   . Thyroid disease   . Tobacco use    Past Surgical History:  Procedure Laterality Date  .  ABDOMINAL HYSTERECTOMY     took uterus and both ovaries left   Social History   Tobacco Use  . Smoking status: Light Tobacco Smoker    Packs/day: 0.25    Last attempt to quit: 10/02/2013    Years since quitting: 4.8  . Smokeless tobacco: Never Used  Substance Use Topics  . Alcohol use: No    Frequency: Never   family history includes Alcohol abuse in her brother; Depression in  her mother; Diabetes in her sister; Heart attack in her father; Hyperlipidemia in her mother; Hypertension in her brother, mother, and sister.  Medications: Current Outpatient Medications  Medication Sig Dispense Refill  . ALPRAZolam (XANAX) 0.5 MG tablet Take 1 tablet (0.5 mg total) by mouth at bedtime as needed for anxiety. 30 tablet 2  . AMBULATORY NON FORMULARY MEDICATION shingrx 2 doses to prevent shingles. 2 application 0  . amLODipine (NORVASC) 10 MG tablet Take 1 tablet (10 mg total) by mouth daily. 90 tablet 0  . aspirin EC 81 MG tablet Take 1 tablet (81 mg total) by mouth daily. 90 tablet 3  . botulinum toxin Type A (BOTOX) 100 UNITS SOLR injection Inject 100 Units into the muscle.    . busPIRone (BUSPAR) 15 MG tablet TAKE ONE TABLET BY MOUTH 2 TIMES A DAY 180 tablet 1  . cyclobenzaprine (FLEXERIL) 10 MG tablet Take 1 tablet (10 mg total) by mouth 3 (three) times daily as needed for muscle spasms. 180 tablet 0  . dexlansoprazole (DEXILANT) 60 MG capsule Take 1 capsule (60 mg total) by mouth daily. 90 capsule 1  . fluticasone (FLONASE) 50 MCG/ACT nasal spray USE 1 TO 2 SPRAYS IN EACH NOSTRIL ONCE DAILY. 16 g 6  . gabapentin (NEURONTIN) 300 MG capsule TAKE 1 CAPSULES BY MOUTH 3 TIMES A DAY AS NEEDED FOR PAIN/NEUROPATHY 270 capsule 4  . hydrochlorothiazide (HYDRODIURIL) 25 MG tablet Take 1 tablet (25 mg total) by mouth daily. 30 tablet 2  . HYDROcodone-acetaminophen (NORCO/VICODIN) 5-325 MG tablet Take 1 tablet by mouth every 6 (six) hours as needed. 15 tablet 0  . hydrOXYzine (ATARAX/VISTARIL) 25 MG tablet Take 1 tablet (25 mg total) by mouth 3 (three) times daily as needed for anxiety. 90 tablet 5  . levothyroxine (SYNTHROID, LEVOTHROID) 150 MCG tablet TAKE ONE TABLET BY MOUTH EVERY DAY BEFORE BREAKFAST. 90 tablet 3  . meloxicam (MOBIC) 15 MG tablet Take 1 tablet (15 mg total) by mouth daily. 90 tablet 1  . metoCLOPramide (REGLAN) 10 MG tablet TAKE 1/2 TO 1 TABLET BY MOUTH EVERY 8 HOURS  AS NEEDED FOR NAUSEA/HEADACHE. 90 tablet 3  . Omega 3-6-9 Fatty Acids (OMEGA 3-6-9 COMPLEX PO) Take by mouth.    . rizatriptan (MAXALT-MLT) 10 MG disintegrating tablet TAKE ONE TABLET BY MOUTH AS NEEDED. MAY REPEAT IN 2 HOURS IF NEEDED. 30 tablet 1  . rOPINIRole (REQUIP) 1 MG tablet TAKE ONE TABLET BY MOUTH 3 TIMES A DAY 270 tablet 1  . temazepam (RESTORIL) 15 MG capsule Take 1 capsule (15 mg total) by mouth at bedtime as needed for sleep. 30 capsule 2  . topiramate (TOPAMAX) 100 MG tablet TAKE ONE TABLET BY MOUTH 2 TIMES A DAY 180 tablet 1  . traMADol (ULTRAM) 50 MG tablet TAKE ONE to TWO TABLETs BY MOUTH EVERY 8-12 HOURS AS NEEDED FOR PAIN 90 tablet 0  . valACYclovir (VALTREX) 1000 MG tablet TAKE TWO TABLETS 2 TIMES A DAY AT ONSET OF COLD SORE FOR 2 DAYS 60 tablet 0  .  Vitamin D, Ergocalciferol, (DRISDOL) 50000 units CAPS capsule Take 1 capsule (50,000 Units total) by mouth every 7 (seven) days. 12 capsule 4  . VITAMIN E PO Take by mouth.     No current facility-administered medications for this visit.    Allergies  Allergen Reactions  . Doxepin Other (See Comments)  . Estradiol Other (See Comments)    Burning   . Levothyroxine     Other reaction(s): Other (See Comments) Reaction unknown  . Lexapro [Escitalopram Oxalate]     jittery  . Lisinopril     cough  . Losartan     itching  . Mirapex [Pramipexole Dihydrochloride]     Nausea and vomiting  . Phentermine     Stomach ache/increased anxiety.   . Pregabalin   . Pristiq [Desvenlafaxine Succinate Er]     Depression worse.  Romualdo Bolk [Desvenlafaxine] Other (See Comments)    Increased depression  . Seroquel [Quetiapine Fumarate]     hallicinations  . Trintellix [Vortioxetine]     RLS/increased anxiety  . Gabapentin     Sleep walking     Discussed warning signs or symptoms. Please see discharge instructions. Patient expresses understanding.

## 2018-07-22 NOTE — Patient Instructions (Signed)
Thank you for coming in today. Ok to do the injection today.  If not getting better let me know and we can do PT.  Use heating pad and TENS unit.  Continue chronic medications.  Come back or go to the emergency room if you notice new weakness new numbness problems walking or bowel or bladder problems.

## 2018-08-04 ENCOUNTER — Ambulatory Visit: Payer: 59 | Admitting: Physician Assistant

## 2018-08-04 ENCOUNTER — Encounter: Payer: Self-pay | Admitting: Physician Assistant

## 2018-08-04 VITALS — BP 128/61 | HR 97 | Resp 14 | Ht 70.0 in | Wt 207.0 lb

## 2018-08-04 DIAGNOSIS — M5136 Other intervertebral disc degeneration, lumbar region: Secondary | ICD-10-CM | POA: Diagnosis not present

## 2018-08-04 DIAGNOSIS — M546 Pain in thoracic spine: Secondary | ICD-10-CM

## 2018-08-04 DIAGNOSIS — M545 Low back pain, unspecified: Secondary | ICD-10-CM

## 2018-08-04 DIAGNOSIS — G8929 Other chronic pain: Secondary | ICD-10-CM | POA: Insufficient documentation

## 2018-08-04 DIAGNOSIS — G894 Chronic pain syndrome: Secondary | ICD-10-CM

## 2018-08-04 DIAGNOSIS — M797 Fibromyalgia: Secondary | ICD-10-CM

## 2018-08-04 DIAGNOSIS — M5442 Lumbago with sciatica, left side: Secondary | ICD-10-CM

## 2018-08-04 DIAGNOSIS — M5137 Other intervertebral disc degeneration, lumbosacral region: Secondary | ICD-10-CM

## 2018-08-04 DIAGNOSIS — M5441 Lumbago with sciatica, right side: Secondary | ICD-10-CM

## 2018-08-04 MED ORDER — KETOROLAC TROMETHAMINE 30 MG/ML IJ SOLN
30.0000 mg | Freq: Once | INTRAMUSCULAR | Status: AC
  Administered 2018-08-04: 30 mg via INTRAMUSCULAR

## 2018-08-04 MED ORDER — HYDROCODONE-ACETAMINOPHEN 5-325 MG PO TABS: ORAL_TABLET | ORAL | 0 refills | Status: DC

## 2018-08-04 MED ORDER — TRAMADOL HCL 50 MG PO TABS: ORAL_TABLET | ORAL | 0 refills | Status: DC

## 2018-08-04 MED ORDER — PREDNISONE 50 MG PO TABS: ORAL_TABLET | ORAL | 0 refills | Status: DC

## 2018-08-04 NOTE — Progress Notes (Addendum)
Subjective:    Patient ID: Aimee Little, female    DOB: 02/15/1961, 57 y.o.   MRN: [id number]  HPI Pt is a 57 yo female with chronic pain, upper and lower back pain, hypothyroidism, MDD, migraines who presents to the clinic with worsening mid and low back pain.   MDD-she is seeing psychiatry now and restarted trintellix. So far she has tolerated this much better than when I prescribed it.   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 She is worried about keeping up with her low back pain. Prednisone helps a lot but she knows she cannot takes this regularly. Denies any new injury. She has had epidural injections which help a lot. She has not had one in over a year. No bowel or bladder dysfunction. No saddle anesthesia. occasinoally she will have some tingling running down legs off an on. She has lots of muscle spasms that complicate the pain.   Migraines are fairly controlled with topamax 100mg  twice a daily.   ... Active Ambulatory Problems    Diagnosis Date Noted  . HOT FLASHES 12/29/2009  . [place] DISEASE, LUMBAR 12/29/2009  . Fibromyalgia 01/01/2013  . Unspecified vitamin D deficiency 05/16/2013  . Migraine without status migrainosus, not intractable 07/21/2013  . Chronic pain syndrome 11/05/2013  . Anxiety and depression 12/17/2013  . Adenomatous colon polyp 12/20/2013  . Obesity 11/25/2014  . Other fatigue 05/26/2015  . Bilateral low back pain without sciatica 09/06/2015  . RLS (restless legs syndrome) 09/22/2015  . Thyroid activity decreased 10/27/2015  . Insomnia 11/21/2015  . Severe episode of recurrent major depressive disorder, without psychotic features ([place]) 11/29/2015  . Rhinitis, allergic 11/29/2015  . Vaginal atrophy 01/02/2016  . Left knee pain 01/23/2016  . Panic attacks 03/19/2016  . Loose body in knee 04/17/2016  . Chondromalacia of patellofemoral joint 04/17/2016  . Diverticulitis of colon without hemorrhage 07/19/2016  . Nonintractable headache  03/06/2017  . Tachycardia 03/06/2017  . Rib pain on left side 08/03/2017  . Diverticulitis 09/10/2017  . Elevated blood pressure reading 10/01/2017  . Light tobacco smoker 10/01/2017  . Hypertension goal BP (blood pressure) < 130/80 10/01/2017  . Rib pain on right side 11/04/2017  . Traumatic hematoma of eyelid, right, initial encounter 11/04/2017  . DDD (degenerative disc disease), lumbar 04/01/2018  . Acute bilateral low back pain with bilateral sciatica 04/01/2018  . OSA (obstructive sleep apnea) 05/29/2018  . Chronic bilateral thoracic back pain 08/04/2018   Resolved Ambulatory Problems    Diagnosis Date Noted  . Unspecified hypothyroidism 12/29/2009  . TOBACCO ABUSE 06/13/2010  . GRIEF REACTION, ACUTE 12/29/2009  . Depression 02/06/2010  . Osteoarthrosis involving lower leg 12/29/2009  . NAUSEA AND VOMITING 05/02/2010  . Osteoarthritis of left knee 07/21/2013  . Medication management 11/05/2013  . Nausea with vomiting 04/07/2014  . Lumbago 11/25/2014  . Sinusitis 04/13/2015  . Sacroiliac joint dysfunction of both sides 09/06/2015  . Right knee pain 11/07/2015  . GAD (generalized anxiety disorder) 03/19/2016  . Abdominal pain, left lower quadrant 04/25/2016  . Trochanteric bursitis of left hip 01/29/2017  . Low grade fever 04/01/2018   Past Medical History:  Diagnosis Date  . Migraine   . Thyroid disease   . Tobacco use      Review of Systems  All other systems reviewed and are negative.      Objective:   Physical Exam  Constitutional: She is oriented to person, place, and time. She appears well-developed  and well-nourished.  HENT:  Head: Normocephalic and atraumatic.  Right Ear: External ear normal.  Left Ear: External ear normal.  Cardiovascular: Normal rate and regular rhythm.  Pulmonary/Chest: Effort normal and breath sounds normal.  Musculoskeletal:  Tenderness to palpation over the paraspinal muscles from mid back down into lumbar spine. Pain over SI  joints to palpation.  NROm at hips.  Strength 5/5.   Neurological: She is alert and oriented to person, place, and time.  Skin: No rash noted.  Psychiatric: She has a normal mood and affect. Her behavior is normal.          Assessment & Plan:   Marland KitchenMarland KitchenMelissa was seen today for hip pain and neck pain.  Diagnoses and all orders for this visit:  DDD (degenerative disc disease), lumbar -     HYDROcodone-acetaminophen (NORCO/VICODIN) 5-325 MG tablet; Take one tablet as needed for break through pain. -     predniSONE (DELTASONE) 50 MG tablet; Take one tablet for 5 days. -     ketorolac (TORADOL) 30 MG/ML injection 30 mg -     ketorolac (TORADOL) 30 MG/ML injection 30 mg -     AMBULATORY NON FORMULARY MEDICATION; Tens unit for lumbar spine for low back pain, muscle spasm, lumbar DDD.  Acute bilateral low back pain with bilateral sciatica -     HYDROcodone-acetaminophen (NORCO/VICODIN) 5-325 MG tablet; Take one tablet as needed for break through pain. -     predniSONE (DELTASONE) 50 MG tablet; Take one tablet for 5 days. -     ketorolac (TORADOL) 30 MG/ML injection 30 mg -     ketorolac (TORADOL) 30 MG/ML injection 30 mg -     AMBULATORY NON FORMULARY MEDICATION; Tens unit for lumbar spine for low back pain, muscle spasm, lumbar DDD.  Chronic pain syndrome -     HYDROcodone-acetaminophen (NORCO/VICODIN) 5-325 MG tablet; Take one tablet as needed for break through pain. -     traMADol (ULTRAM) 50 MG tablet; Take 2 twice a day as needed for moderate to severe pain. -     predniSONE (DELTASONE) 50 MG tablet; Take one tablet for 5 days. -     ketorolac (TORADOL) 30 MG/ML injection 30 mg -     ketorolac (TORADOL) 30 MG/ML injection 30 mg -     AMBULATORY NON FORMULARY MEDICATION; Tens unit for lumbar spine for low back pain, muscle spasm, lumbar DDD.  DISC DISEASE, LUMBAR -     HYDROcodone-acetaminophen (NORCO/VICODIN) 5-325 MG tablet; Take one tablet as needed for break through pain. -      traMADol (ULTRAM) 50 MG tablet; Take 2 twice a day as needed for moderate to severe pain.  Acute bilateral low back pain without sciatica -     HYDROcodone-acetaminophen (NORCO/VICODIN) 5-325 MG tablet; Take one tablet as needed for break through pain. -     traMADol (ULTRAM) 50 MG tablet; Take 2 twice a day as needed for moderate to severe pain. -     predniSONE (DELTASONE) 50 MG tablet; Take one tablet for 5 days. -     ketorolac (TORADOL) 30 MG/ML injection 30 mg -     ketorolac (TORADOL) 30 MG/ML injection 30 mg -     AMBULATORY NON FORMULARY MEDICATION; Tens unit for lumbar spine for low back pain, muscle spasm, lumbar DDD.  Fibromyalgia -     traMADol (ULTRAM) 50 MG tablet; Take 2 twice a day as needed for moderate to severe pain.  Chronic bilateral thoracic back  pain -     HYDROcodone-acetaminophen (NORCO/VICODIN) 5-325 MG tablet; Take one tablet as needed for break through pain. -     predniSONE (DELTASONE) 50 MG tablet; Take one tablet for 5 days. -     ketorolac (TORADOL) 30 MG/ML injection 30 mg -     ketorolac (TORADOL) 30 MG/ML injection 30 mg -     AMBULATORY NON FORMULARY MEDICATION; Tens unit for lumbar spine for low back pain, muscle spasm, lumbar DDD.    Continue to work on depression as it can help with pain control.   I do think patient would benefit from tens unit with the lumbar back brace. I would use tens unit 14minutes 2-3 times a day.  Will see if medicare will pay for it. Prednisone burst given. Toradaol 60mg  IM given today. Make appt for epidural injection. norco small quantity given for moderate to severe pain. Discussed stretches to start. Use lots of heat and salonpas.   [place] controlled substance database reviewed without concerns.

## 2018-08-05 ENCOUNTER — Telehealth: Payer: Self-pay

## 2018-08-05 MED ORDER — FAMOTIDINE 20 MG PO TABS: 20.0000 mg | ORAL_TABLET | Freq: Two times a day (BID) | ORAL | 11 refills | Status: DC

## 2018-08-05 NOTE — Telephone Encounter (Signed)
Medication sent and patient advised.

## 2018-08-05 NOTE — Telephone Encounter (Signed)
Ok for one tablet twice a day as needed before meals. #[phone] refills.

## 2018-08-05 NOTE — Telephone Encounter (Signed)
Aimee Little can't take Zantac and needs a different medication. She would like a prescription for Pepcid AC. Please advise.

## 2018-08-07 MED ORDER — AMBULATORY NON FORMULARY MEDICATION: 0 refills | Status: AC

## 2018-08-10 ENCOUNTER — Telehealth: Payer: Self-pay | Admitting: Physician Assistant

## 2018-08-10 ENCOUNTER — Ambulatory Visit: Payer: 59

## 2018-08-10 MED ORDER — CEFDINIR 300 MG PO CAPS: 300.0000 mg | ORAL_CAPSULE | Freq: Two times a day (BID) | ORAL | 0 refills | Status: DC

## 2018-08-10 NOTE — Telephone Encounter (Signed)
Sent omnicef

## 2018-08-10 NOTE — Telephone Encounter (Signed)
Pt called to see if PCP would sent over an antibiotic for her. States she started having symptoms of running nose, drainage, congestion since last thursday. States she doesn't want to get out of bed for an appointment. Denies any coughing. Denies any fever. States she doesn't want the last antibiotic Rx that was sent in for her because the tablet was too big. Pharmacy on file correct. Routing.

## 2018-08-10 NOTE — Telephone Encounter (Signed)
Pt advised.

## 2018-08-19 ENCOUNTER — Telehealth: Payer: Self-pay

## 2018-08-19 NOTE — Telephone Encounter (Signed)
Aimee Little from [facility] who provides back braces and tens units for patients, called requesting additional information.   In order for insurance to cover back brace, an addendum needs to be added to last OV stating the recommendation and need for back brace.   Once this is added, note needs to be faxed to [phone].

## 2018-08-24 ENCOUNTER — Other Ambulatory Visit: Payer: Self-pay | Admitting: Physician Assistant

## 2018-08-25 NOTE — Telephone Encounter (Signed)
Faxed. °Confirmation received °

## 2018-08-25 NOTE — Telephone Encounter (Signed)
Done  Please fax

## 2018-08-31 ENCOUNTER — Other Ambulatory Visit: Payer: Self-pay | Admitting: Family Medicine

## 2018-08-31 DIAGNOSIS — I10 Essential (primary) hypertension: Secondary | ICD-10-CM

## 2018-09-01 ENCOUNTER — Other Ambulatory Visit: Payer: Self-pay | Admitting: Family Medicine

## 2018-09-01 DIAGNOSIS — I10 Essential (primary) hypertension: Secondary | ICD-10-CM

## 2018-09-01 NOTE — Telephone Encounter (Signed)
Patient requests a refill. Filled by Dr Georgina Snell. Please advise.

## 2018-09-18 ENCOUNTER — Other Ambulatory Visit: Payer: Self-pay | Admitting: Physician Assistant

## 2018-09-18 DIAGNOSIS — M5441 Lumbago with sciatica, right side: Secondary | ICD-10-CM

## 2018-09-18 DIAGNOSIS — M545 Low back pain, unspecified: Secondary | ICD-10-CM

## 2018-09-18 DIAGNOSIS — G894 Chronic pain syndrome: Secondary | ICD-10-CM

## 2018-09-18 DIAGNOSIS — M5442 Lumbago with sciatica, left side: Secondary | ICD-10-CM

## 2018-09-18 DIAGNOSIS — M546 Pain in thoracic spine: Secondary | ICD-10-CM

## 2018-09-18 DIAGNOSIS — M5136 Other intervertebral disc degeneration, lumbar region: Secondary | ICD-10-CM

## 2018-09-18 DIAGNOSIS — G8929 Other chronic pain: Secondary | ICD-10-CM

## 2018-10-13 ENCOUNTER — Telehealth: Payer: Self-pay

## 2018-10-13 NOTE — Telephone Encounter (Signed)
How often are you taking this dose? Daily? Intermittently. I do not want to get back to the nausea due to poly pharmacy(multiple medicine).

## 2018-10-13 NOTE — Telephone Encounter (Signed)
Aimee Little called and states she taking at least 3 tablets a day of the Reglan when she feels nauseated. She would like the prescription increased. Please advise.

## 2018-10-15 MED ORDER — METOCLOPRAMIDE HCL 10 MG PO TABS: 10.0000 mg | ORAL_TABLET | Freq: Three times a day (TID) | ORAL | 0 refills | Status: DC

## 2018-10-15 NOTE — Telephone Encounter (Signed)
I sent over refill 10mg  is highest does it comes in. Sent for up to 4 times a day but would not use unless you really need too.

## 2018-10-15 NOTE — Telephone Encounter (Signed)
Patient advised.

## 2018-10-15 NOTE — Telephone Encounter (Signed)
She states she has been taking it 3 times a day due to an increase in nausea. She doesn't always take it 3 times daily just when she has increased nausea.

## 2018-11-02 ENCOUNTER — Other Ambulatory Visit: Payer: Self-pay | Admitting: Physician Assistant

## 2018-11-02 DIAGNOSIS — M797 Fibromyalgia: Secondary | ICD-10-CM

## 2018-11-02 DIAGNOSIS — G894 Chronic pain syndrome: Secondary | ICD-10-CM

## 2018-11-02 DIAGNOSIS — M5137 Other intervertebral disc degeneration, lumbosacral region: Secondary | ICD-10-CM

## 2018-11-02 DIAGNOSIS — M545 Low back pain, unspecified: Secondary | ICD-10-CM

## 2018-11-03 ENCOUNTER — Ambulatory Visit: Payer: 59 | Admitting: Family Medicine

## 2018-11-05 ENCOUNTER — Other Ambulatory Visit: Payer: Self-pay | Admitting: Physician Assistant

## 2018-11-05 DIAGNOSIS — K2100 Gastro-esophageal reflux disease with esophagitis, without bleeding: Secondary | ICD-10-CM

## 2018-11-20 ENCOUNTER — Other Ambulatory Visit: Payer: Self-pay | Admitting: Physician Assistant

## 2018-11-23 ENCOUNTER — Other Ambulatory Visit: Payer: Self-pay | Admitting: Physician Assistant

## 2018-11-23 DIAGNOSIS — M62830 Muscle spasm of back: Secondary | ICD-10-CM

## 2018-11-23 DIAGNOSIS — G2581 Restless legs syndrome: Secondary | ICD-10-CM

## 2018-11-30 ENCOUNTER — Ambulatory Visit (INDEPENDENT_AMBULATORY_CARE_PROVIDER_SITE_OTHER): Payer: Medicare Other | Admitting: Physician Assistant

## 2018-11-30 ENCOUNTER — Other Ambulatory Visit: Payer: Self-pay | Admitting: Physician Assistant

## 2018-11-30 ENCOUNTER — Encounter: Payer: Self-pay | Admitting: Physician Assistant

## 2018-11-30 ENCOUNTER — Ambulatory Visit (INDEPENDENT_AMBULATORY_CARE_PROVIDER_SITE_OTHER): Payer: Medicare Other

## 2018-11-30 VITALS — BP 123/67 | HR 98 | Temp 98.6°F | Wt 199.0 lb

## 2018-11-30 DIAGNOSIS — M5441 Lumbago with sciatica, right side: Secondary | ICD-10-CM

## 2018-11-30 DIAGNOSIS — R0781 Pleurodynia: Secondary | ICD-10-CM

## 2018-11-30 DIAGNOSIS — I10 Essential (primary) hypertension: Secondary | ICD-10-CM

## 2018-11-30 DIAGNOSIS — M545 Low back pain, unspecified: Secondary | ICD-10-CM

## 2018-11-30 DIAGNOSIS — R0789 Other chest pain: Secondary | ICD-10-CM | POA: Diagnosis not present

## 2018-11-30 DIAGNOSIS — M5136 Other intervertebral disc degeneration, lumbar region: Secondary | ICD-10-CM

## 2018-11-30 DIAGNOSIS — M546 Pain in thoracic spine: Secondary | ICD-10-CM

## 2018-11-30 DIAGNOSIS — S2231XA Fracture of one rib, right side, initial encounter for closed fracture: Secondary | ICD-10-CM

## 2018-11-30 DIAGNOSIS — G894 Chronic pain syndrome: Secondary | ICD-10-CM

## 2018-11-30 DIAGNOSIS — Z780 Asymptomatic menopausal state: Secondary | ICD-10-CM

## 2018-11-30 DIAGNOSIS — M5442 Lumbago with sciatica, left side: Secondary | ICD-10-CM

## 2018-11-30 DIAGNOSIS — G8929 Other chronic pain: Secondary | ICD-10-CM

## 2018-11-30 MED ORDER — KETOROLAC TROMETHAMINE 30 MG/ML IJ SOLN
30.0000 mg | Freq: Once | INTRAMUSCULAR | Status: AC
  Administered 2018-11-30: 30 mg via INTRAMUSCULAR

## 2018-11-30 MED ORDER — HYDROCODONE-ACETAMINOPHEN 5-325 MG PO TABS: ORAL_TABLET | ORAL | 0 refills | Status: DC

## 2018-11-30 NOTE — Patient Instructions (Addendum)
Rib Fracture  A rib fracture is a break or crack in one of the bones of the ribs. The ribs are like a cage that goes around your upper chest. A broken or cracked rib is often painful, but most do not cause other problems. Most rib fractures usually heal on their own in 1-3 months. Follow these instructions at home: Managing pain, stiffness, and swelling  If directed, apply ice to the injured area. ? Put ice in a plastic bag. ? Place a towel between your skin and the bag. ? Leave the ice on for 20 minutes, 2-3 times a day.  Take over-the-counter and prescription medicines only as told by your doctor. Activity  Avoid activities that cause pain to the injured area. Protect your injured area.  Slowly increase activity as told by your doctor. General instructions  Do deep breathing as told by your doctor. You may be told to: ? Take deep breaths many times a day. ? Cough many times a day while hugging a pillow. ? Use a device (incentive spirometer) to do deep breathing many times a day.  Drink enough fluid to keep your pee (urine) clear or pale yellow.  Do not wear a rib belt or binder. These do not allow you to breathe deeply.  Keep all follow-up visits as told by your doctor. This is important. Contact a doctor if:  You have a fever. Get help right away if:  You have trouble breathing.  You are short of breath.  You cannot stop coughing.  You cough up thick or bloody spit (sputum).  You feel sick to your stomach (nauseous), throw up (vomit), or have belly (abdominal) pain.  Your pain gets worse and medicine does not help. Summary  A rib fracture is a break or crack in one of the bones of the ribs.  Apply ice to the injured area and take medicines for pain as told by your doctor.  Take deep breaths and cough many times a day. Hug a pillow every time you cough. This information is not intended to replace advice given to you by your health care provider. Make sure you  discuss any questions you have with your health care provider. Document Released: 06/25/2008 Document Revised: 12/17/2016 Document Reviewed: 12/17/2016 Elsevier Interactive Patient Education  2019 [place].   Rib Contusion A rib contusion is a deep bruise on your rib area. Contusions are the result of a blunt trauma that causes bleeding and injury to the tissues under the skin. A rib contusion may involve bruising of the ribs and of the skin and muscles in the area. The skin over the contusion may turn blue, purple, or yellow. Minor injuries will give you a painless contusion. More severe contusions may stay painful and swollen for a few weeks. What are the causes? This condition is usually caused by a blow, trauma, or direct force to an area of the body. This often occurs while playing contact sports. What are the signs or symptoms? Symptoms of this condition include:  Swelling and redness of the injured area.  Discoloration of the injured area.  Tenderness and soreness of the injured area.  Pain with or without movement. How is this diagnosed? This condition may be diagnosed based on:  Your symptoms and medical history.  A physical exam.  Imaging tests-such as an X-ray, CT scan, or MRI-to determine if there were internal injuries or broken bones (fractures). How is this treated? This condition may be treated with:  Rest.  This is often the best treatment for a rib contusion.  Icing. This reduces swelling and inflammation.  Deep-breathing exercises. These may be recommended to reduce the risk for lung collapse and pneumonia.  Medicines. Over-the-counter or prescription medicines may be given to control pain.  Injection of a numbing medicine around the nerve near your injury (nerve block). Follow these instructions at home:     Medicines  Take over-the-counter and prescription medicines only as told by your health care provider.  Do not drive or use heavy machinery  while taking prescription pain medicine.  If you are taking prescription pain medicine, take actions to prevent or treat constipation. Your health care provider may recommend that you: ? Drink enough fluid to keep your urine pale yellow. ? Eat foods that are high in fiber, such as fresh fruits and vegetables, whole grains, and beans. ? Limit foods that are high in fat and processed sugars, such as fried or sweet foods. ? Take an over-the-counter or prescription medicine for constipation. Managing pain, stiffness, and swelling  If directed, put ice on the injured area: ? Put ice in a plastic bag. ? Place a towel between your skin and the bag. ? Leave the ice on for 20 minutes, 2-3 times a day.  Rest the injured area. Avoid strenuous activity and any activities or movements that cause pain. Be careful during activities and avoid bumping the injured area.  Do not lift anything that is heavier than 5 lb (2.3 kg), or the limit that you are told, until your health care provider says that it is safe. General instructions  Do not use any products that contain nicotine or tobacco, such as cigarettes and e-cigarettes. These can delay healing. If you need help quitting, ask your health care provider.  Do deep-breathing exercises as told by your health care provider.  If you were given an incentive spirometer, use it every 1-2 hours while you are awake, or as recommended by your health care provider. This device measures how well you are filling your lungs with each breath.  Keep all follow-up visits as told by your health care provider. This is important. Contact a health care provider if you have:  Increased bruising or swelling.  Pain that is not controlled with treatment.  A fever. Get help right away if you:  Have difficulty breathing or shortness of breath.  Develop a continual cough or you cough up thick or bloody sputum.  Feel nauseous or you vomit.  Have pain in your  abdomen. Summary  A rib contusion is a deep bruise on your rib area. Contusions are the result of a blunt trauma that causes bleeding and injury to the tissues under the skin.  The skin overlying the contusion may turn blue, purple, or yellow. Minor injuries may give you a painless contusion. More severe contusions may stay painful and swollen for a few weeks.  Rest the injured area. Avoid strenuous activity and any activities or movements that cause pain. This information is not intended to replace advice given to you by your health care provider. Make sure you discuss any questions you have with your health care provider. Document Released: 06/11/2001 Document Revised: 10/15/2017 Document Reviewed: 10/15/2017 Elsevier Interactive Patient Education  [employer].

## 2018-11-30 NOTE — Progress Notes (Addendum)
Subjective:    Patient ID: Aimee Little, female    DOB: [birth year], 59 y.o.   MRN: [id number]  HPI  Aimee Little is a 58 yo female with PMH of lumbar DDD, chronic pain presents today with right sided chest pain that began 9 days ago after a fall. She was getting groceries from her car when she tripped over her feet fell in her right side. She describes it as sharp pain and rates it a 8/10. Coughing, sneezing and taking deep breathes makes the pain significantly worse. She has tried heat, meloxicam, and hot showers for relief. She denies hitting her head, loss of consciousness, bruising, swelling, numbness, or tingling.   .. Active Ambulatory Problems    Diagnosis Date Noted  . HOT FLASHES 12/29/2009  . [place] DISEASE, LUMBAR 12/29/2009  . Fibromyalgia 01/01/2013  . Unspecified vitamin D deficiency 05/16/2013  . Migraine without status migrainosus, not intractable 07/21/2013  . Chronic pain syndrome 11/05/2013  . Anxiety and depression 12/17/2013  . Adenomatous colon polyp 12/20/2013  . Obesity 11/25/2014  . Other fatigue 05/26/2015  . Bilateral low back pain without sciatica 09/06/2015  . RLS (restless legs syndrome) 09/22/2015  . Thyroid activity decreased 10/27/2015  . Insomnia 11/21/2015  . Severe episode of recurrent major depressive disorder, without psychotic features ([place]) 11/29/2015  . Rhinitis, allergic 11/29/2015  . Vaginal atrophy 01/02/2016  . Left knee pain 01/23/2016  . Panic attacks 03/19/2016  . Loose body in knee 04/17/2016  . Chondromalacia of patellofemoral joint 04/17/2016  . Diverticulitis of colon without hemorrhage 07/19/2016  . Nonintractable headache 03/06/2017  . Tachycardia 03/06/2017  . Rib pain on left side 08/03/2017  . Diverticulitis 09/10/2017  . Elevated blood pressure reading 10/01/2017  . Light tobacco smoker 10/01/2017  . Hypertension goal BP (blood pressure) < 130/80 10/01/2017  . Rib pain on right side 11/04/2017  . Traumatic hematoma of  eyelid, right, initial encounter 11/04/2017  . DDD (degenerative disc disease), lumbar 04/01/2018  . Acute bilateral low back pain with bilateral sciatica 04/01/2018  . OSA (obstructive sleep apnea) 05/29/2018  . Chronic bilateral thoracic back pain 08/04/2018   Resolved Ambulatory Problems    Diagnosis Date Noted  . Unspecified hypothyroidism 12/29/2009  . TOBACCO ABUSE 06/13/2010  . GRIEF REACTION, ACUTE 12/29/2009  . Depression 02/06/2010  . Osteoarthrosis involving lower leg 12/29/2009  . NAUSEA AND VOMITING 05/02/2010  . Osteoarthritis of left knee 07/21/2013  . Medication management 11/05/2013  . Nausea with vomiting 04/07/2014  . Lumbago 11/25/2014  . Sinusitis 04/13/2015  . Sacroiliac joint dysfunction of both sides 09/06/2015  . Right knee pain 11/07/2015  . GAD (generalized anxiety disorder) 03/19/2016  . Abdominal pain, left lower quadrant 04/25/2016  . Trochanteric bursitis of left hip 01/29/2017  . Low grade fever 04/01/2018   Past Medical History:  Diagnosis Date  . Migraine   . Thyroid disease   . Tobacco use       Review of Systems See HPI    Objective:   Physical Exam Constitutional:      Appearance: Normal appearance.  HENT:     Head: Normocephalic and atraumatic.     Right Ear: There is impacted cerumen.     Left Ear: There is impacted cerumen.     Nose: Nose normal.     Mouth/Throat:     Pharynx: Oropharynx is clear.  Eyes:     Conjunctiva/sclera: Conjunctivae normal.  Cardiovascular:     Rate and Rhythm: Normal rate and  regular rhythm.  Pulmonary:     Breath sounds: Normal breath sounds.  Musculoskeletal:        General: Tenderness present. No swelling or deformity.     Comments: Tenderness over right chest wall and under right breast. No rash, redness, bruising.   Bruising noted on bilateral knees.   Skin:    General: Skin is warm.     Capillary Refill: Capillary refill takes less than 2 seconds.  Neurological:     General: No  focal deficit present.     Mental Status: She is alert.  Psychiatric:        Mood and Affect: Mood normal.        Behavior: Behavior normal.           Assessment & Plan:  Marland KitchenMarland KitchenDiagnoses and all orders for this visit:  Rib pain on right side -     DG Ribs Unilateral W/Chest Right -     ketorolac (TORADOL) 30 MG/ML injection 30 mg  Right-sided chest wall pain -     DG Ribs Unilateral W/Chest Right  DDD (degenerative disc disease), lumbar -     HYDROcodone-acetaminophen (NORCO/VICODIN) 5-325 MG tablet; Take one tablet as needed for break through pain up to every four to six hours.  Acute bilateral low back pain with bilateral sciatica -     HYDROcodone-acetaminophen (NORCO/VICODIN) 5-325 MG tablet; Take one tablet as needed for break through pain up to every four to six hours.  Chronic pain syndrome -     HYDROcodone-acetaminophen (NORCO/VICODIN) 5-325 MG tablet; Take one tablet as needed for break through pain up to every four to six hours.  DISC DISEASE, LUMBAR -     HYDROcodone-acetaminophen (NORCO/VICODIN) 5-325 MG tablet; Take one tablet as needed for break through pain up to every four to six hours.  Acute bilateral low back pain without sciatica -     HYDROcodone-acetaminophen (NORCO/VICODIN) 5-325 MG tablet; Take one tablet as needed for break through pain up to every four to six hours.  Chronic bilateral thoracic back pain -     HYDROcodone-acetaminophen (NORCO/VICODIN) 5-325 MG tablet; Take one tablet as needed for break through pain up to every four to six hours.  will get chest and rib xray to confirm no fracture.  Discussed icing.  Continue mobic.  Toradol given in office today.   11/02/18 tramadol refilled.  Given norco for break through pain.  Marland Kitchen.PDMP reviewed during this encounter.   Marland KitchenVernetta Honey PA-C, have reviewed and agree with the above documentation in it's entirety.   AdddenduM: XRay confirmed fracture of the 7th right rib. Treatment plan stays  the same but we will get bone density to further assess fracture risk. Reminded of importance of vitamin D and calcium for bone strength.

## 2018-12-01 DIAGNOSIS — S2231XA Fracture of one rib, right side, initial encounter for closed fracture: Secondary | ICD-10-CM | POA: Insufficient documentation

## 2018-12-01 NOTE — Addendum Note (Signed)
Addended by: Donella Stade on: 12/01/2018 09:15 AM   Modules accepted: Orders

## 2018-12-01 NOTE — Progress Notes (Signed)
Called patient with results. Will get bone density ordered.

## 2019-01-11 ENCOUNTER — Other Ambulatory Visit: Payer: Self-pay

## 2019-01-13 ENCOUNTER — Telehealth: Payer: Self-pay | Admitting: Physician Assistant

## 2019-01-13 NOTE — Telephone Encounter (Signed)
Yes we will need a virtual visit to get prescription medications.

## 2019-01-13 NOTE — Telephone Encounter (Signed)
Pt advised. Per request, scheduled with Dr Georgina Snell for eval tomorrow via telephone. Denied face to face video call states she "hasn't showered and looks awful."

## 2019-01-13 NOTE — Telephone Encounter (Signed)
Pt called clinic stating she is having a fibro flare and the pain will not let up. Requesting refill on hydrocodone and and rx for prednisone. States she does not want to come into the office but will do a virtual visit if needed. Routing.

## 2019-01-14 ENCOUNTER — Ambulatory Visit (INDEPENDENT_AMBULATORY_CARE_PROVIDER_SITE_OTHER): Payer: Medicare Other | Admitting: Family Medicine

## 2019-01-14 ENCOUNTER — Encounter: Payer: Self-pay | Admitting: Family Medicine

## 2019-01-14 VITALS — Ht 70.0 in | Wt 199.0 lb

## 2019-01-14 DIAGNOSIS — M797 Fibromyalgia: Secondary | ICD-10-CM | POA: Diagnosis not present

## 2019-01-14 MED ORDER — HYDROCODONE-ACETAMINOPHEN 5-325 MG PO TABS: 1.0000 | ORAL_TABLET | Freq: Four times a day (QID) | ORAL | 0 refills | Status: DC | PRN

## 2019-01-14 MED ORDER — PREDNISONE 50 MG PO TABS: 50.0000 mg | ORAL_TABLET | Freq: Every day | ORAL | 0 refills | Status: DC

## 2019-01-14 NOTE — Patient Instructions (Signed)
Thank you for coming in today. Take the short course of prednisone and use norco limited.

## 2019-01-14 NOTE — Progress Notes (Addendum)
Virtual Visit  via Phone Note  I connected with      Aimee Little  by a telemedicine application and verified that I am speaking with the correct person using two identifiers.   I discussed the limitations of evaluation and management by telemedicine and the availability of in person appointments. The patient expressed understanding and agreed to proceed.  History of Present Illness: Aimee Little is a 58 y.o. female who would like to discuss fibromyalgia flareup starting about a day ago.  Patient having medial groin pain, back and neck pain, as well as bilateral feet pain.  Symptoms are consistent with prior episodes of fibromyalgia.  Patient has tried treatment with heat, and her existing chronic medications.  Symptoms are not improving sufficiently.  She notes her symptoms are quite bothersome.  She tried taking a leftover hydrocodone which did help temporarily.  In the past she is done well with Toradol and steroid injections but cannot do that now due to COVID-19 pandemic.     Observations/Objective: Ht 5\' 10"  (1.778 m)   Wt 199 lb (90.3 kg)   BMI 28.55 kg/m  Wt Readings from Last 5 Encounters:  01/14/19 199 lb (90.3 kg)  11/30/18 199 lb (90.3 kg)  08/04/18 207 lb (93.9 kg)  07/22/18 207 lb (93.9 kg)  06/08/18 204 lb (92.5 kg)   Exam: Normal Speech.  Psych: Alert and oriented.  Affect is tearful.  No SI or HI expressed.  Thought process normal.  Normal speech.    Assessment and Plan: 58 y.o. female with increased pain consistent with previous episodes of fibromyalgia flareups.  Typically in clinic will treat this with intramuscular injection of steroid and Toradol.  However this is not a good option with COVID-19 pandemic.  Plan to treat with short course of oral steroids and limited hydrocodone.  Continue NSAIDs as well in limited fashion.  Recheck if not improving.  PDMP reviewed during this encounter. No orders of the defined types were placed in this  encounter.  Meds ordered this encounter  Medications  . predniSONE (DELTASONE) 50 MG tablet    Sig: Take 1 tablet (50 mg total) by mouth daily.    Dispense:  5 tablet    Refill:  0  . HYDROcodone-acetaminophen (NORCO/VICODIN) 5-325 MG tablet    Sig: Take 1 tablet by mouth every 6 (six) hours as needed.    Dispense:  15 tablet    Refill:  0    Follow Up Instructions:    I discussed the assessment and treatment plan with the patient. The patient was provided an opportunity to ask questions and all were answered. The patient agreed with the plan and demonstrated an understanding of the instructions.   The patient was advised to call back or seek an in-person evaluation if the symptoms worsen or if the condition fails to improve as anticipated.  I provided 25 minutes of non-face-to-face time during this encounter.    Historical information moved to improve visibility of documentation.  Past Medical History:  Diagnosis Date  . DDD (degenerative disc disease), lumbar   . Depression   . Fibromyalgia   . Insomnia   . Migraine   . Thyroid disease   . Tobacco use    Past Surgical History:  Procedure Laterality Date  . ABDOMINAL HYSTERECTOMY     took uterus and both ovaries left   Social History   Tobacco Use  . Smoking status: Light Tobacco Smoker    Packs/day:  0.25    Last attempt to quit: 10/02/2013    Years since quitting: 5.2  . Smokeless tobacco: Never Used  Substance Use Topics  . Alcohol use: No    Frequency: Never   family history includes Alcohol abuse in her brother; Depression in her mother; Diabetes in her sister; Heart attack in her father; Hyperlipidemia in her mother; Hypertension in her brother, mother, and sister.  Medications: Current Outpatient Medications  Medication Sig Dispense Refill  . ALPRAZolam (XANAX) 0.5 MG tablet Take 1 tablet (0.5 mg total) by mouth at bedtime as needed for anxiety. 30 tablet 2  . AMBULATORY NON FORMULARY MEDICATION shingrx  2 doses to prevent shingles. 2 application 0  . AMBULATORY NON FORMULARY MEDICATION Tens unit for lumbar spine for low back pain, muscle spasm, lumbar DDD. 1 Device 0  . amLODipine (NORVASC) 10 MG tablet Take 1 tablet (10 mg total) by mouth daily. NEEDS APPOINTMENT 90 tablet 0  . aspirin EC 81 MG tablet Take 1 tablet (81 mg total) by mouth daily. 90 tablet 3  . botulinum toxin Type A (BOTOX) 100 UNITS SOLR injection Inject 100 Units into the muscle.    . busPIRone (BUSPAR) 15 MG tablet TAKE ONE TABLET BY MOUTH 2 TIMES A DAY 180 tablet 1  . cefdinir (OMNICEF) 300 MG capsule Take 1 capsule (300 mg total) by mouth 2 (two) times daily. For 10 days. 20 capsule 0  . cyclobenzaprine (FLEXERIL) 10 MG tablet Take 1 tablet (10 mg total) by mouth 3 (three) times daily as needed for muscle spasms. 180 tablet 0  . DEXILANT 60 MG capsule Take 1 capsule (60 mg total) by mouth daily. 90 capsule 1  . famotidine (PEPCID) 20 MG tablet Take 1 tablet (20 mg total) by mouth 2 (two) times daily. 60 tablet 11  . fluticasone (FLONASE) 50 MCG/ACT nasal spray USE 1 TO 2 SPRAYS IN EACH NOSTRIL ONCE DAILY. 16 g 6  . gabapentin (NEURONTIN) 300 MG capsule TAKE 1 CAPSULES BY MOUTH 3 TIMES A DAY AS NEEDED FOR PAIN/NEUROPATHY 270 capsule 4  . hydrochlorothiazide (HYDRODIURIL) 25 MG tablet Take 1 tablet (25 mg total) by mouth daily. 30 tablet 2  . hydrOXYzine (ATARAX/VISTARIL) 25 MG tablet Take 1 tablet (25 mg total) by mouth 3 (three) times daily as needed for anxiety. 90 tablet 5  . ibuprofen (ADVIL,MOTRIN) 200 MG tablet 2 tabs by mouth twice a day as needed    . levothyroxine (SYNTHROID, LEVOTHROID) 150 MCG tablet TAKE ONE TABLET BY MOUTH EVERY DAY BEFORE BREAKFAST. 90 tablet 3  . meloxicam (MOBIC) 15 MG tablet Take 1 tablet (15 mg total) by mouth daily. 90 tablet 1  . metoCLOPramide (REGLAN) 10 MG tablet TAKE 1/2 TO 1 TABLET BY MOUTH EVERY 8 HOURS AS NEEDED FOR NAUSEA/HEADACHE. 90 tablet 3  . metoCLOPramide (REGLAN) 10 MG tablet  Take 1 tablet (10 mg total) by mouth 4 (four) times daily -  before meals and at bedtime. 120 tablet 0  . Misc. Devices MISC Initiate Bipap 8/4 cm to 16/12cm water pressure. New mask and supplies through D[facility].    . Omega 3-6-9 Fatty Acids (OMEGA 3-6-9 COMPLEX PO) Take by mouth.    . rizatriptan (MAXALT-MLT) 10 MG disintegrating tablet TAKE ONE TABLET BY MOUTH AS NEEDED. MAY REPEAT IN 2 HOURS IF NEEDED. 30 tablet 1  . rOPINIRole (REQUIP) 1 MG tablet TAKE ONE TABLET BY MOUTH 3 TIMES A DAY. Needs follow up 270 tablet 0  . temazepam (RESTORIL)  15 MG capsule Take 1 capsule (15 mg total) by mouth at bedtime as needed for sleep. 30 capsule 2  . topiramate (TOPAMAX) 100 MG tablet TAKE ONE TABLET BY MOUTH 2 TIMES A DAY 180 tablet 1  . traMADol (ULTRAM) 50 MG tablet TAKE TWO TABLETS BY MOUTH TWICE DAILY AS NEEDED FOR MODERATE TO SEVERE PAIN 120 tablet 0  . valACYclovir (VALTREX) 1000 MG tablet TAKE TWO TABLETS 2 TIMES A DAY AT ONSET OF COLD SORE FOR 2 DAYS 60 tablet 0  . Vitamin D, Ergocalciferol, (DRISDOL) 50000 units CAPS capsule Take 1 capsule (50,000 Units total) by mouth every 7 (seven) days. 12 capsule 4  . VITAMIN E PO Take by mouth.    Marland Kitchen HYDROcodone-acetaminophen (NORCO/VICODIN) 5-325 MG tablet Take 1 tablet by mouth every 6 (six) hours as needed. 15 tablet 0  . predniSONE (DELTASONE) 50 MG tablet Take 1 tablet (50 mg total) by mouth daily. 5 tablet 0   No current facility-administered medications for this visit.    Allergies  Allergen Reactions  . Doxepin Other (See Comments)  . Pristiq [Desvenlafaxine Succinate Er]     Depression worse.  . Estradiol Other (See Comments)    Burning   . Gabapentin     Sleep walking  . Levothyroxine     Other reaction(s): Other (See Comments) Reaction unknown  . Lexapro [Escitalopram Oxalate]     jittery  . Lisinopril     cough  . Losartan     itching  . Mirapex [Pramipexole Dihydrochloride]     Nausea and vomiting  . Phentermine Diarrhea     Stomach ache/increased anxiety.   . Pregabalin   . Pristiq [Desvenlafaxine] Other (See Comments)    Increased depression  . Seroquel [Quetiapine Fumarate]     hallicinations  . Trintellix [Vortioxetine]     RLS/increased anxiety   Addendum to correct date error due to templating issue

## 2019-01-27 ENCOUNTER — Other Ambulatory Visit: Payer: Medicare Other

## 2019-02-01 ENCOUNTER — Other Ambulatory Visit: Payer: Self-pay | Admitting: Physician Assistant

## 2019-02-04 ENCOUNTER — Other Ambulatory Visit: Payer: Self-pay | Admitting: Physician Assistant

## 2019-02-04 DIAGNOSIS — G894 Chronic pain syndrome: Secondary | ICD-10-CM

## 2019-02-04 DIAGNOSIS — M797 Fibromyalgia: Secondary | ICD-10-CM

## 2019-02-04 DIAGNOSIS — M545 Low back pain, unspecified: Secondary | ICD-10-CM

## 2019-02-04 DIAGNOSIS — M51379 Other intervertebral disc degeneration, lumbosacral region without mention of lumbar back pain or lower extremity pain: Secondary | ICD-10-CM

## 2019-02-04 DIAGNOSIS — M5137 Other intervertebral disc degeneration, lumbosacral region: Secondary | ICD-10-CM

## 2019-02-05 ENCOUNTER — Ambulatory Visit (INDEPENDENT_AMBULATORY_CARE_PROVIDER_SITE_OTHER): Payer: Medicare Other | Admitting: Physician Assistant

## 2019-02-05 VITALS — Ht 70.0 in | Wt 204.0 lb

## 2019-02-05 DIAGNOSIS — F332 Major depressive disorder, recurrent severe without psychotic features: Secondary | ICD-10-CM

## 2019-02-05 DIAGNOSIS — F419 Anxiety disorder, unspecified: Secondary | ICD-10-CM

## 2019-02-05 DIAGNOSIS — F329 Major depressive disorder, single episode, unspecified: Secondary | ICD-10-CM

## 2019-02-05 DIAGNOSIS — F5101 Primary insomnia: Secondary | ICD-10-CM | POA: Diagnosis not present

## 2019-02-05 DIAGNOSIS — G2581 Restless legs syndrome: Secondary | ICD-10-CM

## 2019-02-05 DIAGNOSIS — F32A Depression, unspecified: Secondary | ICD-10-CM

## 2019-02-05 MED ORDER — ROPINIROLE HCL 1 MG PO TABS: ORAL_TABLET | ORAL | 1 refills | Status: DC

## 2019-02-05 MED ORDER — TRAZODONE HCL 100 MG PO TABS: ORAL_TABLET | ORAL | 1 refills | Status: DC

## 2019-02-05 MED ORDER — CITALOPRAM HYDROBROMIDE 20 MG PO TABS: 20.0000 mg | ORAL_TABLET | Freq: Every day | ORAL | 1 refills | Status: DC

## 2019-02-05 NOTE — Progress Notes (Signed)
Patient can't afford psychiatrist because insurance changed and wants to know if you can take over prescribing medications. PHQ9-GAD7 completed.

## 2019-02-08 ENCOUNTER — Encounter: Payer: Self-pay | Admitting: Physician Assistant

## 2019-02-08 ENCOUNTER — Ambulatory Visit: Payer: Medicare Other | Admitting: Physician Assistant

## 2019-02-08 NOTE — Progress Notes (Signed)
Patient ID: Aimee Little, female   DOB: 15-Nov-1960, 58 y.o.   MRN: [id number] .Marland KitchenVirtual Visit via Telephone Note  I connected with Oreta B Zenz on 02/08/19 at 11:30 AM EDT by telephone and verified that I am speaking with the correct person using two identifiers.  Location: Patient: home Provider: clinic   I discussed the limitations, risks, security and privacy concerns of performing an evaluation and management service by telephone and the availability of in person appointments. I also discussed with the patient that there may be a patient responsible charge related to this service. The patient expressed understanding and agreed to proceed.   History of Present Illness: Pt is a 58 yo female with HTN, migraines, Fibromyalgia, hypothyroidism, chronic back pain, MDD, GAD who calls in to the clinic to discuss medications. She is see psychiatry currently but cannot afford co-pays and does not feel like helping and would like for me to take over medications.   She actually tried her friends celexa and doing really good with depression and anxiety. Would like to continue this. Continues on buspar. Pain is off and on with flares. She is not very active right now due to [place]. She is not working. She is sleeping pretty good with trazodone right now. No SI/HC.   Marland Kitchen. Active Ambulatory Problems    Diagnosis Date Noted  . HOT FLASHES 12/29/2009  . [place] DISEASE, LUMBAR 12/29/2009  . Fibromyalgia 01/01/2013  . Unspecified vitamin D deficiency 05/16/2013  . Migraine without status migrainosus, not intractable 07/21/2013  . Chronic pain syndrome 11/05/2013  . Anxiety and depression 12/17/2013  . Adenomatous colon polyp 12/20/2013  . Obesity 11/25/2014  . Other fatigue 05/26/2015  . Bilateral low back pain without sciatica 09/06/2015  . RLS (restless legs syndrome) 09/22/2015  . Thyroid activity decreased 10/27/2015  . Insomnia 11/21/2015  . Severe episode of recurrent major depressive disorder,  without psychotic features ([place]) 11/29/2015  . Rhinitis, allergic 11/29/2015  . Vaginal atrophy 01/02/2016  . Left knee pain 01/23/2016  . Panic attacks 03/19/2016  . Loose body in knee 04/17/2016  . Chondromalacia of patellofemoral joint 04/17/2016  . Diverticulitis of colon without hemorrhage 07/19/2016  . Nonintractable headache 03/06/2017  . Tachycardia 03/06/2017  . Rib pain on left side 08/03/2017  . Diverticulitis 09/10/2017  . Elevated blood pressure reading 10/01/2017  . Light tobacco smoker 10/01/2017  . Hypertension goal BP (blood pressure) < 130/80 10/01/2017  . Rib pain on right side 11/04/2017  . Traumatic hematoma of eyelid, right, initial encounter 11/04/2017  . DDD (degenerative disc disease), lumbar 04/01/2018  . Acute bilateral low back pain with bilateral sciatica 04/01/2018  . OSA (obstructive sleep apnea) 05/29/2018  . Chronic bilateral thoracic back pain 08/04/2018  . Closed fracture of one rib of right side 12/01/2018   Resolved Ambulatory Problems    Diagnosis Date Noted  . Unspecified hypothyroidism 12/29/2009  . TOBACCO ABUSE 06/13/2010  . GRIEF REACTION, ACUTE 12/29/2009  . Depression 02/06/2010  . Osteoarthrosis involving lower leg 12/29/2009  . NAUSEA AND VOMITING 05/02/2010  . Osteoarthritis of left knee 07/21/2013  . Medication management 11/05/2013  . Nausea with vomiting 04/07/2014  . Lumbago 11/25/2014  . Sinusitis 04/13/2015  . Sacroiliac joint dysfunction of both sides 09/06/2015  . Right knee pain 11/07/2015  . GAD (generalized anxiety disorder) 03/19/2016  . Abdominal pain, left lower quadrant 04/25/2016  . Trochanteric bursitis of left hip 01/29/2017  . Low grade fever 04/01/2018   Past Medical History:  Diagnosis Date  . Migraine   . Thyroid disease   . Tobacco use    Reviewed med, allergy, problem list.     Observations/Objective: No acute distress.  Normal breathing.  Normal mood.   .. Today's Vitals   02/05/19 1015   Weight: 204 lb (92.5 kg)  Height: 5\' 10"  (1.778 m)   Body mass index is 29.27 kg/m. .. Depression screen [facility] 2/9 02/05/2019 09/16/2017 09/10/2017 05/20/2017 12/04/2016  Decreased Interest 1 3 2 3 1   Down, Depressed, Hopeless 1 3 2 3 1   PHQ - 2 Score 2 6 4 6 2   Altered sleeping 1 3 3 2 2   Tired, decreased energy 3 3 3 3 2   Change in appetite 1 3 3 2  0  Feeling bad or failure about yourself  0 2 3 1  0  Trouble concentrating 1 2 1 3  0  Moving slowly or fidgety/restless 1 2 2 3  0  Suicidal thoughts 0 0 1 0 0  PHQ-9 Score 9 21 20 20 6   Difficult doing work/chores Not difficult at all Very difficult - - -   .Marland Kitchen GAD 7 : Generalized Anxiety Score 02/05/2019 09/16/2017 09/10/2017  Nervous, Anxious, on Edge 2 3 2   Control/stop worrying 1 2 3   Worry too much - different things 1 2 3   Trouble relaxing 1 3 3   Restless 1 1 3   Easily annoyed or irritable 0 1 2  Afraid - awful might happen 2 3 1   Total GAD 7 Score 8 15 17   Anxiety Difficulty Not difficult at all - -     Assessment and Plan: Marland KitchenMarland KitchenDiagnoses and all orders for this visit:  Primary insomnia -     traZODone (DESYREL) 100 MG tablet; Take one tablet (100 mg dose) by mouth at bedtime as needed for Sleep.  RLS (restless legs syndrome) -     rOPINIRole (REQUIP) 1 MG tablet; TAKE ONE TABLET BY MOUTH 3 TIMES A DAY.  Anxiety and depression -     citalopram (CELEXA) 20 MG tablet; Take 1 tablet (20 mg total) by mouth daily.  Severe episode of recurrent major depressive disorder, without psychotic features ([place]) -     citalopram (CELEXA) 20 MG tablet; Take 1 tablet (20 mg total) by mouth daily.  PHQ-9 and GAD-7 looks much better. Refilled celexa and other sleep RLS medications. I will take over psychiatry medications.   Discussed COVID anxiety and ways to help with this.    Follow Up Instructions:    I discussed the assessment and treatment plan with the patient. The patient was provided an opportunity to ask questions and all were  answered. The patient agreed with the plan and demonstrated an understanding of the instructions.   The patient was advised to call back or seek an in-person evaluation if the symptoms worsen or if the condition fails to improve as anticipated.  I provided 15 minutes of non-face-to-face time during this encounter.   Iran Planas, PA-C

## 2019-02-10 ENCOUNTER — Ambulatory Visit: Payer: Medicare Other | Admitting: Physician Assistant

## 2019-02-11 ENCOUNTER — Other Ambulatory Visit: Payer: Self-pay | Admitting: Physician Assistant

## 2019-02-11 DIAGNOSIS — G43909 Migraine, unspecified, not intractable, without status migrainosus: Secondary | ICD-10-CM

## 2019-02-17 ENCOUNTER — Other Ambulatory Visit: Payer: Medicare Other

## 2019-02-18 ENCOUNTER — Other Ambulatory Visit: Payer: Self-pay | Admitting: Physician Assistant

## 2019-02-19 ENCOUNTER — Ambulatory Visit (INDEPENDENT_AMBULATORY_CARE_PROVIDER_SITE_OTHER): Payer: Medicare Other | Admitting: Family Medicine

## 2019-02-19 ENCOUNTER — Encounter: Payer: Self-pay | Admitting: Family Medicine

## 2019-02-19 VITALS — Ht 70.0 in | Wt 204.0 lb

## 2019-02-19 DIAGNOSIS — F419 Anxiety disorder, unspecified: Secondary | ICD-10-CM | POA: Diagnosis not present

## 2019-02-19 DIAGNOSIS — F329 Major depressive disorder, single episode, unspecified: Secondary | ICD-10-CM | POA: Diagnosis not present

## 2019-02-19 DIAGNOSIS — F410 Panic disorder [episodic paroxysmal anxiety] without agoraphobia: Secondary | ICD-10-CM | POA: Diagnosis not present

## 2019-02-19 DIAGNOSIS — F32A Depression, unspecified: Secondary | ICD-10-CM

## 2019-02-19 MED ORDER — ALPRAZOLAM 0.5 MG PO TABS: 0.5000 mg | ORAL_TABLET | Freq: Two times a day (BID) | ORAL | 1 refills | Status: DC | PRN

## 2019-02-19 NOTE — Progress Notes (Signed)
Virtual Visit  I connected with      Aimee Little  by a telemedicine application and verified that I am speaking with the correct person using two identifiers.   I discussed the limitations of evaluation and management by telemedicine and the availability of in person appointments. The patient expressed understanding and agreed to proceed.  History of Present Illness: Aimee Little is a 58 y.o. female who would like to discuss panic attacks and anxiety.   Aimee Little has an existing history of anxiety disorder.  She notes this is typically pretty well controlled at baseline.  She has hydroxyzine that she can take if needed for panic attacks that typically work okay.  However get yesterday she had a series of bad panic attacks that have continued to today.  She thinks because is been raining she has been unable to get out and exercise or walk and has been sitting at home watching a lot of television about COVID-19.  She thinks that made her very anxious.  Additionally she notes that 1 of her relatives lost her job and she is worried about her relative as well.  In the past she is had a prescription for Xanax that she could take very intermittently.  She notes is been quite a while since she is ever needed it.  She notes she think she could benefit from Xanax at this point.   Observations/Objective: Wt 204 lb (92.5 kg)   BMI 29.27 kg/m  Wt Readings from Last 5 Encounters:  02/19/19 204 lb (92.5 kg)  02/05/19 204 lb (92.5 kg)  01/14/19 199 lb (90.3 kg)  11/30/18 199 lb (90.3 kg)  08/04/18 207 lb (93.9 kg)   Exam: Normal Speech.  Psych alert and oriented normal speech thought process and verbal affect.  No SI or HI expressed.    Assessment and Plan: 58 y.o. female with anxiety: Much worse panic attacks today and yesterday.  This likely reflects not a fundamental change in her baseline mental health but a particularly bad day.  I think it is likely to be short-lived.  Reasonable to  use existing prescription of low-dose intermittent Xanax.  Will refill as she has run out a while ago.  Cautioned against using higher doses of Xanax along with opiates.  She expresses understanding and agreement will check back if not improving.  PDMP not reviewed this encounter. No orders of the defined types were placed in this encounter.  No orders of the defined types were placed in this encounter.   Follow Up Instructions:    I discussed the assessment and treatment plan with the patient. The patient was provided an opportunity to ask questions and all were answered. The patient agreed with the plan and demonstrated an understanding of the instructions.   The patient was advised to call back or seek an in-person evaluation if the symptoms worsen or if the condition fails to improve as anticipated.  Time: 15 minutes of intraservice time, with >22 minutes of total time during today's visit.      Historical information moved to improve visibility of documentation.  Past Medical History:  Diagnosis Date  . DDD (degenerative disc disease), lumbar   . Depression   . Fibromyalgia   . Insomnia   . Migraine   . Thyroid disease   . Tobacco use    Past Surgical History:  Procedure Laterality Date  . ABDOMINAL HYSTERECTOMY     took uterus and both ovaries left  Social History   Tobacco Use  . Smoking status: Light Tobacco Smoker    Packs/day: 0.25    Last attempt to quit: 10/02/2013    Years since quitting: 5.3  . Smokeless tobacco: Never Used  Substance Use Topics  . Alcohol use: No    Frequency: Never   family history includes Alcohol abuse in her brother; Depression in her mother; Diabetes in her sister; Heart attack in her father; Hyperlipidemia in her mother; Hypertension in her brother, mother, and sister.  Medications: Current Outpatient Medications  Medication Sig Dispense Refill  . ALPRAZolam (XANAX) 0.5 MG tablet Take 1 tablet (0.5 mg total) by mouth at bedtime  as needed for anxiety. 30 tablet 2  . AMBULATORY NON FORMULARY MEDICATION Tens unit for lumbar spine for low back pain, muscle spasm, lumbar DDD. 1 Device 0  . amLODipine (NORVASC) 10 MG tablet Take 1 tablet (10 mg total) by mouth daily. NEEDS APPOINTMENT 90 tablet 0  . busPIRone (BUSPAR) 15 MG tablet TAKE ONE TABLET BY MOUTH 2 TIMES A DAY 180 tablet 1  . cholecalciferol (VITAMIN D3) 25 MCG (1000 UT) tablet Take 1,000 Units by mouth daily.    . citalopram (CELEXA) 20 MG tablet Take 1 tablet (20 mg total) by mouth daily. 90 tablet 1  . cyclobenzaprine (FLEXERIL) 10 MG tablet Take 1 tablet (10 mg total) by mouth 3 (three) times daily as needed for muscle spasms. 180 tablet 0  . DEXILANT 60 MG capsule Take 1 capsule (60 mg total) by mouth daily. 90 capsule 1  . famotidine (PEPCID) 20 MG tablet Take 1 tablet (20 mg total) by mouth 2 (two) times daily. 60 tablet 11  . fluticasone (FLONASE) 50 MCG/ACT nasal spray USE 1 TO 2 SPRAYS IN EACH NOSTRIL ONCE DAILY. 16 g 6  . gabapentin (NEURONTIN) 300 MG capsule TAKE 1 CAPSULES BY MOUTH 3 TIMES A DAY AS NEEDED FOR PAIN/NEUROPATHY 270 capsule 4  . hydrochlorothiazide (HYDRODIURIL) 25 MG tablet Take 1 tablet (25 mg total) by mouth daily. 30 tablet 2  . HYDROcodone-acetaminophen (NORCO/VICODIN) 5-325 MG tablet Take 1 tablet by mouth every 6 (six) hours as needed. 15 tablet 0  . hydrOXYzine (ATARAX/VISTARIL) 25 MG tablet Take 1 tablet (25 mg total) by mouth 3 (three) times daily as needed for anxiety. 90 tablet 5  . levothyroxine (SYNTHROID) 150 MCG tablet TAKE ONE TABLET BY MOUTH EVERY DAY BEFORE BREAKFAST. 90 tablet 1  . meloxicam (MOBIC) 15 MG tablet Take 1 tablet (15 mg total) by mouth daily. 90 tablet 1  . metoCLOPramide (REGLAN) 10 MG tablet TAKE 1/2 TO 1 TABLET BY MOUTH EVERY 8 HOURS AS NEEDED FOR NAUSEA/HEADACHE. 90 tablet 3  . Misc. Devices MISC Initiate Bipap 8/4 cm to 16/12cm water pressure. New mask and supplies through D[facility].    . Omega 3-6-9  Fatty Acids (OMEGA 3-6-9 COMPLEX PO) Take by mouth.    . rizatriptan (MAXALT-MLT) 10 MG disintegrating tablet TAKE ONE TABLET BY MOUTH AS NEEDED. MAY REPEAT IN 2 HOURS IF NEEDED. 30 tablet 1  . rOPINIRole (REQUIP) 1 MG tablet TAKE ONE TABLET BY MOUTH 3 TIMES A DAY. 270 tablet 1  . topiramate (TOPAMAX) 100 MG tablet TAKE ONE TABLET BY MOUTH 2 TIMES A DAY 180 tablet 1  . traMADol (ULTRAM) 50 MG tablet TAKE TWO TABLETS BY MOUTH TWICE DAILY AS NEEDED FOR MODERATE TO SEVERE PAIN 120 tablet 0  . traZODone (DESYREL) 100 MG tablet Take one tablet (100 mg dose) by mouth  at bedtime as needed for Sleep. 90 tablet 1  . valACYclovir (VALTREX) 1000 MG tablet TAKE TWO TABLETS 2 TIMES A DAY AT ONSET OF COLD SORE FOR 2 DAYS 60 tablet 0  . VITAMIN E PO Take by mouth.     No current facility-administered medications for this visit.    Allergies  Allergen Reactions  . Doxepin Other (See Comments)  . Pristiq [Desvenlafaxine Succinate Er]     Depression worse.  . Estradiol Other (See Comments)    Burning   . Gabapentin     Sleep walking  . Levothyroxine     Other reaction(s): Other (See Comments) Reaction unknown  . Lexapro [Escitalopram Oxalate]     jittery  . Lisinopril     cough  . Losartan     itching  . Mirapex [Pramipexole Dihydrochloride]     Nausea and vomiting  . Phentermine Diarrhea    Stomach ache/increased anxiety.   . Pregabalin   . Pristiq [Desvenlafaxine] Other (See Comments)    Increased depression  . Seroquel [Quetiapine Fumarate]     hallicinations  . Trintellix [Vortioxetine]     RLS/increased anxiety

## 2019-02-19 NOTE — Patient Instructions (Signed)
Thank you for coming in today. Use xanax very sparingly for severe anxiety attacks.  Keep [place] updated. We can see you as needed.  There are on call doctors every day.

## 2019-02-23 ENCOUNTER — Other Ambulatory Visit: Payer: Self-pay | Admitting: Physician Assistant

## 2019-02-23 DIAGNOSIS — I10 Essential (primary) hypertension: Secondary | ICD-10-CM

## 2019-02-24 ENCOUNTER — Other Ambulatory Visit: Payer: Medicare Other

## 2019-03-17 ENCOUNTER — Other Ambulatory Visit: Payer: Medicare Other

## 2019-03-24 ENCOUNTER — Encounter: Payer: Self-pay | Admitting: Physician Assistant

## 2019-03-24 ENCOUNTER — Ambulatory Visit (INDEPENDENT_AMBULATORY_CARE_PROVIDER_SITE_OTHER): Payer: Medicare Other

## 2019-03-24 ENCOUNTER — Other Ambulatory Visit: Payer: Self-pay

## 2019-03-24 DIAGNOSIS — Z780 Asymptomatic menopausal state: Secondary | ICD-10-CM | POA: Diagnosis not present

## 2019-03-24 DIAGNOSIS — M8580 Other specified disorders of bone density and structure, unspecified site: Secondary | ICD-10-CM | POA: Insufficient documentation

## 2019-03-24 DIAGNOSIS — S2231XA Fracture of one rib, right side, initial encounter for closed fracture: Secondary | ICD-10-CM

## 2019-03-24 NOTE — Progress Notes (Signed)
Call pt: she is osteopenic with low bone mass. Make sure taking vitamin D and calcium daily.

## 2019-04-19 ENCOUNTER — Other Ambulatory Visit: Payer: Self-pay | Admitting: Physician Assistant

## 2019-04-19 DIAGNOSIS — G894 Chronic pain syndrome: Secondary | ICD-10-CM

## 2019-04-19 DIAGNOSIS — M545 Low back pain, unspecified: Secondary | ICD-10-CM

## 2019-04-19 DIAGNOSIS — M51379 Other intervertebral disc degeneration, lumbosacral region without mention of lumbar back pain or lower extremity pain: Secondary | ICD-10-CM

## 2019-04-19 DIAGNOSIS — M797 Fibromyalgia: Secondary | ICD-10-CM

## 2019-04-19 DIAGNOSIS — M5137 Other intervertebral disc degeneration, lumbosacral region: Secondary | ICD-10-CM

## 2019-04-20 ENCOUNTER — Other Ambulatory Visit: Payer: Self-pay | Admitting: Physician Assistant

## 2019-04-20 DIAGNOSIS — M5137 Other intervertebral disc degeneration, lumbosacral region: Secondary | ICD-10-CM

## 2019-04-20 DIAGNOSIS — M545 Low back pain, unspecified: Secondary | ICD-10-CM

## 2019-04-20 DIAGNOSIS — G894 Chronic pain syndrome: Secondary | ICD-10-CM

## 2019-04-20 DIAGNOSIS — M797 Fibromyalgia: Secondary | ICD-10-CM

## 2019-04-20 NOTE — Telephone Encounter (Signed)
Patient states it isn't too soon.

## 2019-04-24 ENCOUNTER — Other Ambulatory Visit: Payer: Self-pay | Admitting: Family Medicine

## 2019-04-24 DIAGNOSIS — F329 Major depressive disorder, single episode, unspecified: Secondary | ICD-10-CM

## 2019-04-24 DIAGNOSIS — F32A Depression, unspecified: Secondary | ICD-10-CM

## 2019-04-26 NOTE — Telephone Encounter (Signed)
Last visit was 01/2019. Please advise.

## 2019-05-07 ENCOUNTER — Telehealth: Payer: Self-pay

## 2019-05-07 MED ORDER — PREDNISONE 50 MG PO TABS: ORAL_TABLET | ORAL | 0 refills | Status: DC

## 2019-05-07 MED ORDER — HYDROCODONE-ACETAMINOPHEN 5-325 MG PO TABS: 1.0000 | ORAL_TABLET | Freq: Four times a day (QID) | ORAL | 0 refills | Status: DC | PRN

## 2019-05-07 MED ORDER — LEVOTHYROXINE SODIUM 150 MCG PO TABS: 150.0000 ug | ORAL_TABLET | Freq: Every day | ORAL | 0 refills | Status: DC

## 2019-05-07 NOTE — Telephone Encounter (Signed)
Aimee Little called and states she is having a terrible fibromyalgia flare up. She is wanting a short prescription of Hydrocodone and also wants prednisone.

## 2019-05-07 NOTE — Telephone Encounter (Signed)
Patient made aware. Will call back if no better.

## 2019-05-07 NOTE — Telephone Encounter (Signed)
fibromyaglia flare. No appts for today and the weekend. Sent prednisone for 5 days and few norco but usually we will need appt.

## 2019-05-11 ENCOUNTER — Other Ambulatory Visit: Payer: Self-pay | Admitting: Physician Assistant

## 2019-05-11 DIAGNOSIS — G894 Chronic pain syndrome: Secondary | ICD-10-CM

## 2019-05-11 DIAGNOSIS — M5137 Other intervertebral disc degeneration, lumbosacral region: Secondary | ICD-10-CM

## 2019-05-11 DIAGNOSIS — M797 Fibromyalgia: Secondary | ICD-10-CM

## 2019-05-11 DIAGNOSIS — M545 Low back pain, unspecified: Secondary | ICD-10-CM

## 2019-05-14 ENCOUNTER — Other Ambulatory Visit: Payer: Self-pay | Admitting: Physician Assistant

## 2019-05-14 ENCOUNTER — Telehealth: Payer: Self-pay

## 2019-05-14 NOTE — Telephone Encounter (Signed)
Aimee Little called and states she has headache and pressure in face for the last 5 days. She is wanting an antibiotic sent to the pharmacy. Denies fever, chills or sweats. Refused to go to the Urgent Care. Refused a virtual appointment for next week.

## 2019-05-14 NOTE — Telephone Encounter (Signed)
I really cannot do that without talking to her and getting a bit more history.

## 2019-05-17 NOTE — Telephone Encounter (Signed)
Called and LM @ 1:26pm for Aniyia to call back and make a virtual appointment with Dr. Darene Lamer.

## 2019-05-24 ENCOUNTER — Other Ambulatory Visit: Payer: Self-pay | Admitting: Physician Assistant

## 2019-05-24 ENCOUNTER — Other Ambulatory Visit: Payer: Self-pay | Admitting: Family Medicine

## 2019-05-24 DIAGNOSIS — I10 Essential (primary) hypertension: Secondary | ICD-10-CM

## 2019-06-22 ENCOUNTER — Other Ambulatory Visit: Payer: Self-pay | Admitting: Physician Assistant

## 2019-06-22 DIAGNOSIS — K2100 Gastro-esophageal reflux disease with esophagitis, without bleeding: Secondary | ICD-10-CM

## 2019-07-05 ENCOUNTER — Ambulatory Visit (INDEPENDENT_AMBULATORY_CARE_PROVIDER_SITE_OTHER): Payer: Medicare Other | Admitting: Family Medicine

## 2019-07-05 ENCOUNTER — Encounter: Payer: Self-pay | Admitting: Family Medicine

## 2019-07-05 ENCOUNTER — Ambulatory Visit (INDEPENDENT_AMBULATORY_CARE_PROVIDER_SITE_OTHER): Payer: Medicare Other

## 2019-07-05 ENCOUNTER — Other Ambulatory Visit: Payer: Self-pay

## 2019-07-05 VITALS — BP 123/81 | HR 110 | Wt 198.0 lb

## 2019-07-05 DIAGNOSIS — M79605 Pain in left leg: Secondary | ICD-10-CM | POA: Diagnosis not present

## 2019-07-05 DIAGNOSIS — I10 Essential (primary) hypertension: Secondary | ICD-10-CM | POA: Diagnosis not present

## 2019-07-05 DIAGNOSIS — E039 Hypothyroidism, unspecified: Secondary | ICD-10-CM | POA: Diagnosis not present

## 2019-07-05 DIAGNOSIS — M797 Fibromyalgia: Secondary | ICD-10-CM | POA: Diagnosis not present

## 2019-07-05 MED ORDER — DICLOFENAC SODIUM 1 % TD GEL: 4.0000 g | Freq: Four times a day (QID) | TRANSDERMAL | 11 refills | Status: DC

## 2019-07-05 NOTE — Patient Instructions (Signed)
Thank you for coming in today. Get labs now.  Use the diclofenac gel.  If not better let me know and next step is MRI.  Your pain is not typical but we need to figure it out.

## 2019-07-05 NOTE — Progress Notes (Signed)
Aimee Little is a 58 y.o. female who presents to [facility]: [facility] today for left shin pain.  Patient has a several week history of pain at the proximal medial lower leg extending down to the midshaft of the tibia.  Pain is worse with prolonged standing and sitting.  She also has pain bothersome interfering with sleep.  She is been taking trazodone to help her fall asleep which does help but does not control her pain.  She notes the pain is not particularly worse with activity.  No injury.  No fevers or chills.  She has tried naproxen tramadol and gabapentin for this which have not helped much.  She denies any injury or significant change in activity.   ROS as above:  Exam:  BP 123/81   Pulse (!) 110   Wt 198 lb (89.8 kg)   BMI 28.41 kg/m  Wt Readings from Last 5 Encounters:  07/05/19 198 lb (89.8 kg)  02/19/19 204 lb (92.5 kg)  02/05/19 204 lb (92.5 kg)  01/14/19 199 lb (90.3 kg)  11/30/18 199 lb (90.3 kg)    Gen: Well NAD HEENT: EOMI,  MMM Lungs: Normal work of breathing. CTABL Heart: RRR no MRG Abd: NABS, Soft. Nondistended, Nontender Exts: Brisk capillary refill, warm and well perfused.  Left lower leg normal-appearing no significant swelling.  Tender palpation medial aspect of tibia from the proximal area down to midshaft.  Normal knee and foot motion.  Normal strength.  Normal gait.  Pulses cap refill and sensation are intact distally.  Lab and Radiology Results X-ray images left tib-fib obtained today personally independently reviewed No fractures or cortical defect.  No calcifications or significant soft tissue changes. Await formal radiology review.    Assessment and Plan: 58 y.o. female with left medial tibial pain.  Unclear etiology.  X-ray today was unremarkable per my view.  Plan for metabolic work-up listed below as well as trial of diclofenac gel.   If not improving may proceed with MRI for further evaluation management.  Ultimately may benefit from nerve conduction study.  PDMP not reviewed this encounter. Orders Placed This Encounter  Procedures  . DG Tibia/Fibula Left    Standing Status:   Future    Number of Occurrences:   1    Standing Expiration Date:   09/03/2020    Order Specific Question:   Reason for Exam (SYMPTOM  OR DIAGNOSIS REQUIRED)    Answer:   eval medial lower leg pain.    Order Specific Question:   Is patient pregnant?    Answer:   No    Order Specific Question:   Preferred imaging location?    Answer:   [facility]    Order Specific Question:   Radiology Contrast Protocol - do NOT remove file path    Answer:   [email or web address]  . CBC  . COMPLETE METABOLIC PANEL WITH GFR  . TSH  . Sedimentation rate  . CK   Meds ordered this encounter  Medications  . diclofenac sodium (VOLTAREN) 1 % GEL    Sig: Apply 4 g topically 4 (four) times daily. To affected joint.    Dispense:  100 g    Refill:  11     Historical information moved to improve visibility of documentation.  Past Medical History:  Diagnosis Date  . DDD (degenerative disc disease), lumbar   . Depression   . Fibromyalgia   . Insomnia   .  Migraine   . Thyroid disease   . Tobacco use    Past Surgical History:  Procedure Laterality Date  . ABDOMINAL HYSTERECTOMY     took uterus and both ovaries left   Social History   Tobacco Use  . Smoking status: Light Tobacco Smoker    Packs/day: 0.25    Last attempt to quit: 10/02/2013    Years since quitting: 5.7  . Smokeless tobacco: Never Used  Substance Use Topics  . Alcohol use: No    Frequency: Never   family history includes Alcohol abuse in her brother; Depression in her mother; Diabetes in her sister; Heart attack in her father; Hyperlipidemia in her mother; Hypertension in her brother, mother, and sister.  Medications: Current  Outpatient Medications  Medication Sig Dispense Refill  . ALPRAZolam (XANAX) 0.5 MG tablet Take 1 tablet (0.5 mg total) by mouth 2 (two) times daily as needed for anxiety. 30 tablet 1  . AMBULATORY NON FORMULARY MEDICATION Tens unit for lumbar spine for low back pain, muscle spasm, lumbar DDD. 1 Device 0  . amLODipine (NORVASC) 10 MG tablet Take 1 tablet (10 mg total) by mouth daily. NEEDS APPOINTMENT 90 tablet 0  . busPIRone (BUSPAR) 15 MG tablet TAKE ONE TABLET BY MOUTH 2 TIMES A DAY 180 tablet 1  . cholecalciferol (VITAMIN D3) 25 MCG (1000 UT) tablet Take 1,000 Units by mouth daily.    . citalopram (CELEXA) 20 MG tablet Take 1 tablet (20 mg total) by mouth daily. 90 tablet 1  . cyclobenzaprine (FLEXERIL) 10 MG tablet Take 1 tablet (10 mg total) by mouth 3 (three) times daily as needed for muscle spasms. 180 tablet 0  . DEXILANT 60 MG capsule Take 1 capsule (60 mg total) by mouth daily. 90 capsule 1  . diclofenac sodium (VOLTAREN) 1 % GEL Apply 4 g topically 4 (four) times daily. To affected joint. 100 g 11  . famotidine (PEPCID) 20 MG tablet Take 1 tablet (20 mg total) by mouth 2 (two) times daily. 60 tablet 11  . fluticasone (FLONASE) 50 MCG/ACT nasal spray USE 1 TO 2 SPRAYS IN EACH NOSTRIL ONCE DAILY. 16 g 6  . gabapentin (NEURONTIN) 300 MG capsule TAKE 1 CAPSULES BY MOUTH 3 TIMES A DAY AS NEEDED FOR PAIN/NEUROPATHY 270 capsule 4  . hydrochlorothiazide (HYDRODIURIL) 25 MG tablet Take 1 tablet (25 mg total) by mouth daily. 30 tablet 2  . HYDROcodone-acetaminophen (NORCO/VICODIN) 5-325 MG tablet Take 1 tablet by mouth every 6 (six) hours as needed. 15 tablet 0  . hydrOXYzine (ATARAX/VISTARIL) 25 MG tablet Take 1 tablet (25 mg total) by mouth 3 (three) times daily as needed for anxiety. 90 tablet 5  . levothyroxine (SYNTHROID) 150 MCG tablet Take 1 tablet (150 mcg total) by mouth daily before breakfast. 30 tablet 0  . meloxicam (MOBIC) 15 MG tablet Take 1 tablet (15 mg total) by mouth daily. 90  tablet 1  . metoCLOPramide (REGLAN) 10 MG tablet Take 1 tablet (10 mg total) by mouth 4 (four) times daily - before meals and at bedtime. 120 tablet 0  . Misc. Devices MISC Initiate Bipap 8/4 cm to 16/12cm water pressure. New mask and supplies through DME [facility].    . Omega 3-6-9 Fatty Acids (OMEGA 3-6-9 COMPLEX PO) Take by mouth.    . rizatriptan (MAXALT-MLT) 10 MG disintegrating tablet TAKE ONE TABLET BY MOUTH AS NEEDED. MAY REPEAT IN 2 HOURS IF NEEDED. 30 tablet 1  . rOPINIRole (REQUIP) 1 MG tablet TAKE ONE TABLET  BY MOUTH 3 TIMES A DAY. 270 tablet 1  . topiramate (TOPAMAX) 100 MG tablet TAKE ONE TABLET BY MOUTH 2 TIMES A DAY 180 tablet 1  . traMADol (ULTRAM) 50 MG tablet TAKE TWO TABLETS BY MOUTH TWICE DAILY AS NEEDED FOR MODERATE TO SEVERE PAIN 120 tablet 0  . traZODone (DESYREL) 100 MG tablet Take one tablet (100 mg dose) by mouth at bedtime as needed for Sleep. 90 tablet 1  . valACYclovir (VALTREX) 1000 MG tablet TAKE TWO TABLETS 2 TIMES A DAY AT ONSET OF COLD SORE FOR 2 DAYS 60 tablet 0  . VITAMIN E PO Take by mouth.     No current facility-administered medications for this visit.    Allergies  Allergen Reactions  . Doxepin Other (See Comments)  . Pristiq [Desvenlafaxine Succinate Er]     Depression worse.  . Estradiol Other (See Comments)    Burning   . Gabapentin     Sleep walking  . Levothyroxine     Other reaction(s): Other (See Comments) Reaction unknown  . Lexapro [Escitalopram Oxalate]     jittery  . Lisinopril     cough  . Losartan     itching  . Mirapex [Pramipexole Dihydrochloride]     Nausea and vomiting  . Phentermine Diarrhea    Stomach ache/increased anxiety.   . Pregabalin   . Pristiq [Desvenlafaxine] Other (See Comments)    Increased depression  . Seroquel [Quetiapine Fumarate]     hallicinations  . Trintellix [Vortioxetine]     RLS/increased anxiety     Discussed warning signs or symptoms. Please see discharge instructions. Patient expresses  understanding.

## 2019-07-06 LAB — COMPLETE METABOLIC PANEL WITH GFR
AG Ratio: 1.8 (calc) (ref 1.0–2.5)
ALT: 20 U/L (ref 6–29)
AST: 17 U/L (ref 10–35)
Albumin: 4.2 g/dL (ref 3.6–5.1)
Alkaline phosphatase (APISO): 66 U/L (ref 37–153)
BUN: 16 mg/dL (ref 7–25)
CO2: 28 mmol/L (ref 20–32)
Calcium: 9.7 mg/dL (ref 8.6–10.4)
Chloride: 107 mmol/L (ref 98–110)
Creat: 0.84 mg/dL (ref 0.50–1.05)
GFR, Est African American: 88 mL/min/{1.73_m2} (ref >=60)
GFR, Est Non African American: 76 mL/min/{1.73_m2} (ref >=60)
Globulin: 2.4 g/dL (calc) (ref 1.9–3.7)
Glucose, Bld: 101 mg/dL — ABNORMAL HIGH (ref 65–99)
Potassium: 3.5 mmol/L (ref 3.5–5.3)
Sodium: 142 mmol/L (ref 135–146)
Total Bilirubin: 0.4 mg/dL (ref 0.2–1.2)
Total Protein: 6.6 g/dL (ref 6.1–8.1)

## 2019-07-06 LAB — TSH: TSH: 0.25 mIU/L — ABNORMAL LOW (ref 0.40–4.50)

## 2019-07-06 LAB — CBC
HCT: 48.7 % — ABNORMAL HIGH (ref 35.0–45.0)
Hemoglobin: 16.7 g/dL — ABNORMAL HIGH (ref 11.7–15.5)
MCH: 33.5 pg — ABNORMAL HIGH (ref 27.0–33.0)
MCHC: 34.3 g/dL (ref 32.0–36.0)
MCV: 97.6 fL (ref 80.0–100.0)
MPV: 11.5 fL (ref 7.5–12.5)
Platelets: 295 10*3/uL (ref 140–400)
RBC: 4.99 10*6/uL (ref 3.80–5.10)
RDW: 12.7 % (ref 11.0–15.0)
WBC: 11.5 10*3/uL — ABNORMAL HIGH (ref 3.8–10.8)

## 2019-07-06 LAB — CK: Total CK: 81 U/L (ref 29–143)

## 2019-07-06 LAB — SEDIMENTATION RATE: Sed Rate: 6 mm/h (ref 0–30)

## 2019-07-06 MED ORDER — LEVOTHYROXINE SODIUM 125 MCG PO TABS: 125.0000 ug | ORAL_TABLET | Freq: Every day | ORAL | 1 refills | Status: DC

## 2019-07-06 NOTE — Addendum Note (Signed)
Addended by: Gregor Hams on: 07/06/2019 07:09 AM   Modules accepted: Orders

## 2019-07-07 ENCOUNTER — Telehealth: Payer: Self-pay | Admitting: Family Medicine

## 2019-07-07 DIAGNOSIS — R200 Anesthesia of skin: Secondary | ICD-10-CM

## 2019-07-07 DIAGNOSIS — M79605 Pain in left leg: Secondary | ICD-10-CM

## 2019-07-07 DIAGNOSIS — R202 Paresthesia of skin: Secondary | ICD-10-CM

## 2019-07-07 NOTE — Telephone Encounter (Signed)
-----   Message from Delrae Alfred, [place] sent at 07/06/2019 10:08 AM EDT ----- Pt notified of results. She does not want to change doses.  She said that its going to cause her to gain weight.  Also she wants to know how is it possibly causing her to have pain in one leg. Please advise. -EH/RMA

## 2019-07-07 NOTE — Telephone Encounter (Signed)
Will route to PCP for lab result review

## 2019-07-07 NOTE — Telephone Encounter (Signed)
Thyroid hormone being too high or too low can cause leg swelling and sometimes muscle pain.  Recommend you discuss this issue further with Iran Planas PA your primary care provider. I am happy to help in any way I can further. However my recommendation of changing thyroid dose does stand.

## 2019-07-08 NOTE — Telephone Encounter (Signed)
We have normal ranges of thyroid and other things because outside of that there can be side effects and symptoms. I cannot guarantee that adjusting your thyroid dose will resolve leg pain. I do think getting into range could help with other things and just make you feel a little better. I would encourage decreasing to the 170mcg dose.

## 2019-07-08 NOTE — Telephone Encounter (Signed)
Pt advised. She did start the 118mcg dose. The burning in her leg is getting worse. Its affecting her ability to complete ADLs. She is having to get her friend to cook for her, etc. Routing to see if there are other options for her.

## 2019-07-09 NOTE — Telephone Encounter (Signed)
Dr. Georgina Snell,   Note read next step is MRI. Do you think MRI of fibula/tibia or MRI of lumbar spine?   Pts pain not getting any better and worse with numbness and tingling?

## 2019-07-12 NOTE — Telephone Encounter (Signed)
Via Dr. Georgina Snell note. Ordered MRI tib/fib.  Pt did start new thyroid medication. Will recheck in 4-6 weeks.

## 2019-07-12 NOTE — Telephone Encounter (Signed)
MRI tib-fib is reasonable next step.  Additionally we should consider adjusting thyroid dose or at least rechecking TSH in near future.

## 2019-07-12 NOTE — Telephone Encounter (Signed)
Pt advised and scheduled for MRI. She will repeat labs when time

## 2019-07-15 ENCOUNTER — Other Ambulatory Visit: Payer: Self-pay | Admitting: Physician Assistant

## 2019-07-15 DIAGNOSIS — G43909 Migraine, unspecified, not intractable, without status migrainosus: Secondary | ICD-10-CM

## 2019-07-20 ENCOUNTER — Other Ambulatory Visit: Payer: Medicare Other

## 2019-07-26 ENCOUNTER — Ambulatory Visit (INDEPENDENT_AMBULATORY_CARE_PROVIDER_SITE_OTHER): Payer: Medicare Other | Admitting: Physician Assistant

## 2019-07-26 ENCOUNTER — Encounter: Payer: Self-pay | Admitting: Physician Assistant

## 2019-07-26 VITALS — Temp 98.7°F | Ht 70.0 in | Wt 204.0 lb

## 2019-07-26 DIAGNOSIS — J0140 Acute pansinusitis, unspecified: Secondary | ICD-10-CM

## 2019-07-26 DIAGNOSIS — F332 Major depressive disorder, recurrent severe without psychotic features: Secondary | ICD-10-CM

## 2019-07-26 MED ORDER — CITALOPRAM HYDROBROMIDE 40 MG PO TABS: 40.0000 mg | ORAL_TABLET | Freq: Every day | ORAL | 1 refills | Status: DC

## 2019-07-26 MED ORDER — PREDNISONE 50 MG PO TABS: ORAL_TABLET | ORAL | 0 refills | Status: DC

## 2019-07-26 MED ORDER — AZITHROMYCIN 250 MG PO TABS
ORAL_TABLET | ORAL | 0 refills | Status: DC
Start: 2019-07-26 — End: 2019-08-03

## 2019-07-26 NOTE — Progress Notes (Signed)
Patient ID: Aimee Little, female   DOB: 12-Mar-1961, 58 y.o.   MRN: [id number] .Marland KitchenVirtual Visit via Telephone Note  I connected with Aimee Little on 07/26/19 at 11:30 AM EDT by telephone and verified that I am speaking with the correct person using two identifiers.  Location: Patient: home Provider: clinic   I discussed the limitations, risks, security and privacy concerns of performing an evaluation and management service by telephone and the availability of in person appointments. I also discussed with the patient that there may be a patient responsible charge related to this service. The patient expressed understanding and agreed to proceed.   History of Present Illness: Pt is a 58 yo female who calls into the clinic with nasal congestion, headaches, sore throat, weakness, sinus pressure for the last 8 days. She thought she was getting better but then got worse. No cough, fever, body aches, loss of smell or taste, GI symptoms. She has tried flonase, nyquil, tylenol cold and sinus and migraine medications. Pt reports a lot face pain.   She has been taking 40mg  of celexa but having to take 2 of her 20mg  and request increase. It is helping with her mood during this season.   .. Active Ambulatory Problems    Diagnosis Date Noted  . HOT FLASHES 12/29/2009  . [place] DISEASE, LUMBAR 12/29/2009  . Fibromyalgia 01/01/2013  . Unspecified vitamin D deficiency 05/16/2013  . Migraine without status migrainosus, not intractable 07/21/2013  . Chronic pain syndrome 11/05/2013  . Anxiety and depression 12/17/2013  . Adenomatous colon polyp 12/20/2013  . Obesity 11/25/2014  . Other fatigue 05/26/2015  . Bilateral low back pain without sciatica 09/06/2015  . RLS (restless legs syndrome) 09/22/2015  . Thyroid activity decreased 10/27/2015  . Insomnia 11/21/2015  . Severe episode of recurrent major depressive disorder, without psychotic features ([place]) 11/29/2015  . Rhinitis, allergic 11/29/2015  .  Vaginal atrophy 01/02/2016  . Left knee pain 01/23/2016  . Panic attacks 03/19/2016  . Loose body in knee 04/17/2016  . Chondromalacia of patellofemoral joint 04/17/2016  . Diverticulitis of colon without hemorrhage 07/19/2016  . Nonintractable headache 03/06/2017  . Tachycardia 03/06/2017  . Rib pain on left side 08/03/2017  . Diverticulitis 09/10/2017  . Elevated blood pressure reading 10/01/2017  . Light tobacco smoker 10/01/2017  . Hypertension goal BP (blood pressure) < 130/80 10/01/2017  . Rib pain on right side 11/04/2017  . Traumatic hematoma of eyelid, right, initial encounter 11/04/2017  . DDD (degenerative disc disease), lumbar 04/01/2018  . Acute bilateral low back pain with bilateral sciatica 04/01/2018  . OSA (obstructive sleep apnea) 05/29/2018  . Chronic bilateral thoracic back pain 08/04/2018  . Closed fracture of one rib of right side 12/01/2018  . Osteopenia 03/24/2019   Resolved Ambulatory Problems    Diagnosis Date Noted  . Unspecified hypothyroidism 12/29/2009  . TOBACCO ABUSE 06/13/2010  . GRIEF REACTION, ACUTE 12/29/2009  . Depression 02/06/2010  . Osteoarthrosis involving lower leg 12/29/2009  . NAUSEA AND VOMITING 05/02/2010  . Osteoarthritis of left knee 07/21/2013  . Medication management 11/05/2013  . Nausea with vomiting 04/07/2014  . Lumbago 11/25/2014  . Sinusitis 04/13/2015  . Sacroiliac joint dysfunction of both sides 09/06/2015  . Right knee pain 11/07/2015  . GAD (generalized anxiety disorder) 03/19/2016  . Abdominal pain, left lower quadrant 04/25/2016  . Trochanteric bursitis of left hip 01/29/2017  . Low grade fever 04/01/2018   Past Medical History:  Diagnosis Date  . Migraine   .  Thyroid disease   . Tobacco use       Observations/Objective: No acute distress. Congested sounding.  No cough or labored breathing.   .. Today's Vitals   07/26/19 1106  Temp: 98.7 F (37.1 C)  TempSrc: Oral  Weight: 204 lb (92.5 kg)   Height: 5\' 10"  (1.778 m)   Body mass index is 29.27 kg/m.    Assessment and Plan: Marland KitchenMarland KitchenMelissa was seen today for nasal congestion, sore throat and headache.  Diagnoses and all orders for this visit:  Acute non-recurrent pansinusitis -     azithromycin (ZITHROMAX) 250 MG tablet; Take 2 tablets now and then take one tablet for 4 days. -     predniSONE (DELTASONE) 50 MG tablet; Take one tablet for 5 days.  Severe episode of recurrent major depressive disorder, without psychotic features ([place]) -     citalopram (CELEXA) 40 MG tablet; Take 1 tablet (40 mg total) by mouth daily.  due to symptoms duration and worsening of symptoms after improvement suspect sinusitis due to secondary sickening. Treated with prednisone and zpack. Discussed can not rule out covid. Should be tested and self isolate until test results come back.   Refilled celexa with increase.     Follow Up Instructions:    I discussed the assessment and treatment plan with the patient. The patient was provided an opportunity to ask questions and all were answered. The patient agreed with the plan and demonstrated an understanding of the instructions.   The patient was advised to call back or seek an in-person evaluation if the symptoms worsen or if the condition fails to improve as anticipated.  I provided 10 minutes of non-face-to-face time during this encounter.   Iran Planas, PA-C

## 2019-07-26 NOTE — Progress Notes (Deleted)
Started 8 days ago: Stuffy nose HA Scratchy throat Weakness Drainage - cough  No loss of smell/taste, no body aches  Has tried flonase, nyquil, tylenol sinus, migraine meds

## 2019-07-30 ENCOUNTER — Other Ambulatory Visit: Payer: Self-pay | Admitting: Physician Assistant

## 2019-07-30 DIAGNOSIS — F329 Major depressive disorder, single episode, unspecified: Secondary | ICD-10-CM

## 2019-07-30 DIAGNOSIS — F5101 Primary insomnia: Secondary | ICD-10-CM

## 2019-07-30 DIAGNOSIS — F32A Depression, unspecified: Secondary | ICD-10-CM

## 2019-07-30 DIAGNOSIS — K2100 Gastro-esophageal reflux disease with esophagitis, without bleeding: Secondary | ICD-10-CM

## 2019-07-30 DIAGNOSIS — G2581 Restless legs syndrome: Secondary | ICD-10-CM

## 2019-07-30 DIAGNOSIS — F332 Major depressive disorder, recurrent severe without psychotic features: Secondary | ICD-10-CM

## 2019-08-02 ENCOUNTER — Telehealth: Payer: Self-pay | Admitting: Physician Assistant

## 2019-08-02 DIAGNOSIS — F32A Depression, unspecified: Secondary | ICD-10-CM

## 2019-08-02 DIAGNOSIS — F329 Major depressive disorder, single episode, unspecified: Secondary | ICD-10-CM

## 2019-08-02 DIAGNOSIS — F419 Anxiety disorder, unspecified: Secondary | ICD-10-CM

## 2019-08-02 MED ORDER — ALPRAZOLAM 0.5 MG PO TABS: 0.5000 mg | ORAL_TABLET | Freq: Two times a day (BID) | ORAL | 1 refills | Status: DC | PRN

## 2019-08-02 NOTE — Telephone Encounter (Signed)
Patient advised.

## 2019-08-02 NOTE — Telephone Encounter (Signed)
Make sure she is using her [place].

## 2019-08-02 NOTE — Telephone Encounter (Signed)
Phone rang multiple times, no answer, no voicemail.

## 2019-08-02 NOTE — Telephone Encounter (Signed)
Patient states that she think she's having a lot of panic attacks. Did offer appointment for today as a virtual. States that when she lays down she feels like her heart is coming out of her chest. Patient declined a virtual today or to speak to a nurse. Made an appointment for tomorrow. States that there was stuff going on outside and that's why she felt anxious. Just FYI.

## 2019-08-02 NOTE — Addendum Note (Signed)
Addended by: Donella Stade on: 08/02/2019 02:47 PM   Modules accepted: Orders

## 2019-08-02 NOTE — Addendum Note (Signed)
Addended by: Narda Rutherford on: 08/02/2019 02:44 PM   Modules accepted: Orders

## 2019-08-02 NOTE — Telephone Encounter (Signed)
Ok sent refill.

## 2019-08-02 NOTE — Telephone Encounter (Signed)
Aimee Little took her last Xanax last night. Please refill.

## 2019-08-03 ENCOUNTER — Ambulatory Visit (INDEPENDENT_AMBULATORY_CARE_PROVIDER_SITE_OTHER): Payer: Medicare Other | Admitting: Physician Assistant

## 2019-08-03 VITALS — Wt 204.0 lb

## 2019-08-03 DIAGNOSIS — M62838 Other muscle spasm: Secondary | ICD-10-CM

## 2019-08-03 DIAGNOSIS — M546 Pain in thoracic spine: Secondary | ICD-10-CM | POA: Diagnosis not present

## 2019-08-03 DIAGNOSIS — G894 Chronic pain syndrome: Secondary | ICD-10-CM

## 2019-08-03 DIAGNOSIS — M549 Dorsalgia, unspecified: Secondary | ICD-10-CM | POA: Diagnosis not present

## 2019-08-03 DIAGNOSIS — M5137 Other intervertebral disc degeneration, lumbosacral region: Secondary | ICD-10-CM

## 2019-08-03 DIAGNOSIS — G8929 Other chronic pain: Secondary | ICD-10-CM

## 2019-08-03 DIAGNOSIS — F332 Major depressive disorder, recurrent severe without psychotic features: Secondary | ICD-10-CM

## 2019-08-03 DIAGNOSIS — M797 Fibromyalgia: Secondary | ICD-10-CM

## 2019-08-03 MED ORDER — HYDROCODONE-ACETAMINOPHEN 5-325 MG PO TABS: ORAL_TABLET | ORAL | 0 refills | Status: DC

## 2019-08-03 NOTE — Progress Notes (Signed)
Patient ID: Aimee Little, female   DOB: [birth year], 60 y.o.   MRN: [id number] .Marland KitchenVirtual Visit via Telephone Note  I connected with Aimee Little on 08/06/19 at  1:00 PM EST by telephone and verified that I am speaking with the correct person using two identifiers.  Location: Patient: home Provider: clinic   I discussed the limitations, risks, security and privacy concerns of performing an evaluation and management service by telephone and the availability of in person appointments. I also discussed with the patient that there may be a patient responsible charge related to this service. The patient expressed understanding and agreed to proceed.   History of Present Illness: Pt is a 58 yo female with chronic upper and lower back pain, lumbar DDD, fibromyaglia, MDD, Anxiety who calls into the clinic for follow up. She expresses that her mood is finally better and she has the motivation to get up and do things but now her pain keeps her from going and doing things. She has been rather inactive since feb 2020. She is now starting to cook and do things but her pain keeps her from being active. She is requesting a norco as needed for this. She is taking mobic, gabapentin, muscle relaxer with some benefit. She denies any radiation of pain into extermities, numbness or tingling, weakness. She has tramadol but not helping at all.   .. Active Ambulatory Problems    Diagnosis Date Noted  . HOT FLASHES 12/29/2009  . [place] DISEASE, LUMBAR 12/29/2009  . Fibromyalgia 01/01/2013  . Unspecified vitamin D deficiency 05/16/2013  . Migraine without status migrainosus, not intractable 07/21/2013  . Chronic pain syndrome 11/05/2013  . Anxiety and depression 12/17/2013  . Adenomatous colon polyp 12/20/2013  . Obesity 11/25/2014  . Other fatigue 05/26/2015  . Bilateral low back pain without sciatica 09/06/2015  . RLS (restless legs syndrome) 09/22/2015  . Thyroid activity decreased 10/27/2015  . Insomnia  11/21/2015  . Severe episode of recurrent major depressive disorder, without psychotic features ([place]) 11/29/2015  . Rhinitis, allergic 11/29/2015  . Vaginal atrophy 01/02/2016  . Left knee pain 01/23/2016  . Panic attacks 03/19/2016  . Loose body in knee 04/17/2016  . Chondromalacia of patellofemoral joint 04/17/2016  . Diverticulitis of colon without hemorrhage 07/19/2016  . Nonintractable headache 03/06/2017  . Tachycardia 03/06/2017  . Rib pain on left side 08/03/2017  . Diverticulitis 09/10/2017  . Elevated blood pressure reading 10/01/2017  . Light tobacco smoker 10/01/2017  . Hypertension goal BP (blood pressure) < 130/80 10/01/2017  . Rib pain on right side 11/04/2017  . Traumatic hematoma of eyelid, right, initial encounter 11/04/2017  . DDD (degenerative disc disease), lumbar 04/01/2018  . Acute bilateral low back pain with bilateral sciatica 04/01/2018  . OSA (obstructive sleep apnea) 05/29/2018  . Chronic bilateral thoracic back pain 08/04/2018  . Closed fracture of one rib of right side 12/01/2018  . Osteopenia 03/24/2019   Resolved Ambulatory Problems    Diagnosis Date Noted  . Unspecified hypothyroidism 12/29/2009  . TOBACCO ABUSE 06/13/2010  . GRIEF REACTION, ACUTE 12/29/2009  . Depression 02/06/2010  . Osteoarthrosis involving lower leg 12/29/2009  . NAUSEA AND VOMITING 05/02/2010  . Osteoarthritis of left knee 07/21/2013  . Medication management 11/05/2013  . Nausea with vomiting 04/07/2014  . Lumbago 11/25/2014  . Sinusitis 04/13/2015  . Sacroiliac joint dysfunction of both sides 09/06/2015  . Right knee pain 11/07/2015  . GAD (generalized anxiety disorder) 03/19/2016  . Abdominal pain, left lower quadrant 04/25/2016  .  Trochanteric bursitis of left hip 01/29/2017  . Low grade fever 04/01/2018   Past Medical History:  Diagnosis Date  . Migraine   . Thyroid disease   . Tobacco use    Reviewed med, allergy, problem list.     Observations/Objective: NO acute distress. No cough or labored breathing.   .. Today's Vitals   08/03/19 1147  Weight: 204 lb (92.5 kg)   Body mass index is 29.27 kg/m.    Assessment and Plan: Marland KitchenMarland KitchenDiagnoses and all orders for this visit:  Upper back pain -     DG Thoracic Spine W/Swimmers -     DG Cervical Spine Complete  Muscle spasms of both lower extremities -     DG Thoracic Spine W/Swimmers -     DG Cervical Spine Complete  DISC DISEASE, LUMBAR -     HYDROcodone-acetaminophen (NORCO/VICODIN) 5-325 MG tablet; Take no more than once a day for moderate to severe pain.  Chronic bilateral thoracic back pain -     HYDROcodone-acetaminophen (NORCO/VICODIN) 5-325 MG tablet; Take no more than once a day for moderate to severe pain.  Chronic pain syndrome -     HYDROcodone-acetaminophen (NORCO/VICODIN) 5-325 MG tablet; Take no more than once a day for moderate to severe pain.  Fibromyalgia -     HYDROcodone-acetaminophen (NORCO/VICODIN) 5-325 MG tablet; Take no more than once a day for moderate to severe pain.  Severe episode of recurrent major depressive disorder, without psychotic features ([place]) -     HYDROcodone-acetaminophen (NORCO/VICODIN) 5-325 MG tablet; Take no more than once a day for moderate to severe pain.   Marland Kitchen.PDMP reviewed during this encounter. Pt is aware NOT to take xanax and/or Tramadol together. We have pain contract for Tramadol not Norco. 30 tablets given to take NO more than once a day. Continue on mobic, gabapentin, muscles relaxers. Use tens units, icy hot patches. Get new thoracic and cervical spine xrays.   Follow up in 3 months.    Follow Up Instructions:    I discussed the assessment and treatment plan with the patient. The patient was provided an opportunity to ask questions and all were answered. The patient agreed with the plan and demonstrated an understanding of the instructions.   The patient was advised to call back or seek an in-person  evaluation if the symptoms worsen or if the condition fails to improve as anticipated.  I provided 15 minutes of non-face-to-face time during this encounter.   Iran Planas, PA-C

## 2019-08-03 NOTE — Progress Notes (Signed)
Patient having a hard time doing things do to not being able to physically do them.  Having lots of worry/anxiety. PHQ9-GAD7 completed.

## 2019-08-04 ENCOUNTER — Other Ambulatory Visit: Payer: Self-pay | Admitting: Physician Assistant

## 2019-08-04 DIAGNOSIS — M797 Fibromyalgia: Secondary | ICD-10-CM

## 2019-08-04 DIAGNOSIS — M5137 Other intervertebral disc degeneration, lumbosacral region: Secondary | ICD-10-CM

## 2019-08-04 DIAGNOSIS — M545 Low back pain, unspecified: Secondary | ICD-10-CM

## 2019-08-04 DIAGNOSIS — G894 Chronic pain syndrome: Secondary | ICD-10-CM

## 2019-08-04 NOTE — Telephone Encounter (Signed)
Seen yesterday.  Last prescribed 01/2018. Given pain medication yesterday, please advise.

## 2019-08-06 ENCOUNTER — Encounter: Payer: Self-pay | Admitting: Physician Assistant

## 2019-08-18 ENCOUNTER — Other Ambulatory Visit: Payer: Self-pay | Admitting: Physician Assistant

## 2019-08-18 DIAGNOSIS — I10 Essential (primary) hypertension: Secondary | ICD-10-CM

## 2019-10-25 ENCOUNTER — Other Ambulatory Visit: Payer: Self-pay | Admitting: Family Medicine

## 2019-10-27 ENCOUNTER — Other Ambulatory Visit: Payer: Self-pay | Admitting: Physician Assistant

## 2019-10-27 DIAGNOSIS — F329 Major depressive disorder, single episode, unspecified: Secondary | ICD-10-CM

## 2019-10-27 DIAGNOSIS — F419 Anxiety disorder, unspecified: Secondary | ICD-10-CM

## 2019-10-27 DIAGNOSIS — F32A Depression, unspecified: Secondary | ICD-10-CM

## 2019-10-27 NOTE — Telephone Encounter (Signed)
Last written 08/02/2019 #30 with one refill.  Last appt 08/03/2019.

## 2019-10-29 ENCOUNTER — Other Ambulatory Visit: Payer: Self-pay | Admitting: Family Medicine

## 2019-10-29 NOTE — Telephone Encounter (Signed)
[facility] requesting med refill.

## 2019-11-01 ENCOUNTER — Other Ambulatory Visit: Payer: Self-pay | Admitting: Family Medicine

## 2019-11-02 ENCOUNTER — Ambulatory Visit (INDEPENDENT_AMBULATORY_CARE_PROVIDER_SITE_OTHER): Payer: Medicare Other | Admitting: Physician Assistant

## 2019-11-02 ENCOUNTER — Encounter: Payer: Self-pay | Admitting: Physician Assistant

## 2019-11-02 VITALS — Temp 98.6°F | Ht 70.0 in | Wt 206.0 lb

## 2019-11-02 DIAGNOSIS — G894 Chronic pain syndrome: Secondary | ICD-10-CM

## 2019-11-02 DIAGNOSIS — G43909 Migraine, unspecified, not intractable, without status migrainosus: Secondary | ICD-10-CM

## 2019-11-02 DIAGNOSIS — F419 Anxiety disorder, unspecified: Secondary | ICD-10-CM | POA: Diagnosis not present

## 2019-11-02 DIAGNOSIS — I10 Essential (primary) hypertension: Secondary | ICD-10-CM

## 2019-11-02 DIAGNOSIS — R5382 Chronic fatigue, unspecified: Secondary | ICD-10-CM

## 2019-11-02 DIAGNOSIS — F332 Major depressive disorder, recurrent severe without psychotic features: Secondary | ICD-10-CM | POA: Diagnosis not present

## 2019-11-02 DIAGNOSIS — F32A Depression, unspecified: Secondary | ICD-10-CM

## 2019-11-02 DIAGNOSIS — M545 Low back pain, unspecified: Secondary | ICD-10-CM

## 2019-11-02 DIAGNOSIS — Z9989 Dependence on other enabling machines and devices: Secondary | ICD-10-CM

## 2019-11-02 DIAGNOSIS — F5101 Primary insomnia: Secondary | ICD-10-CM

## 2019-11-02 DIAGNOSIS — G4733 Obstructive sleep apnea (adult) (pediatric): Secondary | ICD-10-CM | POA: Diagnosis not present

## 2019-11-02 DIAGNOSIS — F329 Major depressive disorder, single episode, unspecified: Secondary | ICD-10-CM

## 2019-11-02 DIAGNOSIS — E039 Hypothyroidism, unspecified: Secondary | ICD-10-CM

## 2019-11-02 MED ORDER — CITALOPRAM HYDROBROMIDE 40 MG PO TABS: 40.0000 mg | ORAL_TABLET | Freq: Every day | ORAL | 1 refills | Status: DC

## 2019-11-02 MED ORDER — AMLODIPINE BESYLATE 10 MG PO TABS: 10.0000 mg | ORAL_TABLET | Freq: Every day | ORAL | 0 refills | Status: DC

## 2019-11-02 MED ORDER — TRAZODONE HCL 100 MG PO TABS: ORAL_TABLET | ORAL | 1 refills | Status: DC

## 2019-11-02 MED ORDER — TOPIRAMATE 100 MG PO TABS: ORAL_TABLET | ORAL | 1 refills | Status: DC

## 2019-11-02 MED ORDER — BUSPIRONE HCL 15 MG PO TABS: ORAL_TABLET | ORAL | 1 refills | Status: DC

## 2019-11-02 MED ORDER — LEVOTHYROXINE SODIUM 125 MCG PO TABS: 125.0000 ug | ORAL_TABLET | Freq: Every day | ORAL | 1 refills | Status: DC

## 2019-11-02 MED ORDER — HYDROCHLOROTHIAZIDE 25 MG PO TABS: 25.0000 mg | ORAL_TABLET | Freq: Every day | ORAL | 1 refills | Status: DC

## 2019-11-02 NOTE — Progress Notes (Addendum)
Patient ID: Aimee Little, female   DOB: 03-Mar-1961, 59 y.o.   MRN: [id number] .Marland KitchenVirtual Visit via Video Note  I connected with Aimee Little on 11/02/19 at  2:00 PM EST by a video enabled telemedicine application and verified that I am speaking with the correct person using two identifiers.  Location: Patient: home Provider: clinic   I discussed the limitations of evaluation and management by telemedicine and the availability of in person appointments. The patient expressed understanding and agreed to proceed.  History of Present Illness: Pt is a 59 yo female with chronic pain, chronic fatigue, MDD, OSA, anxiety, hypothyroidism, HTN who calls into the clinic for refills.   She is doing ok. She wishes not to come to lab due to covid. She does need refills. She broke her home cuff and not able to check BP. No CP, palpitations, headaches, vision changes.   She is very tired and continues to have low back pain. She has to walk/stand for short periods of time. She is not using CPAP feels like it doesn't work like it should.   She does fell better on lower dose of levothryoxine. Leg pain went away, less nervous, sweats less.   Her mood is controlled. No concerns. No excessive crying or worrying.   .. Active Ambulatory Problems    Diagnosis Date Noted  . HOT FLASHES 12/29/2009  . [place] DISEASE, LUMBAR 12/29/2009  . Fibromyalgia 01/01/2013  . Unspecified vitamin D deficiency 05/16/2013  . Migraine without status migrainosus, not intractable 07/21/2013  . Chronic pain syndrome 11/05/2013  . Anxiety and depression 12/17/2013  . Adenomatous colon polyp 12/20/2013  . Obesity 11/25/2014  . Chronic fatigue 05/26/2015  . Bilateral low back pain without sciatica 09/06/2015  . RLS (restless legs syndrome) 09/22/2015  . Thyroid activity decreased 10/27/2015  . Insomnia 11/21/2015  . Severe episode of recurrent major depressive disorder, without psychotic features ([place]) 11/29/2015  . Rhinitis,  allergic 11/29/2015  . Vaginal atrophy 01/02/2016  . Left knee pain 01/23/2016  . Panic attacks 03/19/2016  . Loose body in knee 04/17/2016  . Chondromalacia of patellofemoral joint 04/17/2016  . Diverticulitis of colon without hemorrhage 07/19/2016  . Nonintractable headache 03/06/2017  . Tachycardia 03/06/2017  . Rib pain on left side 08/03/2017  . Diverticulitis 09/10/2017  . Elevated blood pressure reading 10/01/2017  . Light tobacco smoker 10/01/2017  . Hypertension goal BP (blood pressure) < 130/80 10/01/2017  . Rib pain on right side 11/04/2017  . Traumatic hematoma of eyelid, right, initial encounter 11/04/2017  . DDD (degenerative disc disease), lumbar 04/01/2018  . Acute bilateral low back pain with bilateral sciatica 04/01/2018  . OSA on CPAP 05/29/2018  . Chronic bilateral thoracic back pain 08/04/2018  . Closed fracture of one rib of right side 12/01/2018  . Osteopenia 03/24/2019   Resolved Ambulatory Problems    Diagnosis Date Noted  . Unspecified hypothyroidism 12/29/2009  . TOBACCO ABUSE 06/13/2010  . GRIEF REACTION, ACUTE 12/29/2009  . Depression 02/06/2010  . Osteoarthrosis involving lower leg 12/29/2009  . NAUSEA AND VOMITING 05/02/2010  . Osteoarthritis of left knee 07/21/2013  . Medication management 11/05/2013  . Nausea with vomiting 04/07/2014  . Lumbago 11/25/2014  . Sinusitis 04/13/2015  . Sacroiliac joint dysfunction of both sides 09/06/2015  . Right knee pain 11/07/2015  . GAD (generalized anxiety disorder) 03/19/2016  . Abdominal pain, left lower quadrant 04/25/2016  . Trochanteric bursitis of left hip 01/29/2017  . Low grade fever 04/01/2018   Past  Medical History:  Diagnosis Date  . Migraine   . Thyroid disease   . Tobacco use    Reviewed med, allergy, problem list.     Observations/Objective: No acute distress. Normal mood and appearance.   .. Today's Vitals   11/02/19 1153  Temp: 98.6 F (37 C)  TempSrc: Oral  Weight: 206  lb (93.4 kg)  Height: 5\' 10"  (1.778 m)   Body mass index is 29.56 kg/m.  .. Depression screen [facility] 2/9 11/02/2019 08/03/2019 02/05/2019 09/16/2017 09/10/2017  Decreased Interest 3 0 1 3 2   Down, Depressed, Hopeless 0 0 1 3 2   PHQ - 2 Score 3 0 2 6 4   Altered sleeping 0 - 1 3 3   Tired, decreased energy 3 - 3 3 3   Change in appetite 0 - 1 3 3   Feeling bad or failure about yourself  0 - 0 2 3  Trouble concentrating 0 - 1 2 1   Moving slowly or fidgety/restless 0 - 1 2 2   Suicidal thoughts 0 - 0 0 1  PHQ-9 Score 6 - 9 21 20   Difficult doing work/chores Somewhat difficult - Not difficult at all Very difficult -   .. GAD 7 : Generalized Anxiety Score 11/02/2019 08/03/2019 02/05/2019 09/16/2017  Nervous, Anxious, on Edge 1 2 2 3   Control/stop worrying 0 2 1 2   Worry too much - different things 0 0 1 2  Trouble relaxing 0 2 1 3   Restless 1 2 1 1   Easily annoyed or irritable 0 0 0 1  Afraid - awful might happen 0 0 2 3  Total GAD 7 Score 2 8 8 15   Anxiety Difficulty Somewhat difficult Not difficult at all Not difficult at all -       Assessment and Plan: Marland KitchenMarland KitchenMelissa was seen today for hypothyroidism.  Diagnoses and all orders for this visit:  OSA on CPAP  Hypertension goal BP (blood pressure) < 130/80 -     amLODipine (NORVASC) 10 MG tablet; Take 1 tablet (10 mg total) by mouth daily. -     hydrochlorothiazide (HYDRODIURIL) 25 MG tablet; Take 1 tablet (25 mg total) by mouth daily.  Anxiety and depression -     busPIRone (BUSPAR) 15 MG tablet; TAKE ONE TABLET BY MOUTH 2 TIMES A DAY  Severe episode of recurrent major depressive disorder, without psychotic features ([place]) -     citalopram (CELEXA) 40 MG tablet; Take 1 tablet (40 mg total) by mouth daily.  Hypothyroidism, unspecified type -     TSH -     levothyroxine (SYNTHROID) 125 MCG tablet; Take 1 tablet (125 mcg total) by mouth daily.  Chronic pain syndrome  Acute bilateral low back pain without sciatica  Chronic  fatigue  Migraine without status migrainosus, not intractable, unspecified migraine type -     topiramate (TOPAMAX) 100 MG tablet; Take one tablet twice a day.  Primary insomnia -     traZODone (DESYREL) 100 MG tablet; Take one tablet before bed.  reviewed last labs and chart.  Medications refilled.  Pt would like to wait until she is vaccinated before labs.   Discussed using CPAP for fatigue. Encouraged regular walking. Mood seems to be good.   Follow up in 6 months.   Spent 28 minutes with patient and with chart review.    Follow Up Instructions:    I discussed the assessment and treatment plan with the patient. The patient was provided an opportunity to ask questions and all were answered. The  patient agreed with the plan and demonstrated an understanding of the instructions.   The patient was advised to call back or seek an in-person evaluation if the symptoms worsen or if the condition fails to improve as anticipated.    Iran Planas, PA-C

## 2019-11-02 NOTE — Progress Notes (Signed)
Needs refill Levothyroxine/BP meds (her machine isn't working to get blood pressure reading) Ongoing fatigue PHQ9 (6)-GAD7 (2) completed.

## 2019-11-03 ENCOUNTER — Other Ambulatory Visit: Payer: Self-pay | Admitting: Physician Assistant

## 2019-11-03 DIAGNOSIS — K2100 Gastro-esophageal reflux disease with esophagitis, without bleeding: Secondary | ICD-10-CM

## 2019-11-05 ENCOUNTER — Telehealth: Payer: Self-pay

## 2019-11-05 DIAGNOSIS — F332 Major depressive disorder, recurrent severe without psychotic features: Secondary | ICD-10-CM

## 2019-11-05 DIAGNOSIS — M797 Fibromyalgia: Secondary | ICD-10-CM

## 2019-11-05 DIAGNOSIS — M5137 Other intervertebral disc degeneration, lumbosacral region: Secondary | ICD-10-CM

## 2019-11-05 DIAGNOSIS — G8929 Other chronic pain: Secondary | ICD-10-CM

## 2019-11-05 DIAGNOSIS — G894 Chronic pain syndrome: Secondary | ICD-10-CM

## 2019-11-05 NOTE — Telephone Encounter (Signed)
Will leave for [name] to address onMonday.  We do not usually use steroids for fibromyalgia.  So I do not know if she has used that for other things in the past but that is not a typical recommended treatment.  As far as the hydrocodone again we do not typically use it for fibro so I just would prefer that we await [name]'s decisions and she knows this patient a little bit better.

## 2019-11-05 NOTE — Telephone Encounter (Signed)
Patient advised of recommendations.  

## 2019-11-05 NOTE — Telephone Encounter (Signed)
Aimee Little called and states she is having a fibromyalgia flare up. She is requesting a refill on the hydrocodone and prednisone. She didn't want a virtual visit. Please advise.

## 2019-11-08 MED ORDER — HYDROCODONE-ACETAMINOPHEN 5-325 MG PO TABS: ORAL_TABLET | ORAL | 0 refills | Status: DC

## 2019-11-08 NOTE — Telephone Encounter (Signed)
Pt on pain contract sent norco. Last fill 08/2019. I did not send prednisone usually that works better for more specific musculoskeletal pain like in joints.

## 2019-11-08 NOTE — Telephone Encounter (Signed)
..  PDMP reviewed during this encounter. Sent hydrocodone for pain.

## 2019-11-09 NOTE — Telephone Encounter (Signed)
Patient advised.

## 2019-11-22 ENCOUNTER — Other Ambulatory Visit: Payer: Self-pay | Admitting: Physician Assistant

## 2019-11-22 DIAGNOSIS — M62830 Muscle spasm of back: Secondary | ICD-10-CM

## 2019-11-23 ENCOUNTER — Other Ambulatory Visit: Payer: Self-pay | Admitting: Physician Assistant

## 2019-11-23 DIAGNOSIS — M62830 Muscle spasm of back: Secondary | ICD-10-CM

## 2019-11-23 NOTE — Telephone Encounter (Signed)
Last filled 11/24/2018 #180 with no refills. PLease advise.

## 2019-11-25 ENCOUNTER — Other Ambulatory Visit: Payer: Self-pay | Admitting: Physician Assistant

## 2019-11-25 DIAGNOSIS — M62830 Muscle spasm of back: Secondary | ICD-10-CM

## 2019-11-29 ENCOUNTER — Telehealth (INDEPENDENT_AMBULATORY_CARE_PROVIDER_SITE_OTHER): Payer: Medicare Other | Admitting: Physician Assistant

## 2019-11-29 ENCOUNTER — Encounter: Payer: Self-pay | Admitting: Physician Assistant

## 2019-11-29 VITALS — Temp 98.7°F | Ht 70.0 in | Wt 206.0 lb

## 2019-11-29 DIAGNOSIS — M62830 Muscle spasm of back: Secondary | ICD-10-CM

## 2019-11-29 DIAGNOSIS — G43909 Migraine, unspecified, not intractable, without status migrainosus: Secondary | ICD-10-CM

## 2019-11-29 DIAGNOSIS — I10 Essential (primary) hypertension: Secondary | ICD-10-CM | POA: Diagnosis not present

## 2019-11-29 DIAGNOSIS — M546 Pain in thoracic spine: Secondary | ICD-10-CM | POA: Diagnosis not present

## 2019-11-29 DIAGNOSIS — M549 Dorsalgia, unspecified: Secondary | ICD-10-CM

## 2019-11-29 DIAGNOSIS — G8929 Other chronic pain: Secondary | ICD-10-CM

## 2019-11-29 MED ORDER — RIZATRIPTAN BENZOATE 10 MG PO TBDP: ORAL_TABLET | ORAL | 1 refills | Status: DC

## 2019-11-29 MED ORDER — AMLODIPINE BESYLATE 10 MG PO TABS: 10.0000 mg | ORAL_TABLET | Freq: Every day | ORAL | 1 refills | Status: DC

## 2019-11-29 MED ORDER — CYCLOBENZAPRINE HCL 10 MG PO TABS: 10.0000 mg | ORAL_TABLET | Freq: Three times a day (TID) | ORAL | 1 refills | Status: DC | PRN

## 2019-11-29 NOTE — Progress Notes (Signed)
Pain in upper back Needing refill on Flexeril Wants refill Maxalt PHQ9 (8) -GAD7 (7) completed.

## 2019-11-29 NOTE — Progress Notes (Signed)
Patient ID: Aimee Little, female   DOB: [birth year], 60 y.o.   MRN: [id number] .Marland KitchenVirtual Visit via Telephone Note  I connected with Teagyn B Philippi on 11/29/19 at  1:20 PM EST by telephone and verified that I am speaking with the correct person using two identifiers.  Location: Patient: home Provider: clinic   I discussed the limitations, risks, security and privacy concerns of performing an evaluation and management service by telephone and the availability of in person appointments. I also discussed with the patient that there may be a patient responsible charge related to this service. The patient expressed understanding and agreed to proceed.   History of Present Illness: Pt is a 59 yo female with chronic pain in low and upper back. Her low back pain is very well controlled. She has had multiple injections which have significantly helped. She has been told there is nothing that could be done for her mid to upper back pain that has been fairly persistent for years. Prednisone at times helps. Muscle relaxers helps. She struggles to lift and use hands for very long. She describes the pain as burning and radiates around into the chest. Her pain is just above nipple line. She takes gabapentin and mobic daily.   .. Active Ambulatory Problems    Diagnosis Date Noted  . HOT FLASHES 12/29/2009  . [place] DISEASE, LUMBAR 12/29/2009  . Fibromyalgia 01/01/2013  . Unspecified vitamin D deficiency 05/16/2013  . Migraine without status migrainosus, not intractable 07/21/2013  . Chronic pain syndrome 11/05/2013  . Anxiety and depression 12/17/2013  . Adenomatous colon polyp 12/20/2013  . Obesity 11/25/2014  . Chronic fatigue 05/26/2015  . Bilateral low back pain without sciatica 09/06/2015  . RLS (restless legs syndrome) 09/22/2015  . Thyroid activity decreased 10/27/2015  . Insomnia 11/21/2015  . Severe episode of recurrent major depressive disorder, without psychotic features ([place]) 11/29/2015  .  Rhinitis, allergic 11/29/2015  . Vaginal atrophy 01/02/2016  . Left knee pain 01/23/2016  . Panic attacks 03/19/2016  . Loose body in knee 04/17/2016  . Chondromalacia of patellofemoral joint 04/17/2016  . Diverticulitis of colon without hemorrhage 07/19/2016  . Nonintractable headache 03/06/2017  . Tachycardia 03/06/2017  . Rib pain on left side 08/03/2017  . Diverticulitis 09/10/2017  . Elevated blood pressure reading 10/01/2017  . Light tobacco smoker 10/01/2017  . Hypertension goal BP (blood pressure) < 130/80 10/01/2017  . Rib pain on right side 11/04/2017  . Traumatic hematoma of eyelid, right, initial encounter 11/04/2017  . DDD (degenerative disc disease), lumbar 04/01/2018  . Acute bilateral low back pain with bilateral sciatica 04/01/2018  . OSA on CPAP 05/29/2018  . Chronic bilateral thoracic back pain 08/04/2018  . Closed fracture of one rib of right side 12/01/2018  . Osteopenia 03/24/2019  . Upper back pain 11/30/2019   Resolved Ambulatory Problems    Diagnosis Date Noted  . Unspecified hypothyroidism 12/29/2009  . TOBACCO ABUSE 06/13/2010  . GRIEF REACTION, ACUTE 12/29/2009  . Depression 02/06/2010  . Osteoarthrosis involving lower leg 12/29/2009  . NAUSEA AND VOMITING 05/02/2010  . Osteoarthritis of left knee 07/21/2013  . Medication management 11/05/2013  . Nausea with vomiting 04/07/2014  . Lumbago 11/25/2014  . Sinusitis 04/13/2015  . Sacroiliac joint dysfunction of both sides 09/06/2015  . Right knee pain 11/07/2015  . GAD (generalized anxiety disorder) 03/19/2016  . Abdominal pain, left lower quadrant 04/25/2016  . Trochanteric bursitis of left hip 01/29/2017  . Low grade fever 04/01/2018   Past  Medical History:  Diagnosis Date  . Migraine   . Thyroid disease   . Tobacco use        Observations/Objective: No acute distress.   .. Today's Vitals   11/29/19 1133  Temp: 98.7 F (37.1 C)  TempSrc: Oral  Weight: 206 lb (93.4 kg)  Height:  5\' 10"  (1.778 m)   Body mass index is 29.56 kg/m.    Assessment and Plan: Marland KitchenMarland KitchenDiagnoses and all orders for this visit:  Chronic bilateral thoracic back pain -     cyclobenzaprine (FLEXERIL) 10 MG tablet; Take 1 tablet (10 mg total) by mouth 3 (three) times daily as needed for muscle spasms.  Lumbar paraspinal muscle spasm -     cyclobenzaprine (FLEXERIL) 10 MG tablet; Take 1 tablet (10 mg total) by mouth 3 (three) times daily as needed for muscle spasms.  Migraine without status migrainosus, not intractable, unspecified migraine type -     rizatriptan (MAXALT-MLT) 10 MG disintegrating tablet; May repeat in 2 hours if needed  Hypertension goal BP (blood pressure) < 130/80 -     amLODipine (NORVASC) 10 MG tablet; Take 1 tablet (10 mg total) by mouth daily.  Upper back pain  refilled muscle relaxer today. Will send exercises for upper back pain. Discussed posture and getting a posture corrector. On NSAID and gabapentin. Use norco as needed. Encouraged icy hot patches and tens unit. Pt does not want to go to PT right now due to Covid pandemic. I think she needs to see Dr. Darene Lamer and get a MRI and consider injections for thoracic spine. Pt will hold off now but consider in the future.     Follow Up Instructions:    I discussed the assessment and treatment plan with the patient. The patient was provided an opportunity to ask questions and all were answered. The patient agreed with the plan and demonstrated an understanding of the instructions.   The patient was advised to call back or seek an in-person evaluation if the symptoms worsen or if the condition fails to improve as anticipated.  I provided 20 minutes of non-face-to-face time during this encounter.   Iran Planas, PA-C

## 2019-11-30 DIAGNOSIS — M549 Dorsalgia, unspecified: Secondary | ICD-10-CM | POA: Insufficient documentation

## 2019-12-10 ENCOUNTER — Ambulatory Visit (INDEPENDENT_AMBULATORY_CARE_PROVIDER_SITE_OTHER): Payer: Medicare Other

## 2019-12-10 ENCOUNTER — Other Ambulatory Visit: Payer: Self-pay

## 2019-12-10 ENCOUNTER — Ambulatory Visit (INDEPENDENT_AMBULATORY_CARE_PROVIDER_SITE_OTHER): Payer: Medicare Other | Admitting: Physician Assistant

## 2019-12-10 VITALS — BP 118/89 | HR 110 | Ht 69.0 in | Wt 214.0 lb

## 2019-12-10 DIAGNOSIS — G894 Chronic pain syndrome: Secondary | ICD-10-CM

## 2019-12-10 DIAGNOSIS — M549 Dorsalgia, unspecified: Secondary | ICD-10-CM

## 2019-12-10 DIAGNOSIS — M5441 Lumbago with sciatica, right side: Secondary | ICD-10-CM

## 2019-12-10 DIAGNOSIS — M5136 Other intervertebral disc degeneration, lumbar region: Secondary | ICD-10-CM

## 2019-12-10 DIAGNOSIS — M5442 Lumbago with sciatica, left side: Secondary | ICD-10-CM

## 2019-12-10 DIAGNOSIS — Z79899 Other long term (current) drug therapy: Secondary | ICD-10-CM

## 2019-12-10 DIAGNOSIS — M5134 Other intervertebral disc degeneration, thoracic region: Secondary | ICD-10-CM

## 2019-12-10 DIAGNOSIS — M546 Pain in thoracic spine: Secondary | ICD-10-CM

## 2019-12-10 DIAGNOSIS — G8929 Other chronic pain: Secondary | ICD-10-CM

## 2019-12-10 DIAGNOSIS — M62830 Muscle spasm of back: Secondary | ICD-10-CM

## 2019-12-10 DIAGNOSIS — M5137 Other intervertebral disc degeneration, lumbosacral region: Secondary | ICD-10-CM | POA: Diagnosis not present

## 2019-12-10 DIAGNOSIS — M797 Fibromyalgia: Secondary | ICD-10-CM

## 2019-12-10 DIAGNOSIS — F332 Major depressive disorder, recurrent severe without psychotic features: Secondary | ICD-10-CM

## 2019-12-10 MED ORDER — HYDROCODONE-ACETAMINOPHEN 5-325 MG PO TABS: ORAL_TABLET | ORAL | 0 refills | Status: DC

## 2019-12-10 MED ORDER — KETOROLAC TROMETHAMINE 60 MG/2ML IM SOLN
60.0000 mg | Freq: Once | INTRAMUSCULAR | Status: AC
  Administered 2019-12-10: 60 mg via INTRAMUSCULAR

## 2019-12-10 MED ORDER — PREDNISONE 20 MG PO TABS: ORAL_TABLET | ORAL | 0 refills | Status: DC

## 2019-12-10 NOTE — Progress Notes (Signed)
Subjective:    Patient ID: Aimee Little, female    DOB: [birth year], 59 y.o.   MRN: [id number]  HPI  Pt is a 59 yo female with chronic back pain due to DDD  that is worsening over the last month or so but worsening significantly in last 2-3 days. She has fibromyalgia and depression that further complicates pain. She is out of norco. She tries to take no more than once a day but recently taking bid for pain. She takes gabapentin 600mg  TID and helps. She uses diclofenac gel with little relief. Uses flexeril and mobid daily. No new injury. She needs refill on norco. Prednisone burst helps her a lot and she request that today as well.   She has had numerous injection in the back before and would consider it again. She would like referral at some point for another group but declines it for now.   .. Active Ambulatory Problems    Diagnosis Date Noted  . HOT FLASHES 12/29/2009  . [place] DISEASE, LUMBAR 12/29/2009  . Fibromyalgia 01/01/2013  . Unspecified vitamin D deficiency 05/16/2013  . Migraine without status migrainosus, not intractable 07/21/2013  . Chronic pain syndrome 11/05/2013  . Anxiety and depression 12/17/2013  . Adenomatous colon polyp 12/20/2013  . Obesity 11/25/2014  . Chronic fatigue 05/26/2015  . Bilateral low back pain without sciatica 09/06/2015  . RLS (restless legs syndrome) 09/22/2015  . Thyroid activity decreased 10/27/2015  . Insomnia 11/21/2015  . Severe episode of recurrent major depressive disorder, without psychotic features ([place]) 11/29/2015  . Rhinitis, allergic 11/29/2015  . Vaginal atrophy 01/02/2016  . Left knee pain 01/23/2016  . Panic attacks 03/19/2016  . Loose body in knee 04/17/2016  . Chondromalacia of patellofemoral joint 04/17/2016  . Diverticulitis of colon without hemorrhage 07/19/2016  . Nonintractable headache 03/06/2017  . Tachycardia 03/06/2017  . Rib pain on left side 08/03/2017  . Diverticulitis 09/10/2017  . Elevated blood pressure  reading 10/01/2017  . Light tobacco smoker 10/01/2017  . Hypertension goal BP (blood pressure) < 130/80 10/01/2017  . Rib pain on right side 11/04/2017  . Traumatic hematoma of eyelid, right, initial encounter 11/04/2017  . DDD (degenerative disc disease), lumbar 04/01/2018  . Acute bilateral low back pain with bilateral sciatica 04/01/2018  . OSA on CPAP 05/29/2018  . Chronic bilateral thoracic back pain 08/04/2018  . Closed fracture of one rib of right side 12/01/2018  . Osteopenia 03/24/2019  . Upper back pain 11/30/2019  . Chronic bilateral low back pain with bilateral sciatica 12/13/2019  . Mid back pain 12/13/2019  . Muscle spasm of back 12/13/2019   Resolved Ambulatory Problems    Diagnosis Date Noted  . Unspecified hypothyroidism 12/29/2009  . TOBACCO ABUSE 06/13/2010  . GRIEF REACTION, ACUTE 12/29/2009  . Depression 02/06/2010  . Osteoarthrosis involving lower leg 12/29/2009  . NAUSEA AND VOMITING 05/02/2010  . Osteoarthritis of left knee 07/21/2013  . Medication management 11/05/2013  . Nausea with vomiting 04/07/2014  . Lumbago 11/25/2014  . Sinusitis 04/13/2015  . Sacroiliac joint dysfunction of both sides 09/06/2015  . Right knee pain 11/07/2015  . GAD (generalized anxiety disorder) 03/19/2016  . Abdominal pain, left lower quadrant 04/25/2016  . Trochanteric bursitis of left hip 01/29/2017  . Low grade fever 04/01/2018   Past Medical History:  Diagnosis Date  . Migraine   . Thyroid disease   . Tobacco use        Review of Systems See HPI.  Objective:   Physical Exam Vitals reviewed.  Constitutional:      Comments: In obvious pain.  HENT:     Head: Normocephalic.  Cardiovascular:     Rate and Rhythm: Normal rate and regular rhythm.  Pulmonary:     Effort: Pulmonary effort is normal.  Musculoskeletal:     Comments: All ROM painful.  No swelling of extermities.    Neurological:     General: No focal deficit present.     Mental Status: She  is alert and oriented to person, place, and time.  Psychiatric:     Comments: Flat affect.            Assessment & Plan:  Marland KitchenMarland KitchenMelissa was seen today for back pain.  Diagnoses and all orders for this visit:  Mid back pain -     DG Thoracic Spine W/Swimmers -     predniSONE (DELTASONE) 20 MG tablet; Take 3 tablets for 3 days, take 2 tablets for 3 days, take 1 tablet for 3 days, take 1/2 tablet for 4 days. -     ketorolac (TORADOL) injection 60 mg  Chronic bilateral low back pain with bilateral sciatica -     DG Lumbar Spine Complete -     predniSONE (DELTASONE) 20 MG tablet; Take 3 tablets for 3 days, take 2 tablets for 3 days, take 1 tablet for 3 days, take 1/2 tablet for 4 days. -     ketorolac (TORADOL) injection 60 mg  DISC DISEASE, LUMBAR -     predniSONE (DELTASONE) 20 MG tablet; Take 3 tablets for 3 days, take 2 tablets for 3 days, take 1 tablet for 3 days, take 1/2 tablet for 4 days. -     HYDROcodone-acetaminophen (NORCO/VICODIN) 5-325 MG tablet; Take no more than once a day for moderate to severe pain.  Chronic bilateral thoracic back pain -     predniSONE (DELTASONE) 20 MG tablet; Take 3 tablets for 3 days, take 2 tablets for 3 days, take 1 tablet for 3 days, take 1/2 tablet for 4 days. -     HYDROcodone-acetaminophen (NORCO/VICODIN) 5-325 MG tablet; Take no more than once a day for moderate to severe pain. -     ketorolac (TORADOL) injection 60 mg  Chronic pain syndrome -     predniSONE (DELTASONE) 20 MG tablet; Take 3 tablets for 3 days, take 2 tablets for 3 days, take 1 tablet for 3 days, take 1/2 tablet for 4 days. -     HYDROcodone-acetaminophen (NORCO/VICODIN) 5-325 MG tablet; Take no more than once a day for moderate to severe pain.  Fibromyalgia -     HYDROcodone-acetaminophen (NORCO/VICODIN) 5-325 MG tablet; Take no more than once a day for moderate to severe pain.  Severe episode of recurrent major depressive disorder, without psychotic features  ([place])  Medication management -     COMPLETE METABOLIC PANEL WITH GFR  Muscle spasm of back   .Marland KitchenPDMP reviewed during this encounter. Refilled norco.   Toradol IM given in office today.  Start prednisone taper.  Increase gabapentin to 900mg  three times a day.  Continue flexeril.  norco as needed.  Repeat xrays it has been a while. Consider repeat MRI and referral out. Pt request to hold on MRI and referral.  Discussed extension exercises.  Continue icy hot patches.  Use tens unit.

## 2019-12-10 NOTE — Patient Instructions (Addendum)
3 tablets up to 3 times a day of gabapentin.  Prednisone taper.  Get xrays.

## 2019-12-11 LAB — COMPLETE METABOLIC PANEL WITH GFR
AG Ratio: 2 (calc) (ref 1.0–2.5)
ALT: 15 U/L (ref 6–29)
AST: 14 U/L (ref 10–35)
Albumin: 4 g/dL (ref 3.6–5.1)
Alkaline phosphatase (APISO): 62 U/L (ref 37–153)
BUN/Creatinine Ratio: 18 (calc) (ref 6–22)
BUN: 18 mg/dL (ref 7–25)
CO2: 27 mmol/L (ref 20–32)
Calcium: 9.4 mg/dL (ref 8.6–10.4)
Chloride: 108 mmol/L (ref 98–110)
Creat: 1.01 mg/dL — ABNORMAL HIGH (ref 0.50–0.99)
GFR, Est African American: 70 mL/min/{1.73_m2} (ref >=60)
GFR, Est Non African American: 60 mL/min/{1.73_m2} (ref >=60)
Globulin: 2 g/dL (calc) (ref 1.9–3.7)
Glucose, Bld: 124 mg/dL — ABNORMAL HIGH (ref 65–99)
Potassium: 3.4 mmol/L — ABNORMAL LOW (ref 3.5–5.3)
Sodium: 143 mmol/L (ref 135–146)
Total Bilirubin: 0.5 mg/dL (ref 0.2–1.2)
Total Protein: 6 g/dL — ABNORMAL LOW (ref 6.1–8.1)

## 2019-12-13 ENCOUNTER — Other Ambulatory Visit: Payer: Self-pay | Admitting: Neurology

## 2019-12-13 ENCOUNTER — Encounter: Payer: Self-pay | Admitting: Physician Assistant

## 2019-12-13 DIAGNOSIS — M5442 Lumbago with sciatica, left side: Secondary | ICD-10-CM | POA: Insufficient documentation

## 2019-12-13 DIAGNOSIS — G8929 Other chronic pain: Secondary | ICD-10-CM | POA: Insufficient documentation

## 2019-12-13 DIAGNOSIS — M5441 Lumbago with sciatica, right side: Secondary | ICD-10-CM | POA: Insufficient documentation

## 2019-12-13 DIAGNOSIS — M549 Dorsalgia, unspecified: Secondary | ICD-10-CM | POA: Insufficient documentation

## 2019-12-13 DIAGNOSIS — E876 Hypokalemia: Secondary | ICD-10-CM

## 2019-12-13 DIAGNOSIS — M62830 Muscle spasm of back: Secondary | ICD-10-CM | POA: Insufficient documentation

## 2019-12-13 NOTE — Progress Notes (Signed)
Call pt:   Protein low. Make sure eating during you pain episodes.  Kidneys look fine.  Potassium just out of normal range on low side. Make sure to eat potassium rich foods.  Recheck in 2 weeks.

## 2019-12-13 NOTE — Progress Notes (Signed)
Call pt:   Active muscle spasms seen on xray.  Lots of degeneration that would warrant a MRI with numbness and tingling and neurological symptoms.  No acute findings.  LOTS of constipation. Take miralax 1 capful twice a day.

## 2019-12-15 NOTE — Progress Notes (Signed)
MRI ordered

## 2019-12-15 NOTE — Addendum Note (Signed)
Addended by: Donella Stade on: 12/15/2019 03:29 PM   Modules accepted: Orders

## 2019-12-26 ENCOUNTER — Other Ambulatory Visit: Payer: Medicare Other

## 2020-01-02 ENCOUNTER — Ambulatory Visit (INDEPENDENT_AMBULATORY_CARE_PROVIDER_SITE_OTHER): Payer: Medicare Other

## 2020-01-02 ENCOUNTER — Other Ambulatory Visit: Payer: Self-pay

## 2020-01-02 DIAGNOSIS — M5134 Other intervertebral disc degeneration, thoracic region: Secondary | ICD-10-CM

## 2020-01-02 DIAGNOSIS — M5136 Other intervertebral disc degeneration, lumbar region: Secondary | ICD-10-CM

## 2020-01-03 NOTE — Progress Notes (Signed)
Call pt:   Your MRI shows lots of degenerative disc disease, disc protrusions, narrowing of spinal canal. You definitely need to see a spine specialist to set up a treatment plan. Are you ok with [place] or do you want to stay in [place]?

## 2020-01-04 DIAGNOSIS — M5134 Other intervertebral disc degeneration, thoracic region: Secondary | ICD-10-CM | POA: Insufficient documentation

## 2020-01-04 NOTE — Progress Notes (Signed)
Call pt: sent referral to [facility] here in [place].

## 2020-01-04 NOTE — Addendum Note (Signed)
Addended by: Donella Stade on: 01/04/2020 08:09 AM   Modules accepted: Orders

## 2020-01-12 ENCOUNTER — Other Ambulatory Visit: Payer: Self-pay

## 2020-01-12 DIAGNOSIS — M797 Fibromyalgia: Secondary | ICD-10-CM

## 2020-01-12 DIAGNOSIS — G8929 Other chronic pain: Secondary | ICD-10-CM

## 2020-01-12 DIAGNOSIS — G894 Chronic pain syndrome: Secondary | ICD-10-CM

## 2020-01-12 DIAGNOSIS — M546 Pain in thoracic spine: Secondary | ICD-10-CM

## 2020-01-12 DIAGNOSIS — M5137 Other intervertebral disc degeneration, lumbosacral region: Secondary | ICD-10-CM

## 2020-01-12 MED ORDER — HYDROCODONE-ACETAMINOPHEN 5-325 MG PO TABS: ORAL_TABLET | ORAL | 0 refills | Status: DC

## 2020-01-12 NOTE — Telephone Encounter (Signed)
Elenie requests a refill on Hydrocodone. She has an appointment with [facility] on April 22 nd.

## 2020-01-19 ENCOUNTER — Other Ambulatory Visit: Payer: Self-pay

## 2020-01-19 ENCOUNTER — Ambulatory Visit (INDEPENDENT_AMBULATORY_CARE_PROVIDER_SITE_OTHER): Payer: Medicare Other | Admitting: Physician Assistant

## 2020-01-19 VITALS — BP 125/65 | HR 94 | Ht 69.0 in | Wt 212.0 lb

## 2020-01-19 DIAGNOSIS — Z1231 Encounter for screening mammogram for malignant neoplasm of breast: Secondary | ICD-10-CM

## 2020-01-19 DIAGNOSIS — R5382 Chronic fatigue, unspecified: Secondary | ICD-10-CM

## 2020-01-19 DIAGNOSIS — Z79899 Other long term (current) drug therapy: Secondary | ICD-10-CM

## 2020-01-19 DIAGNOSIS — E782 Mixed hyperlipidemia: Secondary | ICD-10-CM

## 2020-01-19 DIAGNOSIS — G894 Chronic pain syndrome: Secondary | ICD-10-CM | POA: Diagnosis not present

## 2020-01-19 MED ORDER — MODAFINIL 100 MG PO TABS: 100.0000 mg | ORAL_TABLET | Freq: Every day | ORAL | 1 refills | Status: DC

## 2020-01-19 MED ORDER — KETOROLAC TROMETHAMINE 60 MG/2ML IM SOLN
60.0000 mg | Freq: Once | INTRAMUSCULAR | Status: AC
  Administered 2020-01-19: 60 mg via INTRAMUSCULAR

## 2020-01-19 NOTE — Progress Notes (Signed)
Subjective:    Patient ID: Aimee Little, female    DOB: 02/15/1961, 59 y.o.   MRN: [id number]  HPI  Pt is a 59 yo with hypertension, migraines, OSA,hypothyroidism,  chronic pain in upper, mid, low back, chronic fatigue syndrome, MDD, anxiety who presents to the clinic for follow-up.  She is having worsening burning and neuropathy symptoms in legs and feet. She is having a lot of thoracic and upper back pain as well. She sees orthopedic specialist tomorrow to go over again pain for pain.  Patient has had a long history of chronic fatigue. His symptoms have worsened over the last year. She denies feeling more depressed. She denies any suicidal thoughts or homicidal idealizations. She does not want her depression medications changed. She does feels completely overwhelmed by fatigue for the last year. She struggles to get out of bed. She wonders if there is anything that could help.  .. Active Ambulatory Problems    Diagnosis Date Noted  . HOT FLASHES 12/29/2009  . [place] DISEASE, LUMBAR 12/29/2009  . Fibromyalgia 01/01/2013  . Unspecified vitamin D deficiency 05/16/2013  . Migraine without status migrainosus, not intractable 07/21/2013  . Chronic pain syndrome 11/05/2013  . Anxiety and depression 12/17/2013  . Adenomatous colon polyp 12/20/2013  . Obesity 11/25/2014  . Chronic fatigue 05/26/2015  . Bilateral low back pain without sciatica 09/06/2015  . RLS (restless legs syndrome) 09/22/2015  . Thyroid activity decreased 10/27/2015  . Insomnia 11/21/2015  . Severe episode of recurrent major depressive disorder, without psychotic features ([place]) 11/29/2015  . Rhinitis, allergic 11/29/2015  . Vaginal atrophy 01/02/2016  . Left knee pain 01/23/2016  . Panic attacks 03/19/2016  . Loose body in knee 04/17/2016  . Chondromalacia of patellofemoral joint 04/17/2016  . Diverticulitis of colon without hemorrhage 07/19/2016  . Nonintractable headache 03/06/2017  . Tachycardia 03/06/2017  .  Rib pain on left side 08/03/2017  . Diverticulitis 09/10/2017  . Elevated blood pressure reading 10/01/2017  . Light tobacco smoker 10/01/2017  . Hypertension goal BP (blood pressure) < 130/80 10/01/2017  . Rib pain on right side 11/04/2017  . Traumatic hematoma of eyelid, right, initial encounter 11/04/2017  . DDD (degenerative disc disease), lumbar 04/01/2018  . Acute bilateral low back pain with bilateral sciatica 04/01/2018  . OSA on CPAP 05/29/2018  . Chronic bilateral thoracic back pain 08/04/2018  . Closed fracture of one rib of right side 12/01/2018  . Osteopenia 03/24/2019  . Upper back pain 11/30/2019  . Chronic bilateral low back pain with bilateral sciatica 12/13/2019  . Mid back pain 12/13/2019  . Muscle spasm of back 12/13/2019  . DDD (degenerative disc disease), thoracic 01/04/2020  . Mixed hyperlipidemia 01/20/2020   Resolved Ambulatory Problems    Diagnosis Date Noted  . Unspecified hypothyroidism 12/29/2009  . TOBACCO ABUSE 06/13/2010  . GRIEF REACTION, ACUTE 12/29/2009  . Depression 02/06/2010  . Osteoarthrosis involving lower leg 12/29/2009  . NAUSEA AND VOMITING 05/02/2010  . Osteoarthritis of left knee 07/21/2013  . Medication management 11/05/2013  . Nausea with vomiting 04/07/2014  . Lumbago 11/25/2014  . Sinusitis 04/13/2015  . Sacroiliac joint dysfunction of both sides 09/06/2015  . Right knee pain 11/07/2015  . GAD (generalized anxiety disorder) 03/19/2016  . Abdominal pain, left lower quadrant 04/25/2016  . Trochanteric bursitis of left hip 01/29/2017  . Low grade fever 04/01/2018   Past Medical History:  Diagnosis Date  . Migraine   . Thyroid disease   . Tobacco use  Review of Systems See HPI.     Objective:   Physical Exam Vitals reviewed.  Constitutional:      Appearance: Normal appearance.  Cardiovascular:     Rate and Rhythm: Normal rate and regular rhythm.     Pulses: Normal pulses.  Pulmonary:     Effort: Pulmonary  effort is normal.     Breath sounds: Normal breath sounds.  Neurological:     General: No focal deficit present.     Mental Status: She is alert.  Psychiatric:        Mood and Affect: Mood normal.           Assessment & Plan:  Marland KitchenMarland KitchenMelissa was seen today for fatigue.  Diagnoses and all orders for this visit:  Chronic fatigue -     TSH -     B12 -     VITAMIN D 25 Hydroxy (Vit-D Deficiency, Fractures) -     modafinil (PROVIGIL) 100 MG tablet; Take 1 tablet (100 mg total) by mouth daily. -     CBC -     Fe+TIBC+Fer  Visit for screening mammogram -     MM 3D SCREEN BREAST BILATERAL  Medication management -     COMPLETE METABOLIC PANEL WITH GFR  Mixed hyperlipidemia -     Lipid Panel w/reflex Direct LDL  Chronic pain syndrome -     ketorolac (TORADOL) injection 60 mg   Labs ordered to look for metabolic reasons for fatigue. Pt does not feel "depressed". Her pain is to be addressed tomorrow with orthopedist. Toradol given today. provigil started for CFS. Discussed side effects. Follow up in 6-8 weeks.

## 2020-01-19 NOTE — Patient Instructions (Signed)
Modafinil tablets What is this medicine? MODAFINIL (moe DAF i nil) is used to treat excessive sleepiness caused by certain sleep disorders. This includes narcolepsy, sleep apnea, and shift work sleep disorder. This medicine may be used for other purposes; ask your health care provider or pharmacist if you have questions. COMMON BRAND NAME(S): Provigil What should I tell my health care provider before I take this medicine? They need to know if you have any of these conditions:  history of depression, mania, or other mental disorder  kidney disease  liver disease  an unusual or allergic reaction to modafinil, other medicines, foods, dyes, or preservatives  pregnant or trying to get pregnant  breast-feeding How should I use this medicine? Take this medicine by mouth with a glass of water. Follow the directions on the prescription label. Take your doses at regular intervals. Do not take your medicine more often than directed. Do not stop taking except on your doctor's advice. A special MedGuide will be given to you by the pharmacist with each prescription and refill. Be sure to read this information carefully each time. Talk to your pediatrician regarding the use of this medicine in children. This medicine is not approved for use in children. Overdosage: If you think you have taken too much of this medicine contact a poison control center or emergency room at once. NOTE: This medicine is only for you. Do not share this medicine with others. What if I miss a dose? If you miss a dose, take it as soon as you can. If it is almost time for your next dose, take only that dose. Do not take double or extra doses. What may interact with this medicine? Do not take this medicine with any of the following medications:  amphetamine or dextroamphetamine  dexmethylphenidate or methylphenidate  medicines called MAO Inhibitors like Nardil, Parnate, Marplan, Eldepryl  pemoline  procarbazine This  medicine may also interact with the following medications:  antifungal medicines like itraconazole or ketoconazole  barbiturates like phenobarbital  birth control pills or other hormone-containing birth control devices or implants  carbamazepine  cyclosporine  diazepam  medicines for depression, anxiety, or psychotic disturbances  phenytoin  propranolol  triazolam  warfarin This list may not describe all possible interactions. Give your health care provider a list of all the medicines, herbs, non-prescription drugs, or dietary supplements you use. Also tell them if you smoke, drink alcohol, or use illegal drugs. Some items may interact with your medicine. What should I watch for while using this medicine? Visit your doctor or health care provider for regular checks on your progress. The full effects of this medicine may not be seen right away. This medicine may cause serious skin reactions. They can happen weeks to months after starting the medicine. Contact your health care provider right away if you notice fevers or flu-like symptoms with a rash. The rash may be red or purple and then turn into blisters or peeling of the skin. Or, you might notice a red rash with swelling of the face, lips or lymph nodes in your neck or under your arms. This medicine may affect your concentration, function, or may hide signs that you are tired. You may get dizzy. Do not drive, use machinery, or do anything that needs mental alertness until you know how this drug affects you. Alcohol can make you more dizzy and may interfere with your response to this medicine or your alertness. Avoid alcoholic drinks. Birth control pills may not work properly while  you are taking this medicine. Talk to your doctor about using an extra method of birth control. It is unknown if the effects of this medicine will be increased by the use of caffeine. Caffeine is available in many foods, beverages, and medications. Ask your  doctor if you should limit or change your intake of caffeine-containing products while on this medicine. What side effects may I notice from receiving this medicine? Side effects that you should report to your doctor or health care professional as soon as possible:  allergic reactions like skin rash, itching or hives, swelling of the face, lips, or tongue  anxiety  breathing problems  chest pain  fast, irregular heartbeat  hallucinations  increased blood pressure  rash, fever, and swollen lymph nodes  redness, blistering, peeling or loosening of the skin, including inside the mouth  sore throat, fever, or chills  suicidal thoughts or other mood changes  tremors  vomiting Side effects that usually do not require medical attention (report to your doctor or health care professional if they continue or are bothersome):  headache  nausea, diarrhea, or stomach upset  nervousness  trouble sleeping This list may not describe all possible side effects. Call your doctor for medical advice about side effects. You may report side effects to FDA at 1-800-FDA-1088. Where should I keep my medicine? Keep out of the reach of children. This medicine can be abused. Keep your medicine in a safe place to protect it from theft. Do not share this medicine with anyone. Selling or giving away this medicine is dangerous and against the law. This medicine may cause accidental overdose and death if taken by other adults, children, or pets. Mix any unused medicine with a substance like cat litter or coffee grounds. Then throw the medicine away in a sealed container like a sealed bag or a coffee can with a lid. Do not use the medicine after the expiration date. Store at room temperature between 20 and 25 degrees C (68 and 77 degrees F). NOTE: This sheet is a summary. It may not cover all possible information. If you have questions about this medicine, talk to your doctor, pharmacist, or health care  provider.  2020 Elsevier/Gold Standard (2018-12-23 10:08:08)

## 2020-01-20 ENCOUNTER — Encounter: Payer: Self-pay | Admitting: Physician Assistant

## 2020-01-20 DIAGNOSIS — E782 Mixed hyperlipidemia: Secondary | ICD-10-CM | POA: Insufficient documentation

## 2020-01-20 DIAGNOSIS — E785 Hyperlipidemia, unspecified: Secondary | ICD-10-CM | POA: Insufficient documentation

## 2020-01-20 LAB — CBC
HCT: 47.3 % — ABNORMAL HIGH (ref 35.0–45.0)
Hemoglobin: 16.2 g/dL — ABNORMAL HIGH (ref 11.7–15.5)
MCH: 33.5 pg — ABNORMAL HIGH (ref 27.0–33.0)
MCHC: 34.2 g/dL (ref 32.0–36.0)
MCV: 97.9 fL (ref 80.0–100.0)
MPV: 11.5 fL (ref 7.5–12.5)
Platelets: 257 10*3/uL (ref 140–400)
RBC: 4.83 10*6/uL (ref 3.80–5.10)
RDW: 12.7 % (ref 11.0–15.0)
WBC: 8.7 10*3/uL (ref 3.8–10.8)

## 2020-01-20 LAB — LIPID PANEL W/REFLEX DIRECT LDL
Cholesterol: 225 mg/dL — ABNORMAL HIGH (ref <200)
HDL: 58 mg/dL (ref >=50)
LDL Cholesterol (Calc): 142 mg/dL (calc) — ABNORMAL HIGH
Non-HDL Cholesterol (Calc): 167 mg/dL (calc) — ABNORMAL HIGH (ref <130)
Total CHOL/HDL Ratio: 3.9 (calc) (ref <5.0)
Triglycerides: 127 mg/dL (ref <150)

## 2020-01-20 LAB — COMPLETE METABOLIC PANEL WITH GFR
AG Ratio: 1.8 (calc) (ref 1.0–2.5)
ALT: 16 U/L (ref 6–29)
AST: 13 U/L (ref 10–35)
Albumin: 4.3 g/dL (ref 3.6–5.1)
Alkaline phosphatase (APISO): 57 U/L (ref 37–153)
BUN: 19 mg/dL (ref 7–25)
CO2: 21 mmol/L (ref 20–32)
Calcium: 9.6 mg/dL (ref 8.6–10.4)
Chloride: 108 mmol/L (ref 98–110)
Creat: 0.86 mg/dL (ref 0.50–0.99)
GFR, Est African American: 85 mL/min/{1.73_m2} (ref >=60)
GFR, Est Non African American: 73 mL/min/{1.73_m2} (ref >=60)
Globulin: 2.4 g/dL (calc) (ref 1.9–3.7)
Glucose, Bld: 98 mg/dL (ref 65–99)
Potassium: 3.6 mmol/L (ref 3.5–5.3)
Sodium: 141 mmol/L (ref 135–146)
Total Bilirubin: 0.3 mg/dL (ref 0.2–1.2)
Total Protein: 6.7 g/dL (ref 6.1–8.1)

## 2020-01-20 LAB — TSH: TSH: 13.82 mIU/L — ABNORMAL HIGH (ref 0.40–4.50)

## 2020-01-20 LAB — VITAMIN B12: Vitamin B-12: 464 pg/mL (ref 200–1100)

## 2020-01-20 NOTE — Progress Notes (Signed)
Call patient:  Kidney function improved from 1 month ago.  B12 is good.  Cholesterol is up from 1 year ago. CV risk still low at 4.4 percent in 10 years. No medication needed at this point.  Thyroid is very HYPO low. This could be causing some of your fatigue. Confirm patient is taking the 162mcg daily 30 minutes for eating in am.

## 2020-01-21 ENCOUNTER — Other Ambulatory Visit: Payer: Self-pay | Admitting: Nurse Practitioner

## 2020-01-21 DIAGNOSIS — E039 Hypothyroidism, unspecified: Secondary | ICD-10-CM

## 2020-01-21 MED ORDER — LEVOTHYROXINE SODIUM 125 MCG PO TABS: ORAL_TABLET | ORAL | 1 refills | Status: DC

## 2020-01-21 MED ORDER — LEVOTHYROXINE SODIUM 150 MCG PO TABS: 150.0000 ug | ORAL_TABLET | Freq: Every day | ORAL | 1 refills | Status: DC

## 2020-01-23 ENCOUNTER — Encounter: Payer: Self-pay | Admitting: Physician Assistant

## 2020-01-25 ENCOUNTER — Telehealth: Payer: Self-pay | Admitting: Physician Assistant

## 2020-01-25 NOTE — Telephone Encounter (Signed)
Received fax for PA on Modafinil sent through cover my meds waiting on determination. - CF

## 2020-02-15 ENCOUNTER — Other Ambulatory Visit: Payer: Self-pay | Admitting: Physician Assistant

## 2020-02-15 DIAGNOSIS — I10 Essential (primary) hypertension: Secondary | ICD-10-CM

## 2020-02-24 NOTE — Telephone Encounter (Signed)
Checked on PA through cover my meds and Modafinil is approved from 01/25/2020 - 07/26/2020 - CF

## 2020-02-29 ENCOUNTER — Other Ambulatory Visit: Payer: Self-pay

## 2020-02-29 DIAGNOSIS — M797 Fibromyalgia: Secondary | ICD-10-CM

## 2020-02-29 DIAGNOSIS — G8929 Other chronic pain: Secondary | ICD-10-CM

## 2020-02-29 DIAGNOSIS — G894 Chronic pain syndrome: Secondary | ICD-10-CM

## 2020-02-29 DIAGNOSIS — M5137 Other intervertebral disc degeneration, lumbosacral region: Secondary | ICD-10-CM

## 2020-02-29 MED ORDER — HYDROCODONE-ACETAMINOPHEN 5-325 MG PO TABS: ORAL_TABLET | ORAL | 0 refills | Status: DC

## 2020-02-29 NOTE — Telephone Encounter (Signed)
..  PDMP reviewed during this encounter. No concerns. Small quanity given on 4/14.  If continues to need refills. Needs pain contract.

## 2020-02-29 NOTE — Telephone Encounter (Signed)
Requesting RF for Norco sent to [facility]. Having issues with mid to upper back. RX last sent 01/12/20. RX pended

## 2020-03-22 ENCOUNTER — Other Ambulatory Visit: Payer: Self-pay | Admitting: Physician Assistant

## 2020-03-22 DIAGNOSIS — R5382 Chronic fatigue, unspecified: Secondary | ICD-10-CM

## 2020-03-22 NOTE — Telephone Encounter (Signed)
Last filled 01/19/2020 #30 with one refill Last appt 01/19/2020

## 2020-03-22 NOTE — Telephone Encounter (Signed)
Appointment has been made

## 2020-03-22 NOTE — Telephone Encounter (Signed)
Please schedule patient.

## 2020-04-11 ENCOUNTER — Other Ambulatory Visit: Payer: Self-pay | Admitting: Physician Assistant

## 2020-04-11 DIAGNOSIS — K2100 Gastro-esophageal reflux disease with esophagitis, without bleeding: Secondary | ICD-10-CM

## 2020-04-12 ENCOUNTER — Other Ambulatory Visit: Payer: Self-pay | Admitting: Neurology

## 2020-04-12 ENCOUNTER — Ambulatory Visit: Payer: Medicare Other | Admitting: Physician Assistant

## 2020-04-12 DIAGNOSIS — G8929 Other chronic pain: Secondary | ICD-10-CM

## 2020-04-12 DIAGNOSIS — G894 Chronic pain syndrome: Secondary | ICD-10-CM

## 2020-04-12 DIAGNOSIS — M797 Fibromyalgia: Secondary | ICD-10-CM

## 2020-04-12 DIAGNOSIS — M5137 Other intervertebral disc degeneration, lumbosacral region: Secondary | ICD-10-CM

## 2020-04-12 MED ORDER — HYDROCODONE-ACETAMINOPHEN 5-325 MG PO TABS: ORAL_TABLET | ORAL | 0 refills | Status: DC

## 2020-04-12 NOTE — Telephone Encounter (Signed)
Last filled 02/29/2020 #30 with no refills.  On pain contract

## 2020-04-14 ENCOUNTER — Telehealth: Payer: Medicare Other | Admitting: Physician Assistant

## 2020-04-14 ENCOUNTER — Encounter: Payer: Self-pay | Admitting: Physician Assistant

## 2020-04-14 ENCOUNTER — Telehealth (INDEPENDENT_AMBULATORY_CARE_PROVIDER_SITE_OTHER): Payer: Medicare Other | Admitting: Physician Assistant

## 2020-04-14 DIAGNOSIS — R5382 Chronic fatigue, unspecified: Secondary | ICD-10-CM | POA: Diagnosis not present

## 2020-04-14 DIAGNOSIS — E039 Hypothyroidism, unspecified: Secondary | ICD-10-CM

## 2020-04-14 DIAGNOSIS — M5442 Lumbago with sciatica, left side: Secondary | ICD-10-CM | POA: Diagnosis not present

## 2020-04-14 DIAGNOSIS — M5134 Other intervertebral disc degeneration, thoracic region: Secondary | ICD-10-CM | POA: Diagnosis not present

## 2020-04-14 DIAGNOSIS — F332 Major depressive disorder, recurrent severe without psychotic features: Secondary | ICD-10-CM

## 2020-04-14 DIAGNOSIS — G8929 Other chronic pain: Secondary | ICD-10-CM

## 2020-04-14 DIAGNOSIS — M5441 Lumbago with sciatica, right side: Secondary | ICD-10-CM

## 2020-04-14 MED ORDER — ARMODAFINIL 150 MG PO TABS: 150.0000 mg | ORAL_TABLET | Freq: Every day | ORAL | 2 refills | Status: DC

## 2020-04-14 NOTE — Progress Notes (Signed)
Patient ID: Aimee Little, female   DOB: 03/22/1961, 59 y.o.   MRN: [id number] .Virtual Visit via Video Note  I connected with Aimee Little on 04/17/20 at  2:00 PM EDT by a video enabled telemedicine application and verified that I am speaking with the correct person using two identifiers.  Location: Patient: home Provider: clinic   I discussed the limitations of evaluation and management by telemedicine and the availability of in person appointments. The patient expressed understanding and agreed to proceed.  History of Present Illness: Patient is a 59 year old female with hypertension, migraines, OSA, chronic fatigue, hypothyroidism, chronic mid and low back pain, MDD, GAD, panic attacks who calls into the clinic for follow up.   She continues to have mid back pain her low back pain is much better. She is on mobic PRN, gabapentin, norco PRN. She has not followed up with pain clinic where she gets lower back injections.   Patient Synthroid was recently changed over 4 weeks ago and she needs labs to see if she is in the appropriate range.  She does feel a little bit better with increased dose of levothyroxine.  She still struggles with intense fatigue.  Provigil did not seem to help very much.  She just feels like she literally could sleep all the time.  She feels like her depression and mood is fairly well controlled.  .. Active Ambulatory Problems    Diagnosis Date Noted  . HOT FLASHES 12/29/2009  . [place] DISEASE, LUMBAR 12/29/2009  . Fibromyalgia 01/01/2013  . Unspecified vitamin D deficiency 05/16/2013  . Migraine without status migrainosus, not intractable 07/21/2013  . Chronic pain syndrome 11/05/2013  . Anxiety and depression 12/17/2013  . Adenomatous colon polyp 12/20/2013  . Obesity 11/25/2014  . Chronic fatigue 05/26/2015  . Bilateral low back pain without sciatica 09/06/2015  . RLS (restless legs syndrome) 09/22/2015  . Thyroid activity decreased 10/27/2015  . Insomnia  11/21/2015  . Severe episode of recurrent major depressive disorder, without psychotic features ([place]) 11/29/2015  . Rhinitis, allergic 11/29/2015  . Vaginal atrophy 01/02/2016  . Left knee pain 01/23/2016  . Panic attacks 03/19/2016  . Loose body in knee 04/17/2016  . Chondromalacia of patellofemoral joint 04/17/2016  . Diverticulitis of colon without hemorrhage 07/19/2016  . Nonintractable headache 03/06/2017  . Tachycardia 03/06/2017  . Rib pain on left side 08/03/2017  . Diverticulitis 09/10/2017  . Elevated blood pressure reading 10/01/2017  . Light tobacco smoker 10/01/2017  . Hypertension goal BP (blood pressure) < 130/80 10/01/2017  . Rib pain on right side 11/04/2017  . Traumatic hematoma of eyelid, right, initial encounter 11/04/2017  . DDD (degenerative disc disease), lumbar 04/01/2018  . Acute bilateral low back pain with bilateral sciatica 04/01/2018  . OSA on CPAP 05/29/2018  . Chronic bilateral thoracic back pain 08/04/2018  . Closed fracture of one rib of right side 12/01/2018  . Osteopenia 03/24/2019  . Upper back pain 11/30/2019  . Chronic bilateral low back pain with bilateral sciatica 12/13/2019  . Mid back pain 12/13/2019  . Muscle spasm of back 12/13/2019  . DDD (degenerative disc disease), thoracic 01/04/2020  . Mixed hyperlipidemia 01/20/2020   Resolved Ambulatory Problems    Diagnosis Date Noted  . Unspecified hypothyroidism 12/29/2009  . TOBACCO ABUSE 06/13/2010  . GRIEF REACTION, ACUTE 12/29/2009  . Depression 02/06/2010  . Osteoarthrosis involving lower leg 12/29/2009  . NAUSEA AND VOMITING 05/02/2010  . Osteoarthritis of left knee 07/21/2013  . Medication management 11/05/2013  .  Nausea with vomiting 04/07/2014  . Lumbago 11/25/2014  . Sinusitis 04/13/2015  . Sacroiliac joint dysfunction of both sides 09/06/2015  . Right knee pain 11/07/2015  . GAD (generalized anxiety disorder) 03/19/2016  . Abdominal pain, left lower quadrant 04/25/2016  .  Trochanteric bursitis of left hip 01/29/2017  . Low grade fever 04/01/2018   Past Medical History:  Diagnosis Date  . Migraine   . Thyroid disease   . Tobacco use    Reviewed med, allergy, problem list.    Observations/Objective: No acute distress.  Normal mood and appearance.   .. Today's Vitals   04/14/20 1346  PainSc: 7   PainLoc: Generalized   There is no height or weight on file to calculate BMI.  .. Depression screen [facility] 2/9 04/14/2020 01/19/2020 11/29/2019 11/02/2019 08/03/2019  Decreased Interest 3 3 2 3  0  Down, Depressed, Hopeless 1 1 0 0 0  PHQ - 2 Score 4 4 2 3  0  Altered sleeping 0 3 1 0 -  Tired, decreased energy 3 3 3 3  -  Change in appetite 0 0 0 0 -  Feeling bad or failure about yourself  1 1 0 0 -  Trouble concentrating 1 1 1  0 -  Moving slowly or fidgety/restless 0 0 1 0 -  Suicidal thoughts 0 0 0 0 -  PHQ-9 Score 9 12 8 6  -  Difficult doing work/chores Very difficult Extremely dIfficult Somewhat difficult Somewhat difficult -  Some recent data might be hidden   .Marland Kitchen GAD 7 : Generalized Anxiety Score 04/14/2020 01/19/2020 11/29/2019 11/02/2019  Nervous, Anxious, on Edge 2 1 1 1   Control/stop worrying 2 1 1  0  Worry too much - different things 0 1 1 0  Trouble relaxing 2 0 1 0  Restless 2 1 2 1   Easily annoyed or irritable 1 0 0 0  Afraid - awful might happen 1 1 1  0  Total GAD 7 Score 10 5 7 2   Anxiety Difficulty Not difficult at all Extremely difficult Very difficult Somewhat difficult       Assessment and Plan: Marland KitchenMarland KitchenMelissa was seen today for follow up lab and medication management.  Diagnoses and all orders for this visit:  Chronic bilateral low back pain with bilateral sciatica  Chronic fatigue -     Armodafinil 150 MG tablet; Take 1 tablet (150 mg total) by mouth daily.  Hypothyroidism, unspecified type -     TSH  DDD (degenerative disc disease), thoracic  Severe episode of recurrent major depressive disorder, without psychotic features  ([facility])   Chronic low back pain stable and controlled.  Mid back pain problematic despite medications.  Patient does admit she is not taking the Mobic every day.  I encouraged her to do so.  Also think she should follow-up with pain management where she has gotten injections for her low back.  I think she could potentially benefit from this.  Need to check thyroid to make sure medication is in the appropriate range.  Stop Provigil try Nuvigil for chronic fatigue.  Follow-up in 3 months.   Follow Up Instructions:    I discussed the assessment and treatment plan with the patient. The patient was provided an opportunity to ask questions and all were answered. The patient agreed with the plan and demonstrated an understanding of the instructions.   The patient was advised to call back or seek an in-person evaluation if the symptoms worsen or if the condition fails to improve as anticipated.  I provided 13 minutes of non-face-to-face time during this encounter.   Iran Planas, PA-C

## 2020-04-17 ENCOUNTER — Encounter: Payer: Self-pay | Admitting: Physician Assistant

## 2020-04-21 LAB — TSH: TSH: 4.8 mIU/L — ABNORMAL HIGH (ref 0.40–4.50)

## 2020-05-05 ENCOUNTER — Other Ambulatory Visit: Payer: Self-pay | Admitting: Physician Assistant

## 2020-05-05 DIAGNOSIS — F332 Major depressive disorder, recurrent severe without psychotic features: Secondary | ICD-10-CM

## 2020-05-08 ENCOUNTER — Other Ambulatory Visit: Payer: Self-pay | Admitting: Nurse Practitioner

## 2020-05-08 DIAGNOSIS — E039 Hypothyroidism, unspecified: Secondary | ICD-10-CM

## 2020-05-08 MED ORDER — LEVOTHYROXINE SODIUM 150 MCG PO TABS: 150.0000 ug | ORAL_TABLET | Freq: Every day | ORAL | 1 refills | Status: DC

## 2020-05-08 NOTE — Progress Notes (Signed)
TSH just slightly elevated still.  Are you taking 168mcg every morning on an empty stomach (30 minutes before meals or any other medication?  If you are- then lets change the dose schedule to the following:  Sunday 111mcg (1 tab) Monday 118mcg  Tuesday 212mcg (1.5 tabs) Wednesday 165mcg Thursday 263mcg (1.5 tabs) Friday 123mcg Saturday 194mcg  We will plan to recheck these labs in 6 weeks to see if this has you where you need to be.

## 2020-05-16 ENCOUNTER — Other Ambulatory Visit: Payer: Self-pay

## 2020-05-16 DIAGNOSIS — M797 Fibromyalgia: Secondary | ICD-10-CM

## 2020-05-16 DIAGNOSIS — G8929 Other chronic pain: Secondary | ICD-10-CM

## 2020-05-16 DIAGNOSIS — M5137 Other intervertebral disc degeneration, lumbosacral region: Secondary | ICD-10-CM

## 2020-05-16 DIAGNOSIS — G894 Chronic pain syndrome: Secondary | ICD-10-CM

## 2020-05-17 ENCOUNTER — Ambulatory Visit: Payer: Medicare Other | Admitting: Physician Assistant

## 2020-05-17 MED ORDER — HYDROCODONE-ACETAMINOPHEN 5-325 MG PO TABS: ORAL_TABLET | ORAL | 0 refills | Status: DC

## 2020-05-26 ENCOUNTER — Ambulatory Visit: Payer: Medicare Other | Admitting: Physician Assistant

## 2020-06-01 ENCOUNTER — Encounter: Payer: Self-pay | Admitting: Physician Assistant

## 2020-06-01 ENCOUNTER — Telehealth (INDEPENDENT_AMBULATORY_CARE_PROVIDER_SITE_OTHER): Payer: Medicare Other | Admitting: Physician Assistant

## 2020-06-01 VITALS — BP 126/77 | HR 96 | Ht 69.0 in | Wt 212.0 lb

## 2020-06-01 DIAGNOSIS — F332 Major depressive disorder, recurrent severe without psychotic features: Secondary | ICD-10-CM | POA: Diagnosis not present

## 2020-06-01 DIAGNOSIS — F411 Generalized anxiety disorder: Secondary | ICD-10-CM

## 2020-06-01 DIAGNOSIS — F410 Panic disorder [episodic paroxysmal anxiety] without agoraphobia: Secondary | ICD-10-CM | POA: Diagnosis not present

## 2020-06-01 MED ORDER — ALPRAZOLAM 0.5 MG PO TABS: 0.5000 mg | ORAL_TABLET | Freq: Two times a day (BID) | ORAL | 1 refills | Status: DC | PRN

## 2020-06-01 NOTE — Progress Notes (Signed)
Patient ID: Aimee Little, female   DOB: 1960/12/15, 59 y.o.   MRN: [id number] .Marland KitchenVirtual Visit via Telephone Note  I connected with Aimee Little on 06/01/20 at 10:10 AM EDT by telephone and verified that I am speaking with the correct person using two identifiers.  Location: Patient: home Provider: clinic   I discussed the limitations, risks, security and privacy concerns of performing an evaluation and management service by telephone and the availability of in person appointments. I also discussed with the patient that there may be a patient responsible charge related to this service. The patient expressed understanding and agreed to proceed.   History of Present Illness: Patient is a 59 year old female with major depression, generalized anxiety, panic attacks who presents to the clinic with worsening of anxiety.  She was doing much better when the Covid cases had improved.  Ever since the new delta variant she has been more more scared to go out.  She almost cannot even go to the grocery store.  She gets very anxious and cannot stop her thoughts.  She also experiences chest tightness, shortness of breath, clammy skin, flushing.  She cannot stop thinking about that she will die if she gets Covid.  Patient is vaccinated.  Patient does live alone.  Patient does avoid public spaces.  She feels like she needs Xanax again to use as needed.no suicidal thoughts or homicidal ideations and no hallucinations.  .. Active Ambulatory Problems    Diagnosis Date Noted  . HOT FLASHES 12/29/2009  . [place] DISEASE, LUMBAR 12/29/2009  . Fibromyalgia 01/01/2013  . Unspecified vitamin D deficiency 05/16/2013  . Migraine without status migrainosus, not intractable 07/21/2013  . Chronic pain syndrome 11/05/2013  . Anxiety and depression 12/17/2013  . Adenomatous colon polyp 12/20/2013  . Obesity 11/25/2014  . Chronic fatigue 05/26/2015  . Bilateral low back pain without sciatica 09/06/2015  . RLS (restless  legs syndrome) 09/22/2015  . Thyroid activity decreased 10/27/2015  . Insomnia 11/21/2015  . Severe episode of recurrent major depressive disorder, without psychotic features ([place]) 11/29/2015  . Rhinitis, allergic 11/29/2015  . Vaginal atrophy 01/02/2016  . Left knee pain 01/23/2016  . GAD (generalized anxiety disorder) 03/19/2016  . Panic attacks 03/19/2016  . Loose body in knee 04/17/2016  . Chondromalacia of patellofemoral joint 04/17/2016  . Diverticulitis of colon without hemorrhage 07/19/2016  . Nonintractable headache 03/06/2017  . Tachycardia 03/06/2017  . Rib pain on left side 08/03/2017  . Diverticulitis 09/10/2017  . Elevated blood pressure reading 10/01/2017  . Light tobacco smoker 10/01/2017  . Hypertension goal BP (blood pressure) < 130/80 10/01/2017  . Rib pain on right side 11/04/2017  . Traumatic hematoma of eyelid, right, initial encounter 11/04/2017  . DDD (degenerative disc disease), lumbar 04/01/2018  . Acute bilateral low back pain with bilateral sciatica 04/01/2018  . OSA on CPAP 05/29/2018  . Chronic bilateral thoracic back pain 08/04/2018  . Closed fracture of one rib of right side 12/01/2018  . Osteopenia 03/24/2019  . Upper back pain 11/30/2019  . Chronic bilateral low back pain with bilateral sciatica 12/13/2019  . Mid back pain 12/13/2019  . Muscle spasm of back 12/13/2019  . DDD (degenerative disc disease), thoracic 01/04/2020  . Mixed hyperlipidemia 01/20/2020   Resolved Ambulatory Problems    Diagnosis Date Noted  . Unspecified hypothyroidism 12/29/2009  . TOBACCO ABUSE 06/13/2010  . GRIEF REACTION, ACUTE 12/29/2009  . Depression 02/06/2010  . Osteoarthrosis involving lower leg 12/29/2009  . NAUSEA AND VOMITING  05/02/2010  . Osteoarthritis of left knee 07/21/2013  . Medication management 11/05/2013  . Nausea with vomiting 04/07/2014  . Lumbago 11/25/2014  . Sinusitis 04/13/2015  . Sacroiliac joint dysfunction of both sides 09/06/2015  .  Right knee pain 11/07/2015  . Abdominal pain, left lower quadrant 04/25/2016  . Trochanteric bursitis of left hip 01/29/2017  . Low grade fever 04/01/2018   Past Medical History:  Diagnosis Date  . Migraine   . Thyroid disease   . Tobacco use    Reviewed med, allergy, problem list.     Observations/Objective: Pt is very anxious. She is tearful on the phone. She just "wants the pandemic to be over".   .. Today's Vitals   06/01/20 0952  BP: 126/77  Pulse: 96  Weight: 212 lb (96.2 kg)  Height: 5\' 9"  (1.753 m)   Body mass index is 31.31 kg/m.  .. Depression screen [facility] 2/9 06/01/2020 04/14/2020 01/19/2020 11/29/2019 11/02/2019  Decreased Interest 3 3 3 2 3   Down, Depressed, Hopeless 2 1 1  0 0  PHQ - 2 Score 5 4 4 2 3   Altered sleeping 3 0 3 1 0  Tired, decreased energy 3 3 3 3 3   Change in appetite 0 0 0 0 0  Feeling bad or failure about yourself  0 1 1 0 0  Trouble concentrating 3 1 1 1  0  Moving slowly or fidgety/restless 3 0 0 1 0  Suicidal thoughts 0 0 0 0 0  PHQ-9 Score 17 9 12 8 6   Difficult doing work/chores Extremely dIfficult Very difficult Extremely dIfficult Somewhat difficult Somewhat difficult  Some recent data might be hidden   .Marland Kitchen GAD 7 : Generalized Anxiety Score 06/01/2020 04/14/2020 01/19/2020 11/29/2019  Nervous, Anxious, on Edge 3 2 1 1   Control/stop worrying 3 2 1 1   Worry too much - different things 3 0 1 1  Trouble relaxing 3 2 0 1  Restless 3 2 1 2   Easily annoyed or irritable 2 1 0 0  Afraid - awful might happen 3 1 1 1   Total GAD 7 Score 20 10 5 7   Anxiety Difficulty Very difficult Not difficult at all Extremely difficult Very difficult     Assessment and Plan: Marland KitchenMarland KitchenMelissa was seen today for fatigue.  Diagnoses and all orders for this visit:  Panic attacks -     ALPRAZolam (XANAX) 0.5 MG tablet; Take 1 tablet (0.5 mg total) by mouth 2 (two) times daily as needed for anxiety.  GAD (generalized anxiety disorder)  Severe episode of recurrent major  depressive disorder, without psychotic features ([place])   Increased buspar to 15mg  TID.  Increased celexa to 60mg  qd.  Added xanax for as needed panic attacks. Do not use with norco due to increase risk of sudden death. Discuss risk of dependency with controlled substances.  Discussed breathing/meditation.  Follow up in 4 weeks.    Follow Up Instructions:    I discussed the assessment and treatment plan with the patient. The patient was provided an opportunity to ask questions and all were answered. The patient agreed with the plan and demonstrated an understanding of the instructions.   The patient was advised to call back or seek an in-person evaluation if the symptoms worsen or if the condition fails to improve as anticipated.  I provided  12 minutes of non-face-to-face time during this encounter.   Iran Planas, PA-C

## 2020-06-01 NOTE — Progress Notes (Signed)
Still feeling tired all the time PHQ9 (9) -GAD7 (10) completed.  Feels like she needs medication for anxiety

## 2020-06-22 ENCOUNTER — Other Ambulatory Visit: Payer: Self-pay

## 2020-06-22 DIAGNOSIS — M797 Fibromyalgia: Secondary | ICD-10-CM

## 2020-06-22 DIAGNOSIS — G8929 Other chronic pain: Secondary | ICD-10-CM

## 2020-06-22 DIAGNOSIS — M5137 Other intervertebral disc degeneration, lumbosacral region: Secondary | ICD-10-CM

## 2020-06-22 DIAGNOSIS — G894 Chronic pain syndrome: Secondary | ICD-10-CM

## 2020-06-22 MED ORDER — HYDROCODONE-ACETAMINOPHEN 5-325 MG PO TABS: ORAL_TABLET | ORAL | 0 refills | Status: DC

## 2020-06-22 NOTE — Telephone Encounter (Signed)
Pt called requesting med refill for hydrocodone-ace. Rx pended.

## 2020-06-22 NOTE — Telephone Encounter (Signed)
On pain contract.  Sent 1 month.  Last appt 9/2

## 2020-06-23 ENCOUNTER — Telehealth (INDEPENDENT_AMBULATORY_CARE_PROVIDER_SITE_OTHER): Payer: Medicare Other | Admitting: Medical-Surgical

## 2020-06-23 ENCOUNTER — Encounter: Payer: Self-pay | Admitting: Medical-Surgical

## 2020-06-23 ENCOUNTER — Telehealth: Payer: Medicare Other | Admitting: Medical-Surgical

## 2020-06-23 DIAGNOSIS — G8929 Other chronic pain: Secondary | ICD-10-CM | POA: Diagnosis not present

## 2020-06-23 DIAGNOSIS — M5441 Lumbago with sciatica, right side: Secondary | ICD-10-CM

## 2020-06-23 DIAGNOSIS — M5442 Lumbago with sciatica, left side: Secondary | ICD-10-CM

## 2020-06-23 MED ORDER — PREDNISONE 20 MG PO TABS: ORAL_TABLET | ORAL | 0 refills | Status: AC

## 2020-06-23 NOTE — Progress Notes (Signed)
Virtual Visit via Video Note  I connected with Aimee Little on 06/23/20 at  9:40 AM EDT by telephone and verified that I am speaking with the correct person using two identifiers.   I discussed the limitations of evaluation and management by telemedicine and the availability of in person appointments. The patient expressed understanding and agreed to proceed.  Patient location: home Provider locations: office  Subjective:    CC: back pain  HPI: Pleasant 59 year old female presenting via telephone for exacerbation of chronic back pain.  Reports she was out yesterday shopping and was on her feet for too long.  Now her back pain is rated at 10/10 in the lower lumbar area.  Endorses intermittent flares of pain.  She usually comes in and gets a shot of Toradol and steroids but she reports her pain is significantly enough that she is unable to walk at this time.  She did have some swelling of her bilateral feet yesterday but this has resolved after elevating them last night.  She has tried cyclobenzaprine and hydrocodone codon which was only minimally helpful.  Denies fever, chills, chest pain, shortness of breath, numbness/tingling, saddle paresthesias, lower extremity weakness, and incontinence.  Reports she has a wedding to go to tomorrow and really hopes to get well enough that she is able to attend.  Past medical history, Surgical history, Family history not pertinant except as noted below, Social history, Allergies, and medications have been entered into the medical record, reviewed, and corrections made.   Review of Systems: See HPI for pertinent positives and negatives.   Objective:    General: Speaking clearly in complete sentences without any shortness of breath.  Alert and oriented x3.  Normal judgment. No apparent acute distress.  Impression and Recommendations:    1. Chronic bilateral low back pain with bilateral sciatica Since she is unable to come into the office, sending in a  prednisone taper.  Continue hydrocodone, meloxicam, and Flexeril as prescribed.  Recommend trying heat/ice, massage, and stretching as tolerated.  If prednisone orally is not helpful, she may have to go to urgent care for further evaluation and treatment over the weekend.  Return if symptoms worsen or fail to improve.  20 minutes of non face-to-face time was provided during this encounter.  I discussed the assessment and treatment plan with the patient. The patient was provided an opportunity to ask questions and all were answered. The patient agreed with the plan and demonstrated an understanding of the instructions.   The patient was advised to call back or seek an in-person evaluation if the symptoms worsen or if the condition fails to improve as anticipated.  Clearnce Sorrel, DNP, APRN, FNP-BC [facility]

## 2020-06-27 ENCOUNTER — Other Ambulatory Visit: Payer: Self-pay | Admitting: Physician Assistant

## 2020-06-27 DIAGNOSIS — K2100 Gastro-esophageal reflux disease with esophagitis, without bleeding: Secondary | ICD-10-CM

## 2020-06-27 NOTE — Telephone Encounter (Signed)
Last written 07/30/2019 #90 with 1 refill, okay to refill?

## 2020-06-30 ENCOUNTER — Other Ambulatory Visit: Payer: Self-pay | Admitting: Physician Assistant

## 2020-07-18 ENCOUNTER — Other Ambulatory Visit: Payer: Self-pay

## 2020-07-18 ENCOUNTER — Ambulatory Visit (INDEPENDENT_AMBULATORY_CARE_PROVIDER_SITE_OTHER): Payer: Medicare Other | Admitting: Physician Assistant

## 2020-07-18 VITALS — BP 131/77 | HR 118 | Ht 69.0 in | Wt 220.0 lb

## 2020-07-18 DIAGNOSIS — M5441 Lumbago with sciatica, right side: Secondary | ICD-10-CM | POA: Diagnosis not present

## 2020-07-18 DIAGNOSIS — R000 Tachycardia, unspecified: Secondary | ICD-10-CM

## 2020-07-18 DIAGNOSIS — M5442 Lumbago with sciatica, left side: Secondary | ICD-10-CM

## 2020-07-18 DIAGNOSIS — M5137 Other intervertebral disc degeneration, lumbosacral region: Secondary | ICD-10-CM

## 2020-07-18 DIAGNOSIS — R600 Localized edema: Secondary | ICD-10-CM

## 2020-07-18 DIAGNOSIS — G8929 Other chronic pain: Secondary | ICD-10-CM | POA: Diagnosis not present

## 2020-07-18 DIAGNOSIS — M546 Pain in thoracic spine: Secondary | ICD-10-CM

## 2020-07-18 DIAGNOSIS — E039 Hypothyroidism, unspecified: Secondary | ICD-10-CM

## 2020-07-18 DIAGNOSIS — G894 Chronic pain syndrome: Secondary | ICD-10-CM

## 2020-07-18 DIAGNOSIS — M797 Fibromyalgia: Secondary | ICD-10-CM

## 2020-07-18 LAB — POCT UA - MICROALBUMIN
Albumin/Creatinine Ratio, Urine, POC: <30
Creatinine, POC: 50 mg/dL
Microalbumin Ur, POC: 10 mg/L

## 2020-07-18 MED ORDER — HYDROCODONE-ACETAMINOPHEN 5-325 MG PO TABS: ORAL_TABLET | ORAL | 0 refills | Status: DC

## 2020-07-18 NOTE — Patient Instructions (Addendum)
Get labs.  Increase norco to twice a day.  Use compression stockings.  Limit salt.   Chronic Venous Insufficiency Chronic venous insufficiency is a condition where the leg veins cannot effectively pump blood from the legs to the heart. This happens when the vein walls are either stretched, weakened, or damaged, or when the valves inside the vein are damaged. With the right treatment, you should be able to continue with an active life. This condition is also called venous stasis. What are the causes? Common causes of this condition include:  High blood pressure inside the veins (venous hypertension).  Sitting or standing too long, causing increased blood pressure in the leg veins.  A blood clot that blocks blood flow in a vein (deep vein thrombosis, DVT).  Inflammation of a vein (phlebitis) that causes a blood clot to form.  Tumors in the pelvis that cause blood to back up. What increases the risk? The following factors may make you more likely to develop this condition:  Having a family history of this condition.  Obesity.  Pregnancy.  Living without enough regular physical activity or exercise (sedentary lifestyle).  Smoking.  Having a job that requires long periods of standing or sitting in one place.  Being a certain age. Women in their 16s and 65s and men in their 6s are more likely to develop this condition. What are the signs or symptoms? Symptoms of this condition include:  Veins that are enlarged, bulging, or twisted (varicose veins).  Skin breakdown or ulcers.  Reddened skin or dark discoloration of skin on the leg between the knee and ankle.  Brown, smooth, tight, and painful skin just above the ankle, usually on the inside of the leg (lipodermatosclerosis).  Swelling of the legs. How is this diagnosed? This condition may be diagnosed based on:  Your medical history.  A physical exam.  Tests, such as: ? A procedure that creates an image of a blood  vessel and nearby organs and provides information about blood flow through the blood vessel (duplex ultrasound). ? A procedure that tests blood flow (plethysmography). ? A procedure that looks at the veins using X-ray and dye (venogram). How is this treated? The goals of treatment are to help you return to an active life and to minimize pain or disability. Treatment depends on the severity of your condition, and it may include:  Wearing compression stockings. These can help relieve symptoms and help prevent your condition from getting worse. However, they do not cure the condition.  Sclerotherapy. This procedure involves an injection of a solution that shrinks damaged veins.  Surgery. This may involve: ? Removing a diseased vein (vein stripping). ? Cutting off blood flow through the vein (laser ablation surgery). ? Repairing or reconstructing a valve within the affected vein. Follow these instructions at home:      Wear compression stockings as told by your health care provider. These stockings help to prevent blood clots and reduce swelling in your legs.  Take over-the-counter and prescription medicines only as told by your health care provider.  Stay active by exercising, walking, or doing different activities. Ask your health care provider what activities are safe for you and how much exercise you need.  Drink enough fluid to keep your urine pale yellow.  Do not use any products that contain nicotine or tobacco, such as cigarettes, e-cigarettes, and chewing tobacco. If you need help quitting, ask your health care provider.  Keep all follow-up visits as told by your health care  provider. This is important. Contact a health care provider if you:  Have redness, swelling, or more pain in the affected area.  See a red streak or line that goes up or down from the affected area.  Have skin breakdown or skin loss in the affected area, even if the breakdown is small.  Get an injury in  the affected area. Get help right away if:  You get an injury and an open wound in the affected area.  You have: ? Severe pain that does not get better with medicine. ? Sudden numbness or weakness in the foot or ankle below the affected area. ? Trouble moving your foot or ankle. ? A fever. ? Worse or persistent symptoms. ? Chest pain. ? Shortness of breath. Summary  Chronic venous insufficiency is a condition where the leg veins cannot effectively pump blood from the legs to the heart.  Chronic venous insufficiency occurs when the vein walls become stretched, weakened, or damaged, or when valves within the vein are damaged.  Treatment depends on how severe your condition is. It often involves wearing compression stockings and may involve having a procedure.  Make sure you stay active by exercising, walking, or doing different activities. Ask your health care provider what activities are safe for you and how much exercise you need. This information is not intended to replace advice given to you by your health care provider. Make sure you discuss any questions you have with your health care provider. Document Revised: 06/09/2018 Document Reviewed: 06/09/2018 Elsevier Patient Education  Aimee Little.

## 2020-07-18 NOTE — Progress Notes (Signed)
Subjective:    Patient ID: Aimee Little, female    DOB: 27-Mar-1961, 59 y.o.   MRN: [id number]  HPI  Patient is a 59 year old female with hypertension, migraines, OSA, chronic pain who presents to the clinic to follow-up on intermittent bilateral leg swelling.  She got so worried about the swelling on Saturday that she went to [place] urgent care.  She was not happy with her visit there.  She felt like they did not do anything had just sent her pain medicine.  She did not pick up the pain medicine.  She denies any shortness of breath, chest pain, fever.  She does not have any leg redness.  Her legs tend to be better in the morning and worse as she goes through the days.  She has not really tried anything to make better.  She is taking her thyroid medication regularly.  She is also on Mobic, Norco, Requip, gabapentin, levothyroxine, HCTZ. She is not using compression stockings.   .. Active Ambulatory Problems    Diagnosis Date Noted  . HOT FLASHES 12/29/2009  . [place] DISEASE, LUMBAR 12/29/2009  . Fibromyalgia 01/01/2013  . Unspecified vitamin D deficiency 05/16/2013  . Migraine without status migrainosus, not intractable 07/21/2013  . Chronic pain syndrome 11/05/2013  . Anxiety and depression 12/17/2013  . Adenomatous colon polyp 12/20/2013  . Obesity 11/25/2014  . Chronic fatigue 05/26/2015  . Bilateral low back pain without sciatica 09/06/2015  . RLS (restless legs syndrome) 09/22/2015  . Thyroid activity decreased 10/27/2015  . Insomnia 11/21/2015  . Severe episode of recurrent major depressive disorder, without psychotic features ([place]) 11/29/2015  . Rhinitis, allergic 11/29/2015  . Vaginal atrophy 01/02/2016  . Left knee pain 01/23/2016  . GAD (generalized anxiety disorder) 03/19/2016  . Panic attacks 03/19/2016  . Loose body in knee 04/17/2016  . Chondromalacia of patellofemoral joint 04/17/2016  . Diverticulitis of colon without hemorrhage 07/19/2016  .  Nonintractable headache 03/06/2017  . Tachycardia 03/06/2017  . Rib pain on left side 08/03/2017  . Diverticulitis 09/10/2017  . Elevated blood pressure reading 10/01/2017  . Light tobacco smoker 10/01/2017  . Hypertension goal BP (blood pressure) < 130/80 10/01/2017  . Rib pain on right side 11/04/2017  . Traumatic hematoma of eyelid, right, initial encounter 11/04/2017  . DDD (degenerative disc disease), lumbar 04/01/2018  . Acute bilateral low back pain with bilateral sciatica 04/01/2018  . OSA on CPAP 05/29/2018  . Chronic bilateral thoracic back pain 08/04/2018  . Closed fracture of one rib of right side 12/01/2018  . Osteopenia 03/24/2019  . Upper back pain 11/30/2019  . Chronic bilateral low back pain with bilateral sciatica 12/13/2019  . Mid back pain 12/13/2019  . Muscle spasm of back 12/13/2019  . DDD (degenerative disc disease), thoracic 01/04/2020  . Mixed hyperlipidemia 01/20/2020   Resolved Ambulatory Problems    Diagnosis Date Noted  . Unspecified hypothyroidism 12/29/2009  . TOBACCO ABUSE 06/13/2010  . GRIEF REACTION, ACUTE 12/29/2009  . Depression 02/06/2010  . Osteoarthrosis involving lower leg 12/29/2009  . NAUSEA AND VOMITING 05/02/2010  . Osteoarthritis of left knee 07/21/2013  . Medication management 11/05/2013  . Nausea with vomiting 04/07/2014  . Lumbago 11/25/2014  . Sinusitis 04/13/2015  . Sacroiliac joint dysfunction of both sides 09/06/2015  . Right knee pain 11/07/2015  . Abdominal pain, left lower quadrant 04/25/2016  . Trochanteric bursitis of left hip 01/29/2017  . Low grade fever 04/01/2018   Past Medical History:  Diagnosis Date  .  Migraine   . Thyroid disease   . Tobacco use        Review of Systems  All other systems reviewed and are negative.      Objective:   Physical Exam Vitals reviewed.  Constitutional:      Appearance: Normal appearance.  Neck:     Vascular: No carotid bruit.  Cardiovascular:     Rate and  Rhythm: Regular rhythm. Tachycardia present.     Pulses: Normal pulses.  Pulmonary:     Effort: Pulmonary effort is normal.     Breath sounds: Normal breath sounds.  Musculoskeletal:     Comments: Scant bilateral edema.   Neurological:     General: No focal deficit present.     Mental Status: She is alert and oriented to person, place, and time.  Psychiatric:        Mood and Affect: Mood normal.       .. Results for orders placed or performed in visit on 07/18/20  TSH  Result Value Ref Range   TSH 0.19 (L) 0.40 - 4.50 mIU/L  COMPLETE METABOLIC PANEL WITH GFR  Result Value Ref Range   Glucose, Bld 120 65 - 139 mg/dL   BUN 18 7 - 25 mg/dL   Creat 0.75 0.50 - 0.99 mg/dL   GFR, Est Non African American 87 > OR = 60 mL/min/1.76m2   GFR, Est African American 100 > OR = 60 mL/min/1.33m2   BUN/Creatinine Ratio NOT APPLICABLE 6 - 22 (calc)   Sodium 139 135 - 146 mmol/L   Potassium 3.5 3.5 - 5.3 mmol/L   Chloride 107 98 - 110 mmol/L   CO2 21 20 - 32 mmol/L   Calcium 9.7 8.6 - 10.4 mg/dL   Total Protein 7.0 6.1 - 8.1 g/dL   Albumin 4.4 3.6 - 5.1 g/dL   Globulin 2.6 1.9 - 3.7 g/dL (calc)   AG Ratio 1.7 1.0 - 2.5 (calc)   Total Bilirubin 0.4 0.2 - 1.2 mg/dL   Alkaline phosphatase (APISO) 75 37 - 153 U/L   AST 20 10 - 35 U/L   ALT 28 6 - 29 U/L  DRUG MONITORING, PANEL 6 WITH CONFIRMATION, URINE  Result Value Ref Range   Alcohol Metabolites POSITIVE (A) ng/mL   Ethyl Glucuronide (ETG) 2,578 (H) <500 ng/mL   Ethyl Sulfate (ETS) 1,157 (H) <100 ng/mL   Alcohol Metab Comments     Amphetamines NEGATIVE <500 ng/mL   Barbiturates NEGATIVE <300 ng/mL   Benzodiazepines NEGATIVE <100 ng/mL   Cocaine Metabolite NEGATIVE <150 ng/mL   6 Acetylmorphine NEGATIVE <10 ng/mL   Marijuana Metabolite NEGATIVE <20 ng/mL   Methadone Metabolite NEGATIVE <100 ng/mL   Opiates NEGATIVE <100 ng/mL   Oxycodone NEGATIVE <100 ng/mL   Phencyclidine NEGATIVE <25 ng/mL   Creatinine 43.4 mg/dL   pH 5.1 4.5  - 9.0   Oxidant NEGATIVE mcg/mL  DM TEMPLATE  Result Value Ref Range   Notes and Comments    POCT UA - Microalbumin  Result Value Ref Range   Microalbumin Ur, POC 10 mg/L   Creatinine, POC 50 mg/dL   Albumin/Creatinine Ratio, Urine, POC <30        Assessment & Plan:  Marland KitchenMarland KitchenMelissa was seen today for leg swelling and back pain.  Diagnoses and all orders for this visit:  Bilateral lower extremity edema -     TSH -     COMPLETE METABOLIC PANEL WITH GFR -     POCT UA - Microalbumin  Chronic bilateral low back pain with bilateral sciatica -     POCT UA - Microalbumin  DISC DISEASE, LUMBAR -     Discontinue: HYDROcodone-acetaminophen (NORCO/VICODIN) 5-325 MG tablet; Take no more than once a day for moderate to severe pain. -     HYDROcodone-acetaminophen (NORCO/VICODIN) 5-325 MG tablet; Take one tablet twice daily PRN for moderate to severe pain.  Chronic bilateral thoracic back pain -     Discontinue: HYDROcodone-acetaminophen (NORCO/VICODIN) 5-325 MG tablet; Take no more than once a day for moderate to severe pain. -     HYDROcodone-acetaminophen (NORCO/VICODIN) 5-325 MG tablet; Take one tablet twice daily PRN for moderate to severe pain.  Chronic pain syndrome -     Discontinue: HYDROcodone-acetaminophen (NORCO/VICODIN) 5-325 MG tablet; Take no more than once a day for moderate to severe pain. -     DRUG MONITORING, PANEL 6 WITH CONFIRMATION, URINE -     HYDROcodone-acetaminophen (NORCO/VICODIN) 5-325 MG tablet; Take one tablet twice daily PRN for moderate to severe pain.  Fibromyalgia -     Discontinue: HYDROcodone-acetaminophen (NORCO/VICODIN) 5-325 MG tablet; Take no more than once a day for moderate to severe pain. -     DRUG MONITORING, PANEL 6 WITH CONFIRMATION, URINE -     HYDROcodone-acetaminophen (NORCO/VICODIN) 5-325 MG tablet; Take one tablet twice daily PRN for moderate to severe pain.  Hypothyroidism, unspecified type -     TSH  Tachycardia  Other orders -      DM TEMPLATE   No protein in urine.  Will get labs to look at GFR and liver and TSH. Likely swelling is chronic venous stasis.  Use compression stockings.  Limit salt.   Needs labs.   Increased norco to bid for overall pain.  Drug screen ordered via urine.  Pain contract revised.  Marland KitchenMarland KitchenPDMP reviewed during this encounter.   Spent 30 minutes with patient discussing plan.  Follow up in 2 weeks.

## 2020-07-19 LAB — COMPLETE METABOLIC PANEL WITH GFR
AG Ratio: 1.7 (calc) (ref 1.0–2.5)
ALT: 28 U/L (ref 6–29)
AST: 20 U/L (ref 10–35)
Albumin: 4.4 g/dL (ref 3.6–5.1)
Alkaline phosphatase (APISO): 75 U/L (ref 37–153)
BUN: 18 mg/dL (ref 7–25)
CO2: 21 mmol/L (ref 20–32)
Calcium: 9.7 mg/dL (ref 8.6–10.4)
Chloride: 107 mmol/L (ref 98–110)
Creat: 0.75 mg/dL (ref 0.50–0.99)
GFR, Est African American: 100 mL/min/{1.73_m2} (ref >=60)
GFR, Est Non African American: 87 mL/min/{1.73_m2} (ref >=60)
Globulin: 2.6 g/dL (calc) (ref 1.9–3.7)
Glucose, Bld: 120 mg/dL (ref 65–139)
Potassium: 3.5 mmol/L (ref 3.5–5.3)
Sodium: 139 mmol/L (ref 135–146)
Total Bilirubin: 0.4 mg/dL (ref 0.2–1.2)
Total Protein: 7 g/dL (ref 6.1–8.1)

## 2020-07-19 LAB — TSH: TSH: 0.19 mIU/L — ABNORMAL LOW (ref 0.40–4.50)

## 2020-07-19 NOTE — Progress Notes (Signed)
Recheck in 6 weeks TSH.

## 2020-07-19 NOTE — Progress Notes (Signed)
Confirm exactly how patient is taking synthroid. You are showing to be HYPER thyroid at this time and dose needs to be decreased.

## 2020-07-19 NOTE — Progress Notes (Signed)
Change to 150mg  daily except 1.5 tablet once a week.

## 2020-07-20 ENCOUNTER — Other Ambulatory Visit: Payer: Self-pay | Admitting: Neurology

## 2020-07-20 DIAGNOSIS — E039 Hypothyroidism, unspecified: Secondary | ICD-10-CM

## 2020-07-20 LAB — DRUG MONITORING, PANEL 6 WITH CONFIRMATION, URINE
6 Acetylmorphine: NEGATIVE ng/mL (ref <10)
Alcohol Metabolites: POSITIVE ng/mL — AB
Amphetamines: NEGATIVE ng/mL (ref <500)
Barbiturates: NEGATIVE ng/mL (ref <300)
Benzodiazepines: NEGATIVE ng/mL (ref <100)
Cocaine Metabolite: NEGATIVE ng/mL (ref <150)
Creatinine: 43.4 mg/dL
Ethyl Glucuronide (ETG): 2578 ng/mL — ABNORMAL HIGH (ref <500)
Ethyl Sulfate (ETS): 1157 ng/mL — ABNORMAL HIGH (ref <100)
Marijuana Metabolite: NEGATIVE ng/mL (ref <20)
Methadone Metabolite: NEGATIVE ng/mL (ref <100)
Opiates: NEGATIVE ng/mL (ref <100)
Oxidant: NEGATIVE ug/mL
Oxycodone: NEGATIVE ng/mL (ref <100)
Phencyclidine: NEGATIVE ng/mL (ref <25)
pH: 5.1 (ref 4.5–9.0)

## 2020-07-20 LAB — DM TEMPLATE

## 2020-07-21 ENCOUNTER — Telehealth: Payer: Medicare Other | Admitting: Physician Assistant

## 2020-07-21 NOTE — Progress Notes (Signed)
Pt did warn me about some beer she drank. On chronic pain contract you will not be able to drink alcohol. You just started contract so at this point if you want pain medication need to not drink. If we have another positive alcohol we can't give pain medication anymore.

## 2020-07-24 ENCOUNTER — Encounter: Payer: Self-pay | Admitting: Physician Assistant

## 2020-08-05 ENCOUNTER — Other Ambulatory Visit: Payer: Self-pay | Admitting: Physician Assistant

## 2020-08-05 DIAGNOSIS — I10 Essential (primary) hypertension: Secondary | ICD-10-CM

## 2020-08-10 ENCOUNTER — Other Ambulatory Visit: Payer: Self-pay | Admitting: Physician Assistant

## 2020-08-10 DIAGNOSIS — M62830 Muscle spasm of back: Secondary | ICD-10-CM

## 2020-08-10 DIAGNOSIS — G8929 Other chronic pain: Secondary | ICD-10-CM

## 2020-08-11 ENCOUNTER — Other Ambulatory Visit: Payer: Self-pay | Admitting: Physician Assistant

## 2020-08-11 DIAGNOSIS — K2100 Gastro-esophageal reflux disease with esophagitis, without bleeding: Secondary | ICD-10-CM

## 2020-08-16 ENCOUNTER — Other Ambulatory Visit: Payer: Self-pay | Admitting: Physician Assistant

## 2020-08-16 DIAGNOSIS — F410 Panic disorder [episodic paroxysmal anxiety] without agoraphobia: Secondary | ICD-10-CM

## 2020-08-16 NOTE — Telephone Encounter (Signed)
Last written 06/01/2020 #30 no refills Last appt 07/18/2020

## 2020-08-18 ENCOUNTER — Other Ambulatory Visit: Payer: Self-pay

## 2020-08-18 DIAGNOSIS — M5137 Other intervertebral disc degeneration, lumbosacral region: Secondary | ICD-10-CM

## 2020-08-18 DIAGNOSIS — M797 Fibromyalgia: Secondary | ICD-10-CM

## 2020-08-18 DIAGNOSIS — G8929 Other chronic pain: Secondary | ICD-10-CM

## 2020-08-18 DIAGNOSIS — I10 Essential (primary) hypertension: Secondary | ICD-10-CM

## 2020-08-18 DIAGNOSIS — G894 Chronic pain syndrome: Secondary | ICD-10-CM

## 2020-08-18 NOTE — Telephone Encounter (Signed)
Aimee Little is requesting a refill on Hydrocodone.

## 2020-08-18 NOTE — Telephone Encounter (Signed)
This is med is hydrochlorothiazide she doe snot need this right?

## 2020-08-21 MED ORDER — HYDROCODONE-ACETAMINOPHEN 5-325 MG PO TABS: ORAL_TABLET | ORAL | 0 refills | Status: DC

## 2020-08-21 NOTE — Telephone Encounter (Signed)
She needs hydrocodone

## 2020-08-23 ENCOUNTER — Telehealth (INDEPENDENT_AMBULATORY_CARE_PROVIDER_SITE_OTHER): Payer: Medicare Other | Admitting: Medical-Surgical

## 2020-08-23 ENCOUNTER — Other Ambulatory Visit: Payer: Self-pay | Admitting: Physician Assistant

## 2020-08-23 DIAGNOSIS — M5442 Lumbago with sciatica, left side: Secondary | ICD-10-CM | POA: Diagnosis not present

## 2020-08-23 DIAGNOSIS — G8929 Other chronic pain: Secondary | ICD-10-CM

## 2020-08-23 DIAGNOSIS — M5441 Lumbago with sciatica, right side: Secondary | ICD-10-CM | POA: Diagnosis not present

## 2020-08-23 MED ORDER — PREDNISONE 50 MG PO TABS: 50.0000 mg | ORAL_TABLET | Freq: Every day | ORAL | 0 refills | Status: DC

## 2020-08-23 NOTE — Telephone Encounter (Signed)
Can we get her on for a visit (even if it's virtual) this afternoon? She takes a ton of medications and we need to get some details about her flare.

## 2020-08-23 NOTE — Telephone Encounter (Signed)
Aimee Little states she has a flare up of lower back and buttock pain.

## 2020-08-23 NOTE — Progress Notes (Signed)
Virtual Visit via Video Note  I connected with Aimee Little on 08/23/20 at  2:40 PM EST by a video enabled telemedicine application and verified that I am speaking with the correct person using two identifiers.   I discussed the limitations of evaluation and management by telemedicine and the availability of in person appointments. The patient expressed understanding and agreed to proceed.  Patient location: home Provider locations: office  Subjective:    CC: sciatica flare  HPI: Pleasant 59 year old female presenting via MyChart video visit for complaints of severe low back pain with bilateral sciatica. Normally has constant low back pain but sciatica is intermittent. Over the last few days, she has been on her feet, walking and cooking for the holiday. Notes that her back pain has flared and she is experiencing severe pain to the bilateral hips and thighs. Taking flexeril, Norco, and Meloxicam. Has also tried heat, ice, and her TENS unit. Measures so far have been unhelpful.  Denies numbness, tingling, muscle weakness, saddle paresthesias, and new onset incontinence.  Past medical history, Surgical history, Family history not pertinant except as noted below, Social history, Allergies, and medications have been entered into the medical record, reviewed, and corrections made.   Review of Systems: See HPI for pertinent positives and negatives.   Objective:    General: Speaking clearly in complete sentences without any shortness of breath.  Alert and oriented x3.  Normal judgment. No apparent acute distress.  Impression and Recommendations:    1. Chronic bilateral low back pain with bilateral sciatica Continue current measures including Flexeril, meloxicam, and hydrocodone.  Continue using TENS unit, heat/ice, and/or massage.  Sending in prednisone 50 mg daily x5 days.  If this is unhelpful by the end of her therapy, she will need to follow-up with her PCP for further evaluation.  I  discussed the assessment and treatment plan with the patient. The patient was provided an opportunity to ask questions and all were answered. The patient agreed with the plan and demonstrated an understanding of the instructions.   The patient was advised to call back or seek an in-person evaluation if the symptoms worsen or if the condition fails to improve as anticipated.  20 minutes of non-face-to-face time was provided during this encounter.  Return if symptoms worsen or fail to improve.  Clearnce Sorrel, DNP, APRN, FNP-BC [facility]

## 2020-08-23 NOTE — Telephone Encounter (Signed)
Patient scheduled.

## 2020-08-25 ENCOUNTER — Other Ambulatory Visit: Payer: Self-pay | Admitting: Physician Assistant

## 2020-08-25 DIAGNOSIS — G894 Chronic pain syndrome: Secondary | ICD-10-CM

## 2020-08-25 DIAGNOSIS — M797 Fibromyalgia: Secondary | ICD-10-CM

## 2020-08-25 DIAGNOSIS — M5137 Other intervertebral disc degeneration, lumbosacral region: Secondary | ICD-10-CM

## 2020-08-25 DIAGNOSIS — G43909 Migraine, unspecified, not intractable, without status migrainosus: Secondary | ICD-10-CM

## 2020-08-25 DIAGNOSIS — M545 Low back pain, unspecified: Secondary | ICD-10-CM

## 2020-08-28 ENCOUNTER — Telehealth: Payer: Medicare Other | Admitting: Medical-Surgical

## 2020-09-01 ENCOUNTER — Other Ambulatory Visit: Payer: Self-pay | Admitting: Family Medicine

## 2020-09-01 ENCOUNTER — Other Ambulatory Visit: Payer: Self-pay | Admitting: Physician Assistant

## 2020-09-01 ENCOUNTER — Ambulatory Visit (INDEPENDENT_AMBULATORY_CARE_PROVIDER_SITE_OTHER): Payer: Medicare Other | Admitting: Nurse Practitioner

## 2020-09-01 ENCOUNTER — Encounter: Payer: Self-pay | Admitting: Nurse Practitioner

## 2020-09-01 ENCOUNTER — Other Ambulatory Visit: Payer: Self-pay

## 2020-09-01 VITALS — BP 122/74 | HR 110 | Temp 98.7°F | Ht 69.0 in | Wt 225.8 lb

## 2020-09-01 DIAGNOSIS — G2581 Restless legs syndrome: Secondary | ICD-10-CM

## 2020-09-01 DIAGNOSIS — R079 Chest pain, unspecified: Secondary | ICD-10-CM

## 2020-09-01 DIAGNOSIS — R600 Localized edema: Secondary | ICD-10-CM | POA: Insufficient documentation

## 2020-09-01 DIAGNOSIS — M545 Low back pain, unspecified: Secondary | ICD-10-CM

## 2020-09-01 DIAGNOSIS — R1013 Epigastric pain: Secondary | ICD-10-CM | POA: Insufficient documentation

## 2020-09-01 DIAGNOSIS — G894 Chronic pain syndrome: Secondary | ICD-10-CM

## 2020-09-01 DIAGNOSIS — E039 Hypothyroidism, unspecified: Secondary | ICD-10-CM | POA: Diagnosis not present

## 2020-09-01 DIAGNOSIS — M5137 Other intervertebral disc degeneration, lumbosacral region: Secondary | ICD-10-CM

## 2020-09-01 DIAGNOSIS — M797 Fibromyalgia: Secondary | ICD-10-CM

## 2020-09-01 MED ORDER — OMEPRAZOLE 40 MG PO CPDR: 40.0000 mg | DELAYED_RELEASE_CAPSULE | Freq: Two times a day (BID) | ORAL | 3 refills | Status: DC

## 2020-09-01 NOTE — Assessment & Plan Note (Signed)
Epigastric pain- suspicious for exacerbation of GERD or possible gallbladder involvement.  See Chest Pain note for further information.  Discontinued dexilant and start prilosec 40mg  BID for 2 weeks then taper to once a day to see if this helps with GERD symptoms.  Plan to follow-up if symptoms worsen or fail to improve.

## 2020-09-01 NOTE — Progress Notes (Signed)
Acute Office Visit  Subjective:    Patient ID: Aimee Little, female    DOB: 1961/05/09, 59 y.o.   MRN: [id number]  Chief Complaint  Patient presents with  . Chest Pain    radiates through to her back, one episode last night  . Leg Swelling    bilate LEE over holidays  . Numbness    bilat hand    HPI Patient is in today for sudden onset of epigastric/chest pain that occurred last night after eating a chicken salad sandwich. She reports sudden onset of 10/10 sharp pain "under the breasts" that lasted for approximately 2-3 hours. She reports the pain did radiate into the right side of her mid back.   She took her dexilant, and then a pepcid AC. She reports neither helped with the pain. She did eventually take a hydrocodone that she has for chronic back pain and she reports the pain stopped. It has not returned since that time.   She denies nausea, dizziness, lightheadedness, shortness of breath, neck pain, shoulder pain, arm pain, diaphoresis, diarrhea, chills, heart burn/reflux symptoms, gas, or palpitations before, during, or after the event.   She tells me that this has happened once before and it resolved without intervention. This time it lasted longer and she was concerned that it may be her heart. It has not happened again since last night.   She does tell me her dexilant is not working as she feels it should and she would like to switch to prilosec. She tried one of her sisters prilosec and it worked well for her GERD symptoms over thanksgiving.   She also tells me she is trying to loose weight and is requesting a diet plan that may be helpful.     She also tells me that she has had lower extremity edema ongoing for years when she is on her feet for an extended period of time. She tells me over Thanksgiving she had significant swelling in both of her legs that took a few days to resolve.   Past Medical History:  Diagnosis Date  . DDD (degenerative disc disease), lumbar   .  Depression   . Fibromyalgia   . Insomnia   . Migraine   . Thyroid disease   . Tobacco use     Past Surgical History:  Procedure Laterality Date  . ABDOMINAL HYSTERECTOMY     took uterus and both ovaries left    Family History  Problem Relation Age of Onset  . Depression Mother   . Hyperlipidemia Mother   . Hypertension Mother   . Heart attack Father   . Hypertension Sister   . Diabetes Sister   . Alcohol abuse Brother   . Hypertension Brother     Social History   Socioeconomic History  . Marital status: Married    Spouse name: Not on file  . Number of children: Not on file  . Years of education: Not on file  . Highest education level: Not on file  Occupational History  . Not on file  Tobacco Use  . Smoking status: Light Tobacco Smoker    Packs/day: 0.25    Last attempt to quit: 10/02/2013    Years since quitting: 6.9  . Smokeless tobacco: Never Used  Substance and Sexual Activity  . Alcohol use: No  . Drug use: No  . Sexual activity: Never  Other Topics Concern  . Not on file  Social History Narrative  . Not on file  Social Determinants of Health   Financial Resource Strain:   . Difficulty of Paying Living Expenses: Not on file  Food Insecurity:   . Worried About Charity fundraiser in the Last Year: Not on file  . Ran Out of Food in the Last Year: Not on file  Transportation Needs:   . Lack of Transportation (Medical): Not on file  . Lack of Transportation (Non-Medical): Not on file  Physical Activity:   . Days of Exercise per Week: Not on file  . Minutes of Exercise per Session: Not on file  Stress:   . Feeling of Stress : Not on file  Social Connections:   . Frequency of Communication with Friends and Family: Not on file  . Frequency of Social Gatherings with Friends and Family: Not on file  . Attends Religious Services: Not on file  . Active Member of Clubs or Organizations: Not on file  . Attends Archivist Meetings: Not on file  .  Marital Status: Not on file  Intimate Partner Violence:   . Fear of Current or Ex-Partner: Not on file  . Emotionally Abused: Not on file  . Physically Abused: Not on file  . Sexually Abused: Not on file    Outpatient Medications Prior to Visit  Medication Sig Dispense Refill  . ALPRAZolam (XANAX) 0.5 MG tablet Take 1 tablet (0.5 mg total) by mouth 2 (two) times daily as needed for anxiety. 30 tablet 1  . AMBULATORY NON FORMULARY MEDICATION Tens unit for lumbar spine for low back pain, muscle spasm, lumbar DDD. 1 Device 0  . amLODipine (NORVASC) 10 MG tablet Take 1 tablet (10 mg total) by mouth daily. 90 tablet 1  . Armodafinil 150 MG tablet Take 1 tablet (150 mg total) by mouth daily. 30 tablet 2  . busPIRone (BUSPAR) 15 MG tablet TAKE ONE TABLET BY MOUTH 2 TIMES A DAY 180 tablet 1  . CALCIUM-MAGNESIUM-ZINC PO Take by mouth daily.    . cholecalciferol (VITAMIN D3) 25 MCG (1000 UT) tablet Take 1,000 Units by mouth daily.    . citalopram (CELEXA) 40 MG tablet Take 1 tablet (40 mg total) by mouth daily. 90 tablet 1  . cyclobenzaprine (FLEXERIL) 10 MG tablet Take 1 tablet (10 mg total) by mouth 3 (three) times daily as needed for muscle spasms. 180 tablet 1  . diclofenac sodium (VOLTAREN) 1 % GEL Apply 4 g topically 4 (four) times daily. To affected joint. (Patient taking differently: Apply 4 g topically 4 (four) times daily as needed. To affected joint.) 100 g 11  . famotidine (PEPCID) 20 MG tablet Take 1 tablet (20 mg total) by mouth 2 (two) times daily. 180 tablet 1  . hydrochlorothiazide (HYDRODIURIL) 25 MG tablet Take 1 tablet (25 mg total) by mouth daily. 90 tablet 1  . HYDROcodone-acetaminophen (NORCO/VICODIN) 5-325 MG tablet Take one tablet twice daily PRN for moderate to severe pain. 60 tablet 0  . levothyroxine (SYNTHROID) 150 MCG tablet Take 1 tablet (150 mcg total) by mouth daily. Take 1 tablet (150 mcg) by mouth Sun, Mon, Wed, Fri, Sat. Take 1.5 tablet (225 mcg) Tuesday and Thursday.  Take on empty stomach at least 30 minutes prior to food or other medications. (Patient taking differently: Take 1 tablet (150 mcg) by mouth Sun, Mon, Wed, Fri, Sat. Take 1.5 tablet (225 mcg) Tuesday and Thursday. Take on empty stomach at least 30 minutes prior to food or other medications.) 108 tablet 1  . meloxicam (MOBIC) 15 MG  tablet Take 1 tablet (15 mg total) by mouth daily. 90 tablet 1  . metoCLOPramide (REGLAN) 10 MG tablet Take 1 tablet (10 mg total) by mouth 4 (four) times daily - before meals and at bedtime. 120 tablet 0  . Misc. Devices MISC Initiate Bipap 8/4 cm to 16/12cm water pressure. New mask and supplies through DME [facility].    . Omega 3-6-9 Fatty Acids (OMEGA 3-6-9 COMPLEX PO) Take 1 capsule by mouth daily.     . predniSONE (DELTASONE) 50 MG tablet Take 1 tablet (50 mg total) by mouth daily. 5 tablet 0  . rizatriptan (MAXALT-MLT) 10 MG disintegrating tablet May repeat in 2 hours if needed for migraine 30 tablet 1  . rOPINIRole (REQUIP) 1 MG tablet TAKE ONE TABLET BY MOUTH 3 TIMES A DAY. 270 tablet 1  . topiramate (TOPAMAX) 100 MG tablet Take one tablet twice a day. 180 tablet 1  . traZODone (DESYREL) 100 MG tablet Take one tablet before bed. (Patient taking differently: Take 100 mg by mouth at bedtime as needed. ) 90 tablet 1  . valACYclovir (VALTREX) 1000 MG tablet TAKE TWO TABLETS 2 TIMES A DAY AT ONSET OF COLD SORE FOR 2 DAYS 60 tablet 0  . DEXILANT 60 MG capsule Take 1 capsule (60 mg total) by mouth daily. 90 capsule 1  . gabapentin (NEURONTIN) 300 MG capsule TAKE 1 CAPSULES BY MOUTH 3 TIMES A DAY AS NEEDED FOR PAIN/NEUROPATHY 270 capsule 4   No facility-administered medications prior to visit.    Allergies  Allergen Reactions  . Doxepin Other (See Comments)  . Pristiq [Desvenlafaxine Succinate Er]     Depression worse.  . Estradiol Other (See Comments)    Burning   . Gabapentin     Sleep walking  . Levothyroxine     Other reaction(s): Other (See Comments) Reaction  unknown  . Lexapro [Escitalopram Oxalate]     jittery  . Lisinopril     cough  . Losartan     itching  . Mirapex [Pramipexole Dihydrochloride]     Nausea and vomiting  . Phentermine Diarrhea    Stomach ache/increased anxiety.   . Pregabalin   . Pristiq [Desvenlafaxine] Other (See Comments)    Increased depression  . Seroquel [Quetiapine Fumarate]     hallicinations  . Trintellix [Vortioxetine]     RLS/increased anxiety    Review of Systems All review of systems negative except what is listed in the HPI     Objective:    Physical Exam Vitals and nursing note reviewed.  Constitutional:      Appearance: She is obese. She is not ill-appearing.  HENT:     Head: Normocephalic.  Neck:     Thyroid: No thyromegaly.     Vascular: No JVD.  Cardiovascular:     Rate and Rhythm: Normal rate and regular rhythm.  No extrasystoles are present.    Chest Wall: PMI is not displaced.     Pulses:          Carotid pulses are 2+ on the right side and 2+ on the left side.      Radial pulses are 2+ on the right side and 2+ on the left side.       Dorsalis pedis pulses are 2+ on the right side and 2+ on the left side.     Heart sounds: Normal heart sounds. No murmur heard.   Pulmonary:     Effort: Pulmonary effort is normal. No accessory muscle usage.  Breath sounds: Normal breath sounds.  Chest:     Chest wall: No deformity, tenderness or crepitus. There is no dullness to percussion.  Abdominal:     General: Bowel sounds are normal. There is no abdominal bruit.     Palpations: Abdomen is soft. There is no hepatomegaly, splenomegaly or mass.     Tenderness: There is no abdominal tenderness. There is no guarding or rebound.  Musculoskeletal:     Cervical back: Normal range of motion.     Right lower leg: No tenderness. Edema present.     Left lower leg: No tenderness. Edema present.  Skin:    General: Skin is warm and dry.     Capillary Refill: Capillary refill takes less than 2  seconds.  Neurological:     General: No focal deficit present.     Mental Status: She is alert and oriented to person, place, and time.  Psychiatric:        Mood and Affect: Mood normal.        Behavior: Behavior normal.     BP 122/74   Pulse (!) 110   Temp 98.7 F (37.1 C)   Ht 5\' 9"  (1.753 m)   Wt 225 lb 12.8 oz (102.4 kg)   SpO2 98%   BMI 33.34 kg/m  Wt Readings from Last 3 Encounters:  09/01/20 225 lb 12.8 oz (102.4 kg)  07/18/20 220 lb (99.8 kg)  06/01/20 212 lb (96.2 kg)    There are no preventive care reminders to display for this patient.  There are no preventive care reminders to display for this patient.   Lab Results  Component Value Date   TSH 0.19 (L) 07/18/2020   Lab Results  Component Value Date   WBC 8.7 01/19/2020   HGB 16.2 (H) 01/19/2020   HCT 47.3 (H) 01/19/2020   MCV 97.9 01/19/2020   PLT 257 01/19/2020   Lab Results  Component Value Date   NA 139 07/18/2020   K 3.5 07/18/2020   CO2 21 07/18/2020   GLUCOSE 120 07/18/2020   BUN 18 07/18/2020   CREATININE 0.75 07/18/2020   BILITOT 0.4 07/18/2020   ALKPHOS 78 12/06/2016   AST 20 07/18/2020   ALT 28 07/18/2020   PROT 7.0 07/18/2020   ALBUMIN 3.9 12/06/2016   CALCIUM 9.7 07/18/2020   Lab Results  Component Value Date   CHOL 225 (H) 01/19/2020   Lab Results  Component Value Date   HDL 58 01/19/2020   Lab Results  Component Value Date   LDLCALC 142 (H) 01/19/2020   Lab Results  Component Value Date   TRIG 127 01/19/2020   Lab Results  Component Value Date   CHOLHDL 3.9 01/19/2020   No results found for: HGBA1C     Assessment & Plan:   Problem List Items Addressed This Visit      Endocrine   Thyroid activity decreased    Orders placed for repeat TSH. It has been 6 weeks since dose increase- will obtain labs today since getting other labs.       Relevant Orders   TSH     Other   Chest pain - Primary    Pain in the epigastric region with sudden onset last night  resolving after 2-3 hours with norco.  Pain does not appear to be cardiac in nature based on patients description and presentation.  EKG obtained today and reviewed with no indication of ST elevation or depression in any leads. NSR present.  QTc is noted to be prolonged at .494s.  Given patients descriptor of pain, it appears to possibly be GI related. Will obtain labs to evaluate liver, gallbladder, and pancreas function.  Consider RUQ ultrasound if labs show abnormality for further evaluation.  Discussed emergency warning signs with patient that would warrant immediate evaluation in the ED.  Encouraged patient to follow bland diet low in fat, cholesterol, and sodium and to monitor for symptoms after eating.  Will make changes to the plan of care based on lab results.       Relevant Orders   EKG 12-Lead   COMPLETE METABOLIC PANEL WITH GFR   Lipase   CBC with Differential/Platelet   Epigastric pain    Epigastric pain- suspicious for exacerbation of GERD or possible gallbladder involvement.  See Chest Pain note for further information.  Discontinued dexilant and start prilosec 40mg  BID for 2 weeks then taper to once a day to see if this helps with GERD symptoms.  Plan to follow-up if symptoms worsen or fail to improve.       Relevant Medications   omeprazole (PRILOSEC) 40 MG capsule   Other Relevant Orders   COMPLETE METABOLIC PANEL WITH GFR   Lipase   CBC with Differential/Platelet   Bilateral lower extremity edema    Edema consistent with fluid retention and venous insufficiency.  Discussed with patient the importance of ensuring that she stays well hydrated and follows a low sodium diet.  Recommend elevation of lower extremities while sitting and to take frequent breaks and prop feet up above the heart to prevent edema from forming.  Recommend compression stockings if the above is not possible or effective in prevention of edema.  No signs of heart failure or fluid overload at this  time, but may consider BNP or further evaluation if conservative measures are ineffective.  Follow-up if symptoms worsen or fail to improve.           Meds ordered this encounter  Medications  . omeprazole (PRILOSEC) 40 MG capsule    Sig: Take 1 capsule (40 mg total) by mouth in the morning and at bedtime. Can go down to one a day after 2 weeks.    Dispense:  180 capsule    Refill:  3     Orma Render, NP

## 2020-09-01 NOTE — Patient Instructions (Signed)
Food Choices for Gastroesophageal Reflux Disease, Adult When you have gastroesophageal reflux disease (GERD), the foods you eat and your eating habits are very important. Choosing the right foods can help ease your discomfort. Think about working with a nutrition specialist (dietitian) to help you make good choices. What are tips for following this plan?  Meals  Choose healthy foods that are low in fat, such as fruits, vegetables, whole grains, low-fat dairy products, and lean meat, fish, and poultry.  Eat small meals often instead of 3 large meals a day. Eat your meals slowly, and in a place where you are relaxed. Avoid bending over or lying down until 2-3 hours after eating.  Avoid eating meals 2-3 hours before bed.  Avoid drinking a lot of liquid with meals.  Cook foods using methods other than frying. Bake, grill, or broil food instead.  Avoid or limit: ? Chocolate. ? Peppermint or spearmint. ? Alcohol. ? Pepper. ? Black and decaffeinated coffee. ? Black and decaffeinated tea. ? Bubbly (carbonated) soft drinks. ? Caffeinated energy drinks and soft drinks.  Limit high-fat foods such as: ? Fatty meat or fried foods. ? Whole milk, cream, butter, or ice cream. ? Nuts and nut butters. ? Pastries, donuts, and sweets made with butter or shortening.  Avoid foods that cause symptoms. These foods may be different for everyone. Common foods that cause symptoms include: ? Tomatoes. ? Oranges, lemons, and limes. ? Peppers. ? Spicy food. ? Onions and garlic. ? Vinegar. Lifestyle  Maintain a healthy weight. Ask your doctor what weight is healthy for you. If you need to lose weight, work with your doctor to do so safely.  Exercise for at least 30 minutes for 5 or more days each week, or as told by your doctor.  Wear loose-fitting clothes.  Do not smoke. If you need help quitting, ask your doctor.  Sleep with the head of your bed higher than your feet. Use a wedge under the  mattress or blocks under the bed frame to raise the head of the bed. Summary  When you have gastroesophageal reflux disease (GERD), food and lifestyle choices are very important in easing your symptoms.  Eat small meals often instead of 3 large meals a day. Eat your meals slowly, and in a place where you are relaxed.  Limit high-fat foods such as fatty meat or fried foods.  Avoid bending over or lying down until 2-3 hours after eating.  Avoid peppermint and spearmint, caffeine, alcohol, and chocolate. This information is not intended to replace advice given to you by your health care provider. Make sure you discuss any questions you have with your health care provider. Document Revised: 01/07/2019 Document Reviewed: 10/22/2016 Elsevier Patient Education  [place].   Gallbladder Eating Plan If you have a gallbladder condition, you may have trouble digesting fats. Eating a low-fat diet can help reduce your symptoms, and may be helpful before and after having surgery to remove your gallbladder (cholecystectomy). Your health care provider may recommend that you work with a diet and nutrition specialist (dietitian) to help you reduce the amount of fat in your diet. What are tips for following this plan? General guidelines Limit your fat intake to less than 30% of your total daily calories. If you eat around 1,800 calories each day, this is less than 60 grams (g) of fat per day. Fat is an important part of a healthy diet. Eating a low-fat diet can make it hard to maintain a healthy body  weight. Ask your dietitian how much fat, calories, and other nutrients you need each day. Eat small, frequent meals throughout the day instead of three large meals. Drink at least 8-10 cups of fluid a day. Drink enough fluid to keep your urine clear or pale yellow. Limit alcohol intake to no more than 1 drink a day for nonpregnant women and 2 drinks a day for men. One drink equals 12 oz of beer, 5 oz of  wine, or 1 oz of hard liquor. Reading food labels Check Nutrition Facts on food labels for the amount of fat per serving. Choose foods with less than 3 grams of fat per serving. Shopping Choose nonfat and low-fat healthy foods. Look for the words "nonfat," "low fat," or "fat free." Avoid buying processed or prepackaged foods. Cooking Cook using low-fat methods, such as baking, broiling, grilling, or boiling. Cook with small amounts of healthy fats, such as olive oil, grapeseed oil, canola oil, or sunflower oil. What foods are recommended?  All fresh, frozen, or canned fruits and vegetables. Whole grains. Low-fat or non-fat (skim) milk and yogurt. Lean meat, skinless poultry, fish, eggs, and beans. Low-fat protein supplement powders or drinks. Spices and herbs. What foods are not recommended? High-fat foods. These include baked goods, fast food, fatty cuts of meat, ice cream, french toast, sweet rolls, pizza, cheese bread, foods covered with butter, creamy sauces, or cheese. Fried foods. These include french fries, tempura, battered fish, breaded chicken, fried breads, and sweets. Foods with strong odors. Foods that cause bloating and gas. Summary A low-fat diet can be helpful if you have a gallbladder condition, or before and after gallbladder surgery. Limit your fat intake to less than 30% of your total daily calories. This is about 60 g of fat if you eat 1,800 calories each day. Eat small, frequent meals throughout the day instead of three large meals. This information is not intended to replace advice given to you by your health care provider. Make sure you discuss any questions you have with your health care provider. Document Revised: 01/07/2019 Document Reviewed: 10/24/2016 Elsevier Patient Education  [place].

## 2020-09-01 NOTE — Assessment & Plan Note (Addendum)
Pain in the epigastric region with sudden onset last night resolving after 2-3 hours with norco.  Pain does not appear to be cardiac in nature based on patients description and presentation.  Given patients descriptor of pain, it appears to possibly be GI related. Will obtain labs to evaluate liver, gallbladder, and pancreas function.  Consider RUQ ultrasound if labs show abnormality for further evaluation.  Discussed emergency warning signs with patient that would warrant immediate evaluation in the ED.  Encouraged patient to follow bland diet low in fat, cholesterol, and sodium and to monitor for symptoms after eating.  Will make changes to the plan of care based on lab results.   EKG:  No indication of ST elevation or depression in any leads. NSR present. QTc is noted to be mildly prolonged at .494s. Tall, broad, and notched T waves noted in II, III, and aVF with biphasic appearance in V1 and V2 suspicious of bi-atrial enlargement. Isoelectric line trending down at onset of P wave giving slightly elevated appearance in Inferior leads- most likely due to prolonged QT.  Given patients lower extremity edema, will plan for cardiology referral for further evaluation.

## 2020-09-01 NOTE — Telephone Encounter (Signed)
Please advise regarding Gabapentin refill.

## 2020-09-01 NOTE — Assessment & Plan Note (Signed)
Orders placed for repeat TSH. It has been 6 weeks since dose increase- will obtain labs today since getting other labs.

## 2020-09-01 NOTE — Assessment & Plan Note (Signed)
Edema consistent with fluid retention and venous insufficiency.  Discussed with patient the importance of ensuring that she stays well hydrated and follows a low sodium diet.  Recommend elevation of lower extremities while sitting and to take frequent breaks and prop feet up above the heart to prevent edema from forming.  Recommend compression stockings if the above is not possible or effective in prevention of edema.  No signs of heart failure or fluid overload at this time, but may consider BNP or further evaluation if conservative measures are ineffective.  Follow-up if symptoms worsen or fail to improve.

## 2020-09-02 LAB — CBC WITH DIFFERENTIAL/PLATELET
Absolute Monocytes: 880 cells/uL (ref 200–950)
Basophils Absolute: 55 cells/uL (ref 0–200)
Basophils Relative: 0.5 %
Eosinophils Absolute: 132 cells/uL (ref 15–500)
Eosinophils Relative: 1.2 %
HCT: 50.3 % — ABNORMAL HIGH (ref 35.0–45.0)
Hemoglobin: 17.4 g/dL — ABNORMAL HIGH (ref 11.7–15.5)
Lymphs Abs: 3223 cells/uL (ref 850–3900)
MCH: 32.5 pg (ref 27.0–33.0)
MCHC: 34.6 g/dL (ref 32.0–36.0)
MCV: 94 fL (ref 80.0–100.0)
MPV: 11.1 fL (ref 7.5–12.5)
Monocytes Relative: 8 %
Neutro Abs: 6710 cells/uL (ref 1500–7800)
Neutrophils Relative %: 61 %
Platelets: 325 10*3/uL (ref 140–400)
RBC: 5.35 10*6/uL — ABNORMAL HIGH (ref 3.80–5.10)
RDW: 12.5 % (ref 11.0–15.0)
Total Lymphocyte: 29.3 %
WBC: 11 10*3/uL — ABNORMAL HIGH (ref 3.8–10.8)

## 2020-09-02 LAB — COMPLETE METABOLIC PANEL WITH GFR
AG Ratio: 1.7 (calc) (ref 1.0–2.5)
ALT: 27 U/L (ref 6–29)
AST: 16 U/L (ref 10–35)
Albumin: 4.6 g/dL (ref 3.6–5.1)
Alkaline phosphatase (APISO): 73 U/L (ref 37–153)
BUN/Creatinine Ratio: 22 (calc) (ref 6–22)
BUN: 22 mg/dL (ref 7–25)
CO2: 21 mmol/L (ref 20–32)
Calcium: 10 mg/dL (ref 8.6–10.4)
Chloride: 102 mmol/L (ref 98–110)
Creat: 1.01 mg/dL — ABNORMAL HIGH (ref 0.50–0.99)
GFR, Est African American: 70 mL/min/{1.73_m2} (ref >=60)
GFR, Est Non African American: 60 mL/min/{1.73_m2} (ref >=60)
Globulin: 2.7 g/dL (calc) (ref 1.9–3.7)
Glucose, Bld: 109 mg/dL (ref 65–139)
Potassium: 3.8 mmol/L (ref 3.5–5.3)
Sodium: 138 mmol/L (ref 135–146)
Total Bilirubin: 0.6 mg/dL (ref 0.2–1.2)
Total Protein: 7.3 g/dL (ref 6.1–8.1)

## 2020-09-02 LAB — TSH: TSH: 0.56 mIU/L (ref 0.40–4.50)

## 2020-09-02 LAB — LIPASE: Lipase: 20 U/L (ref 7–60)

## 2020-09-04 NOTE — Progress Notes (Signed)
TSH within normal range. No change to medication at this time.   Liver and pancreatic enzymes look good. No signs that these are cause of pain experienced.   Slight bump in white blood cells, which indicates you could have had a small infection somewhere, but likely not related to the pain you experienced.   Based on the labs, highly likely the pain is from stomach acid production. If this continues we can consider an ultrasound of the abdomen.

## 2020-09-05 ENCOUNTER — Telehealth: Payer: Self-pay | Admitting: Nurse Practitioner

## 2020-09-05 NOTE — Telephone Encounter (Signed)
Patient aware of results and recommendations. She does not have a cardiologist and would like to research and find someone on her own that she would like to be referred to. I instructed her to call back with the info of who she would like to see when she selects someone so that we can get the referral over to that office.   Pt is wanting more info about what the enlarged atria means. Please advise.

## 2020-09-05 NOTE — Telephone Encounter (Signed)
I have reviewed the EKG in full detail and it appears that she may have enlargement of her atria.   I do not feel that this is contributing the symptoms that she had when she came into the office, but would like to have her see cardiology for further evaluation. I feel that their input could be beneficial in her overall health.   Please call and ask the patient if she has a cardiologist and if not if there is one she prefers.  Thank you.

## 2020-09-05 NOTE — Telephone Encounter (Signed)
Atrial enlargement is when the top chambers of the heart are larger than expected. This can be caused by many factors, but most often it is because of increased pressures in the heart, usually due to high blood pressure. We don't know for sure there is any enlargement in her heart, but the EKG does show some signs so we want to have a cardiologist look into this further. The cardiologist will likely want to do a test called an echocardiogram which is an ultrasound that looks directly at the heart and watches it beat, how the blood flows, how the valves close, measures the sizes of all of the chambers, and looks for any abnormalities.   I can make a call in the morning to the patient to help explain.

## 2020-09-06 ENCOUNTER — Other Ambulatory Visit: Payer: Self-pay | Admitting: Nurse Practitioner

## 2020-09-06 ENCOUNTER — Telehealth: Payer: Self-pay | Admitting: Nurse Practitioner

## 2020-09-06 ENCOUNTER — Encounter: Payer: Self-pay | Admitting: Nurse Practitioner

## 2020-09-06 DIAGNOSIS — R9431 Abnormal electrocardiogram [ECG] [EKG]: Secondary | ICD-10-CM

## 2020-09-06 NOTE — Telephone Encounter (Signed)
Referral sent. Thank you!

## 2020-09-06 NOTE — Telephone Encounter (Signed)
Called patient to discuss possible atrial enlargement noted on EKG and confirm referral sent to Dr. Mauricio Po with Cardiology. All questions answered and patient encouraged to reach out with any further questions or concerns.   She endorses that chest pain symptoms have subsided with stopping Dexilant and starting omeprazole for GERD and dietary changes.   She does report that her lower extremity edema is improved and does not typically get bad unless she is on her feet for long periods of time. Discussed recommendation of compression stockings and taking several breaks to prop feet up when she must be on her feet for extended periods.

## 2020-09-06 NOTE — Telephone Encounter (Signed)
Patient called back and would like her referral sent to Dr. Mauricio Po at [facility] cardiology here in [place] if you would put the referral in I will get it sent over. Thank you  Jenny Reichmann

## 2020-09-19 ENCOUNTER — Other Ambulatory Visit: Payer: Self-pay | Admitting: Neurology

## 2020-09-19 DIAGNOSIS — M797 Fibromyalgia: Secondary | ICD-10-CM

## 2020-09-19 DIAGNOSIS — M5137 Other intervertebral disc degeneration, lumbosacral region: Secondary | ICD-10-CM

## 2020-09-19 DIAGNOSIS — G8929 Other chronic pain: Secondary | ICD-10-CM

## 2020-09-19 DIAGNOSIS — G894 Chronic pain syndrome: Secondary | ICD-10-CM

## 2020-09-19 MED ORDER — HYDROCODONE-ACETAMINOPHEN 5-325 MG PO TABS: ORAL_TABLET | ORAL | 0 refills | Status: DC

## 2020-09-19 NOTE — Telephone Encounter (Signed)
Patient called for refill of hydrocodone, needs filled tomorrow.  Last written 08/21/2020 #60 with no refills Last appt 07/18/2020 for pain On contract, UTD. Please advise.

## 2020-09-27 ENCOUNTER — Telehealth: Payer: Self-pay

## 2020-09-27 NOTE — Telephone Encounter (Signed)
Cannot give abx without appt. If started within the last 7 days she needs to continue symptomatic care(flonase/tylenol cold sinus severe/zinc 50mg /vitamin C 500mg ) if ongoing suggest appt can be virtual.

## 2020-09-27 NOTE — Telephone Encounter (Signed)
Pt called stating no COVID exposure.   Having sinus issues. Facial pressure. Dark circles around the eye. No cough or chest congestion. No fever.   Requesting ABX.

## 2020-09-28 ENCOUNTER — Encounter: Payer: Self-pay | Admitting: Nurse Practitioner

## 2020-09-28 ENCOUNTER — Telehealth (INDEPENDENT_AMBULATORY_CARE_PROVIDER_SITE_OTHER): Payer: Medicare Other | Admitting: Nurse Practitioner

## 2020-09-28 DIAGNOSIS — J0140 Acute pansinusitis, unspecified: Secondary | ICD-10-CM | POA: Diagnosis not present

## 2020-09-28 MED ORDER — AMOXICILLIN-POT CLAVULANATE 875-125 MG PO TABS: 1.0000 | ORAL_TABLET | Freq: Two times a day (BID) | ORAL | 0 refills | Status: DC

## 2020-09-28 MED ORDER — FLUTICASONE PROPIONATE 50 MCG/ACT NA SUSP: 2.0000 | Freq: Every day | NASAL | 12 refills | Status: DC

## 2020-09-28 NOTE — Telephone Encounter (Signed)
Patient is scheduled to see SaraBeth virtually today.

## 2020-09-28 NOTE — Progress Notes (Signed)
Virtual Video Visit via MyChart Note- CONVERTED to TELEPHONE  I connected with  Aimee Little on 09/28/20 at  9:50 AM EST by the video enabled telemedicine application for , MyChart, and verified that I am speaking with the correct person using two identifiers.   I introduced myself as a Publishing rights manager with the practice. We discussed the limitations of evaluation and management by telemedicine and the availability of in person appointments. The patient expressed understanding and agreed to proceed.  Participating parties in this visit include: The nurse practitioner and patient listed The patient is: at home I am: in the office  Subjective:    CC:  Chief Complaint  Patient presents with  . Sinusitis    HPI: Aimee Little is a 59 y.o. y/o female presenting via MyChart today for concerns with sinus pain and pressure.  Duration: 5 days Sx present: sinus pain and pressure across the forehead and under her eyes, dark circles under the eyes, headache, nasal congestion, ear pressure.  Negative sx: post nasal drip, cough, sore throat, fever, chills, body aches COVID/Flu exposure: no Treatments attempted: tylenol sinus, humidifier Any testing performed: none COVID vaccinated  Past medical history, Surgical history, Family history not pertinant except as noted below, Social history, Allergies, and medications have been entered into the medical record, reviewed, and corrections made.   Review of Systems:  All review of systems negative except what is listed in the HPI   Objective:    General: Speaking clearly in complete sentences without any shortness of breath.   Audible congestion present.  Alert and oriented x3.   Normal judgment.  No apparent acute distress.   Impression and Recommendations:    1. Acute non-recurrent pansinusitis Symptoms and presentation consistent with sinusitis. Given length of time symptoms present, will begin antibiotic treatment. Recommendations  for OTC supplemental medication provided.  Fluticasone nasal spray for symptom management and may be continued or used at the first sign of symptoms in the future.   - amoxicillin-clavulanate (AUGMENTIN) 875-125 MG tablet; Take 1 tablet by mouth 2 (two) times daily.  Dispense: 14 tablet; Refill: 0 - fluticasone (FLONASE) 50 MCG/ACT nasal spray; Place 2 sprays into both nostrils daily.  Dispense: 16 g; Refill: 12  Return if symptoms worsen or fail to improve.    I discussed the assessment and treatment plan with the patient. The patient was provided an opportunity to ask questions and all were answered. The patient agreed with the plan and demonstrated an understanding of the instructions.   The patient was advised to call back or seek an in-person evaluation if the symptoms worsen or if the condition fails to improve as anticipated.  I provided 20 minutes of non-face-to-face interaction with this MYCHART visit including intake, same-day documentation, and chart review. - Converted to telephone  Tollie Eth, NP

## 2020-09-28 NOTE — Patient Instructions (Addendum)
° °  Adult Basic Symptom Management for Sinusitis  Congestion: Guaifenesin (Mucinex)- follow directions on packaging with a maximum dose of 2400mg  in a 24 hour period.  Pain/Fever: Ibuprofen 200mg  - 400mg  every 4-6 hours as needed (MAX 1200mg  in a 24 hour period) Pain/Fever: Tylenol 500mg  -1000mg  every 6-8 hours as needed (MAX 3000mg  in a 24 hour period)  Cough: Dextromethorphan (Delsym)- follow directions on packing with a maximum dose of 120mg  in a 24 hour period.  Nasal Stuffiness: Saline nasal spray and/or Nettie Pot with sterile saline solution Fluticasone nasal spray (Flonase) OR Mometasone nasal spray (Nasonex) OR Triamcinolone Acetonide nasal spray (Nasacort)- follow directions on the packaging  Pain/Pressure: Warm washcloth to the face  Sore Throat: Warm salt water gargles  If you have allergies, you may also consider taking an oral antihistamine (like Zyrtec or Claritin) as these may also help with your symptoms.  **Many medications will have more than one ingredient, be sure you are reading the packaging carefully and not taking more than one dose of the same kind of medication at the same time or too close together. It is OK to use formulas that have all of the ingredients you want, but do not take them in a combined medication and as separate dose too close together. If you have any questions, the pharmacist will be happy to help you decide what is safe.

## 2020-10-22 ENCOUNTER — Other Ambulatory Visit: Payer: Self-pay | Admitting: Nurse Practitioner

## 2020-10-22 DIAGNOSIS — E039 Hypothyroidism, unspecified: Secondary | ICD-10-CM

## 2020-10-25 ENCOUNTER — Ambulatory Visit (INDEPENDENT_AMBULATORY_CARE_PROVIDER_SITE_OTHER): Payer: Medicare Other | Admitting: Physician Assistant

## 2020-10-25 ENCOUNTER — Other Ambulatory Visit: Payer: Self-pay

## 2020-10-25 ENCOUNTER — Encounter: Payer: Self-pay | Admitting: Physician Assistant

## 2020-10-25 VITALS — BP 103/53 | HR 102 | Ht 69.0 in | Wt 235.0 lb

## 2020-10-25 DIAGNOSIS — G894 Chronic pain syndrome: Secondary | ICD-10-CM | POA: Diagnosis not present

## 2020-10-25 DIAGNOSIS — E039 Hypothyroidism, unspecified: Secondary | ICD-10-CM

## 2020-10-25 DIAGNOSIS — F411 Generalized anxiety disorder: Secondary | ICD-10-CM

## 2020-10-25 DIAGNOSIS — G8929 Other chronic pain: Secondary | ICD-10-CM

## 2020-10-25 DIAGNOSIS — M5137 Other intervertebral disc degeneration, lumbosacral region: Secondary | ICD-10-CM

## 2020-10-25 DIAGNOSIS — Z6835 Body mass index (BMI) 35.0-35.9, adult: Secondary | ICD-10-CM

## 2020-10-25 DIAGNOSIS — G43009 Migraine without aura, not intractable, without status migrainosus: Secondary | ICD-10-CM

## 2020-10-25 DIAGNOSIS — M546 Pain in thoracic spine: Secondary | ICD-10-CM | POA: Diagnosis not present

## 2020-10-25 DIAGNOSIS — F410 Panic disorder [episodic paroxysmal anxiety] without agoraphobia: Secondary | ICD-10-CM

## 2020-10-25 DIAGNOSIS — M5442 Lumbago with sciatica, left side: Secondary | ICD-10-CM

## 2020-10-25 DIAGNOSIS — I10 Essential (primary) hypertension: Secondary | ICD-10-CM

## 2020-10-25 DIAGNOSIS — M5136 Other intervertebral disc degeneration, lumbar region: Secondary | ICD-10-CM

## 2020-10-25 DIAGNOSIS — M5441 Lumbago with sciatica, right side: Secondary | ICD-10-CM

## 2020-10-25 DIAGNOSIS — M797 Fibromyalgia: Secondary | ICD-10-CM

## 2020-10-25 DIAGNOSIS — I959 Hypotension, unspecified: Secondary | ICD-10-CM

## 2020-10-25 DIAGNOSIS — F332 Major depressive disorder, recurrent severe without psychotic features: Secondary | ICD-10-CM

## 2020-10-25 MED ORDER — WEGOVY 0.25 MG/0.5ML ~~LOC~~ SOAJ
0.2500 mg | SUBCUTANEOUS | 0 refills | Status: DC
Start: 2020-10-25 — End: 2021-02-27

## 2020-10-25 MED ORDER — HYDROCODONE-ACETAMINOPHEN 5-325 MG PO TABS
1.0000 | ORAL_TABLET | Freq: Two times a day (BID) | ORAL | 0 refills | Status: DC | PRN
Start: 2020-12-21 — End: 2021-02-27

## 2020-10-25 MED ORDER — LEVOTHYROXINE SODIUM 150 MCG PO TABS: ORAL_TABLET | ORAL | 1 refills | Status: DC

## 2020-10-25 MED ORDER — VIIBRYD 40 MG PO TABS: 40.0000 mg | ORAL_TABLET | Freq: Every day | ORAL | 2 refills | Status: DC

## 2020-10-25 MED ORDER — HYDROCODONE-ACETAMINOPHEN 5-325 MG PO TABS: ORAL_TABLET | ORAL | 0 refills | Status: DC

## 2020-10-25 MED ORDER — HYDROCODONE-ACETAMINOPHEN 5-325 MG PO TABS
1.0000 | ORAL_TABLET | Freq: Two times a day (BID) | ORAL | 0 refills | Status: DC | PRN
Start: 2020-11-24 — End: 2021-02-27

## 2020-10-25 MED ORDER — HYDROCHLOROTHIAZIDE 25 MG PO TABS: 25.0000 mg | ORAL_TABLET | Freq: Every day | ORAL | 1 refills | Status: DC

## 2020-10-25 MED ORDER — AMLODIPINE BESYLATE 10 MG PO TABS
10.0000 mg | ORAL_TABLET | Freq: Every day | ORAL | 1 refills | Status: DC
Start: 2020-10-25 — End: 2021-02-27

## 2020-10-25 NOTE — Progress Notes (Signed)
Subjective:    Patient ID: Aimee Little, female    DOB: October 28, 1960, 60 y.o.   MRN: [id number]  HPI  Patient is a 60 year old female with hypertension, migraines, hypothyroidism, chronic pain due to thoracic/lumbar DDD, and MDD who presents to the clinic for 46-month follow-up and medication refills.  Overall chronic pain is fairly well controlled.  No significant problems.  Her thoracic pain has improved greatly.  She continues to have intermittent problems with her lumbar back pain.  Patient is currently on Celexa.  She feels like it is helping her mood but it is making her gain weight.  She would like to try something else for her mood and weight.    Active Ambulatory Problems    Diagnosis Date Noted  . HOT FLASHES 12/29/2009  . [place] DISEASE, LUMBAR 12/29/2009  . Fibromyalgia 01/01/2013  . Unspecified vitamin D deficiency 05/16/2013  . Migraine without status migrainosus, not intractable 07/21/2013  . Chronic pain syndrome 11/05/2013  . Anxiety and depression 12/17/2013  . Adenomatous colon polyp 12/20/2013  . Obesity 11/25/2014  . Chronic fatigue 05/26/2015  . RLS (restless legs syndrome) 09/22/2015  . Hypothyroid 10/27/2015  . Insomnia 11/21/2015  . Severe episode of recurrent major depressive disorder, without psychotic features ([place]) 11/29/2015  . Rhinitis, allergic 11/29/2015  . Vaginal atrophy 01/02/2016  . Generalized anxiety disorder 03/19/2016  . Panic attacks 03/19/2016  . Loose body in knee 04/17/2016  . Chondromalacia of patellofemoral joint 04/17/2016  . Diverticulitis of colon without hemorrhage 07/19/2016  . Diverticulitis 09/10/2017  . Light tobacco smoker 10/01/2017  . Essential hypertension 10/01/2017  . DDD (degenerative disc disease), lumbar 04/01/2018  . OSA on CPAP 05/29/2018  . Chronic bilateral thoracic back pain 08/04/2018  . Osteopenia 03/24/2019  . Chronic bilateral low back pain with bilateral sciatica 12/13/2019  . DDD (degenerative disc  disease), thoracic 01/04/2020  . Mixed hyperlipidemia 01/20/2020  . Bilateral lower extremity edema 09/01/2020   Resolved Ambulatory Problems    Diagnosis Date Noted  . Unspecified hypothyroidism 12/29/2009  . TOBACCO ABUSE 06/13/2010  . GRIEF REACTION, ACUTE 12/29/2009  . Depression 02/06/2010  . Osteoarthrosis involving lower leg 12/29/2009  . NAUSEA AND VOMITING 05/02/2010  . Osteoarthritis of left knee 07/21/2013  . Medication management 11/05/2013  . Nausea with vomiting 04/07/2014  . Lumbago 11/25/2014  . Sinusitis 04/13/2015  . Sacroiliac joint dysfunction of both sides 09/06/2015  . Bilateral low back pain without sciatica 09/06/2015  . Right knee pain 11/07/2015  . Left knee pain 01/23/2016  . Abdominal pain, left lower quadrant 04/25/2016  . Trochanteric bursitis of left hip 01/29/2017  . Nonintractable headache 03/06/2017  . Tachycardia 03/06/2017  . Rib pain on left side 08/03/2017  . Elevated blood pressure reading 10/01/2017  . Rib pain on right side 11/04/2017  . Traumatic hematoma of eyelid, right, initial encounter 11/04/2017  . Low grade fever 04/01/2018  . Acute bilateral low back pain with bilateral sciatica 04/01/2018  . Closed fracture of one rib of right side 12/01/2018  . Upper back pain 11/30/2019  . Mid back pain 12/13/2019  . Muscle spasm of back 12/13/2019  . Chest pain 09/01/2020  . Epigastric pain 09/01/2020   Past Medical History:  Diagnosis Date  . Migraine   . Thyroid disease   . Tobacco use       Review of Systems     Objective:   Physical Exam Vitals reviewed.  Constitutional:      Appearance: Normal appearance.  She is obese.  HENT:     Head: Normocephalic.  Cardiovascular:     Rate and Rhythm: Normal rate and regular rhythm.     Pulses: Normal pulses.     Heart sounds: Normal heart sounds.  Neurological:     General: No focal deficit present.     Mental Status: She is alert and oriented to person, place, and time.   Psychiatric:        Mood and Affect: Mood normal.       .. Depression screen [facility] 2/9 10/25/2020 06/01/2020 04/14/2020 01/19/2020 11/29/2019  Decreased Interest 1 3 3 3 2   Down, Depressed, Hopeless 1 2 1 1  0  PHQ - 2 Score 2 5 4 4 2   Altered sleeping 1 3 0 3 1  Tired, decreased energy 2 3 3 3 3   Change in appetite 2 0 0 0 0  Feeling bad or failure about yourself  0 0 1 1 0  Trouble concentrating 1 3 1 1 1   Moving slowly or fidgety/restless 0 3 0 0 1  Suicidal thoughts 0 0 0 0 0  PHQ-9 Score 8 17 9 12 8   Difficult doing work/chores Somewhat difficult Extremely dIfficult Very difficult Extremely dIfficult Somewhat difficult  Some recent data might be hidden   .Marland Kitchen GAD 7 : Generalized Anxiety Score 10/25/2020 06/01/2020 04/14/2020 01/19/2020  Nervous, Anxious, on Edge 1 3 2 1   Control/stop worrying 1 3 2 1   Worry too much - different things 1 3 0 1  Trouble relaxing 1 3 2  0  Restless 1 3 2 1   Easily annoyed or irritable 0 2 1 0  Afraid - awful might happen 1 3 1 1   Total GAD 7 Score 6 20 10 5   Anxiety Difficulty Somewhat difficult Very difficult Not difficult at all Extremely difficult        Assessment & Plan:  Marland KitchenMarland KitchenMelissa was seen today for follow-up.  Diagnoses and all orders for this visit:  Essential hypertension  DISC DISEASE, LUMBAR -     HYDROcodone-acetaminophen (NORCO/VICODIN) 5-325 MG tablet; Take one tablet twice daily PRN for moderate to severe pain. -     HYDROcodone-acetaminophen (NORCO/VICODIN) 5-325 MG tablet; Take 1 tablet by mouth 2 (two) times daily as needed for moderate pain. -     HYDROcodone-acetaminophen (NORCO/VICODIN) 5-325 MG tablet; Take 1 tablet by mouth 2 (two) times daily as needed for moderate pain.  Chronic bilateral thoracic back pain -     HYDROcodone-acetaminophen (NORCO/VICODIN) 5-325 MG tablet; Take one tablet twice daily PRN for moderate to severe pain. -     HYDROcodone-acetaminophen (NORCO/VICODIN) 5-325 MG tablet; Take 1 tablet by mouth 2  (two) times daily as needed for moderate pain. -     HYDROcodone-acetaminophen (NORCO/VICODIN) 5-325 MG tablet; Take 1 tablet by mouth 2 (two) times daily as needed for moderate pain.  Chronic pain syndrome -     HYDROcodone-acetaminophen (NORCO/VICODIN) 5-325 MG tablet; Take one tablet twice daily PRN for moderate to severe pain. -     HYDROcodone-acetaminophen (NORCO/VICODIN) 5-325 MG tablet; Take 1 tablet by mouth 2 (two) times daily as needed for moderate pain. -     HYDROcodone-acetaminophen (NORCO/VICODIN) 5-325 MG tablet; Take 1 tablet by mouth 2 (two) times daily as needed for moderate pain.  Fibromyalgia -     HYDROcodone-acetaminophen (NORCO/VICODIN) 5-325 MG tablet; Take one tablet twice daily PRN for moderate to severe pain. -     HYDROcodone-acetaminophen (NORCO/VICODIN) 5-325 MG tablet; Take 1 tablet  by mouth 2 (two) times daily as needed for moderate pain. -     HYDROcodone-acetaminophen (NORCO/VICODIN) 5-325 MG tablet; Take 1 tablet by mouth 2 (two) times daily as needed for moderate pain.  Hypertension goal BP (blood pressure) < 130/80 -     hydrochlorothiazide (HYDRODIURIL) 25 MG tablet; Take 1 tablet (25 mg total) by mouth daily. -     amLODipine (NORVASC) 10 MG tablet; Take 1 tablet (10 mg total) by mouth daily.  Hypothyroidism, unspecified type -     levothyroxine (SYNTHROID) 150 MCG tablet; Take 1 tablet (150 mcg) by mouth Sun, Mon, Wed, Fri, Sat. Take 1.5 tablets (225 mcg) Tuesday and Thursday. Take on empty stomach at least 30 minutes prior to food or other medications.  Migraine without aura and without status migrainosus, not intractable  Chronic bilateral low back pain with bilateral sciatica -     HYDROcodone-acetaminophen (NORCO/VICODIN) 5-325 MG tablet; Take one tablet twice daily PRN for moderate to severe pain. -     HYDROcodone-acetaminophen (NORCO/VICODIN) 5-325 MG tablet; Take 1 tablet by mouth 2 (two) times daily as needed for moderate pain. -      HYDROcodone-acetaminophen (NORCO/VICODIN) 5-325 MG tablet; Take 1 tablet by mouth 2 (two) times daily as needed for moderate pain.  DDD (degenerative disc disease), lumbar -     HYDROcodone-acetaminophen (NORCO/VICODIN) 5-325 MG tablet; Take one tablet twice daily PRN for moderate to severe pain. -     HYDROcodone-acetaminophen (NORCO/VICODIN) 5-325 MG tablet; Take 1 tablet by mouth 2 (two) times daily as needed for moderate pain. -     HYDROcodone-acetaminophen (NORCO/VICODIN) 5-325 MG tablet; Take 1 tablet by mouth 2 (two) times daily as needed for moderate pain.  Generalized anxiety disorder  Severe episode of recurrent major depressive disorder, without psychotic features (HCC) -     Vilazodone HCl (VIIBRYD) 40 MG TABS; Take 1 tablet (40 mg total) by mouth daily.  Panic attacks  Class 2 severe obesity due to excess calories with serious comorbidity and body mass index (BMI) of 35.0 to 35.9 in adult (HCC) -     Semaglutide-Weight Management (WEGOVY) 0.25 MG/0.5ML SOAJ; Inject 0.25 mg into the skin once a week.   Marland KitchenMarland KitchenPDMP reviewed during this encounter. Refilled hydrocodone Pain contract and UDS up-to-date  .Marland KitchenDiscussed low carb diet with 1500 calories and 80g of protein.  Exercising at least 150 minutes a week.  [employer] could be a Microbiologist.  Stop Celexa and start Viibryd.  Viibryd does have a better weight neutral side effect profile. Not sure if insurance will pay for wegovy. Discussed with patient titration.  Sent over first dose.  Discussed side effects.  If insurance does pay for this medication please follow-up so we can go over detailed information.

## 2020-10-26 ENCOUNTER — Telehealth: Payer: Self-pay

## 2020-10-26 NOTE — Telephone Encounter (Signed)
PA for [facility] 0.25mg /0.58ml pens submitted through covermymeds; awaiting response.

## 2020-10-27 ENCOUNTER — Telehealth (INDEPENDENT_AMBULATORY_CARE_PROVIDER_SITE_OTHER): Payer: Medicare Other | Admitting: Physician Assistant

## 2020-10-27 VITALS — Ht 69.0 in | Wt 235.0 lb

## 2020-10-27 DIAGNOSIS — M5442 Lumbago with sciatica, left side: Secondary | ICD-10-CM | POA: Diagnosis not present

## 2020-10-27 DIAGNOSIS — G894 Chronic pain syndrome: Secondary | ICD-10-CM

## 2020-10-27 DIAGNOSIS — M5136 Other intervertebral disc degeneration, lumbar region: Secondary | ICD-10-CM | POA: Diagnosis not present

## 2020-10-27 DIAGNOSIS — M5441 Lumbago with sciatica, right side: Secondary | ICD-10-CM

## 2020-10-27 DIAGNOSIS — G8929 Other chronic pain: Secondary | ICD-10-CM

## 2020-10-27 MED ORDER — PREDNISONE 50 MG PO TABS: 50.0000 mg | ORAL_TABLET | Freq: Every day | ORAL | 0 refills | Status: DC

## 2020-10-27 NOTE — Progress Notes (Signed)
No injury Did "too much" yesterday (walking, laundry, etc) Having a lot of back pain Didn't sleep last night Hydrocodone taking edge off but still having a lot of pain Spot above tail bone and having sciatic pain down both legs Using heating pad - helping some

## 2020-10-27 NOTE — Progress Notes (Signed)
Patient ID: Aimee Little, female   DOB: 09/10/1961, 61 y.o.   MRN: [id number] .Marland KitchenVirtual Visit via Telephone Note  I connected with Aimee Little on 10/27/2020 at  8:50 AM EST by telephone and verified that I am speaking with the correct person using two identifiers.  Location: Patient: home Provider: clinic  .Marland KitchenParticipating in visit:  Patient: Aimee Little Provider: Iran Planas PA-C   I discussed the limitations, risks, security and privacy concerns of performing an evaluation and management service by telephone and the availability of in person appointments. I also discussed with the patient that there may be a patient responsible charge related to this service. The patient expressed understanding and agreed to proceed.   History of Present Illness: Patient is a 60 year old obese female with lumbar DDD and chronic pain who presents via telephone to the clinic to discuss acute back pain flare.  She denies any injury but she did too much yesterday.  She went on a walk, laundry, washing dishes, twisting and lifting.  She woke up in a lot of back pain today and does not want to go the weekend like this.  She does have her hydrocodone which takes the edge off but still has some radicular pain going down both legs.  She is using the heating pad, anti-inflammatory, Flexeril with minimal benefit. She request prednisone as she has had before. No saddle anesthesia, bowel or bladder dysfunction, lower leg strength changes.  .. Active Ambulatory Problems    Diagnosis Date Noted  . HOT FLASHES 12/29/2009  . [place] DISEASE, LUMBAR 12/29/2009  . Fibromyalgia 01/01/2013  . Unspecified vitamin D deficiency 05/16/2013  . Migraine without status migrainosus, not intractable 07/21/2013  . Chronic pain syndrome 11/05/2013  . Anxiety and depression 12/17/2013  . Adenomatous colon polyp 12/20/2013  . Obesity 11/25/2014  . Chronic fatigue 05/26/2015  . RLS (restless legs syndrome) 09/22/2015  . Hypothyroid  10/27/2015  . Insomnia 11/21/2015  . Severe episode of recurrent major depressive disorder, without psychotic features ([place]) 11/29/2015  . Rhinitis, allergic 11/29/2015  . Vaginal atrophy 01/02/2016  . Generalized anxiety disorder 03/19/2016  . Panic attacks 03/19/2016  . Loose body in knee 04/17/2016  . Chondromalacia of patellofemoral joint 04/17/2016  . Diverticulitis of colon without hemorrhage 07/19/2016  . Diverticulitis 09/10/2017  . Light tobacco smoker 10/01/2017  . Essential hypertension 10/01/2017  . DDD (degenerative disc disease), lumbar 04/01/2018  . OSA on CPAP 05/29/2018  . Chronic bilateral thoracic back pain 08/04/2018  . Osteopenia 03/24/2019  . Chronic bilateral low back pain with bilateral sciatica 12/13/2019  . DDD (degenerative disc disease), thoracic 01/04/2020  . Mixed hyperlipidemia 01/20/2020  . Bilateral lower extremity edema 09/01/2020   Resolved Ambulatory Problems    Diagnosis Date Noted  . Unspecified hypothyroidism 12/29/2009  . TOBACCO ABUSE 06/13/2010  . GRIEF REACTION, ACUTE 12/29/2009  . Depression 02/06/2010  . Osteoarthrosis involving lower leg 12/29/2009  . NAUSEA AND VOMITING 05/02/2010  . Osteoarthritis of left knee 07/21/2013  . Medication management 11/05/2013  . Nausea with vomiting 04/07/2014  . Lumbago 11/25/2014  . Sinusitis 04/13/2015  . Sacroiliac joint dysfunction of both sides 09/06/2015  . Bilateral low back pain without sciatica 09/06/2015  . Right knee pain 11/07/2015  . Left knee pain 01/23/2016  . Abdominal pain, left lower quadrant 04/25/2016  . Trochanteric bursitis of left hip 01/29/2017  . Nonintractable headache 03/06/2017  . Tachycardia 03/06/2017  . Rib pain on left side 08/03/2017  . Elevated blood pressure reading  10/01/2017  . Rib pain on right side 11/04/2017  . Traumatic hematoma of eyelid, right, initial encounter 11/04/2017  . Low grade fever 04/01/2018  . Acute bilateral low back pain with  bilateral sciatica 04/01/2018  . Closed fracture of one rib of right side 12/01/2018  . Upper back pain 11/30/2019  . Mid back pain 12/13/2019  . Muscle spasm of back 12/13/2019  . Chest pain 09/01/2020  . Epigastric pain 09/01/2020   Past Medical History:  Diagnosis Date  . Migraine   . Thyroid disease   . Tobacco use    See med, allergy, problem list.     Observations/Objective: No acute distress Normal mood  .Marland Kitchen Today's Vitals   10/27/20 0819  Weight: 235 lb (106.6 kg)  Height: 5\' 9"  (1.753 m)   Body mass index is 34.7 kg/m.   Assessment and Plan: Marland KitchenMarland KitchenMelissa was seen today for back pain.  Diagnoses and all orders for this visit:  DDD (degenerative disc disease), lumbar -     predniSONE (DELTASONE) 50 MG tablet; Take 1 tablet (50 mg total) by mouth daily with breakfast.  Chronic pain syndrome -     predniSONE (DELTASONE) 50 MG tablet; Take 1 tablet (50 mg total) by mouth daily with breakfast.  Chronic bilateral low back pain with bilateral sciatica -     predniSONE (DELTASONE) 50 MG tablet; Take 1 tablet (50 mg total) by mouth daily with breakfast.   No known injury.  She was very active for the past few days.  Likely a flare of her chronic pain.  In the past she has responded greatly to prednisone burst.  Prednisone sent to pharmacy.  We discussed symptomatic care with IcyHot patches, stretches, TENS unit, rest, NSAIDs.  Follow-up as needed or if symptoms worsen.    Follow Up Instructions:    I discussed the assessment and treatment plan with the patient. The patient was provided an opportunity to ask questions and all were answered. The patient agreed with the plan and demonstrated an understanding of the instructions.   The patient was advised to call back or seek an in-person evaluation if the symptoms worsen or if the condition fails to improve as anticipated.  I provided 10 minutes of non-face-to-face time during this encounter.   Iran Planas,  PA-C

## 2020-10-30 ENCOUNTER — Encounter: Payer: Self-pay | Admitting: Physician Assistant

## 2020-10-31 NOTE — Telephone Encounter (Signed)
PA for [facility] was denied due to no coverage under Medicare. Provider aware, as well as, patient being aware.

## 2020-11-02 ENCOUNTER — Telehealth: Payer: Self-pay | Admitting: Neurology

## 2020-11-02 NOTE — Telephone Encounter (Signed)
Flexeril PA submitted via covermymeds.  Your information has been submitted to [place]. [employer] [place] will review the request and notify you of the determination decision directly, typically within 3 business days of your submission and once all necessary information is received.  You will also receive your request decision electronically. To check for an update later, open the request again from your dashboard.  If [employer] has not responded within the specified timeframe or if you have any questions about your PA submission, contact [place] directly at [facility]) [phone] or ([place]) [phone].   Viibryd PA submitted via covermymeds. Your information has been submitted to [place]. [employer] Cross [place] will review the request and notify you of the determination decision directly, typically within 3 business days of your submission and once all necessary information is received.  You will also receive your request decision electronically. To check for an update later, open the request again from your dashboard.  If [employer] [place] has not responded within the specified timeframe or if you have any questions about your PA submission, contact [place] directly at [facility]) [phone] or ([place]) [phone].

## 2020-11-02 NOTE — Telephone Encounter (Signed)
VIIBRYD This request has received a Cancelled outcome.  This may mean either your patient does not have active coverage with this plan, this authorization was processed as a duplicate request, or an authorization was not needed for this medication.  Note any additional information provided by Sherre Poot [place] at the bottom of this request, and contact [employer] [place] directly for further

## 2020-11-03 NOTE — Telephone Encounter (Signed)
Received note from plan about Viibryd.   This medication is on the member's formulary. Patient has paid a claim dated 10/25/2020.

## 2020-11-08 ENCOUNTER — Encounter: Payer: Medicare Other | Admitting: Physician Assistant

## 2020-11-11 ENCOUNTER — Other Ambulatory Visit: Payer: Self-pay | Admitting: Physician Assistant

## 2020-11-11 DIAGNOSIS — F332 Major depressive disorder, recurrent severe without psychotic features: Secondary | ICD-10-CM

## 2020-11-20 ENCOUNTER — Telehealth: Payer: Self-pay

## 2020-11-20 DIAGNOSIS — R0789 Other chest pain: Secondary | ICD-10-CM | POA: Diagnosis not present

## 2020-11-20 DIAGNOSIS — I10 Essential (primary) hypertension: Secondary | ICD-10-CM | POA: Diagnosis not present

## 2020-11-20 DIAGNOSIS — R9431 Abnormal electrocardiogram [ECG] [EKG]: Secondary | ICD-10-CM | POA: Insufficient documentation

## 2020-11-20 DIAGNOSIS — R000 Tachycardia, unspecified: Secondary | ICD-10-CM | POA: Diagnosis not present

## 2020-11-20 NOTE — Telephone Encounter (Signed)
Aimee Little states she has been on a diet for 3 weeks. She reports not weight loss. She believes she has a thyroid problem and wants to be referred to Endocrinology. Please advise.

## 2020-11-21 ENCOUNTER — Other Ambulatory Visit: Payer: Self-pay | Admitting: Physician Assistant

## 2020-11-21 DIAGNOSIS — E039 Hypothyroidism, unspecified: Secondary | ICD-10-CM

## 2020-11-21 NOTE — Telephone Encounter (Signed)
Done

## 2020-11-21 NOTE — Telephone Encounter (Signed)
I tried to call, no answer and no voicemail.

## 2020-11-21 NOTE — Telephone Encounter (Signed)
She would like to proceed with the referral.

## 2020-11-21 NOTE — Telephone Encounter (Signed)
You do have dx of hypothyroidism but your last check labs for TSH were .56 which are on the HYPER side of normal thus I doubt causing any weight issues. I am more than happy to make referral for you to have a consult.

## 2020-11-22 ENCOUNTER — Other Ambulatory Visit: Payer: Self-pay | Admitting: Physician Assistant

## 2020-11-22 DIAGNOSIS — K2100 Gastro-esophageal reflux disease with esophagitis, without bleeding: Secondary | ICD-10-CM

## 2020-11-27 NOTE — Telephone Encounter (Signed)
Called pt to inform her of referral. Pt stated she has an appt with endocrinology and has started the mediterranean diet.

## 2020-12-04 DIAGNOSIS — I10 Essential (primary) hypertension: Secondary | ICD-10-CM | POA: Diagnosis not present

## 2020-12-05 ENCOUNTER — Other Ambulatory Visit: Payer: Self-pay

## 2020-12-05 DIAGNOSIS — F410 Panic disorder [episodic paroxysmal anxiety] without agoraphobia: Secondary | ICD-10-CM

## 2020-12-05 MED ORDER — ALPRAZOLAM 0.5 MG PO TABS
0.5000 mg | ORAL_TABLET | Freq: Two times a day (BID) | ORAL | 1 refills | Status: DC | PRN
Start: 2020-12-05 — End: 2021-05-18

## 2020-12-08 ENCOUNTER — Other Ambulatory Visit: Payer: Self-pay

## 2020-12-08 ENCOUNTER — Telehealth (INDEPENDENT_AMBULATORY_CARE_PROVIDER_SITE_OTHER): Payer: Medicare Other | Admitting: Family Medicine

## 2020-12-08 ENCOUNTER — Encounter: Payer: Self-pay | Admitting: Family Medicine

## 2020-12-08 DIAGNOSIS — F419 Anxiety disorder, unspecified: Secondary | ICD-10-CM | POA: Diagnosis not present

## 2020-12-08 DIAGNOSIS — F32A Depression, unspecified: Secondary | ICD-10-CM

## 2020-12-08 MED ORDER — BUSPIRONE HCL 15 MG PO TABS: 15.0000 mg | ORAL_TABLET | Freq: Three times a day (TID) | ORAL | 1 refills | Status: DC

## 2020-12-08 NOTE — Patient Instructions (Signed)
http://[email or web address].NIH.Gov">  Generalized Anxiety Disorder, Adult Generalized anxiety disorder (GAD) is a mental health condition. Unlike normal worries, anxiety related to GAD is not triggered by a specific event. These worries do not fade or get better with time. GAD interferes with relationships, work, and school. GAD symptoms can vary from mild to severe. People with severe GAD can have intense waves of anxiety with physical symptoms that are similar to panic attacks. What are the causes? The exact cause of GAD is not known, but the following are believed to have an impact:  Differences in natural brain chemicals.  Genes passed down from parents to children.  Differences in the way threats are perceived.  Development during childhood.  Personality. What increases the risk? The following factors may make you more likely to develop this condition:  Being female.  Having a family history of anxiety disorders.  Being very shy.  Experiencing very stressful life events, such as the death of a loved one.  Having a very stressful family environment. What are the signs or symptoms? People with GAD often worry excessively about many things in their lives, such as their health and family. Symptoms may also include:  Mental and emotional symptoms: ? Worrying excessively about natural disasters. ? Fear of being late. ? Difficulty concentrating. ? Fears that others are judging your performance.  Physical symptoms: ? Fatigue. ? Headaches, muscle tension, muscle twitches, trembling, or feeling shaky. ? Feeling like your heart is pounding or beating very fast. ? Feeling out of breath or like you cannot take a deep breath. ? Having trouble falling asleep or staying asleep, or experiencing restlessness. ? Sweating. ? Nausea, diarrhea, or irritable bowel syndrome (IBS).  Behavioral symptoms: ? Experiencing erratic moods or irritability. ? Avoidance of new situations. ? Avoidance of  people. ? Extreme difficulty making decisions. How is this diagnosed? This condition is diagnosed based on your symptoms and medical history. You will also have a physical exam. Your health care provider may perform tests to rule out other possible causes of your symptoms. To be diagnosed with GAD, a person must have anxiety that:  Is out of his or her control.  Affects several different aspects of his or her life, such as work and relationships.  Causes distress that makes him or her unable to take part in normal activities.  Includes at least three symptoms of GAD, such as restlessness, fatigue, trouble concentrating, irritability, muscle tension, or sleep problems. Before your health care provider can confirm a diagnosis of GAD, these symptoms must be present more days than they are not, and they must last for 6 months or longer. How is this treated? This condition may be treated with:  Medicine. Antidepressant medicine is usually prescribed for long-term daily control. Anti-anxiety medicines may be added in severe cases, especially when panic attacks occur.  Talk therapy (psychotherapy). Certain types of talk therapy can be helpful in treating GAD by providing support, education, and guidance. Options include: ? Cognitive behavioral therapy (CBT). People learn coping skills and self-calming techniques to ease their physical symptoms. They learn to identify unrealistic thoughts and behaviors and to replace them with more appropriate thoughts and behaviors. ? Acceptance and commitment therapy (ACT). This treatment teaches people how to be mindful as a way to cope with unwanted thoughts and feelings. ? Biofeedback. This process trains you to manage your body's response (physiological response) through breathing techniques and relaxation methods. You will work with a therapist while machines are used to monitor your physical   symptoms.  Stress management techniques. These include yoga,  meditation, and exercise. A mental health specialist can help determine which treatment is best for you. Some people see improvement with one type of therapy. However, other people require a combination of therapies.   Follow these instructions at home: Lifestyle  Maintain a consistent routine and schedule.  Anticipate stressful situations. Create a plan, and allow extra time to work with your plan.  Practice stress management or self-calming techniques that you have learned from your therapist or your health care provider. General instructions  Take over-the-counter and prescription medicines only as told by your health care provider.  Understand that you are likely to have setbacks. Accept this and be kind to yourself as you persist to take better care of yourself.  Recognize and accept your accomplishments, even if you judge them as small.  Keep all follow-up visits as told by your health care provider. This is important. Contact a health care provider if:  Your symptoms do not get better.  Your symptoms get worse.  You have signs of depression, such as: ? A persistently sad or irritable mood. ? Loss of enjoyment in activities that used to bring you joy. ? Change in weight or eating. ? Changes in sleeping habits. ? Avoiding friends or family members. ? Loss of energy for normal tasks. ? Feelings of guilt or worthlessness. Get help right away if:  You have serious thoughts about hurting yourself or others. If you ever feel like you may hurt yourself or others, or have thoughts about taking your own life, get help right away. Go to your nearest emergency department or:  Call your local emergency services (911 in the [place]  Call a suicide crisis helpline, such as the National Suicide Prevention Lifeline at 1-800-273-8255. This is open 24 hours a day in the [place]  Text the Crisis Text Line at 741741 (in the [place]S.). Summary  Generalized anxiety disorder (GAD) is a mental  health condition that involves worry that is not triggered by a specific event.  People with GAD often worry excessively about many things in their lives, such as their health and family.  GAD may cause symptoms such as restlessness, trouble concentrating, sleep problems, frequent sweating, nausea, diarrhea, headaches, and trembling or muscle twitching.  A mental health specialist can help determine which treatment is best for you. Some people see improvement with one type of therapy. However, other people require a combination of therapies. This information is not intended to replace advice given to you by your health care provider. Make sure you discuss any questions you have with your health care provider. Document Revised: 07/07/2019 Document Reviewed: 07/07/2019 Elsevier Patient Education  2021 Elsevier Inc.  

## 2020-12-08 NOTE — Progress Notes (Signed)
Virtual Visit via Telephone Note  I connected with  Aimee Little on 12/08/20 at  8:10 AM EST by telephone and verified that I am speaking with the correct person using two identifiers.   I discussed the limitations, risks, security and privacy concerns of performing an evaluation and management service by telephone and the availability of in person appointments. I also discussed with the patient that there may be a patient responsible charge related to this service. The patient expressed understanding and agreed to proceed.  Participating parties included in this telephone visit include: The patient and the nurse practitioner listed.  The patient is: At home I am: In the office  Subjective:    CC: anxiety  HPI: Aimee Little is a 60 y.o. year old female presenting today via telephone visit to discuss anxiety.  Patient states that on Monday she started having some panic attacks. She called PCP on Tuesday, asking for a xanax refill. She has tried taking a couple xanax over the past few days with mild improvement. She cannot think of any specific triggers for these recent attacks and states she has not had any like this in a very long time. She has been taking her Buspar twice a day regularly, but did try taking 3 yesterday for the first time, but isn't sure if it made a difference. She states she is just feeling overall anxious and then she starts to panic because she doesn't know why she is panicking. States she has low energy and just wants to lay in bed and try to sleep to prevent the panic attacks. States she has been doing some extra cleaning around her house the past 2 days because she just found out on Wednesday that her apartment will be inspected sometime soon. Reports she is not anxious about this because she is fairly clean person, but the extra cleaning has made her more tired. Overall, she has been sleeping well apart from one night this week where she couldn't sleep, but could not  identify any triggers. She slept well last night. She states she has tried referrals to psych and counselors in the past, but did not find them very useful.  She denies chest pain, shortness of breath, suicidal/homicidal ideation.   Past medical history, Surgical history, Family history not pertinant except as noted below, Social history, Allergies, and medications have been entered into the medical record, reviewed, and corrections made.   Review of Systems:  All review of systems negative except what is listed in the HPI  Objective:    General:  Patient speaking clearly in complete sentences. No shortness of breath noted.   Alert and oriented x3.   Normal judgment.  No apparent acute distress.  Flowsheet Row Video Visit from 12/08/2020 in [place]  PHQ-9 Total Score 10     GAD 7 : Generalized Anxiety Score 12/08/2020 10/25/2020 06/01/2020 04/14/2020  Nervous, Anxious, on Edge 2 1 3 2   Control/stop worrying 1 1 3 2   Worry too much - different things 1 1 3  0  Trouble relaxing 2 1 3 2   Restless 2 1 3 2   Easily annoyed or irritable 0 0 2 1  Afraid - awful might happen 1 1 3 1   Total GAD 7 Score 9 6 20 10   Anxiety Difficulty Somewhat difficult Somewhat difficult Very difficult Not difficult at all       Impression and Recommendations:    1. Anxiety and depression Patient with  worsening anxiety and depression as evidenced by conversation and GAD/PHQ9 done today. Unable to identify any specific triggers at this time. In past notes from similar episodes last year, Jari Favre suggested considering an increase in her Buspar to TID if her symptoms were not well controlled. I will go ahead and send in a new prescription for her to start taking three times per day for the next 2 weeks to see if she notices any improvement. She is concerned about getting "hooked" on xanax, so I recommended hydroxyzine, but she reports she has tried that in the past and it  did nothing. Suggest she try non-pharmacologic management including relaxation techniques, prayer/meditation, exercise, fresh air, etc. Also recommended getting set up with psych or counseling again, but she is not interested at this time. Educated on proper xanax use and encouraged her to use only when necessary and not combine with other sedating medications.   Recommend she follow-up in about 2 weeks to see if the increased Buspar dosage is helping at all. Could also consider adding or changing to an SSRI/SNRI, although she states she has tried at least one (she thinks it was celexa) that seemed to increase her anxiety. States she will also reconsider psychiatrist or counseling if no improvement in the next 2 weeks.    - busPIRone (BUSPAR) 15 MG tablet; Take 1 tablet (15 mg total) by mouth 3 (three) times daily. TAKE ONE TABLET BY MOUTH 2 TIMES A DAY  Dispense: 180 tablet; Refill: 1  Follow-up sooner if symptoms worsen.    I discussed the assessment and treatment plan with the patient. The patient was provided an opportunity to ask questions and all were answered. The patient agreed with the plan and demonstrated an understanding of the instructions.   The patient was advised to call back or seek an in-person evaluation if the symptoms worsen or if the condition fails to improve as anticipated.  I provided 20 minutes of non-face-to-face time during this TELEPHONE encounter.    Terrilyn Saver, NP

## 2020-12-18 DIAGNOSIS — I10 Essential (primary) hypertension: Secondary | ICD-10-CM | POA: Diagnosis not present

## 2020-12-18 DIAGNOSIS — R000 Tachycardia, unspecified: Secondary | ICD-10-CM | POA: Diagnosis not present

## 2020-12-18 DIAGNOSIS — R9431 Abnormal electrocardiogram [ECG] [EKG]: Secondary | ICD-10-CM | POA: Diagnosis not present

## 2020-12-18 DIAGNOSIS — R0789 Other chest pain: Secondary | ICD-10-CM | POA: Diagnosis not present

## 2021-01-17 ENCOUNTER — Other Ambulatory Visit: Payer: Self-pay | Admitting: Physician Assistant

## 2021-01-17 DIAGNOSIS — M5137 Other intervertebral disc degeneration, lumbosacral region: Secondary | ICD-10-CM

## 2021-01-17 DIAGNOSIS — G894 Chronic pain syndrome: Secondary | ICD-10-CM

## 2021-01-17 DIAGNOSIS — M797 Fibromyalgia: Secondary | ICD-10-CM

## 2021-01-17 DIAGNOSIS — M5136 Other intervertebral disc degeneration, lumbar region: Secondary | ICD-10-CM

## 2021-01-17 DIAGNOSIS — M546 Pain in thoracic spine: Secondary | ICD-10-CM

## 2021-01-17 DIAGNOSIS — F5101 Primary insomnia: Secondary | ICD-10-CM

## 2021-01-17 DIAGNOSIS — G8929 Other chronic pain: Secondary | ICD-10-CM

## 2021-01-17 DIAGNOSIS — M5442 Lumbago with sciatica, left side: Secondary | ICD-10-CM

## 2021-01-17 NOTE — Telephone Encounter (Signed)
Trazodone last filled February 2021 for 6 months  Hydrocodone last written 12/21/2020 for one month  Last appt 12/08/2020

## 2021-01-18 ENCOUNTER — Other Ambulatory Visit: Payer: Self-pay | Admitting: Physician Assistant

## 2021-01-18 DIAGNOSIS — M5442 Lumbago with sciatica, left side: Secondary | ICD-10-CM

## 2021-01-18 DIAGNOSIS — G894 Chronic pain syndrome: Secondary | ICD-10-CM

## 2021-01-18 DIAGNOSIS — M5136 Other intervertebral disc degeneration, lumbar region: Secondary | ICD-10-CM

## 2021-01-18 DIAGNOSIS — G8929 Other chronic pain: Secondary | ICD-10-CM

## 2021-01-19 NOTE — Telephone Encounter (Signed)
Patient needs chronic pain management follow-up.  She was given 3 months worth in January so she is due for her follow-up.  She needs to make sure that she is scheduling these chronic pain management follow-ups on her way out after her visit so that she is not trying to get in at the last minute.

## 2021-01-19 NOTE — Telephone Encounter (Signed)
Please call patient to schedule follow up with [facility]

## 2021-01-19 NOTE — Telephone Encounter (Signed)
Patient has an appt scheduled with [name] on Monday 01/22/21. AM

## 2021-01-22 ENCOUNTER — Telehealth: Payer: Medicare Other | Admitting: Physician Assistant

## 2021-01-22 DIAGNOSIS — M47814 Spondylosis without myelopathy or radiculopathy, thoracic region: Secondary | ICD-10-CM | POA: Diagnosis not present

## 2021-01-22 DIAGNOSIS — Z0000 Encounter for general adult medical examination without abnormal findings: Secondary | ICD-10-CM | POA: Diagnosis not present

## 2021-01-22 DIAGNOSIS — G8929 Other chronic pain: Secondary | ICD-10-CM | POA: Diagnosis not present

## 2021-01-22 DIAGNOSIS — M545 Low back pain, unspecified: Secondary | ICD-10-CM | POA: Diagnosis not present

## 2021-01-22 LAB — BASIC METABOLIC PANEL
BUN: 14 (ref 4–21)
CO2: 23 — AB (ref 13–22)
Chloride: 105 (ref 99–108)
Creatinine: 0.9 (ref 0.5–1.1)
Glucose: 90
Potassium: 3.4 (ref 3.4–5.3)
Sodium: 141 (ref 137–147)

## 2021-01-22 LAB — HEPATIC FUNCTION PANEL
ALT: 17 (ref 7–35)
AST: 13 (ref 13–35)
Alkaline Phosphatase: 71 (ref 25–125)
Bilirubin, Total: 0.4

## 2021-01-22 LAB — CBC AND DIFFERENTIAL
HCT: 45 (ref 36–46)
Hemoglobin: 16.3 — AB (ref 12.0–16.0)
Platelets: 297 (ref 150–399)
WBC: 11.1

## 2021-01-22 LAB — HEMOGLOBIN A1C: Hemoglobin A1C: 4.6

## 2021-01-22 LAB — COMPREHENSIVE METABOLIC PANEL
Albumin: 4.5 (ref 3.5–5.0)
GFR calc Af Amer: 81
GFR calc non Af Amer: 67
Globulin: 2.2

## 2021-01-22 LAB — TSH: TSH: 3.82 (ref 0.41–5.90)

## 2021-01-22 LAB — VITAMIN D 25 HYDROXY (VIT D DEFICIENCY, FRACTURES): Vit D, 25-Hydroxy: 64.99

## 2021-01-22 LAB — LIPID PANEL
Cholesterol: 182 (ref 0–200)
HDL: 53 (ref 35–70)
LDL Cholesterol: 102
Triglycerides: 137 (ref 40–160)

## 2021-01-22 LAB — CBC: RBC: 4.82 (ref 3.87–5.11)

## 2021-02-01 DIAGNOSIS — M546 Pain in thoracic spine: Secondary | ICD-10-CM | POA: Diagnosis not present

## 2021-02-05 ENCOUNTER — Other Ambulatory Visit: Payer: Self-pay | Admitting: Physician Assistant

## 2021-02-05 DIAGNOSIS — G8929 Other chronic pain: Secondary | ICD-10-CM | POA: Diagnosis not present

## 2021-02-05 DIAGNOSIS — M545 Low back pain, unspecified: Secondary | ICD-10-CM | POA: Diagnosis not present

## 2021-02-05 DIAGNOSIS — M47814 Spondylosis without myelopathy or radiculopathy, thoracic region: Secondary | ICD-10-CM | POA: Diagnosis not present

## 2021-02-05 DIAGNOSIS — F332 Major depressive disorder, recurrent severe without psychotic features: Secondary | ICD-10-CM

## 2021-02-05 DIAGNOSIS — R5382 Chronic fatigue, unspecified: Secondary | ICD-10-CM | POA: Diagnosis not present

## 2021-02-06 DIAGNOSIS — M5412 Radiculopathy, cervical region: Secondary | ICD-10-CM | POA: Diagnosis not present

## 2021-02-08 NOTE — Telephone Encounter (Signed)
Received request for patient's Viibryd RX. Patient had told our front desk that she was seeking primary care elsewhere. I called patient to follow up. She states she still wants Joene Gelder to be her PCP. She is seeing pain management for her ongoing pain/pain prescriptions. Refilled Viibryd for 90 days. She was seen by Lovena Le in March. She was told to make a follow up appt in the next 90 days. She will call with any problems. [place].

## 2021-02-16 ENCOUNTER — Encounter: Payer: Self-pay | Admitting: Family Medicine

## 2021-02-16 ENCOUNTER — Ambulatory Visit (INDEPENDENT_AMBULATORY_CARE_PROVIDER_SITE_OTHER): Payer: Medicare Other | Admitting: Family Medicine

## 2021-02-16 ENCOUNTER — Other Ambulatory Visit: Payer: Self-pay

## 2021-02-16 VITALS — BP 128/73 | HR 106 | Temp 98.2°F | Ht 67.72 in | Wt 214.8 lb

## 2021-02-16 DIAGNOSIS — N949 Unspecified condition associated with female genital organs and menstrual cycle: Secondary | ICD-10-CM | POA: Diagnosis not present

## 2021-02-16 DIAGNOSIS — N3000 Acute cystitis without hematuria: Secondary | ICD-10-CM

## 2021-02-16 DIAGNOSIS — R8290 Unspecified abnormal findings in urine: Secondary | ICD-10-CM | POA: Diagnosis not present

## 2021-02-16 DIAGNOSIS — L309 Dermatitis, unspecified: Secondary | ICD-10-CM

## 2021-02-16 DIAGNOSIS — N760 Acute vaginitis: Secondary | ICD-10-CM | POA: Diagnosis not present

## 2021-02-16 DIAGNOSIS — B9689 Other specified bacterial agents as the cause of diseases classified elsewhere: Secondary | ICD-10-CM

## 2021-02-16 DIAGNOSIS — R1013 Epigastric pain: Secondary | ICD-10-CM

## 2021-02-16 DIAGNOSIS — F332 Major depressive disorder, recurrent severe without psychotic features: Secondary | ICD-10-CM

## 2021-02-16 LAB — POCT URINALYSIS DIP (CLINITEK)
Bilirubin, UA: NEGATIVE
Glucose, UA: NEGATIVE mg/dL
Ketones, POC UA: NEGATIVE mg/dL
Nitrite, UA: NEGATIVE
POC PROTEIN,UA: NEGATIVE
Spec Grav, UA: 1.01 (ref 1.010–1.025)
Urobilinogen, UA: 0.2 E.U./dL
pH, UA: 6 (ref 5.0–8.0)

## 2021-02-16 LAB — WET PREP FOR TRICH, YEAST, CLUE
MICRO NUMBER:: 11916020
Specimen Quality: ADEQUATE

## 2021-02-16 MED ORDER — METRONIDAZOLE 0.75 % VA GEL: 1.0000 | Freq: Every day | VAGINAL | 0 refills | Status: AC

## 2021-02-16 MED ORDER — CEPHALEXIN 500 MG PO CAPS: 500.0000 mg | ORAL_CAPSULE | Freq: Four times a day (QID) | ORAL | 0 refills | Status: AC

## 2021-02-16 MED ORDER — OMEPRAZOLE 40 MG PO CPDR: 40.0000 mg | DELAYED_RELEASE_CAPSULE | Freq: Two times a day (BID) | ORAL | 3 refills | Status: DC

## 2021-02-16 MED ORDER — VIIBRYD 40 MG PO TABS: 40.0000 mg | ORAL_TABLET | Freq: Every day | ORAL | 0 refills | Status: DC

## 2021-02-16 NOTE — Progress Notes (Addendum)
Acute Office Visit  Subjective:    Patient ID: Aimee Little, female    DOB: May 25, 1961, 60 y.o.   MRN: [id number]  Chief Complaint  Patient presents with  . Vaginal Pain    HPI Patient is in today for vaginal discomfort.   Patient has been having several days of vaginal itching, burning. She reports that she was on Augmentin for 10-12 days (tooth infection) and within 3 days of starting antibiotics her symptoms began. She reports external burning, itching around "upper private part and outside hole." She has not necessarily noticed an increase in the burning when she urinates. She has not noticed any vaginal discharge, but states she just feels very dry down there - history of vaginal dryness. Reports she has tried estradiol in the past but felt like it burned, although she would be willing to try again if workup is negative. She denies any suprapubic, back, abdominal, flank pain, rashes, STD history, fevers, malaise. She has tried re-cleanse, coconut oil, and hot compresses, which usually helps when she has dryness episodes although last one she can remember was several years ago and this time she is not responding well to home remedies like usual. She is not sexually active, ovaries have been removed.      Past Medical History:  Diagnosis Date  . DDD (degenerative disc disease), lumbar   . Depression   . Fibromyalgia   . Insomnia   . Migraine   . Thyroid disease   . Tobacco use     Past Surgical History:  Procedure Laterality Date  . ABDOMINAL HYSTERECTOMY     took uterus and both ovaries left    Family History  Problem Relation Age of Onset  . Depression Mother   . Hyperlipidemia Mother   . Hypertension Mother   . Heart attack Father   . Hypertension Sister   . Diabetes Sister   . Alcohol abuse Brother   . Hypertension Brother     Social History   Socioeconomic History  . Marital status: Married    Spouse name: Not on file  . Number of children: Not on file   . Years of education: Not on file  . Highest education level: Not on file  Occupational History  . Not on file  Tobacco Use  . Smoking status: Light Tobacco Smoker    Packs/day: 0.25    Last attempt to quit: 10/02/2013    Years since quitting: 7.3  . Smokeless tobacco: Never Used  Substance and Sexual Activity  . Alcohol use: No  . Drug use: No  . Sexual activity: Never  Other Topics Concern  . Not on file  Social History Narrative  . Not on file   Social Determinants of Health   Financial Resource Strain: Not on file  Food Insecurity: Not on file  Transportation Needs: Not on file  Physical Activity: Not on file  Stress: Not on file  Social Connections: Not on file  Intimate Partner Violence: Not on file    Outpatient Medications Prior to Visit  Medication Sig Dispense Refill  . ALPRAZolam (XANAX) 0.5 MG tablet Take 1 tablet (0.5 mg total) by mouth 2 (two) times daily as needed for anxiety. 30 tablet 1  . AMBULATORY NON FORMULARY MEDICATION Tens unit for lumbar spine for low back pain, muscle spasm, lumbar DDD. 1 Device 0  . amLODipine (NORVASC) 10 MG tablet Take 1 tablet (10 mg total) by mouth daily. 90 tablet 1  . Armodafinil 150  MG tablet Take 1 tablet (150 mg total) by mouth daily. 30 tablet 2  . busPIRone (BUSPAR) 15 MG tablet Take 1 tablet (15 mg total) by mouth 3 (three) times daily. 180 tablet 1  . CALCIUM-MAGNESIUM-ZINC PO Take by mouth daily.    . cholecalciferol (VITAMIN D3) 25 MCG (1000 UT) tablet Take 1,000 Units by mouth daily.    . cyclobenzaprine (FLEXERIL) 10 MG tablet Take 1 tablet (10 mg total) by mouth 3 (three) times daily as needed for muscle spasms. 180 tablet 1  . diclofenac sodium (VOLTAREN) 1 % GEL Apply 4 g topically 4 (four) times daily. To affected joint. (Patient taking differently: Apply 4 g topically 4 (four) times daily as needed. To affected joint.) 100 g 11  . famotidine (PEPCID) 20 MG tablet Take 1 tablet (20 mg total) by mouth 2 (two)  times daily. 180 tablet 1  . fluticasone (FLONASE) 50 MCG/ACT nasal spray Place 2 sprays into both nostrils daily. 16 g 12  . gabapentin (NEURONTIN) 300 MG capsule TAKE 1 CAPSULES BY MOUTH 3 TIMES A DAY AS NEEDED FOR PAIN/NEUROPATHY 270 capsule 4  . hydrochlorothiazide (HYDRODIURIL) 25 MG tablet Take 1 tablet (25 mg total) by mouth daily. 90 tablet 1  . HYDROcodone-acetaminophen (NORCO/VICODIN) 5-325 MG tablet Take one tablet twice daily PRN for moderate to severe pain. 60 tablet 0  . HYDROcodone-acetaminophen (NORCO/VICODIN) 5-325 MG tablet Take 1 tablet by mouth 2 (two) times daily as needed for moderate pain. 60 tablet 0  . HYDROcodone-acetaminophen (NORCO/VICODIN) 5-325 MG tablet Take 1 tablet by mouth 2 (two) times daily as needed for moderate pain. 60 tablet 0  . levothyroxine (SYNTHROID) 150 MCG tablet Take 1 tablet (150 mcg) by mouth Sun, Mon, Wed, Fri, Sat. Take 1.5 tablets (225 mcg) Tuesday and Thursday. Take on empty stomach at least 30 minutes prior to food or other medications. 108 tablet 1  . meloxicam (MOBIC) 15 MG tablet Take 1 tablet (15 mg total) by mouth daily. 90 tablet 1  . metoCLOPramide (REGLAN) 10 MG tablet Take 1 tablet (10 mg total) by mouth 4 (four) times daily - before meals and at bedtime. 120 tablet 0  . Misc. Devices MISC Initiate Bipap 8/4 cm to 16/12cm water pressure. New mask and supplies through DME [facility].    . Omega 3-6-9 Fatty Acids (OMEGA 3-6-9 COMPLEX PO) Take 1 capsule by mouth daily.     Marland Kitchen omeprazole (PRILOSEC) 40 MG capsule Take 1 capsule (40 mg total) by mouth in the morning and at bedtime. Can go down to one a day after 2 weeks. 180 capsule 3  . predniSONE (DELTASONE) 50 MG tablet Take 1 tablet (50 mg total) by mouth daily with breakfast. 5 tablet 0  . rizatriptan (MAXALT-MLT) 10 MG disintegrating tablet May repeat in 2 hours if needed for migraine 30 tablet 1  . rOPINIRole (REQUIP) 1 MG tablet TAKE ONE TABLET BY MOUTH 3 TIMES A DAY. 270 tablet 1  .  Semaglutide-Weight Management (WEGOVY) 0.25 MG/0.5ML SOAJ Inject 0.25 mg into the skin once a week. 2 mL 0  . topiramate (TOPAMAX) 100 MG tablet Take one tablet twice a day. 180 tablet 1  . traZODone (DESYREL) 100 MG tablet Take one tablet before bed. (Patient taking differently: Take 100 mg by mouth at bedtime as needed.) 90 tablet 1  . valACYclovir (VALTREX) 1000 MG tablet TAKE TWO TABLETS 2 TIMES A DAY AT ONSET OF COLD SORE FOR 2 DAYS 60 tablet 0  . VIIBRYD 40  MG TABS Take 1 tablet (40 mg total) by mouth daily. 90 tablet 0   No facility-administered medications prior to visit.    Allergies  Allergen Reactions  . Doxepin Other (See Comments)  . Pristiq [Desvenlafaxine Succinate Er]     Depression worse.  . Estradiol Other (See Comments)    Burning   . Gabapentin     Sleep walking  . Levothyroxine     Other reaction(s): Other (See Comments) Reaction unknown  . Lexapro [Escitalopram Oxalate]     jittery  . Lisinopril     cough  . Losartan     itching  . Mirapex [Pramipexole Dihydrochloride]     Nausea and vomiting  . Phentermine Diarrhea    Stomach ache/increased anxiety.   . Pregabalin   . Pristiq [Desvenlafaxine] Other (See Comments)    Increased depression  . Seroquel [Quetiapine Fumarate]     hallicinations  . Trintellix [Vortioxetine]     RLS/increased anxiety    Review of Systems All review of systems negative except what is listed in the HPI     Objective:    Physical Exam Constitutional:      Appearance: Normal appearance.  Genitourinary:    Comments: Vulvar erythema and dryness, no lesions, mild amount of milky discharge in vagina, no obvious odor Skin:    General: Skin is warm and dry.  Neurological:     General: No focal deficit present.     Mental Status: She is alert and oriented to person, place, and time.  Psychiatric:        Mood and Affect: Mood normal.        Behavior: Behavior normal.        Thought Content: Thought content normal.         Judgment: Judgment normal.     BP 128/73 (BP Location: Left Arm, Patient Position: Sitting, Cuff Size: Large)   Pulse (!) 106   Temp 98.2 F (36.8 C)   Ht 5' 7.72" (1.72 m)   Wt 214 lb 12.8 oz (97.4 kg)   SpO2 96%   BMI 32.93 kg/m  Wt Readings from Last 3 Encounters:  02/16/21 214 lb 12.8 oz (97.4 kg)  10/27/20 235 lb (106.6 kg)  10/25/20 235 lb (106.6 kg)    Health Maintenance Due  Topic Date Due  . HIV Screening  Never done    There are no preventive care reminders to display for this patient.   Lab Results  Component Value Date   TSH 0.56 09/01/2020   Lab Results  Component Value Date   WBC 11.0 (H) 09/01/2020   HGB 17.4 (H) 09/01/2020   HCT 50.3 (H) 09/01/2020   MCV 94.0 09/01/2020   PLT 325 09/01/2020   Lab Results  Component Value Date   NA 138 09/01/2020   K 3.8 09/01/2020   CO2 21 09/01/2020   GLUCOSE 109 09/01/2020   BUN 22 09/01/2020   CREATININE 1.01 (H) 09/01/2020   BILITOT 0.6 09/01/2020   ALKPHOS 78 12/06/2016   AST 16 09/01/2020   ALT 27 09/01/2020   PROT 7.3 09/01/2020   ALBUMIN 3.9 12/06/2016   CALCIUM 10.0 09/01/2020   Lab Results  Component Value Date   CHOL 225 (H) 01/19/2020   Lab Results  Component Value Date   HDL 58 01/19/2020   Lab Results  Component Value Date   LDLCALC 142 (H) 01/19/2020   Lab Results  Component Value Date   TRIG 127 01/19/2020   Lab  Results  Component Value Date   CHOLHDL 3.9 01/19/2020   No results found for: HGBA1C     Assessment & Plan:   1. Vaginal burning 2. Vulvar dermatitis Wet prep sent today. External/vulvar region with erythema/dryness, but vagina was not excessively dry and did have a mild amount of milky discharge. Will update patient with wet prep results and make changes as indicated. If negative, will likely still give a dose of Diflucan for possible external yeast (recent ABX use)  and some topical corticosteroids for vulvar dermatitis. If recurrent, can consider estradiol  for vaginal dryness - she didn't like in the past, but is willing to try again if other options fail.   - POCT URINALYSIS DIP (CLINITEK) - Urine Culture - WET PREP FOR TRICH, YEAST, CLUE  3. Abnormal urinalysis Moderate leuks on UA - will go ahead and send in Keflex (multiple allergies) and order urine culture. If culture indicates, will make appropriate changes to regimen. Encouraged good hygiene.   - Urine Culture - cephALEXin (KEFLEX) 500 MG capsule; Take 1 capsule (500 mg total) by mouth 4 (four) times daily for 5 days.  Dispense: 20 capsule; Refill: 0 - WET PREP FOR TRICH, YEAST, CLUE  Educated on signs and symptoms requiring further evaluation. Follow-up if symptoms worsen or fail to improve.   **Addendum - wet prep was only positive for BV. Sending in metronidazole vaginal gel.    Terrilyn Saver, NP

## 2021-02-16 NOTE — Progress Notes (Signed)
Please call patient - Your vaginal swab was positive for bacterial vaginosis. I am sending in an antibiotic gel that you will apply to your vagina for the next 5 nights. If you are still having symptoms after this finishes, please let us know.

## 2021-02-16 NOTE — Addendum Note (Signed)
Addended by: Caleen Jobs B on: 02/16/2021 01:01 PM   Modules accepted: Orders

## 2021-02-18 LAB — URINE CULTURE
MICRO NUMBER:: 11918574
SPECIMEN QUALITY:: ADEQUATE

## 2021-02-19 MED ORDER — CIPROFLOXACIN HCL 250 MG PO TABS: 250.0000 mg | ORAL_TABLET | Freq: Two times a day (BID) | ORAL | 0 refills | Status: AC

## 2021-02-19 NOTE — Addendum Note (Signed)
Addended by: Caleen Jobs B on: 02/19/2021 07:59 AM   Modules accepted: Orders

## 2021-02-19 NOTE — Progress Notes (Addendum)
Please call patient - can you ask if she is allergic to Ciprofloxacin (antibiotic)? Epic is flagging it as an allergy because of her seroquel allergy, but it looks like she took cipro in 2020? If she tolerated well, then I will switch her UTI antibiotic to cipro, because it is resistant to the previous antibiotic I gave her.   **Addendum - pt states she has tolerated cipro well in the past. Will send in for UTI based on culture results.

## 2021-02-19 NOTE — Addendum Note (Signed)
Addended by: Caleen Jobs B on: 02/19/2021 09:26 AM   Modules accepted: Orders

## 2021-02-23 ENCOUNTER — Other Ambulatory Visit: Payer: Self-pay | Admitting: Physician Assistant

## 2021-02-23 DIAGNOSIS — G2581 Restless legs syndrome: Secondary | ICD-10-CM

## 2021-02-27 ENCOUNTER — Other Ambulatory Visit: Payer: Self-pay

## 2021-02-27 ENCOUNTER — Ambulatory Visit (INDEPENDENT_AMBULATORY_CARE_PROVIDER_SITE_OTHER): Payer: Medicare Other | Admitting: Physician Assistant

## 2021-02-27 VITALS — BP 106/81 | HR 102 | Temp 98.7°F | Ht 67.0 in | Wt 216.0 lb

## 2021-02-27 DIAGNOSIS — G2581 Restless legs syndrome: Secondary | ICD-10-CM

## 2021-02-27 DIAGNOSIS — M545 Low back pain, unspecified: Secondary | ICD-10-CM | POA: Diagnosis not present

## 2021-02-27 DIAGNOSIS — I10 Essential (primary) hypertension: Secondary | ICD-10-CM

## 2021-02-27 DIAGNOSIS — N949 Unspecified condition associated with female genital organs and menstrual cycle: Secondary | ICD-10-CM | POA: Diagnosis not present

## 2021-02-27 DIAGNOSIS — N760 Acute vaginitis: Secondary | ICD-10-CM | POA: Diagnosis not present

## 2021-02-27 DIAGNOSIS — N898 Other specified noninflammatory disorders of vagina: Secondary | ICD-10-CM | POA: Diagnosis not present

## 2021-02-27 DIAGNOSIS — E039 Hypothyroidism, unspecified: Secondary | ICD-10-CM

## 2021-02-27 DIAGNOSIS — M47814 Spondylosis without myelopathy or radiculopathy, thoracic region: Secondary | ICD-10-CM | POA: Diagnosis not present

## 2021-02-27 DIAGNOSIS — F332 Major depressive disorder, recurrent severe without psychotic features: Secondary | ICD-10-CM

## 2021-02-27 DIAGNOSIS — N9489 Other specified conditions associated with female genital organs and menstrual cycle: Secondary | ICD-10-CM

## 2021-02-27 DIAGNOSIS — G8929 Other chronic pain: Secondary | ICD-10-CM | POA: Diagnosis not present

## 2021-02-27 DIAGNOSIS — R5382 Chronic fatigue, unspecified: Secondary | ICD-10-CM | POA: Diagnosis not present

## 2021-02-27 DIAGNOSIS — G43909 Migraine, unspecified, not intractable, without status migrainosus: Secondary | ICD-10-CM

## 2021-02-27 LAB — POCT URINALYSIS DIP (CLINITEK)
Bilirubin, UA: NEGATIVE
Blood, UA: NEGATIVE
Glucose, UA: NEGATIVE mg/dL
Leukocytes, UA: NEGATIVE
Nitrite, UA: NEGATIVE
POC PROTEIN,UA: NEGATIVE
Spec Grav, UA: 1.02 (ref 1.010–1.025)
Urobilinogen, UA: 0.2 E.U./dL
pH, UA: 6 (ref 5.0–8.0)

## 2021-02-27 LAB — THYROXINE (T4) FREE, DIRECT
PTH, Intact: 70.4
T3, Total: 77.81
T4, FREE (REFL): 1.15

## 2021-02-27 MED ORDER — LEVOTHYROXINE SODIUM 150 MCG PO TABS: ORAL_TABLET | ORAL | 1 refills | Status: DC

## 2021-02-27 MED ORDER — TOPIRAMATE 100 MG PO TABS: ORAL_TABLET | ORAL | 1 refills | Status: DC

## 2021-02-27 MED ORDER — VIIBRYD 40 MG PO TABS
40.0000 mg | ORAL_TABLET | Freq: Every day | ORAL | 1 refills | Status: DC
Start: 2021-02-27 — End: 2021-09-28

## 2021-02-27 MED ORDER — HYDROCHLOROTHIAZIDE 25 MG PO TABS: 25.0000 mg | ORAL_TABLET | Freq: Every day | ORAL | 1 refills | Status: DC

## 2021-02-27 MED ORDER — FLUCONAZOLE 150 MG PO TABS
150.0000 mg | ORAL_TABLET | Freq: Once | ORAL | 0 refills | Status: AC
Start: 2021-02-27 — End: 2021-02-27

## 2021-02-27 MED ORDER — AMLODIPINE BESYLATE 10 MG PO TABS: 10.0000 mg | ORAL_TABLET | Freq: Every day | ORAL | 1 refills | Status: DC

## 2021-02-27 MED ORDER — ROPINIROLE HCL 1 MG PO TABS: 1.0000 mg | ORAL_TABLET | Freq: Three times a day (TID) | ORAL | 1 refills | Status: DC

## 2021-02-27 NOTE — Patient Instructions (Signed)
Vaginitis  Vaginitis is irritation and swelling of the vagina. Treatment will depend on the cause. What are the causes? It can be caused by:  Bacteria.  Yeast.  A parasite.  A virus.  Low hormone levels.  Bubble baths, scented tampons, and feminine sprays. Other things can change the balance of the yeast and bacteria that live in the vagina. These include:  Antibiotic medicines.  Not being clean enough.  Some birth control methods.  Sex.  Infection.  Diabetes.  A weakened body defense system (immune system). What increases the risk?  Smoking or being around someone who smokes.  Using washes (douches), scented tampons, or scented pads.  Wearing tight pants or thong underwear.  Using birth control pills or an IUD.  Having sex without a condom or having a lot of partners.  Having an STI.  Using a certain product to kill sperm (nonoxynol-9).  Eating foods that are high in sugar.  Having diabetes.  Having low levels of a female hormone.  Having a weakened body defense system.  Being pregnant or breastfeeding. What are the signs or symptoms?  Fluid coming from the vagina that is not normal.  A bad smell.  Itching, pain, or swelling.  Pain with sex.  Pain or burning when you pee (urinate). Sometimes there are no symptoms. How is this treated? Treatment may include:  Antibiotic creams or pills.  Antifungal medicines.  Medicines to ease symptoms if you have a virus. Your sex partner should also be treated.  Estrogen medicines.  Avoiding scented soaps, sprays, or douches.  Stopping use of products that caused irritation and then using a cream to treat symptoms. Follow these instructions at home: Lifestyle  Keep the area around your vagina clean and dry. ? Avoid using soap. ? Rinse the area with water.  Until your doctor says it is okay: ? Do not use washes for the vagina. ? Do not use tampons. ? Do not have sex.  Wipe from front to  back after going to the bathroom.  When your doctor says it is okay, practice safe sex and use condoms. General instructions  Take over-the-counter and prescription medicines only as told by your doctor.  If you were prescribed an antibiotic medicine, take or use it as told by your doctor. Do not stop taking or using it even if you start to feel better.  Keep all follow-up visits. How is this prevented?  Do not use things that can irritate the vagina, such as fabric softeners. Avoid these products if they are scented: ? Sprays. ? Detergents. ? Tampons. ? Products for cleaning the vagina. ? Soaps or bubble baths.  Let air reach your vagina. To do this: ? Wear cotton underwear. ? Do not wear:  Underwear while you sleep.  Tight pants.  Thong underwear.  Underwear or nylons without a cotton panel. ? Take off any wet clothing, such as bathing suits, as soon as you can. ? Practice safe sex and use condoms. Contact a doctor if:  You have pain in your belly or in the area between your hips.  You have a fever or chills.  Your symptoms last for more than 2-3 days. Get help right away if:  You have a fever and your symptoms get worse all of a sudden. Summary  Vaginitis is irritation and swelling of the vagina.  Treatment will depend on the cause of the condition.  Do not use washes or tampons or have sex until your doctor says it   is okay. This information is not intended to replace advice given to you by your health care provider. Make sure you discuss any questions you have with your health care provider. Document Revised: 03/16/2020 Document Reviewed: 03/16/2020 Elsevier Patient Education  [place].

## 2021-02-27 NOTE — Progress Notes (Signed)
Subjective:    Patient ID: Aimee Little, female    DOB: October 09, 1960, 60 y.o.   MRN: [id number]  HPI  Pt is a 60 yo obese female who presents to the clinic with vaginal itching and irritation for the last 3 weeks. She saw Aimee Jobs NP on 5/20 and treated for UTI, BV with keflex, cipro, metronidazole cream. She is feeling some better but itching and discomfort are bothering her a lot. She has finished all her antibiotics. Before all this started she was also on antibiotics for her teeth. No vaginal discharge. No urinary symptoms.   Pt is doing really well her mood and chronic pain is much better.  Her migraines are controlled.  .. Active Ambulatory Problems    Diagnosis Date Noted  . HOT FLASHES 12/29/2009  . [place] DISEASE, LUMBAR 12/29/2009  . Fibromyalgia 01/01/2013  . Unspecified vitamin D deficiency 05/16/2013  . Migraine without status migrainosus, not intractable 07/21/2013  . Chronic pain syndrome 11/05/2013  . Anxiety and depression 12/17/2013  . Adenomatous colon polyp 12/20/2013  . Obesity 11/25/2014  . Chronic fatigue 05/26/2015  . RLS (restless legs syndrome) 09/22/2015  . Hypothyroid 10/27/2015  . Insomnia 11/21/2015  . Severe episode of recurrent major depressive disorder, without psychotic features ([place]) 11/29/2015  . Rhinitis, allergic 11/29/2015  . Vaginal atrophy 01/02/2016  . Generalized anxiety disorder 03/19/2016  . Panic attacks 03/19/2016  . Loose body in knee 04/17/2016  . Chondromalacia of patellofemoral joint 04/17/2016  . Diverticulitis of colon without hemorrhage 07/19/2016  . Diverticulitis 09/10/2017  . Light tobacco smoker 10/01/2017  . Essential hypertension 10/01/2017  . DDD (degenerative disc disease), lumbar 04/01/2018  . OSA on CPAP 05/29/2018  . Chronic bilateral thoracic back pain 08/04/2018  . Osteopenia 03/24/2019  . Chronic bilateral low back pain with bilateral sciatica 12/13/2019  . DDD (degenerative disc disease), thoracic  01/04/2020  . Mixed hyperlipidemia 01/20/2020  . Bilateral lower extremity edema 09/01/2020  . Abnormal ECG 11/20/2020  . Hypertension goal BP (blood pressure) < 130/80 02/28/2021   Resolved Ambulatory Problems    Diagnosis Date Noted  . Unspecified hypothyroidism 12/29/2009  . TOBACCO ABUSE 06/13/2010  . GRIEF REACTION, ACUTE 12/29/2009  . Depression 02/06/2010  . Osteoarthrosis involving lower leg 12/29/2009  . NAUSEA AND VOMITING 05/02/2010  . Osteoarthritis of left knee 07/21/2013  . Medication management 11/05/2013  . Nausea with vomiting 04/07/2014  . Lumbago 11/25/2014  . Sinusitis 04/13/2015  . Sacroiliac joint dysfunction of both sides 09/06/2015  . Bilateral low back pain without sciatica 09/06/2015  . Right knee pain 11/07/2015  . Left knee pain 01/23/2016  . Abdominal pain, left lower quadrant 04/25/2016  . Trochanteric bursitis of left hip 01/29/2017  . Nonintractable headache 03/06/2017  . Tachycardia 03/06/2017  . Rib pain on left side 08/03/2017  . Elevated blood pressure reading 10/01/2017  . Rib pain on right side 11/04/2017  . Traumatic hematoma of eyelid, right, initial encounter 11/04/2017  . Low grade fever 04/01/2018  . Acute bilateral low back pain with bilateral sciatica 04/01/2018  . Closed fracture of one rib of right side 12/01/2018  . Upper back pain 11/30/2019  . Mid back pain 12/13/2019  . Muscle spasm of back 12/13/2019  . Chest pain 09/01/2020  . Epigastric pain 09/01/2020   Past Medical History:  Diagnosis Date  . Migraine   . Thyroid disease   . Tobacco use      Review of Systems See HPI.  Objective:   Physical Exam Genitourinary:    Rectum: Normal.     Comments: Vulva erythematous with white clumpy discharge.  No active vaginal discharge.  Tenderness and irritation to collection of sample.     .. Results for orders placed or performed in visit on 02/27/21  WET PREP FOR [place], YEAST, CLUE   Specimen: GYN  Result  Value Ref Range   MICRO NUMBER: [id number]    Specimen Quality Adequate    SOURCE: NOT GIVEN    Status FINAL    RESULT yeast (A)   VITAMIN D 25 Hydroxy (Vit-D Deficiency, Fractures)  Result Value Ref Range   Vit D, 25-Hydroxy 64.99   Lipid panel  Result Value Ref Range   Triglycerides 137 40 - 160   Cholesterol 182 0 - 200   HDL 53 35 - 70   LDL Cholesterol 102   Hemoglobin A1c  Result Value Ref Range   Hemoglobin A1C 4.6   TSH  Result Value Ref Range   TSH 3.82 0.41 - 5.90  CBC and differential  Result Value Ref Range   Hemoglobin 16.3 (A) 12.0 - 16.0   HCT 45 36 - 46   Platelets 297 150 - 399   WBC 11.1   CBC  Result Value Ref Range   RBC 4.82 3.87 - 4.26  Basic metabolic panel  Result Value Ref Range   Glucose 90    BUN 14 4 - 21   CO2 23 (A) 13 - 22   Creatinine 0.9 0.5 - 1.1   Potassium 3.4 3.4 - 5.3   Sodium 141 137 - 147   Chloride 105 99 - 108  Comprehensive metabolic panel  Result Value Ref Range   Globulin 2.2    GFR calc Af Amer 81    GFR calc non Af Amer 67    Albumin 4.5 3.5 - 5.0  Hepatic function panel  Result Value Ref Range   Alkaline Phosphatase 71 25 - 125   ALT 17 7 - 35   AST 13 13 - 35   Bilirubin, Total 0.4   Thyroxine (T4) Free, Direct  Result Value Ref Range   T4, FREE (REFL) 1.15    PTH, Intact 70.40    T3, Total 77.81   POCT URINALYSIS DIP (CLINITEK)  Result Value Ref Range   Color, UA yellow yellow   Clarity, UA clear clear   Glucose, UA negative negative mg/dL   Bilirubin, UA negative negative   Ketones, POC UA trace (5) (A) negative mg/dL   Spec Grav, UA 1.020 1.010 - 1.025   Blood, UA negative negative   pH, UA 6.0 5.0 - 8.0   POC PROTEIN,UA negative negative, trace   Urobilinogen, UA 0.2 0.2 or 1.0 E.U./dL   Nitrite, UA Negative Negative   Leukocytes, UA Negative Negative         Assessment & Plan:  Marland KitchenMarland KitchenMelissa was seen today for vaginal itching.  Diagnoses and all orders for this visit:  Acute vaginitis -      fluconazole (DIFLUCAN) 150 MG tablet; Take 1 tablet (150 mg total) by mouth once for 1 dose. Repeat in 72 hours x 2.  Vaginal itching -     POCT URINALYSIS DIP (CLINITEK) -     WET PREP FOR TRICH, YEAST, CLUE  Vaginal burning -     POCT URINALYSIS DIP (CLINITEK) -     WET PREP FOR TRICH, YEAST, CLUE  Hypertension goal BP (blood pressure) < 130/80 -  amLODipine (NORVASC) 10 MG tablet; Take 1 tablet (10 mg total) by mouth daily. -     hydrochlorothiazide (HYDRODIURIL) 25 MG tablet; Take 1 tablet (25 mg total) by mouth daily.  Hypothyroidism, unspecified type -     levothyroxine (SYNTHROID) 150 MCG tablet; Take 1 tablet (150 mcg) by mouth Sun, Mon, Wed, Fri, Sat. Take 1.5 tablets (225 mcg) Tuesday and Thursday. Take on empty stomach at least 30 minutes prior to food or other medications.  RLS (restless legs syndrome) -     rOPINIRole (REQUIP) 1 MG tablet; Take 1 tablet (1 mg total) by mouth 3 (three) times daily.  Migraine without status migrainosus, not intractable, unspecified migraine type -     topiramate (TOPAMAX) 100 MG tablet; Take one tablet twice a day.  Severe episode of recurrent major depressive disorder, without psychotic features ([facility]) -     Vilazodone HCl (VIIBRYD) 40 MG TABS; Take 1 tablet (40 mg total) by mouth daily.   Pt needed some refills on medication. Sent to pharmacy.   Pelvic exam today showed signs of yeast. Wet prep done. Start treatment for yeast with diflucan.  Wet prep confirmed yeast and no clue cells.  UA negative for blood, leuks, nitrites.  Follow up as needed or if symptoms persist.

## 2021-02-28 ENCOUNTER — Encounter: Payer: Self-pay | Admitting: Physician Assistant

## 2021-02-28 DIAGNOSIS — I10 Essential (primary) hypertension: Secondary | ICD-10-CM | POA: Insufficient documentation

## 2021-02-28 LAB — WET PREP FOR TRICH, YEAST, CLUE
MICRO NUMBER:: 11949954
Specimen Quality: ADEQUATE

## 2021-02-28 NOTE — Progress Notes (Signed)
Confirmed yeast. Finish the diflucan treatment and let me know how you are doing.

## 2021-03-07 DIAGNOSIS — M5416 Radiculopathy, lumbar region: Secondary | ICD-10-CM | POA: Diagnosis not present

## 2021-03-13 ENCOUNTER — Other Ambulatory Visit: Payer: Self-pay

## 2021-03-13 ENCOUNTER — Encounter: Payer: Self-pay | Admitting: Physician Assistant

## 2021-03-13 ENCOUNTER — Ambulatory Visit (INDEPENDENT_AMBULATORY_CARE_PROVIDER_SITE_OTHER): Payer: Medicare Other | Admitting: Physician Assistant

## 2021-03-13 VITALS — BP 128/86 | HR 115 | Ht 67.0 in | Wt 209.0 lb

## 2021-03-13 DIAGNOSIS — F17200 Nicotine dependence, unspecified, uncomplicated: Secondary | ICD-10-CM | POA: Diagnosis not present

## 2021-03-13 DIAGNOSIS — F332 Major depressive disorder, recurrent severe without psychotic features: Secondary | ICD-10-CM

## 2021-03-13 DIAGNOSIS — Z716 Tobacco abuse counseling: Secondary | ICD-10-CM | POA: Diagnosis not present

## 2021-03-13 DIAGNOSIS — F410 Panic disorder [episodic paroxysmal anxiety] without agoraphobia: Secondary | ICD-10-CM

## 2021-03-13 DIAGNOSIS — F411 Generalized anxiety disorder: Secondary | ICD-10-CM

## 2021-03-13 MED ORDER — NICOTINE 14 MG/24HR TD PT24: 14.0000 mg | MEDICATED_PATCH | Freq: Every day | TRANSDERMAL | 1 refills | Status: DC

## 2021-03-13 MED ORDER — NICOTINE 21 MG/24HR TD PT24
21.0000 mg | MEDICATED_PATCH | Freq: Every day | TRANSDERMAL | 1 refills | Status: DC
Start: 2021-03-13 — End: 2021-06-05

## 2021-03-13 MED ORDER — LAMOTRIGINE 25 MG PO TABS: 25.0000 mg | ORAL_TABLET | Freq: Every day | ORAL | 0 refills | Status: DC

## 2021-03-13 NOTE — Progress Notes (Signed)
Subjective:    Patient ID: Aimee Little, female    DOB: 14-Jan-1961, 60 y.o.   MRN: [id number]  HPI Pt is a 60 yo obese female with MDD, GAD, chronic pain, current smoker who presents to the clinic to discuss worsening mood. She is really anxious about the world, money, shootings and just feels overwhelmed. She was doing well just last month but just hit all of a sudden. No SI/HC.   She really wants to stop smoking but feels like she can't do it alone. Wellbutrin made her anxiety worse.   .. Active Ambulatory Problems    Diagnosis Date Noted   HOT FLASHES 12/29/2009   [place] DISEASE, LUMBAR 12/29/2009   Fibromyalgia 01/01/2013   Unspecified vitamin D deficiency 05/16/2013   Migraine without status migrainosus, not intractable 07/21/2013   Chronic pain syndrome 11/05/2013   Anxiety and depression 12/17/2013   Adenomatous colon polyp 12/20/2013   Obesity 11/25/2014   Chronic fatigue 05/26/2015   RLS (restless legs syndrome) 09/22/2015   Hypothyroid 10/27/2015   Insomnia 11/21/2015   Severe episode of recurrent major depressive disorder, without psychotic features ([place]) 11/29/2015   Rhinitis, allergic 11/29/2015   Vaginal atrophy 01/02/2016   Generalized anxiety disorder 03/19/2016   Panic attacks 03/19/2016   Loose body in knee 04/17/2016   Chondromalacia of patellofemoral joint 04/17/2016   Diverticulitis of colon without hemorrhage 07/19/2016   Diverticulitis 09/10/2017   Light tobacco smoker 10/01/2017   Essential hypertension 10/01/2017   DDD (degenerative disc disease), lumbar 04/01/2018   OSA on CPAP 05/29/2018   Chronic bilateral thoracic back pain 08/04/2018   Osteopenia 03/24/2019   Chronic bilateral low back pain with bilateral sciatica 12/13/2019   DDD (degenerative disc disease), thoracic 01/04/2020   Mixed hyperlipidemia 01/20/2020   Bilateral lower extremity edema 09/01/2020   Abnormal ECG 11/20/2020   Hypertension goal BP (blood pressure) < 130/80 02/28/2021    Resolved Ambulatory Problems    Diagnosis Date Noted   Unspecified hypothyroidism 12/29/2009   TOBACCO ABUSE 06/13/2010   GRIEF REACTION, ACUTE 12/29/2009   Depression 02/06/2010   Osteoarthrosis involving lower leg 12/29/2009   NAUSEA AND VOMITING 05/02/2010   Osteoarthritis of left knee 07/21/2013   Medication management 11/05/2013   Nausea with vomiting 04/07/2014   Lumbago 11/25/2014   Sinusitis 04/13/2015   Sacroiliac joint dysfunction of both sides 09/06/2015   Bilateral low back pain without sciatica 09/06/2015   Right knee pain 11/07/2015   Left knee pain 01/23/2016   Abdominal pain, left lower quadrant 04/25/2016   Trochanteric bursitis of left hip 01/29/2017   Nonintractable headache 03/06/2017   Tachycardia 03/06/2017   Rib pain on left side 08/03/2017   Elevated blood pressure reading 10/01/2017   Rib pain on right side 11/04/2017   Traumatic hematoma of eyelid, right, initial encounter 11/04/2017   Low grade fever 04/01/2018   Acute bilateral low back pain with bilateral sciatica 04/01/2018   Closed fracture of one rib of right side 12/01/2018   Upper back pain 11/30/2019   Mid back pain 12/13/2019   Muscle spasm of back 12/13/2019   Chest pain 09/01/2020   Epigastric pain 09/01/2020   Past Medical History:  Diagnosis Date   Migraine    Thyroid disease    Tobacco use      Review of Systems See HPI.     Objective:   Physical Exam Vitals reviewed.  Constitutional:      Appearance: Normal appearance. She is obese.  HENT:  Head: Normocephalic.  Cardiovascular:     Rate and Rhythm: Normal rate and regular rhythm.     Pulses: Normal pulses.  Pulmonary:     Effort: Pulmonary effort is normal.     Breath sounds: Normal breath sounds.  Neurological:     General: No focal deficit present.     Mental Status: She is alert and oriented to person, place, and time.  Psychiatric:     Comments: Anxious/tearful         .Marland Kitchen Depression screen [facility]  2/9 03/13/2021 12/08/2020 10/25/2020 06/01/2020 04/14/2020  Decreased Interest 3 1 1 3 3   Down, Depressed, Hopeless 3 1 1 2 1   PHQ - 2 Score 6 2 2 5 4   Altered sleeping 3 2 1 3  0  Tired, decreased energy 3 2 2 3 3   Change in appetite 3 2 2  0 0  Feeling bad or failure about yourself  3 0 0 0 1  Trouble concentrating 3 2 1 3 1   Moving slowly or fidgety/restless 3 0 0 3 0  Suicidal thoughts 2 0 0 0 0  PHQ-9 Score 26 10 8 17 9   Difficult doing work/chores Extremely dIfficult Very difficult Somewhat difficult Extremely dIfficult Very difficult  Some recent data might be hidden   .Marland Kitchen GAD 7 : Generalized Anxiety Score 03/13/2021 12/08/2020 10/25/2020 06/01/2020  Nervous, Anxious, on Edge 3 2 1 3   Control/stop worrying 3 1 1 3   Worry too much - different things 3 1 1 3   Trouble relaxing 3 2 1 3   Restless 3 2 1 3   Easily annoyed or irritable 0 0 0 2  Afraid - awful might happen 3 1 1 3   Total GAD 7 Score 18 9 6 20   Anxiety Difficulty Extremely difficult Somewhat difficult Somewhat difficult Very difficult     Assessment & Plan:  Marland KitchenMarland KitchenMelissa was seen today for depression.  Diagnoses and all orders for this visit:  Severe episode of recurrent major depressive disorder, without psychotic features ([place]) -     lamoTRIgine (LAMICTAL) 25 MG tablet; Take 1 tablet (25 mg total) by mouth daily.  Tobacco dependence -     nicotine (NICODERM CQ) 21 mg/24hr patch; Place 1 patch (21 mg total) onto the skin daily. -     nicotine (NICODERM CQ) 14 mg/24hr patch; Place 1 patch (14 mg total) onto the skin daily.  Encounter for smoking cessation counseling  Generalized anxiety disorder  Panic attacks  Added lamictal to viibryd. Discussed ways to combat anxiety and mood. She cannot afford counseling. Follow up in 4 weeks.   Stopping smoking could cause anxiety to increase. Chantix off market. Failed wellbutrin. Discussed patches with slow taper.   Spent 30 minutes with patient discussing mood.

## 2021-03-15 ENCOUNTER — Encounter: Payer: Self-pay | Admitting: Physician Assistant

## 2021-03-19 ENCOUNTER — Other Ambulatory Visit: Payer: Self-pay | Admitting: Physician Assistant

## 2021-03-19 DIAGNOSIS — K2100 Gastro-esophageal reflux disease with esophagitis, without bleeding: Secondary | ICD-10-CM

## 2021-03-23 DIAGNOSIS — G8929 Other chronic pain: Secondary | ICD-10-CM | POA: Diagnosis not present

## 2021-03-23 DIAGNOSIS — M545 Low back pain, unspecified: Secondary | ICD-10-CM | POA: Diagnosis not present

## 2021-03-23 DIAGNOSIS — Z79899 Other long term (current) drug therapy: Secondary | ICD-10-CM | POA: Diagnosis not present

## 2021-03-23 DIAGNOSIS — R5382 Chronic fatigue, unspecified: Secondary | ICD-10-CM | POA: Diagnosis not present

## 2021-03-23 DIAGNOSIS — M47814 Spondylosis without myelopathy or radiculopathy, thoracic region: Secondary | ICD-10-CM | POA: Diagnosis not present

## 2021-03-30 ENCOUNTER — Telehealth: Payer: Self-pay | Admitting: Physician Assistant

## 2021-03-30 NOTE — Telephone Encounter (Signed)
[place] for jury duty letter.

## 2021-03-30 NOTE — Telephone Encounter (Signed)
Letter written and sent to patient via mychart.

## 2021-03-30 NOTE — Telephone Encounter (Signed)
Patient calling in stating she got a notice for jury duty and states she cant due to depression and restless leg syndrome. She stated other things. This is for august. Please advise whats best to do to get a letter to dismiss this.

## 2021-04-13 DIAGNOSIS — Z79899 Other long term (current) drug therapy: Secondary | ICD-10-CM | POA: Diagnosis not present

## 2021-04-13 DIAGNOSIS — M47814 Spondylosis without myelopathy or radiculopathy, thoracic region: Secondary | ICD-10-CM | POA: Diagnosis not present

## 2021-04-13 DIAGNOSIS — M545 Low back pain, unspecified: Secondary | ICD-10-CM | POA: Diagnosis not present

## 2021-04-13 DIAGNOSIS — G8929 Other chronic pain: Secondary | ICD-10-CM | POA: Diagnosis not present

## 2021-04-13 DIAGNOSIS — R5382 Chronic fatigue, unspecified: Secondary | ICD-10-CM | POA: Diagnosis not present

## 2021-05-03 DIAGNOSIS — M47814 Spondylosis without myelopathy or radiculopathy, thoracic region: Secondary | ICD-10-CM | POA: Diagnosis not present

## 2021-05-03 DIAGNOSIS — G8929 Other chronic pain: Secondary | ICD-10-CM | POA: Diagnosis not present

## 2021-05-03 DIAGNOSIS — R5382 Chronic fatigue, unspecified: Secondary | ICD-10-CM | POA: Diagnosis not present

## 2021-05-03 DIAGNOSIS — M545 Low back pain, unspecified: Secondary | ICD-10-CM | POA: Diagnosis not present

## 2021-05-03 DIAGNOSIS — Z79899 Other long term (current) drug therapy: Secondary | ICD-10-CM | POA: Diagnosis not present

## 2021-05-08 ENCOUNTER — Other Ambulatory Visit: Payer: Self-pay | Admitting: Physician Assistant

## 2021-05-08 DIAGNOSIS — M62830 Muscle spasm of back: Secondary | ICD-10-CM

## 2021-05-08 DIAGNOSIS — G8929 Other chronic pain: Secondary | ICD-10-CM

## 2021-05-17 ENCOUNTER — Other Ambulatory Visit: Payer: Self-pay | Admitting: Physician Assistant

## 2021-05-17 DIAGNOSIS — F410 Panic disorder [episodic paroxysmal anxiety] without agoraphobia: Secondary | ICD-10-CM

## 2021-05-17 NOTE — Telephone Encounter (Signed)
Last written 12/05/2020 #30 with 1 refill Last appt 03/13/2021

## 2021-05-18 ENCOUNTER — Ambulatory Visit: Payer: Medicare Other | Admitting: Physician Assistant

## 2021-05-21 DIAGNOSIS — R9431 Abnormal electrocardiogram [ECG] [EKG]: Secondary | ICD-10-CM | POA: Diagnosis not present

## 2021-05-21 DIAGNOSIS — R072 Precordial pain: Secondary | ICD-10-CM | POA: Diagnosis not present

## 2021-05-21 DIAGNOSIS — I10 Essential (primary) hypertension: Secondary | ICD-10-CM | POA: Diagnosis not present

## 2021-05-23 ENCOUNTER — Ambulatory Visit: Payer: Medicare Other | Admitting: Physician Assistant

## 2021-05-29 DIAGNOSIS — M5414 Radiculopathy, thoracic region: Secondary | ICD-10-CM | POA: Diagnosis not present

## 2021-05-31 ENCOUNTER — Other Ambulatory Visit: Payer: Self-pay | Admitting: Physician Assistant

## 2021-05-31 DIAGNOSIS — Z1159 Encounter for screening for other viral diseases: Secondary | ICD-10-CM | POA: Diagnosis not present

## 2021-05-31 DIAGNOSIS — R5382 Chronic fatigue, unspecified: Secondary | ICD-10-CM | POA: Diagnosis not present

## 2021-05-31 DIAGNOSIS — Z79899 Other long term (current) drug therapy: Secondary | ICD-10-CM | POA: Diagnosis not present

## 2021-05-31 DIAGNOSIS — M47814 Spondylosis without myelopathy or radiculopathy, thoracic region: Secondary | ICD-10-CM | POA: Diagnosis not present

## 2021-05-31 DIAGNOSIS — G8929 Other chronic pain: Secondary | ICD-10-CM | POA: Diagnosis not present

## 2021-05-31 DIAGNOSIS — E7800 Pure hypercholesterolemia, unspecified: Secondary | ICD-10-CM | POA: Diagnosis not present

## 2021-05-31 DIAGNOSIS — M545 Low back pain, unspecified: Secondary | ICD-10-CM | POA: Diagnosis not present

## 2021-05-31 DIAGNOSIS — F332 Major depressive disorder, recurrent severe without psychotic features: Secondary | ICD-10-CM

## 2021-06-02 ENCOUNTER — Ambulatory Visit (INDEPENDENT_AMBULATORY_CARE_PROVIDER_SITE_OTHER): Payer: Medicare Other

## 2021-06-02 ENCOUNTER — Other Ambulatory Visit: Payer: Self-pay

## 2021-06-02 DIAGNOSIS — Z0000 Encounter for general adult medical examination without abnormal findings: Secondary | ICD-10-CM | POA: Diagnosis not present

## 2021-06-02 DIAGNOSIS — F17200 Nicotine dependence, unspecified, uncomplicated: Secondary | ICD-10-CM | POA: Diagnosis not present

## 2021-06-02 NOTE — Progress Notes (Addendum)
Virtual Visit via Telephone Note  I connected with  Jonnae B Donelan on 06/02/21 at 12:00 PM EDT by telephone and verified that I am speaking with the correct person using two identifiers.  Medicare Annual Wellness visit completed telephonically due to Covid-19 pandemic.   Persons participating in this call: This Health Coach and this patient.   Location: Patient: home Provider: office   I discussed the limitations, risks, security and privacy concerns of performing an evaluation and management service by telephone and the availability of in person appointments. The patient expressed understanding and agreed to proceed.  Unable to perform video visit due to video visit attempted and failed and/or patient does not have video capability.   Some vital signs may be absent or patient reported.   Willette Brace, LPN   Subjective:   ASTER DAUER is a 60 y.o. female who presents for an Initial Medicare Annual Wellness Visit.  Review of Systems     Cardiac Risk Factors include: hypertension;dyslipidemia;obesity (BMI >30kg/m2);smoking/ tobacco exposure     Objective:    Today's Vitals   06/02/21 1204  PainSc: 7    There is no height or weight on file to calculate BMI.  Advanced Directives 06/02/2021  Does Patient Have a Medical Advance Directive? No  Would patient like information on creating a medical advance directive? No - Patient declined    Current Medications (verified) Outpatient Encounter Medications as of 06/02/2021  Medication Sig   ALPRAZolam (XANAX) 0.5 MG tablet Take 1 tablet (0.5 mg total) by mouth 2 (two) times daily as needed for anxiety.   AMBULATORY NON FORMULARY MEDICATION Tens unit for lumbar spine for low back pain, muscle spasm, lumbar DDD.   amLODipine (NORVASC) 10 MG tablet Take 1 tablet (10 mg total) by mouth daily.   busPIRone (BUSPAR) 15 MG tablet Take 1 tablet (15 mg total) by mouth 3 (three) times daily.   CALCIUM-MAGNESIUM-ZINC PO Take by mouth  daily.   cholecalciferol (VITAMIN D3) 25 MCG (1000 UT) tablet Take 1,000 Units by mouth daily.   cyclobenzaprine (FLEXERIL) 10 MG tablet Take 1 tablet (10 mg total) by mouth 3 (three) times daily as needed for muscle spasms.   diclofenac sodium (VOLTAREN) 1 % GEL Apply 4 g topically 4 (four) times daily. To affected joint. (Patient taking differently: Apply 4 g topically 4 (four) times daily as needed. To affected joint.)   famotidine (PEPCID) 20 MG tablet Take 1 tablet (20 mg total) by mouth 2 (two) times daily.   fluticasone (FLONASE) 50 MCG/ACT nasal spray Place 2 sprays into both nostrils daily.   gabapentin (NEURONTIN) 300 MG capsule TAKE 1 CAPSULES BY MOUTH 3 TIMES A DAY AS NEEDED FOR PAIN/NEUROPATHY   hydrochlorothiazide (HYDRODIURIL) 25 MG tablet Take 1 tablet (25 mg total) by mouth daily.   HYDROcodone-acetaminophen (NORCO) 10-325 MG tablet Take 1 tablet by mouth in the morning, at noon, and at bedtime.   lamoTRIgine (LAMICTAL) 25 MG tablet Take 1 tablet (25 mg total) by mouth daily. Appt for further refills   levothyroxine (SYNTHROID) 150 MCG tablet Take 1 tablet (150 mcg) by mouth Sun, Mon, Wed, Fri, Sat. Take 1.5 tablets (225 mcg) Tuesday and Thursday. Take on empty stomach at least 30 minutes prior to food or other medications.   meloxicam (MOBIC) 15 MG tablet Take 1 tablet (15 mg total) by mouth daily.   metoCLOPramide (REGLAN) 10 MG tablet Take 1 tablet (10 mg total) by mouth 4 (four) times daily - before meals  and at bedtime.   Omega 3-6-9 Fatty Acids (OMEGA 3-6-9 COMPLEX PO) Take 1 capsule by mouth daily.    omeprazole (PRILOSEC) 40 MG capsule Take 1 capsule (40 mg total) by mouth in the morning and at bedtime. Can go down to one a day after 2 weeks.   rizatriptan (MAXALT-MLT) 10 MG disintegrating tablet May repeat in 2 hours if needed for migraine   rOPINIRole (REQUIP) 1 MG tablet Take 1 tablet (1 mg total) by mouth 3 (three) times daily.   topiramate (TOPAMAX) 100 MG tablet Take  one tablet twice a day.   traZODone (DESYREL) 100 MG tablet Take one tablet before bed. (Patient taking differently: Take 100 mg by mouth at bedtime as needed.)   valACYclovir (VALTREX) 1000 MG tablet TAKE TWO TABLETS 2 TIMES A DAY AT ONSET OF COLD SORE FOR 2 DAYS   Vilazodone HCl (VIIBRYD) 40 MG TABS Take 1 tablet (40 mg total) by mouth daily.   Armodafinil 150 MG tablet Take 1 tablet (150 mg total) by mouth daily. (Patient not taking: Reported on 06/02/2021)   Misc. Devices MISC Initiate Bipap 8/4 cm to 16/12cm water pressure. New mask and supplies through DME [facility]. (Patient not taking: Reported on 06/02/2021)   nicotine (NICODERM CQ) 14 mg/24hr patch Place 1 patch (14 mg total) onto the skin daily. (Patient not taking: Reported on 06/02/2021)   nicotine (NICODERM CQ) 21 mg/24hr patch Place 1 patch (21 mg total) onto the skin daily. (Patient not taking: Reported on 06/02/2021)   No facility-administered encounter medications on file as of 06/02/2021.    Allergies (verified) Doxepin, Pristiq [desvenlafaxine succinate er], Estradiol, Gabapentin, Levothyroxine, Lexapro [escitalopram oxalate], Lisinopril, Losartan, Mirapex [pramipexole dihydrochloride], Phentermine, Pregabalin, Pristiq [desvenlafaxine], Seroquel [quetiapine fumarate], and Trintellix [vortioxetine]   History: Past Medical History:  Diagnosis Date   DDD (degenerative disc disease), lumbar    Depression    Fibromyalgia    Insomnia    Migraine    Thyroid disease    Tobacco use    Past Surgical History:  Procedure Laterality Date   ABDOMINAL HYSTERECTOMY     took uterus and both ovaries left   Family History  Problem Relation Age of Onset   Depression Mother    Hyperlipidemia Mother    Hypertension Mother    Heart attack Father    Hypertension Sister    Diabetes Sister    Alcohol abuse Brother    Hypertension Brother    Social History   Socioeconomic History   Marital status: Divorced    Spouse name: Not on file    Number of children: Not on file   Years of education: Not on file   Highest education level: Not on file  Occupational History   Not on file  Tobacco Use   Smoking status: Light Smoker    Packs/day: 0.25    Types: Cigarettes    Last attempt to quit: 10/02/2013    Years since quitting: 7.6   Smokeless tobacco: Never  Substance and Sexual Activity   Alcohol use: No   Drug use: No   Sexual activity: Never  Other Topics Concern   Not on file  Social History Narrative   Not on file   Social Determinants of Health   Financial Resource Strain: Low Risk    Difficulty of Paying Living Expenses: Not hard at all  Food Insecurity: No Food Insecurity   Worried About Estate manager/land agent of Food in the Last Year: Never true   Ran Out of  Food in the Last Year: Never true  Transportation Needs: No Transportation Needs   Lack of Transportation (Medical): No   Lack of Transportation (Non-Medical): No  Physical Activity: Inactive   Days of Exercise per Week: 0 days   Minutes of Exercise per Session: 0 min  Stress: Stress Concern Present   Feeling of Stress : To some extent  Social Connections: Moderately Isolated   Frequency of Communication with Friends and Family: More than three times a week   Frequency of Social Gatherings with Friends and Family: More than three times a week   Attends Religious Services: 1 to 4 times per year   Active Member of [employer] or Organizations: No   Attends Music therapist: Never   Marital Status: Divorced    Tobacco Counseling Ready to quit: Not Answered Counseling given: Not Answered   Clinical Intake:  Pre-visit preparation completed: Yes  Pain : 0-10 Pain Score: 7  Pain Type: Chronic pain Pain Location: Generalized Pain Descriptors / Indicators: Aching, Tender Pain Onset: More than a month ago Pain Frequency: Constant     BMI - recorded: 32.73 Nutritional Status: BMI > 30  Obese Nutritional Risks: None Diabetes: No  How often do  you need to have someone help you when you read instructions, pamphlets, or other written materials from your doctor or pharmacy?: 1 - Never  Diabetic?no  Interpreter Needed?: No  Information entered by :: Charlott Rakes, LPN   Activities of Daily Living In your present state of health, do you have any difficulty performing the following activities: 06/02/2021  Hearing? N  Vision? N  Difficulty concentrating or making decisions? N  Walking or climbing stairs? N  Dressing or bathing? N  Doing errands, shopping? N  Preparing Food and eating ? N  Using the Toilet? N  In the past six months, have you accidently leaked urine? N  Do you have problems with loss of bowel control? N  Managing your Medications? N  Managing your Finances? N  Housekeeping or managing your Housekeeping? N  Some recent data might be hidden    Patient Care Team: Lavada Mesi as PCP - General (Family Medicine)  Indicate any recent Medical Services you may have received from other than [facility] providers in the past year (date may be approximate).     Assessment:   This is a routine wellness examination for Sha[place].  Hearing/Vision screen Hearing Screening - Comments:: Pt denies any hearing issues  Vision Screening - Comments:: Pt follows up with provider annually with different providers   Dietary issues and exercise activities discussed: Current Exercise Habits: The patient does not participate in regular exercise at present   Goals Addressed             This Visit's Progress    Patient Stated       Lose weight        Depression Screen PHQ 2/9 Scores 06/02/2021 03/13/2021 12/08/2020 10/25/2020 06/01/2020 04/14/2020 01/19/2020  PHQ - 2 Score 0 '6 2 2 5 4 4  '$ PHQ- 9 Score - '26 10 8 17 9 12    '$ Fall Risk Fall Risk  06/02/2021  Falls in the past year? 0  Number falls in past yr: 0  Injury with Fall? 0  Risk for fall due to : Impaired vision  Follow up Falls prevention discussed    FALL RISK  PREVENTION PERTAINING TO THE HOME:  Any stairs in or around the home? Yes  If so,  are there any without handrails? No  Home free of loose throw rugs in walkways, pet beds, electrical cords, etc? Yes  Adequate lighting in your home to reduce risk of falls? Yes   ASSISTIVE DEVICES UTILIZED TO PREVENT FALLS:  Life alert? No  but has a pul cord for 911 Use of a cane, walker or w/c? No  Grab bars in the bathroom? Yes  Shower chair or bench in shower? No  Elevated toilet seat or a handicapped toilet? No   TIMED UP AND GO:  Was the test performed? No .   Cognitive Function: Declined        Immunizations Immunization History  Administered Date(s) Administered   Influenza Whole 06/13/2010   Influenza,inj,Quad PF,6+ Mos 07/21/2013, 06/08/2014, 06/30/2015, 07/02/2016, 05/20/2017, 05/13/2018, 07/14/2020   Influenza,inj,quad, With Preservative 06/22/2019   Influenza-Unspecified 06/22/2019   Janssen (J&J) SARS-COV-2 Vaccination 12/18/2019   Moderna Sars-Covid-2 Vaccination 07/29/2020   Tdap 01/01/2013   Zoster Recombinat (Shingrix) 07/14/2020, 11/29/2020    TDAP status: Up to date  Flu Vaccine status: Due, Education has been provided regarding the importance of this vaccine. Advised may receive this vaccine at local pharmacy or Health Dept. Aware to provide a copy of the vaccination record if obtained from local pharmacy or Health Dept. Verbalized acceptance and understanding.  Pneumococcal vaccine status: Due, Education has been provided regarding the importance of this vaccine. Advised may receive this vaccine at local pharmacy or Health Dept. Aware to provide a copy of the vaccination record if obtained from local pharmacy or Health Dept. Verbalized acceptance and understanding.  Covid-19 vaccine status: Completed vaccines  Qualifies for Shingles Vaccine? Yes   Zostavax completed Yes   Shingrix Completed?: Yes  Screening Tests Health Maintenance  Topic Date Due   Pneumococcal  Vaccine 59-14 Years old (1 - PCV) Never done   HIV Screening  Never done   COVID-19 Vaccine (3 - Booster for Janssen series) 09/23/2020   INFLUENZA VACCINE  04/30/2021   MAMMOGRAM  09/01/2021 (Originally 03/12/2019)   COLONOSCOPY (Pts 45-1yr Insurance coverage will need to be confirmed)  09/01/2021 (Originally 08/31/2020)   TETANUS/TDAP  01/02/2023   Hepatitis C Screening  Completed   Zoster Vaccines- Shingrix  Completed   HPV VACCINES  Aged Out    Health Maintenance  Health Maintenance Due  Topic Date Due   Pneumococcal Vaccine 028617Years old (1 - PCV) Never done   HIV Screening  Never done   COVID-19 Vaccine (3 - Booster for Janssen series) 09/23/2020   INFLUENZA VACCINE  04/30/2021    Colorectal cancer screening: Type of screening: Colonoscopy. Completed 08/31/17. Repeat every 3 years  Mammogram status: Completed 03/11/17. Repeat every year  Bone Density status: Completed 03/24/19. Results reflect: Bone density results: OSTEOPENIA. Repeat every 2 years.  Lung Cancer Screening: (Low Dose CT Chest recommended if Age 314-80years, 30 pack-year currently smoking OR have quit w/in 15years.) does qualify.   Lung Cancer Screening Referral: order placed 06/02/21  Additional Screening:  Hepatitis C Screening: Completed 02/10/18  Vision Screening: Recommended annual ophthalmology exams for early detection of glaucoma and other disorders of the eye. Is the patient up to date with their annual eye exam?  Yes  Who is the provider or what is the name of the office in which the patient attends annual eye exams? Unsure of providers name If pt is not established with a provider, would they like to be referred to a provider to establish care? No .   Dental Screening:  Recommended annual dental exams for proper oral hygiene  Community Resource Referral / Chronic Care Management: CRR required this visit?  No   CCM required this visit?  No      Plan:     I have personally reviewed and noted  the following in the patient's chart:   Medical and social history Use of alcohol, tobacco or illicit drugs  Current medications and supplements including opioid prescriptions. Patient is currently taking opioid prescriptions. Information provided to patient regarding non-opioid alternatives. Patient advised to discuss non-opioid treatment plan with their provider. Functional ability and status Nutritional status Physical activity Advanced directives List of other physicians Hospitalizations, surgeries, and ER visits in previous 12 months Vitals Screenings to include cognitive, depression, and falls Referrals and appointments  In addition, I have reviewed and discussed with patient certain preventive protocols, quality metrics, and best practice recommendations. A written personalized care plan for preventive services as well as general preventive health recommendations were provided to patient.     Willette Brace, LPN   [id number]   Nurse Notes: None

## 2021-06-02 NOTE — Patient Instructions (Signed)
Aimee Little , Thank you for taking time to come for your Medicare Wellness Visit. I appreciate your ongoing commitment to your health goals. Please review the following plan we discussed and let me know if I can assist you in the future.   Screening recommendations/referrals: Colonoscopy: 08/31/17 repeat every 3 years  Mammogram: Done 03/11/17 repeat every year Bone Density: Done 03/24/19 repeat every 2 years Recommended yearly ophthalmology/optometry visit for glaucoma screening and checkup Recommended yearly dental visit for hygiene and checkup  Vaccinations: Influenza vaccine: Due Pneumococcal vaccine: Due Tdap vaccine: Done 01/01/13 repeat every 10 years Shingles vaccine: Shingrix discussed. Please contact your pharmacy for coverage information.   Covid-19: Completed   Advanced directives: Please bring a copy of your health care power of attorney and living will to the office at your convenience.  Conditions/risks identified: Lose weight  Next appointment: Follow up in one year for your annual wellness visit.   Preventive Care 40-64 Years, Female Preventive care refers to lifestyle choices and visits with your health care provider that can promote health and wellness. What does preventive care include? A yearly physical exam. This is also called an annual well check. Dental exams once or twice a year. Routine eye exams. Ask your health care provider how often you should have your eyes checked. Personal lifestyle choices, including: Daily care of your teeth and gums. Regular physical activity. Eating a healthy diet. Avoiding tobacco and drug use. Limiting alcohol use. Practicing safe sex. Taking low-dose aspirin daily starting at age 62. Taking vitamin and mineral supplements as recommended by your health care provider. What happens during an annual well check? The services and screenings done by your health care provider during your annual well check will depend on your age,  overall health, lifestyle risk factors, and family history of disease. Counseling  Your health care provider may ask you questions about your: Alcohol use. Tobacco use. Drug use. Emotional well-being. Home and relationship well-being. Sexual activity. Eating habits. Work and work Statistician. Method of birth control. Menstrual cycle. Pregnancy history. Screening  You may have the following tests or measurements: Height, weight, and BMI. Blood pressure. Lipid and cholesterol levels. These may be checked every 5 years, or more frequently if you are over 4 years old. Skin check. Lung cancer screening. You may have this screening every year starting at age 73 if you have a 30-pack-year history of smoking and currently smoke or have quit within the past 15 years. Fecal occult blood test (FOBT) of the stool. You may have this test every year starting at age 16. Flexible sigmoidoscopy or colonoscopy. You may have a sigmoidoscopy every 5 years or a colonoscopy every 10 years starting at age 12. Hepatitis C blood test. Hepatitis B blood test. Sexually transmitted disease (STD) testing. Diabetes screening. This is done by checking your blood sugar (glucose) after you have not eaten for a while (fasting). You may have this done every 1-3 years. Mammogram. This may be done every 1-2 years. Talk to your health care provider about when you should start having regular mammograms. This may depend on whether you have a family history of breast cancer. BRCA-related cancer screening. This may be done if you have a family history of breast, ovarian, tubal, or peritoneal cancers. Pelvic exam and Pap test. This may be done every 3 years starting at age 40. Starting at age 9, this may be done every 5 years if you have a Pap test in combination with an HPV test. Bone  density scan. This is done to screen for osteoporosis. You may have this scan if you are at high risk for osteoporosis. Discuss your test  results, treatment options, and if necessary, the need for more tests with your health care provider. Vaccines  Your health care provider may recommend certain vaccines, such as: Influenza vaccine. This is recommended every year. Tetanus, diphtheria, and acellular pertussis (Tdap, Td) vaccine. You may need a Td booster every 10 years. Zoster vaccine. You may need this after age 16. Pneumococcal 13-valent conjugate (PCV13) vaccine. You may need this if you have certain conditions and were not previously vaccinated. Pneumococcal polysaccharide (PPSV23) vaccine. You may need one or two doses if you smoke cigarettes or if you have certain conditions. Talk to your health care provider about which screenings and vaccines you need and how often you need them. This information is not intended to replace advice given to you by your health care provider. Make sure you discuss any questions you have with your health care provider. Document Released: 10/13/2015 Document Revised: 06/05/2016 Document Reviewed: 07/18/2015 Elsevier Interactive Patient Education  2017 [employer] Prevention in the Home Falls can cause injuries. They can happen to people of all ages. There are many things you can do to make your home safe and to help prevent falls. What can I do on the outside of my home? Regularly fix the edges of walkways and driveways and fix any cracks. Remove anything that might make you trip as you walk through a door, such as a raised step or threshold. Trim any bushes or trees on the path to your home. Use bright outdoor lighting. Clear any walking paths of anything that might make someone trip, such as rocks or tools. Regularly check to see if handrails are loose or broken. Make sure that both sides of any steps have handrails. Any raised decks and porches should have guardrails on the edges. Have any leaves, snow, or ice cleared regularly. Use sand or salt on walking paths during  winter. Clean up any spills in your garage right away. This includes oil or grease spills. What can I do in the bathroom? Use night lights. Install grab bars by the toilet and in the tub and shower. Do not use towel bars as grab bars. Use non-skid mats or decals in the tub or shower. If you need to sit down in the shower, use a plastic, non-slip stool. Keep the floor dry. Clean up any water that spills on the floor as soon as it happens. Remove soap buildup in the tub or shower regularly. Attach bath mats securely with double-sided non-slip rug tape. Do not have throw rugs and other things on the floor that can make you trip. What can I do in the bedroom? Use night lights. Make sure that you have a light by your bed that is easy to reach. Do not use any sheets or blankets that are too big for your bed. They should not hang down onto the floor. Have a firm chair that has side arms. You can use this for support while you get dressed. Do not have throw rugs and other things on the floor that can make you trip. What can I do in the kitchen? Clean up any spills right away. Avoid walking on wet floors. Keep items that you use a lot in easy-to-reach places. If you need to reach something above you, use a strong step stool that has a grab bar. Keep  electrical cords out of the way. Do not use floor polish or wax that makes floors slippery. If you must use wax, use non-skid floor wax. Do not have throw rugs and other things on the floor that can make you trip. What can I do with my stairs? Do not leave any items on the stairs. Make sure that there are handrails on both sides of the stairs and use them. Fix handrails that are broken or loose. Make sure that handrails are as long as the stairways. Check any carpeting to make sure that it is firmly attached to the stairs. Fix any carpet that is loose or worn. Avoid having throw rugs at the top or bottom of the stairs. If you do have throw rugs,  attach them to the floor with carpet tape. Make sure that you have a light switch at the top of the stairs and the bottom of the stairs. If you do not have them, ask someone to add them for you. What else can I do to help prevent falls? Wear shoes that: Do not have high heels. Have rubber bottoms. Are comfortable and fit you well. Are closed at the toe. Do not wear sandals. If you use a stepladder: Make sure that it is fully opened. Do not climb a closed stepladder. Make sure that both sides of the stepladder are locked into place. Ask someone to hold it for you, if possible. Clearly mark and make sure that you can see: Any grab bars or handrails. First and last steps. Where the edge of each step is. Use tools that help you move around (mobility aids) if they are needed. These include: Canes. Walkers. Scooters. Crutches. Turn on the lights when you go into a dark area. Replace any light bulbs as soon as they burn out. Set up your furniture so you have a clear path. Avoid moving your furniture around. If any of your floors are uneven, fix them. If there are any pets around you, be aware of where they are. Review your medicines with your doctor. Some medicines can make you feel dizzy. This can increase your chance of falling. Ask your doctor what other things that you can do to help prevent falls. This information is not intended to replace advice given to you by your health care provider. Make sure you discuss any questions you have with your health care provider. Document Released: 07/13/2009 Document Revised: 02/22/2016 Document Reviewed: 10/21/2014 Elsevier Interactive Patient Education  2017 [employer].

## 2021-06-05 ENCOUNTER — Other Ambulatory Visit: Payer: Self-pay

## 2021-06-05 ENCOUNTER — Telehealth (INDEPENDENT_AMBULATORY_CARE_PROVIDER_SITE_OTHER): Payer: Medicare Other | Admitting: Physician Assistant

## 2021-06-05 VITALS — Ht 67.0 in | Wt 204.0 lb

## 2021-06-05 DIAGNOSIS — G894 Chronic pain syndrome: Secondary | ICD-10-CM | POA: Diagnosis not present

## 2021-06-05 DIAGNOSIS — F332 Major depressive disorder, recurrent severe without psychotic features: Secondary | ICD-10-CM | POA: Diagnosis not present

## 2021-06-05 DIAGNOSIS — Z1231 Encounter for screening mammogram for malignant neoplasm of breast: Secondary | ICD-10-CM | POA: Diagnosis not present

## 2021-06-05 DIAGNOSIS — S20219D Contusion of unspecified front wall of thorax, subsequent encounter: Secondary | ICD-10-CM

## 2021-06-05 DIAGNOSIS — S20219A Contusion of unspecified front wall of thorax, initial encounter: Secondary | ICD-10-CM | POA: Insufficient documentation

## 2021-06-05 MED ORDER — LAMOTRIGINE 25 MG PO TABS: 50.0000 mg | ORAL_TABLET | Freq: Every day | ORAL | 1 refills | Status: DC

## 2021-06-05 NOTE — Progress Notes (Signed)
Needs refill of Lamictal Was off med for a little bit and feels depression worsened  PHQ9 (14) -GAD7 (10) completed.

## 2021-06-05 NOTE — Progress Notes (Signed)
Patient ID: Aimee Little, female   DOB: 02-Jun-1961, 60 y.o.   MRN: [id number] .Marland KitchenVirtual Visit via Telephone Note  I connected with Delaina B Drab on 06/05/21 at  2:00 PM EDT by telephone and verified that I am speaking with the correct person using two identifiers.  Location: Patient: home Provider: clinic  .Marland KitchenParticipating in visit:  Patient: [name] Provider: Iran Planas PA-C   I discussed the limitations, risks, security and privacy concerns of performing an evaluation and management service by telephone and the availability of in person appointments. I also discussed with the patient that there may be a patient responsible charge related to this service. The patient expressed understanding and agreed to proceed.   History of Present Illness: Patient is a 60 year old female with chronic pain syndrome, MDD, anxiety, mood disorder who calls into the clinic to discuss medications.  Few weeks ago she bruised her ribs lifting some furniture.  She did confirm no fracture with her orthopedic provider.  This pain really got her mood down.  She is also stopped taking her Lamictal.  She feels like the combination got her into a really low spot.  She denies any suicidal thoughts or homicidal idealizations.  She did start back on Lamictal 25 mg and already feels better.  She wonders about an increase. She continues to take buspar and viibryd daily.   .. Active Ambulatory Problems    Diagnosis Date Noted   HOT FLASHES 12/29/2009   [place] DISEASE, LUMBAR 12/29/2009   Fibromyalgia 01/01/2013   Unspecified vitamin D deficiency 05/16/2013   Migraine without status migrainosus, not intractable 07/21/2013   Chronic pain syndrome 11/05/2013   Anxiety and depression 12/17/2013   Adenomatous colon polyp 12/20/2013   Obesity 11/25/2014   Chronic fatigue 05/26/2015   RLS (restless legs syndrome) 09/22/2015   Hypothyroid 10/27/2015   Insomnia 11/21/2015   Severe episode of recurrent major depressive  disorder, without psychotic features ([place]) 11/29/2015   Rhinitis, allergic 11/29/2015   Vaginal atrophy 01/02/2016   Generalized anxiety disorder 03/19/2016   Panic attacks 03/19/2016   Loose body in knee 04/17/2016   Chondromalacia of patellofemoral joint 04/17/2016   Diverticulitis of colon without hemorrhage 07/19/2016   Diverticulitis 09/10/2017   Light tobacco smoker 10/01/2017   Essential hypertension 10/01/2017   DDD (degenerative disc disease), lumbar 04/01/2018   OSA on CPAP 05/29/2018   Chronic bilateral thoracic back pain 08/04/2018   Osteopenia 03/24/2019   Chronic bilateral low back pain with bilateral sciatica 12/13/2019   DDD (degenerative disc disease), thoracic 01/04/2020   Mixed hyperlipidemia 01/20/2020   Bilateral lower extremity edema 09/01/2020   Abnormal ECG 11/20/2020   Hypertension goal BP (blood pressure) < 130/80 02/28/2021   Bruised rib 06/05/2021   Resolved Ambulatory Problems    Diagnosis Date Noted   Unspecified hypothyroidism 12/29/2009   TOBACCO ABUSE 06/13/2010   GRIEF REACTION, ACUTE 12/29/2009   Depression 02/06/2010   Osteoarthrosis involving lower leg 12/29/2009   NAUSEA AND VOMITING 05/02/2010   Osteoarthritis of left knee 07/21/2013   Medication management 11/05/2013   Nausea with vomiting 04/07/2014   Lumbago 11/25/2014   Sinusitis 04/13/2015   Sacroiliac joint dysfunction of both sides 09/06/2015   Bilateral low back pain without sciatica 09/06/2015   Right knee pain 11/07/2015   Left knee pain 01/23/2016   Abdominal pain, left lower quadrant 04/25/2016   Trochanteric bursitis of left hip 01/29/2017   Nonintractable headache 03/06/2017   Tachycardia 03/06/2017   Rib pain on left side  08/03/2017   Elevated blood pressure reading 10/01/2017   Rib pain on right side 11/04/2017   Traumatic hematoma of eyelid, right, initial encounter 11/04/2017   Low grade fever 04/01/2018   Acute bilateral low back pain with bilateral sciatica  04/01/2018   Closed fracture of one rib of right side 12/01/2018   Upper back pain 11/30/2019   Mid back pain 12/13/2019   Muscle spasm of back 12/13/2019   Chest pain 09/01/2020   Epigastric pain 09/01/2020   Past Medical History:  Diagnosis Date   Migraine    Thyroid disease    Tobacco use        Observations/Objective: No acute distress Flat affect.  No labored breathing.   .. Today's Vitals   06/05/21 1340  Weight: 204 lb (92.5 kg)  Height: '5\' 7"'$  (1.702 m)   Body mass index is 31.95 kg/m.   .. Depression screen [facility] 2/9 06/05/2021 06/02/2021 03/13/2021 12/08/2020 10/25/2020  Decreased Interest 3 0 '3 1 1  '$ Down, Depressed, Hopeless 3 0 '3 1 1  '$ PHQ - 2 Score 6 0 '6 2 2  '$ Altered sleeping 1 - '3 2 1  '$ Tired, decreased energy 3 - '3 2 2  '$ Change in appetite 1 - '3 2 2  '$ Feeling bad or failure about yourself  1 - 3 0 0  Trouble concentrating 1 - '3 2 1  '$ Moving slowly or fidgety/restless 1 - 3 0 0  Suicidal thoughts 0 - 2 0 0  PHQ-9 Score 14 - '26 10 8  '$ Difficult doing work/chores Somewhat difficult - Extremely dIfficult Very difficult Somewhat difficult  Some recent data might be hidden   .Marland Kitchen GAD 7 : Generalized Anxiety Score 06/05/2021 03/13/2021 12/08/2020 10/25/2020  Nervous, Anxious, on Edge '2 3 2 1  '$ Control/stop worrying '1 3 1 1  '$ Worry too much - different things 0 '3 1 1  '$ Trouble relaxing '3 3 2 1  '$ Restless '3 3 2 1  '$ Easily annoyed or irritable 0 0 0 0  Afraid - awful might happen '1 3 1 1  '$ Total GAD 7 Score '10 18 9 6  '$ Anxiety Difficulty Somewhat difficult Extremely difficult Somewhat difficult Somewhat difficult     Assessment and Plan: Marland KitchenMarland KitchenMelissa was seen today for follow-up.  Diagnoses and all orders for this visit:  Severe episode of recurrent major depressive disorder, without psychotic features ([place]) -     lamoTRIgine (LAMICTAL) 25 MG tablet; Take 2 tablets (50 mg total) by mouth daily.  Encounter for screening mammogram for malignant neoplasm of breast -     MM 3D  SCREEN BREAST BILATERAL; Future  Contusion of rib, unspecified laterality, subsequent encounter  Chronic pain syndrome  Increased lamictal to '50mg'$  daily. Follow up in 3 months.   Discussed healing time for rib contusion.    Follow Up Instructions:    I discussed the assessment and treatment plan with the patient. The patient was provided an opportunity to ask questions and all were answered. The patient agreed with the plan and demonstrated an understanding of the instructions.   The patient was advised to call back or seek an in-person evaluation if the symptoms worsen or if the condition fails to improve as anticipated.  I provided 10 minutes of non-face-to-face time during this encounter.   Iran Planas, PA-C

## 2021-06-19 ENCOUNTER — Other Ambulatory Visit: Payer: Self-pay

## 2021-06-19 ENCOUNTER — Ambulatory Visit (INDEPENDENT_AMBULATORY_CARE_PROVIDER_SITE_OTHER): Payer: Medicare Other

## 2021-06-19 DIAGNOSIS — F17210 Nicotine dependence, cigarettes, uncomplicated: Secondary | ICD-10-CM

## 2021-06-19 DIAGNOSIS — Z0000 Encounter for general adult medical examination without abnormal findings: Secondary | ICD-10-CM

## 2021-06-19 DIAGNOSIS — F17200 Nicotine dependence, unspecified, uncomplicated: Secondary | ICD-10-CM

## 2021-06-22 ENCOUNTER — Encounter: Payer: Self-pay | Admitting: Physician Assistant

## 2021-06-22 DIAGNOSIS — I2510 Atherosclerotic heart disease of native coronary artery without angina pectoris: Secondary | ICD-10-CM | POA: Insufficient documentation

## 2021-06-22 DIAGNOSIS — I700 Atherosclerosis of aorta: Secondary | ICD-10-CM | POA: Insufficient documentation

## 2021-06-22 DIAGNOSIS — J439 Emphysema, unspecified: Secondary | ICD-10-CM | POA: Insufficient documentation

## 2021-06-22 DIAGNOSIS — R918 Other nonspecific abnormal finding of lung field: Secondary | ICD-10-CM | POA: Insufficient documentation

## 2021-06-22 MED ORDER — ATORVASTATIN CALCIUM 40 MG PO TABS: 40.0000 mg | ORAL_TABLET | Freq: Every day | ORAL | 3 refills | Status: DC

## 2021-06-22 MED ORDER — INCRUSE ELLIPTA 62.5 MCG/INH IN AEPB: 1.0000 | INHALATION_SPRAY | Freq: Every day | RESPIRATORY_TRACT | 5 refills | Status: DC

## 2021-06-22 NOTE — Progress Notes (Signed)
Aimee Little,   CT scan did show plaque in aorta and around heart. You really need to start a stain to help combat this and low your CV risk. Are you ok with starting.   Multiple small pulmonary nodules in lungs but not concerning and do not need follow specifically.   Shows centrolobular emphysema. To help with breathing we could consider a daily inhaler. Would you like to discuss this more?

## 2021-06-25 ENCOUNTER — Encounter: Payer: Self-pay | Admitting: Physician Assistant

## 2021-06-25 ENCOUNTER — Other Ambulatory Visit: Payer: Self-pay

## 2021-06-25 ENCOUNTER — Ambulatory Visit (INDEPENDENT_AMBULATORY_CARE_PROVIDER_SITE_OTHER): Payer: Medicare Other | Admitting: Physician Assistant

## 2021-06-25 VITALS — BP 132/80 | HR 104 | Ht 67.0 in | Wt 206.0 lb

## 2021-06-25 DIAGNOSIS — F332 Major depressive disorder, recurrent severe without psychotic features: Secondary | ICD-10-CM | POA: Diagnosis not present

## 2021-06-25 DIAGNOSIS — F410 Panic disorder [episodic paroxysmal anxiety] without agoraphobia: Secondary | ICD-10-CM

## 2021-06-25 DIAGNOSIS — F5105 Insomnia due to other mental disorder: Secondary | ICD-10-CM | POA: Diagnosis not present

## 2021-06-25 DIAGNOSIS — F419 Anxiety disorder, unspecified: Secondary | ICD-10-CM

## 2021-06-25 DIAGNOSIS — J0140 Acute pansinusitis, unspecified: Secondary | ICD-10-CM | POA: Diagnosis not present

## 2021-06-25 DIAGNOSIS — F99 Mental disorder, not otherwise specified: Secondary | ICD-10-CM

## 2021-06-25 MED ORDER — FLUTICASONE PROPIONATE 50 MCG/ACT NA SUSP: 2.0000 | Freq: Every day | NASAL | 0 refills | Status: DC

## 2021-06-25 MED ORDER — MIRTAZAPINE 7.5 MG PO TABS: ORAL_TABLET | ORAL | 0 refills | Status: DC

## 2021-06-25 NOTE — Patient Instructions (Addendum)
Start zyrtec/claritin and flonase.  Remeron 1-2 tablets at night. STOP trazodone.    For [facility] Phone:  [phone]  Address:  [place], [place] [address]  Hours:  Open 24/7, No appointment required.

## 2021-06-25 NOTE — Progress Notes (Signed)
Subjective:    Patient ID: Aimee Little, female    DOB: 02-Apr-1961, 59 y.o.   MRN: [id number]  HPI Patient is a 60 year old female with a long history of major depressive disorder, anxiety, panic attacks, insomnia who presents to the clinic with worsening mood.  Since the summer her mood has not been controlled.  She feels like she cannot get out of this funk.  She wants to lay around all day and has no motivation to do anything; however, she cannot sleep at night.  Trazodone is not helping at all at night.  The increase in Lamictal did seem to help some but not enough for her depression.  She does think she is better off dead but has not made any plan to hurt herself.  She does not feel like she would hurt her self. She just feels tired and wants to sleep. She has [place] appt 10/14. She does not go to any counseling.   She is having a lot of sinus pressure and nasal congestion. Not really tired anything to make better. No fever, chills, cough.  .. Active Ambulatory Problems    Diagnosis Date Noted   HOT FLASHES 12/29/2009   [place] DISEASE, LUMBAR 12/29/2009   Fibromyalgia 01/01/2013   Unspecified vitamin D deficiency 05/16/2013   Migraine without status migrainosus, not intractable 07/21/2013   Chronic pain syndrome 11/05/2013   Anxiety 12/17/2013   Adenomatous colon polyp 12/20/2013   Obesity 11/25/2014   Chronic fatigue 05/26/2015   RLS (restless legs syndrome) 09/22/2015   Hypothyroid 10/27/2015   Insomnia 11/21/2015   Severe episode of recurrent major depressive disorder, without psychotic features ([place]) 11/29/2015   Rhinitis, allergic 11/29/2015   Vaginal atrophy 01/02/2016   Generalized anxiety disorder 03/19/2016   Panic attacks 03/19/2016   Loose body in knee 04/17/2016   Chondromalacia of patellofemoral joint 04/17/2016   Diverticulitis of colon without hemorrhage 07/19/2016   Diverticulitis 09/10/2017   Light tobacco smoker 10/01/2017   Essential hypertension 10/01/2017    DDD (degenerative disc disease), lumbar 04/01/2018   OSA on CPAP 05/29/2018   Chronic bilateral thoracic back pain 08/04/2018   Osteopenia 03/24/2019   Chronic bilateral low back pain with bilateral sciatica 12/13/2019   DDD (degenerative disc disease), thoracic 01/04/2020   Mixed hyperlipidemia 01/20/2020   Bilateral lower extremity edema 09/01/2020   Abnormal ECG 11/20/2020   Hypertension goal BP (blood pressure) < 130/80 02/28/2021   Bruised rib 06/05/2021   Aortic atherosclerosis ([place]) 06/22/2021   Coronary artery calcification seen on CT scan 06/22/2021   Emphysema lung ([place]) 06/22/2021   Pulmonary nodules 06/22/2021   Resolved Ambulatory Problems    Diagnosis Date Noted   Unspecified hypothyroidism 12/29/2009   TOBACCO ABUSE 06/13/2010   GRIEF REACTION, ACUTE 12/29/2009   Depression 02/06/2010   Osteoarthrosis involving lower leg 12/29/2009   NAUSEA AND VOMITING 05/02/2010   Osteoarthritis of left knee 07/21/2013   Medication management 11/05/2013   Nausea with vomiting 04/07/2014   Lumbago 11/25/2014   Sinusitis 04/13/2015   Sacroiliac joint dysfunction of both sides 09/06/2015   Bilateral low back pain without sciatica 09/06/2015   Right knee pain 11/07/2015   Left knee pain 01/23/2016   Abdominal pain, left lower quadrant 04/25/2016   Trochanteric bursitis of left hip 01/29/2017   Nonintractable headache 03/06/2017   Tachycardia 03/06/2017   Rib pain on left side 08/03/2017   Elevated blood pressure reading 10/01/2017   Rib pain on right side 11/04/2017   Traumatic hematoma  of eyelid, right, initial encounter 11/04/2017   Low grade fever 04/01/2018   Acute bilateral low back pain with bilateral sciatica 04/01/2018   Closed fracture of one rib of right side 12/01/2018   Upper back pain 11/30/2019   Mid back pain 12/13/2019   Muscle spasm of back 12/13/2019   Chest pain 09/01/2020   Epigastric pain 09/01/2020   Past Medical History:  Diagnosis Date    Migraine    Thyroid disease    Tobacco use      Review of Systems See HPI.     Objective:   Physical Exam Vitals reviewed.  Constitutional:      Appearance: She is obese.     Comments: Very frustrated.  HENT:     Head: Normocephalic.     Right Ear: Tympanic membrane, ear canal and external ear normal. There is no impacted cerumen.     Left Ear: Tympanic membrane, ear canal and external ear normal. There is no impacted cerumen.     Nose: Congestion present.     Mouth/Throat:     Mouth: Mucous membranes are moist.     Pharynx: No oropharyngeal exudate or posterior oropharyngeal erythema.  Eyes:     Extraocular Movements: Extraocular movements intact.     Conjunctiva/sclera: Conjunctivae normal.     Pupils: Pupils are equal, round, and reactive to light.  Cardiovascular:     Rate and Rhythm: Normal rate and regular rhythm.  Pulmonary:     Effort: Pulmonary effort is normal.     Breath sounds: Normal breath sounds.  Neurological:     Mental Status: She is alert and oriented to person, place, and time.  Psychiatric:     Comments: Flat affect and frustrated.    ... Depression screen [facility] 2/9 06/25/2021 06/05/2021 06/02/2021 03/13/2021 12/08/2020  Decreased Interest 3 3 0 3 1  Down, Depressed, Hopeless 3 3 0 3 1  PHQ - 2 Score 6 6 0 6 2  Altered sleeping 3 1 - 3 2  Tired, decreased energy 3 3 - 3 2  Change in appetite 3 1 - 3 2  Feeling bad or failure about yourself  3 1 - 3 0  Trouble concentrating 3 1 - 3 2  Moving slowly or fidgety/restless 3 1 - 3 0  Suicidal thoughts 2 0 - 2 0  PHQ-9 Score 26 14 - 26 10  Difficult doing work/chores Extremely dIfficult Somewhat difficult - Extremely dIfficult Very difficult  Some recent data might be hidden   .Marland Kitchen GAD 7 : Generalized Anxiety Score 06/25/2021 06/05/2021 03/13/2021 12/08/2020  Nervous, Anxious, on Edge 3 2 3 2   Control/stop worrying 3 1 3 1   Worry too much - different things 3 0 3 1  Trouble relaxing 3 3 3 2   Restless 3 3 3 2    Easily annoyed or irritable 3 0 0 0  Afraid - awful might happen 2 1 3 1   Total GAD 7 Score 20 10 18 9   Anxiety Difficulty Extremely difficult Somewhat difficult Extremely difficult Somewhat difficult            Assessment & Plan:  Marland KitchenMarland KitchenMelissa was seen today for depression.  Diagnoses and all orders for this visit:  Insomnia due to other mental disorder -     mirtazapine (REMERON) 7.5 MG tablet; Take one to two tablets at bedtime.  Acute non-recurrent pansinusitis -     fluticasone (FLONASE) 50 MCG/ACT nasal spray; Place 2 sprays into both nostrils daily.  Severe  episode of recurrent major depressive disorder, without psychotic features ([place]) -     mirtazapine (REMERON) 7.5 MG tablet; Take one to two tablets at bedtime.  Panic attacks -     mirtazapine (REMERON) 7.5 MG tablet; Take one to two tablets at bedtime.  Anxiety -     mirtazapine (REMERON) 7.5 MG tablet; Take one to two tablets at bedtime.  Pt is taking  buspar 15mg  TID, viibryd 40mg , lamictal 50mg  and continues to battle depression and anxiety and insomnia.  Not tolerated seroquel/abilify in the past.  Start remeron at bedtime. Stop trazodone.  [place] appt 10/14.  Discussed waht to do if having thoughts of self harm. UC at [facility] [facility] given to patient.   No signs of bacterial sinusitis. Start claritin/zyrtec with flonase. Follow up as needed if not improving or if symptoms worsening.   Spent 30 minutes with patient discussing mood, medications, next steps what to do if mood worsens.

## 2021-06-28 ENCOUNTER — Telehealth: Payer: Self-pay

## 2021-06-28 ENCOUNTER — Other Ambulatory Visit: Payer: Self-pay | Admitting: Physician Assistant

## 2021-06-28 DIAGNOSIS — K2100 Gastro-esophageal reflux disease with esophagitis, without bleeding: Secondary | ICD-10-CM

## 2021-06-28 DIAGNOSIS — F5105 Insomnia due to other mental disorder: Secondary | ICD-10-CM

## 2021-06-28 DIAGNOSIS — F419 Anxiety disorder, unspecified: Secondary | ICD-10-CM

## 2021-06-28 DIAGNOSIS — F332 Major depressive disorder, recurrent severe without psychotic features: Secondary | ICD-10-CM

## 2021-06-28 DIAGNOSIS — F410 Panic disorder [episodic paroxysmal anxiety] without agoraphobia: Secondary | ICD-10-CM

## 2021-06-28 NOTE — Telephone Encounter (Signed)
Pt will take higher dose and let us know how it goes. Pt states she will not take the trazodone.

## 2021-06-28 NOTE — Telephone Encounter (Signed)
Stop the trazodone. Ok to take up to 4 of the mirtazapine at bedtime to help with sleep and mood.

## 2021-06-28 NOTE — Telephone Encounter (Signed)
Pt called and wanted to update you on the mirtazapine. Pt states that she feels like her mood has greatly improved though she is still struggling with insomnia. Pt states she was awake all night. Pt states she took two trazodone this morning in an attempt to sleep and they didn't help. Pt also states she is still having trouble breathing when the air is dry.

## 2021-06-29 NOTE — Telephone Encounter (Signed)
Pt called and stated that she took 2 of the mirtazapine (30mg  total). Pharmacy filled her rx for 30 tabs of 15mg  instead of 60 tablets of 7.5mg .   Pt states she slept great last night and her mood is fantastic today. Pt states she thinks 30mg  is a good dose for her. Pt requests a new prescription so she doesn't run out. Rx pended.

## 2021-07-02 DIAGNOSIS — M545 Low back pain, unspecified: Secondary | ICD-10-CM | POA: Diagnosis not present

## 2021-07-02 DIAGNOSIS — Z79899 Other long term (current) drug therapy: Secondary | ICD-10-CM | POA: Diagnosis not present

## 2021-07-02 DIAGNOSIS — M546 Pain in thoracic spine: Secondary | ICD-10-CM | POA: Diagnosis not present

## 2021-07-02 DIAGNOSIS — M47814 Spondylosis without myelopathy or radiculopathy, thoracic region: Secondary | ICD-10-CM | POA: Diagnosis not present

## 2021-07-02 DIAGNOSIS — G8929 Other chronic pain: Secondary | ICD-10-CM | POA: Diagnosis not present

## 2021-07-02 MED ORDER — MIRTAZAPINE 30 MG PO TABS: ORAL_TABLET | ORAL | 0 refills | Status: DC

## 2021-07-02 NOTE — Telephone Encounter (Signed)
Signed and sent to pharmacy.

## 2021-07-11 ENCOUNTER — Telehealth (INDEPENDENT_AMBULATORY_CARE_PROVIDER_SITE_OTHER): Payer: Medicare Other | Admitting: Sports Medicine

## 2021-07-11 DIAGNOSIS — J329 Chronic sinusitis, unspecified: Secondary | ICD-10-CM | POA: Diagnosis not present

## 2021-07-11 DIAGNOSIS — J40 Bronchitis, not specified as acute or chronic: Secondary | ICD-10-CM | POA: Diagnosis not present

## 2021-07-11 MED ORDER — AZITHROMYCIN 250 MG PO TABS: ORAL_TABLET | ORAL | 0 refills | Status: DC

## 2021-07-11 MED ORDER — PREDNISONE 50 MG PO TABS: 50.0000 mg | ORAL_TABLET | Freq: Every day | ORAL | 0 refills | Status: DC

## 2021-07-11 NOTE — Progress Notes (Signed)
   Virtual Visit via Telephone   I connected with  Deamber B Posas  on 07/11/21 by telephone/telehealth and verified that I am speaking with the correct person using two identifiers.   I discussed the limitations, risks, security and privacy concerns of performing an evaluation and management service by telephone, including the higher likelihood of inaccurate diagnosis and treatment, and the availability of in person appointments.  We also discussed the likely need of an additional face to face encounter for complete and high quality delivery of care.  I also discussed with the patient that there may be a patient responsible charge related to this service. The patient expressed understanding and wishes to proceed.  Provider location is in medical facility. Patient location is at their home, different from provider location. People involved in care of the patient during this telehealth encounter were myself, my nurse/medical assistant, and my front office/scheduling team member.  Review of Systems: No fevers, chills, night sweats, weight loss, chest pain, or shortness of breath.   Objective Findings:    General: Speaking full sentences, no audible heavy breathing.  Sounds alert and appropriately interactive.    Independent interpretation of tests performed by another provider:   None.  Brief History, Exam, Impression, and Recommendations:    Sinobronchitis This is a very pleasant 60 year old female with a history of emphysema, she is having increasing facial pain and pressure, sore throat, mild cough. She has had the symptoms now for 2-1/2 weeks. We have treated her for sinobronchitis in the past, she feels as though the symptoms are very similar, adding prednisone, azithromycin, she tells me Augmentin pills are too big to swallow. Return to see me on an as-needed basis.   I discussed the above assessment and treatment plan with the patient. The patient was provided an opportunity to ask  questions and all were answered. The patient agreed with the plan and demonstrated an understanding of the instructions.   The patient was advised to call back or seek an in-person evaluation if the symptoms worsen or if the condition fails to improve as anticipated.   I provided 30 minutes of verbal and non-verbal time during this encounter date, time was needed to gather information, review chart, records, communicate/coordinate with staff remotely, as well as complete documentation.   ___________________________________________ Gwen Her. Dianah Field, M.D., ABFM., CAQSM. Primary Care and Sports Medicine [facility]  Adjunct Professor of [place] [employer]

## 2021-07-11 NOTE — Assessment & Plan Note (Signed)
This is a very pleasant 60 year old female with a history of emphysema, she is having increasing facial pain and pressure, sore throat, mild cough. She has had the symptoms now for 2-1/2 weeks. We have treated her for sinobronchitis in the past, she feels as though the symptoms are very similar, adding prednisone, azithromycin, she tells me Augmentin pills are too big to swallow. Return to see me on an as-needed basis.

## 2021-07-13 ENCOUNTER — Telehealth: Payer: Self-pay

## 2021-07-13 NOTE — Telephone Encounter (Signed)
Medication: mirtazapine (REMERON) 30 MG tablet Prior authorization submitted via CoverMyMeds on 07/13/2021 PA submission pending

## 2021-07-17 ENCOUNTER — Telehealth: Payer: Self-pay

## 2021-07-17 NOTE — Telephone Encounter (Signed)
Medication: mirtazapine (REMERON) 30 MG tablet Prior authorization determination received Medication has been approved Approval dates: 07/13/2021-07/13/2022  Patient aware via: phone Pharmacy aware: Yes Provider aware via this encounter

## 2021-07-17 NOTE — Telephone Encounter (Signed)
Aimee Little from [facility] left a msg requesting clarification for mirtazapine dosing. Per pharmacist, patient was taking 15 mg. New rx is 60 mg. Is the current dose correct?

## 2021-07-18 NOTE — Telephone Encounter (Signed)
It should be 30mg  one tablet at bedtime.

## 2021-07-19 NOTE — Telephone Encounter (Signed)
Current rx on file is 30 mg/2 tabs at bedtime. Pharmacist requesting an updated rx. Thanks in advance.

## 2021-07-20 NOTE — Telephone Encounter (Signed)
Sent!

## 2021-07-26 ENCOUNTER — Ambulatory Visit (INDEPENDENT_AMBULATORY_CARE_PROVIDER_SITE_OTHER): Payer: Medicare Other

## 2021-07-26 ENCOUNTER — Other Ambulatory Visit: Payer: Self-pay

## 2021-07-26 DIAGNOSIS — Z1231 Encounter for screening mammogram for malignant neoplasm of breast: Secondary | ICD-10-CM | POA: Diagnosis not present

## 2021-07-29 ENCOUNTER — Other Ambulatory Visit: Payer: Self-pay | Admitting: Physician Assistant

## 2021-07-29 DIAGNOSIS — G43909 Migraine, unspecified, not intractable, without status migrainosus: Secondary | ICD-10-CM

## 2021-07-30 NOTE — Progress Notes (Signed)
Normal mammogram. Follow up in 1 year.

## 2021-08-03 DIAGNOSIS — M545 Low back pain, unspecified: Secondary | ICD-10-CM | POA: Diagnosis not present

## 2021-08-03 DIAGNOSIS — M546 Pain in thoracic spine: Secondary | ICD-10-CM | POA: Diagnosis not present

## 2021-08-03 DIAGNOSIS — G8929 Other chronic pain: Secondary | ICD-10-CM | POA: Diagnosis not present

## 2021-08-03 DIAGNOSIS — M47814 Spondylosis without myelopathy or radiculopathy, thoracic region: Secondary | ICD-10-CM | POA: Diagnosis not present

## 2021-08-03 DIAGNOSIS — Z79899 Other long term (current) drug therapy: Secondary | ICD-10-CM | POA: Diagnosis not present

## 2021-08-14 ENCOUNTER — Other Ambulatory Visit: Payer: Self-pay | Admitting: Physician Assistant

## 2021-08-14 DIAGNOSIS — I10 Essential (primary) hypertension: Secondary | ICD-10-CM

## 2021-08-14 NOTE — Telephone Encounter (Signed)
Please call pt to schedule appt.  No further refills until pt is seen.  T. Lupe Bonner, CMA  

## 2021-08-14 NOTE — Telephone Encounter (Signed)
Pt has been scheduled for a follow up on Monday 08/20/21 with [name]. Am

## 2021-08-20 ENCOUNTER — Ambulatory Visit (INDEPENDENT_AMBULATORY_CARE_PROVIDER_SITE_OTHER): Payer: Medicare Other | Admitting: Physician Assistant

## 2021-08-20 ENCOUNTER — Other Ambulatory Visit: Payer: Self-pay

## 2021-08-20 ENCOUNTER — Encounter: Payer: Self-pay | Admitting: Physician Assistant

## 2021-08-20 VITALS — BP 127/58 | HR 100 | Ht 67.0 in | Wt 206.0 lb

## 2021-08-20 DIAGNOSIS — J432 Centrilobular emphysema: Secondary | ICD-10-CM

## 2021-08-20 DIAGNOSIS — I10 Essential (primary) hypertension: Secondary | ICD-10-CM

## 2021-08-20 DIAGNOSIS — F332 Major depressive disorder, recurrent severe without psychotic features: Secondary | ICD-10-CM | POA: Diagnosis not present

## 2021-08-20 DIAGNOSIS — G4733 Obstructive sleep apnea (adult) (pediatric): Secondary | ICD-10-CM | POA: Diagnosis not present

## 2021-08-20 DIAGNOSIS — Z9989 Dependence on other enabling machines and devices: Secondary | ICD-10-CM

## 2021-08-20 DIAGNOSIS — F99 Mental disorder, not otherwise specified: Secondary | ICD-10-CM

## 2021-08-20 DIAGNOSIS — F5105 Insomnia due to other mental disorder: Secondary | ICD-10-CM

## 2021-08-20 MED ORDER — AMLODIPINE BESYLATE 10 MG PO TABS: 10.0000 mg | ORAL_TABLET | Freq: Every day | ORAL | 3 refills | Status: DC

## 2021-08-20 MED ORDER — TRAZODONE HCL 50 MG PO TABS: 150.0000 mg | ORAL_TABLET | Freq: Every evening | ORAL | 5 refills | Status: DC | PRN

## 2021-08-20 MED ORDER — UMECLIDINIUM BROMIDE 62.5 MCG/ACT IN AEPB: 1.0000 | INHALATION_SPRAY | Freq: Every day | RESPIRATORY_TRACT | 3 refills | Status: DC

## 2021-08-21 ENCOUNTER — Encounter: Payer: Self-pay | Admitting: Physician Assistant

## 2021-08-21 NOTE — Progress Notes (Signed)
Subjective:    Patient ID: Aimee Little, female    DOB: [birth year], 60 y.o.   MRN: [id number]  HPI 60 y.o female with history of HTN, OSA, anxiety, depression, and insomnia presenting for refills of Amlodipine. Although her sons recent engagement has been taking a toll on her emotionally, she reports an overall improvement in her mood since her last visit with the addition of Buspar and Lamictal. She reports feeling more social than normal, has been doing more shopping, and going on walks more frequently. States she is able to sleep, but that she stopped taking Remeron due to weird dreams. Continues to take Trazodone. Denies chest pain, shortness of breath, palpitations, panic attacks, or suicidal thoughts.   .. Active Ambulatory Problems    Diagnosis Date Noted   HOT FLASHES 12/29/2009   [place] DISEASE, LUMBAR 12/29/2009   Fibromyalgia 01/01/2013   Unspecified vitamin D deficiency 05/16/2013   Migraine without status migrainosus, not intractable 07/21/2013   Chronic pain syndrome 11/05/2013   Anxiety 12/17/2013   Adenomatous colon polyp 12/20/2013   Obesity 11/25/2014   Sinobronchitis 04/13/2015   Chronic fatigue 05/26/2015   RLS (restless legs syndrome) 09/22/2015   Hypothyroid 10/27/2015   Insomnia 11/21/2015   Severe episode of recurrent major depressive disorder, without psychotic features ([place]) 11/29/2015   Rhinitis, allergic 11/29/2015   Vaginal atrophy 01/02/2016   Generalized anxiety disorder 03/19/2016   Panic attacks 03/19/2016   Loose body in knee 04/17/2016   Chondromalacia of patellofemoral joint 04/17/2016   Diverticulitis of colon without hemorrhage 07/19/2016   Diverticulitis 09/10/2017   Light tobacco smoker 10/01/2017   Essential hypertension 10/01/2017   DDD (degenerative disc disease), lumbar 04/01/2018   OSA on CPAP 05/29/2018   Chronic bilateral thoracic back pain 08/04/2018   Osteopenia 03/24/2019   Chronic bilateral low back pain with bilateral  sciatica 12/13/2019   DDD (degenerative disc disease), thoracic 01/04/2020   Mixed hyperlipidemia 01/20/2020   Bilateral lower extremity edema 09/01/2020   Abnormal ECG 11/20/2020   Hypertension goal BP (blood pressure) < 130/80 02/28/2021   Bruised rib 06/05/2021   Aortic atherosclerosis ([place]) 06/22/2021   Coronary artery calcification seen on CT scan 06/22/2021   Emphysema lung ([place]) 06/22/2021   Pulmonary nodules 06/22/2021   Resolved Ambulatory Problems    Diagnosis Date Noted   Unspecified hypothyroidism 12/29/2009   TOBACCO ABUSE 06/13/2010   GRIEF REACTION, ACUTE 12/29/2009   Depression 02/06/2010   Osteoarthrosis involving lower leg 12/29/2009   NAUSEA AND VOMITING 05/02/2010   Osteoarthritis of left knee 07/21/2013   Medication management 11/05/2013   Nausea with vomiting 04/07/2014   Lumbago 11/25/2014   Sacroiliac joint dysfunction of both sides 09/06/2015   Bilateral low back pain without sciatica 09/06/2015   Right knee pain 11/07/2015   Left knee pain 01/23/2016   Abdominal pain, left lower quadrant 04/25/2016   Trochanteric bursitis of left hip 01/29/2017   Nonintractable headache 03/06/2017   Tachycardia 03/06/2017   Rib pain on left side 08/03/2017   Elevated blood pressure reading 10/01/2017   Rib pain on right side 11/04/2017   Traumatic hematoma of eyelid, right, initial encounter 11/04/2017   Low grade fever 04/01/2018   Acute bilateral low back pain with bilateral sciatica 04/01/2018   Closed fracture of one rib of right side 12/01/2018   Upper back pain 11/30/2019   Mid back pain 12/13/2019   Muscle spasm of back 12/13/2019   Chest pain 09/01/2020   Epigastric pain 09/01/2020  Past Medical History:  Diagnosis Date   Migraine    Thyroid disease    Tobacco use       Review of Systems    See HPI.  Objective:   Physical Exam Vitals reviewed.  Constitutional:      Appearance: Normal appearance. She is obese.  HENT:     Head:  Normocephalic.  Cardiovascular:     Rate and Rhythm: Normal rate and regular rhythm.     Pulses: Normal pulses.     Heart sounds: Normal heart sounds.  Pulmonary:     Effort: Pulmonary effort is normal.     Breath sounds: Normal breath sounds.  Neurological:     General: No focal deficit present.     Mental Status: She is alert and oriented to person, place, and time.  Psychiatric:        Mood and Affect: Mood normal.          Assessment & Plan:  Diagnoses and all orders for this visit:  Hypertension goal BP (blood pressure) < 130/80 -     amLODipine (NORVASC) 10 MG tablet; Take 1 tablet (10 mg total) by mouth daily.  Centrilobular emphysema (HCC)  OSA on CPAP  Severe episode of recurrent major depressive disorder, without psychotic features ([place])  -Continue buspar 15mg  TID and lamictal 50mg    Other orders -     umeclidinium bromide (INCRUSE ELLIPTA) 62.5 MCG/ACT AEPB; Inhale 1 puff into the lungs daily. -     traZODone (DESYREL) 50 MG tablet; Take 3 tablets (150 mg total) by mouth at bedtime as needed for sleep.

## 2021-08-31 DIAGNOSIS — G8929 Other chronic pain: Secondary | ICD-10-CM | POA: Diagnosis not present

## 2021-08-31 DIAGNOSIS — M545 Low back pain, unspecified: Secondary | ICD-10-CM | POA: Diagnosis not present

## 2021-08-31 DIAGNOSIS — M47814 Spondylosis without myelopathy or radiculopathy, thoracic region: Secondary | ICD-10-CM | POA: Diagnosis not present

## 2021-08-31 DIAGNOSIS — Z79899 Other long term (current) drug therapy: Secondary | ICD-10-CM | POA: Diagnosis not present

## 2021-08-31 DIAGNOSIS — M546 Pain in thoracic spine: Secondary | ICD-10-CM | POA: Diagnosis not present

## 2021-09-04 ENCOUNTER — Encounter: Payer: Self-pay | Admitting: Physician Assistant

## 2021-09-04 ENCOUNTER — Ambulatory Visit (INDEPENDENT_AMBULATORY_CARE_PROVIDER_SITE_OTHER): Payer: Medicare Other | Admitting: Physician Assistant

## 2021-09-04 ENCOUNTER — Other Ambulatory Visit: Payer: Self-pay

## 2021-09-04 VITALS — BP 112/55 | HR 92 | Ht 67.0 in | Wt 206.0 lb

## 2021-09-04 DIAGNOSIS — M797 Fibromyalgia: Secondary | ICD-10-CM

## 2021-09-04 DIAGNOSIS — E039 Hypothyroidism, unspecified: Secondary | ICD-10-CM

## 2021-09-04 DIAGNOSIS — K5909 Other constipation: Secondary | ICD-10-CM

## 2021-09-04 MED ORDER — LINACLOTIDE 145 MCG PO CAPS: 145.0000 ug | ORAL_CAPSULE | Freq: Every day | ORAL | 1 refills | Status: DC

## 2021-09-04 NOTE — Progress Notes (Signed)
Subjective:    Patient ID: Aimee Little, female    DOB: [birth year], 60 y.o.   MRN: [id number]  HPI Pt is a 60 yo obese female with CAD, HTN, Migraines, fibromyalgia, chronic pain, COPD, OSA, hypothyroidism, MDD, anxiety who presents to the clinic to discuss constipation.   Pt is having a bowel movement every 3-4 days but hard and with straining. Does not feel like full bowel movement. She is on daily norco for pain.   She has felt more overall achy and been less active. She feels like she is in a fibromyalgia flare.   .. Active Ambulatory Problems    Diagnosis Date Noted   HOT FLASHES 12/29/2009   [place] DISEASE, LUMBAR 12/29/2009   Fibromyalgia 01/01/2013   Unspecified vitamin D deficiency 05/16/2013   Migraine without status migrainosus, not intractable 07/21/2013   Chronic pain syndrome 11/05/2013   Anxiety 12/17/2013   Adenomatous colon polyp 12/20/2013   Obesity 11/25/2014   Sinobronchitis 04/13/2015   Chronic fatigue 05/26/2015   RLS (restless legs syndrome) 09/22/2015   Hypothyroid 10/27/2015   Insomnia 11/21/2015   Severe episode of recurrent major depressive disorder, without psychotic features ([place]) 11/29/2015   Rhinitis, allergic 11/29/2015   Vaginal atrophy 01/02/2016   Generalized anxiety disorder 03/19/2016   Panic attacks 03/19/2016   Loose body in knee 04/17/2016   Chondromalacia of patellofemoral joint 04/17/2016   Diverticulitis of colon without hemorrhage 07/19/2016   Diverticulitis 09/10/2017   Light tobacco smoker 10/01/2017   Essential hypertension 10/01/2017   DDD (degenerative disc disease), lumbar 04/01/2018   OSA on CPAP 05/29/2018   Chronic bilateral thoracic back pain 08/04/2018   Osteopenia 03/24/2019   Chronic bilateral low back pain with bilateral sciatica 12/13/2019   DDD (degenerative disc disease), thoracic 01/04/2020   Mixed hyperlipidemia 01/20/2020   Bilateral lower extremity edema 09/01/2020   Abnormal ECG 11/20/2020    Hypertension goal BP (blood pressure) < 130/80 02/28/2021   Bruised rib 06/05/2021   Aortic atherosclerosis ([place]) 06/22/2021   Coronary artery calcification seen on CT scan 06/22/2021   Emphysema lung ([place]) 06/22/2021   Pulmonary nodules 06/22/2021   Chronic constipation 09/04/2021   Resolved Ambulatory Problems    Diagnosis Date Noted   Unspecified hypothyroidism 12/29/2009   TOBACCO ABUSE 06/13/2010   GRIEF REACTION, ACUTE 12/29/2009   Depression 02/06/2010   Osteoarthrosis involving lower leg 12/29/2009   NAUSEA AND VOMITING 05/02/2010   Osteoarthritis of left knee 07/21/2013   Medication management 11/05/2013   Nausea with vomiting 04/07/2014   Lumbago 11/25/2014   Sacroiliac joint dysfunction of both sides 09/06/2015   Bilateral low back pain without sciatica 09/06/2015   Right knee pain 11/07/2015   Left knee pain 01/23/2016   Abdominal pain, left lower quadrant 04/25/2016   Trochanteric bursitis of left hip 01/29/2017   Nonintractable headache 03/06/2017   Tachycardia 03/06/2017   Rib pain on left side 08/03/2017   Elevated blood pressure reading 10/01/2017   Rib pain on right side 11/04/2017   Traumatic hematoma of eyelid, right, initial encounter 11/04/2017   Low grade fever 04/01/2018   Acute bilateral low back pain with bilateral sciatica 04/01/2018   Closed fracture of one rib of right side 12/01/2018   Upper back pain 11/30/2019   Mid back pain 12/13/2019   Muscle spasm of back 12/13/2019   Chest pain 09/01/2020   Epigastric pain 09/01/2020   Past Medical History:  Diagnosis Date   Migraine    Thyroid disease  Tobacco use       Review of Systems See HPI.     Objective:   Physical Exam Vitals reviewed.  Constitutional:      Appearance: Normal appearance. She is obese.  Cardiovascular:     Rate and Rhythm: Normal rate and regular rhythm.  Pulmonary:     Effort: Pulmonary effort is normal.     Breath sounds: Normal breath sounds.  Abdominal:      Palpations: Abdomen is soft.     Tenderness: There is no right CVA tenderness or left CVA tenderness.  Neurological:     General: No focal deficit present.     Mental Status: She is alert and oriented to person, place, and time.  Psychiatric:        Mood and Affect: Mood normal.          Assessment & Plan:  Marland KitchenMarland KitchenMelissa was seen today for generalized body aches.  Diagnoses and all orders for this visit:  Chronic constipation -     linaclotide (LINZESS) 145 MCG CAPS capsule; Take 1 capsule (145 mcg total) by mouth daily before breakfast.  Fibromyalgia  Hypothyroidism, unspecified type -     Thyroid Panel With TSH   Will recheck TSH to make sure not contributing.  Start linzess daily. Given coupon card.  Discussed how to use and the most common side effect.   Continue to stay hydrated, avoid sugar, exercise daily to help with fibromyalgia flare.

## 2021-09-04 NOTE — Patient Instructions (Signed)

## 2021-09-05 ENCOUNTER — Other Ambulatory Visit: Payer: Self-pay | Admitting: Physician Assistant

## 2021-09-05 ENCOUNTER — Encounter: Payer: Self-pay | Admitting: Physician Assistant

## 2021-09-05 DIAGNOSIS — M797 Fibromyalgia: Secondary | ICD-10-CM

## 2021-09-05 DIAGNOSIS — M545 Low back pain, unspecified: Secondary | ICD-10-CM

## 2021-09-05 DIAGNOSIS — G894 Chronic pain syndrome: Secondary | ICD-10-CM

## 2021-09-05 DIAGNOSIS — M51379 Other intervertebral disc degeneration, lumbosacral region without mention of lumbar back pain or lower extremity pain: Secondary | ICD-10-CM

## 2021-09-05 DIAGNOSIS — M5137 Other intervertebral disc degeneration, lumbosacral region: Secondary | ICD-10-CM

## 2021-09-05 LAB — THYROID PANEL WITH TSH
Free Thyroxine Index: 2.4 (ref 1.4–3.8)
T3 Uptake: 29 % (ref 22–35)
T4, Total: 8.3 ug/dL (ref 5.1–11.9)
TSH: 11.51 mIU/L — ABNORMAL HIGH (ref 0.40–4.50)

## 2021-09-05 NOTE — Progress Notes (Signed)
Need to add the extra half to include Sunday, Tuesday, Thursday and Saturday.recheck labs in 6 weeks.

## 2021-09-05 NOTE — Progress Notes (Signed)
TSH is 11.51. synthroid does need to be increased. Confirm what patient is taking so that I can appropriately increase.

## 2021-09-05 NOTE — Telephone Encounter (Signed)
No longer on patient's medication list, please advise.

## 2021-09-12 ENCOUNTER — Other Ambulatory Visit: Payer: Self-pay | Admitting: Physician Assistant

## 2021-09-12 DIAGNOSIS — F410 Panic disorder [episodic paroxysmal anxiety] without agoraphobia: Secondary | ICD-10-CM

## 2021-09-25 ENCOUNTER — Other Ambulatory Visit: Payer: Self-pay | Admitting: Physician Assistant

## 2021-09-25 DIAGNOSIS — K2100 Gastro-esophageal reflux disease with esophagitis, without bleeding: Secondary | ICD-10-CM

## 2021-09-28 ENCOUNTER — Other Ambulatory Visit: Payer: Self-pay | Admitting: Physician Assistant

## 2021-09-28 DIAGNOSIS — F332 Major depressive disorder, recurrent severe without psychotic features: Secondary | ICD-10-CM

## 2021-10-05 ENCOUNTER — Other Ambulatory Visit: Payer: Self-pay | Admitting: Physician Assistant

## 2021-10-05 DIAGNOSIS — E039 Hypothyroidism, unspecified: Secondary | ICD-10-CM

## 2021-10-31 ENCOUNTER — Other Ambulatory Visit: Payer: Self-pay

## 2021-10-31 ENCOUNTER — Ambulatory Visit (INDEPENDENT_AMBULATORY_CARE_PROVIDER_SITE_OTHER): Payer: Medicare Other | Admitting: Physician Assistant

## 2021-10-31 VITALS — BP 124/74 | HR 91 | Wt 222.0 lb

## 2021-10-31 DIAGNOSIS — E039 Hypothyroidism, unspecified: Secondary | ICD-10-CM | POA: Diagnosis not present

## 2021-10-31 DIAGNOSIS — E669 Obesity, unspecified: Secondary | ICD-10-CM | POA: Diagnosis not present

## 2021-10-31 DIAGNOSIS — E66811 Obesity, class 1: Secondary | ICD-10-CM

## 2021-10-31 DIAGNOSIS — M5134 Other intervertebral disc degeneration, thoracic region: Secondary | ICD-10-CM

## 2021-10-31 DIAGNOSIS — E6609 Other obesity due to excess calories: Secondary | ICD-10-CM

## 2021-10-31 DIAGNOSIS — G8929 Other chronic pain: Secondary | ICD-10-CM

## 2021-10-31 DIAGNOSIS — M546 Pain in thoracic spine: Secondary | ICD-10-CM | POA: Diagnosis not present

## 2021-10-31 DIAGNOSIS — F410 Panic disorder [episodic paroxysmal anxiety] without agoraphobia: Secondary | ICD-10-CM

## 2021-10-31 DIAGNOSIS — M5136 Other intervertebral disc degeneration, lumbar region: Secondary | ICD-10-CM | POA: Diagnosis not present

## 2021-10-31 DIAGNOSIS — M797 Fibromyalgia: Secondary | ICD-10-CM

## 2021-10-31 DIAGNOSIS — M545 Low back pain, unspecified: Secondary | ICD-10-CM | POA: Diagnosis not present

## 2021-10-31 DIAGNOSIS — Z6834 Body mass index (BMI) 34.0-34.9, adult: Secondary | ICD-10-CM

## 2021-10-31 DIAGNOSIS — M51369 Other intervertebral disc degeneration, lumbar region without mention of lumbar back pain or lower extremity pain: Secondary | ICD-10-CM

## 2021-10-31 MED ORDER — KETOROLAC TROMETHAMINE 60 MG/2ML IM SOLN
60.0000 mg | Freq: Once | INTRAMUSCULAR | Status: AC
  Administered 2021-10-31: 60 mg via INTRAMUSCULAR

## 2021-10-31 MED ORDER — METHYLPREDNISOLONE ACETATE 80 MG/ML IJ SUSP
80.0000 mg | Freq: Once | INTRAMUSCULAR | Status: AC
  Administered 2021-10-31: 80 mg via INTRAMUSCULAR

## 2021-10-31 MED ORDER — SAXENDA 18 MG/3ML ~~LOC~~ SOPN: 3.0000 mg | PEN_INJECTOR | Freq: Every day | SUBCUTANEOUS | 0 refills | Status: DC

## 2021-10-31 MED ORDER — ALPRAZOLAM 0.5 MG PO TABS: 0.5000 mg | ORAL_TABLET | Freq: Two times a day (BID) | ORAL | 1 refills | Status: DC | PRN

## 2021-10-31 NOTE — Progress Notes (Signed)
Subjective:    Patient ID: Aimee Little, female    DOB: 1961/09/02, 61 y.o.   MRN: [id number]  HPI Pt is a 61 yo female with chronic pain due to DDD and fibromyalgia with MDD and GAD who presents to the clinic in pain flare. Flare started Monday. Her typical treatment is not helping. She needs another injection. She is here for shots.   .. Active Ambulatory Problems    Diagnosis Date Noted   HOT FLASHES 12/29/2009   [place] DISEASE, LUMBAR 12/29/2009   Fibromyalgia 01/01/2013   Unspecified vitamin D deficiency 05/16/2013   Migraine without status migrainosus, not intractable 07/21/2013   Chronic pain syndrome 11/05/2013   Anxiety 12/17/2013   Adenomatous colon polyp 12/20/2013   Class 1 obesity due to excess calories with serious comorbidity and body mass index (BMI) of 34.0 to 34.9 in adult 11/25/2014   Sinobronchitis 04/13/2015   Chronic fatigue 05/26/2015   RLS (restless legs syndrome) 09/22/2015   Hypothyroid 10/27/2015   Insomnia 11/21/2015   Severe episode of recurrent major depressive disorder, without psychotic features ([place]) 11/29/2015   Rhinitis, allergic 11/29/2015   Vaginal atrophy 01/02/2016   Generalized anxiety disorder 03/19/2016   Panic attacks 03/19/2016   Loose body in knee 04/17/2016   Chondromalacia of patellofemoral joint 04/17/2016   Diverticulitis of colon without hemorrhage 07/19/2016   Diverticulitis 09/10/2017   Light tobacco smoker 10/01/2017   Essential hypertension 10/01/2017   DDD (degenerative disc disease), lumbar 04/01/2018   OSA on CPAP 05/29/2018   Chronic bilateral thoracic back pain 08/04/2018   Osteopenia 03/24/2019   Chronic bilateral low back pain with bilateral sciatica 12/13/2019   DDD (degenerative disc disease), thoracic 01/04/2020   Mixed hyperlipidemia 01/20/2020   Bilateral lower extremity edema 09/01/2020   Abnormal ECG 11/20/2020   Hypertension goal BP (blood pressure) < 130/80 02/28/2021   Bruised rib 06/05/2021    Aortic atherosclerosis ([place]) 06/22/2021   Coronary artery calcification seen on CT scan 06/22/2021   Emphysema lung ([place]) 06/22/2021   Pulmonary nodules 06/22/2021   Chronic constipation 09/04/2021   Resolved Ambulatory Problems    Diagnosis Date Noted   Unspecified hypothyroidism 12/29/2009   TOBACCO ABUSE 06/13/2010   GRIEF REACTION, ACUTE 12/29/2009   Depression 02/06/2010   Osteoarthrosis involving lower leg 12/29/2009   NAUSEA AND VOMITING 05/02/2010   Osteoarthritis of left knee 07/21/2013   Medication management 11/05/2013   Nausea with vomiting 04/07/2014   Lumbago 11/25/2014   Sacroiliac joint dysfunction of both sides 09/06/2015   Bilateral low back pain without sciatica 09/06/2015   Right knee pain 11/07/2015   Left knee pain 01/23/2016   Abdominal pain, left lower quadrant 04/25/2016   Trochanteric bursitis of left hip 01/29/2017   Nonintractable headache 03/06/2017   Tachycardia 03/06/2017   Rib pain on left side 08/03/2017   Elevated blood pressure reading 10/01/2017   Rib pain on right side 11/04/2017   Traumatic hematoma of eyelid, right, initial encounter 11/04/2017   Low grade fever 04/01/2018   Acute bilateral low back pain with bilateral sciatica 04/01/2018   Closed fracture of one rib of right side 12/01/2018   Upper back pain 11/30/2019   Mid back pain 12/13/2019   Muscle spasm of back 12/13/2019   Chest pain 09/01/2020   Epigastric pain 09/01/2020   Past Medical History:  Diagnosis Date   Migraine    Thyroid disease    Tobacco use       Review of Systems See HPI.  Objective:   Physical Exam Vitals reviewed.  Constitutional:      Appearance: Normal appearance. She is obese.  HENT:     Head: Normocephalic.  Cardiovascular:     Rate and Rhythm: Normal rate and regular rhythm.     Pulses: Normal pulses.     Heart sounds: Murmur heard.  Pulmonary:     Effort: Pulmonary effort is normal.     Breath sounds: Normal breath sounds.   Musculoskeletal:     Comments: Decreased ROM of waist, arms, legs due to pain.  Tenderness over upper, middle, lower back and legs.   Neurological:     General: No focal deficit present.     Mental Status: She is alert and oriented to person, place, and time.  Psychiatric:        Mood and Affect: Mood normal.          Assessment & Plan:  Marland KitchenMarland KitchenMelissa was seen today for follow-up.  Diagnoses and all orders for this visit:  Chronic bilateral low back pain without sciatica  DDD (degenerative disc disease), thoracic -     ketorolac (TORADOL) injection 60 mg -     methylPREDNISolone acetate (DEPO-MEDROL) injection 80 mg  DDD (degenerative disc disease), lumbar  Chronic bilateral thoracic back pain  Hypothyroidism, unspecified type -     Thyroid Panel With TSH  Panic attacks -     ALPRAZolam (XANAX) 0.5 MG tablet; Take 1 tablet (0.5 mg total) by mouth 2 (two) times daily as needed for anxiety.  Class 1 obesity due to excess calories with serious comorbidity and body mass index (BMI) of 34.0 to 34.9 in adult -     Liraglutide -Weight Management (SAXENDA) 18 MG/3ML SOPN; Inject 3 mg into the skin daily. 0.6 mg injected subcutaneously daily for 1 week, then increase by 0.6 mg weekly until reaching 3 mg injected subcutaneous daily  Fibromyalgia -     ketorolac (TORADOL) injection 60 mg -     methylPREDNISolone acetate (DEPO-MEDROL) injection 80 mg   Toradol and depomedrol given IM today for flare.  Continue with current plan .Marland KitchenDiscussed low carb diet with 1500 calories and 80g of protein.  Exercising at least 150 minutes a week.  [employer] could be a Microbiologist.  Discussed saxenda and side effects and tiration up as well as side effects.  Follow up in 3 months.

## 2021-11-01 DIAGNOSIS — M545 Low back pain, unspecified: Secondary | ICD-10-CM | POA: Diagnosis not present

## 2021-11-01 DIAGNOSIS — M546 Pain in thoracic spine: Secondary | ICD-10-CM | POA: Diagnosis not present

## 2021-11-01 DIAGNOSIS — M47814 Spondylosis without myelopathy or radiculopathy, thoracic region: Secondary | ICD-10-CM | POA: Diagnosis not present

## 2021-11-01 DIAGNOSIS — G8929 Other chronic pain: Secondary | ICD-10-CM | POA: Diagnosis not present

## 2021-11-01 DIAGNOSIS — Z79899 Other long term (current) drug therapy: Secondary | ICD-10-CM | POA: Diagnosis not present

## 2021-11-01 LAB — THYROID PANEL WITH TSH
Free Thyroxine Index: 2.6 (ref 1.4–3.8)
T3 Uptake: 30 % (ref 22–35)
T4, Total: 8.7 ug/dL (ref 5.1–11.9)
TSH: 4.23 mIU/L (ref 0.40–4.50)

## 2021-11-02 ENCOUNTER — Telehealth: Payer: Self-pay

## 2021-11-02 DIAGNOSIS — G8929 Other chronic pain: Secondary | ICD-10-CM

## 2021-11-02 DIAGNOSIS — M5134 Other intervertebral disc degeneration, thoracic region: Secondary | ICD-10-CM

## 2021-11-02 DIAGNOSIS — M5136 Other intervertebral disc degeneration, lumbar region: Secondary | ICD-10-CM

## 2021-11-02 NOTE — Telephone Encounter (Signed)
The patient is wanting to know if you can increase her dosage on her fluid medication. She stated that her feet are swollen, that they have been swollen all week. Her sister stated that she looks like she is retaining fluid.  hydrochlorothiazide (HYDRODIURIL) 25 MG tablet

## 2021-11-02 NOTE — Progress Notes (Signed)
Thyroid looks so much better. Do you need refills?

## 2021-11-05 NOTE — Telephone Encounter (Signed)
I spoke with the patient an advised her that she can double her dosage.   She was also wanting to know if you have heard anything about her Prior authorization for the weight lose injections.  She also wanting me to tell you that her back hurts so bad that she can't bath or do anything at all.

## 2021-11-06 ENCOUNTER — Encounter: Payer: Self-pay | Admitting: Physician Assistant

## 2021-11-06 DIAGNOSIS — M5416 Radiculopathy, lumbar region: Secondary | ICD-10-CM | POA: Diagnosis not present

## 2021-11-06 NOTE — Telephone Encounter (Signed)
She needs to make appt to be able to get injections. I have not heard about weight loss injections yet.

## 2021-11-07 ENCOUNTER — Telehealth: Payer: Self-pay

## 2021-11-07 NOTE — Telephone Encounter (Signed)
If they don't cover saxenda likely will not cover anything else in that class. We could try topamax but other than that I don't think there are other options. Would you like to try topamax?

## 2021-11-07 NOTE — Telephone Encounter (Addendum)
Initiated Prior authorization BOF:[id number] 18MG /3ML pen-injectors Via: Covermymeds Case/Key:[id number] Status: denied as of 11/07/21 Reason:no reason indicated Notified Pt via: Pt does not have Mychart, called pt made aware of denial, pt states she had a epidural and she is continuing to have back pain she feels the back pain may be weight related and would like you prescribe an alternative.

## 2021-11-08 NOTE — Telephone Encounter (Signed)
She seen [facility] on 02/07 and had an injection done there. She said that so far it still hasn't helped. She said that it hurts to walk, go to the bathroom, and standing. It doesn't hurt when she is sitting.  She was wanting to know if you can place an order for an MRI if the injection doesn't work.  She said that this pain just started abowt 2 weeks ago and she doesn't know what happened it just came on all of the sudden.  I also advised the patient that we are still waiting on the PA response.

## 2021-11-09 NOTE — Addendum Note (Signed)
Addended byAnnamaria Helling on: 11/09/2021 04:01 PM   Modules accepted: Orders

## 2021-11-09 NOTE — Telephone Encounter (Signed)
At this point you need to see neurosurgery. Do you have a preference?

## 2021-11-09 NOTE — Telephone Encounter (Signed)
Patient made aware and referral made to Dr. Owens Shark per her request.

## 2021-11-21 ENCOUNTER — Telehealth: Payer: Self-pay | Admitting: Neurology

## 2021-11-21 DIAGNOSIS — M5136 Other intervertebral disc degeneration, lumbar region: Secondary | ICD-10-CM

## 2021-11-21 DIAGNOSIS — M546 Pain in thoracic spine: Secondary | ICD-10-CM

## 2021-11-21 DIAGNOSIS — G8929 Other chronic pain: Secondary | ICD-10-CM

## 2021-11-21 DIAGNOSIS — M5134 Other intervertebral disc degeneration, thoracic region: Secondary | ICD-10-CM

## 2021-11-21 DIAGNOSIS — M545 Low back pain, unspecified: Secondary | ICD-10-CM

## 2021-11-21 DIAGNOSIS — R29898 Other symptoms and signs involving the musculoskeletal system: Secondary | ICD-10-CM

## 2021-11-21 NOTE — Telephone Encounter (Signed)
Received note from Dr. Saul Fordyce office, [facility], stating he requires updated MR prior to being scheduled. Once completed, they need sent to Dr. Saul Fordyce office with referral. Faythe Ghee to order per [facility]. Called patient to see if she is okay with [place] ordering. LVM to call back.

## 2021-11-22 NOTE — Telephone Encounter (Signed)
Spoke with patient, she is agreeable to repeat MR's.   She has been treated by [place] and pain management for sudden worsening of back pain with weakness in legs. MR's ordered.

## 2021-11-30 DIAGNOSIS — M545 Low back pain, unspecified: Secondary | ICD-10-CM | POA: Diagnosis not present

## 2021-11-30 DIAGNOSIS — M546 Pain in thoracic spine: Secondary | ICD-10-CM | POA: Diagnosis not present

## 2021-11-30 DIAGNOSIS — G8929 Other chronic pain: Secondary | ICD-10-CM | POA: Diagnosis not present

## 2021-11-30 DIAGNOSIS — M47814 Spondylosis without myelopathy or radiculopathy, thoracic region: Secondary | ICD-10-CM | POA: Diagnosis not present

## 2021-11-30 DIAGNOSIS — Z79899 Other long term (current) drug therapy: Secondary | ICD-10-CM | POA: Diagnosis not present

## 2021-12-02 ENCOUNTER — Ambulatory Visit (INDEPENDENT_AMBULATORY_CARE_PROVIDER_SITE_OTHER): Payer: Medicare Other

## 2021-12-02 ENCOUNTER — Other Ambulatory Visit: Payer: Self-pay

## 2021-12-02 ENCOUNTER — Ambulatory Visit: Payer: Medicare Other

## 2021-12-02 DIAGNOSIS — M545 Low back pain, unspecified: Secondary | ICD-10-CM

## 2021-12-02 DIAGNOSIS — M48061 Spinal stenosis, lumbar region without neurogenic claudication: Secondary | ICD-10-CM

## 2021-12-02 DIAGNOSIS — M5136 Other intervertebral disc degeneration, lumbar region: Secondary | ICD-10-CM

## 2021-12-02 DIAGNOSIS — R29898 Other symptoms and signs involving the musculoskeletal system: Secondary | ICD-10-CM | POA: Diagnosis not present

## 2021-12-02 DIAGNOSIS — M47817 Spondylosis without myelopathy or radiculopathy, lumbosacral region: Secondary | ICD-10-CM | POA: Diagnosis not present

## 2021-12-02 DIAGNOSIS — M546 Pain in thoracic spine: Secondary | ICD-10-CM

## 2021-12-02 DIAGNOSIS — G8929 Other chronic pain: Secondary | ICD-10-CM | POA: Diagnosis not present

## 2021-12-02 DIAGNOSIS — M5134 Other intervertebral disc degeneration, thoracic region: Secondary | ICD-10-CM

## 2021-12-03 NOTE — Telephone Encounter (Signed)
MR's completed, please send with referral so patient can get scheduled with Dr. Owens Shark.  ?

## 2021-12-03 NOTE — Progress Notes (Signed)
Need to get disc and take them to orthopedic or neurosurgeon. Do you have appt set up?

## 2021-12-03 NOTE — Progress Notes (Signed)
Continues to have degenerative findings. Need to get disc and take to ortho or neurosurgeon or both for treatment plan update.

## 2021-12-17 ENCOUNTER — Telehealth: Payer: Self-pay

## 2021-12-17 MED ORDER — HYDROCHLOROTHIAZIDE 50 MG PO TABS: 50.0000 mg | ORAL_TABLET | Freq: Every day | ORAL | 1 refills | Status: DC

## 2021-12-17 NOTE — Telephone Encounter (Signed)
Aimee Little called and states she needs a refill on HCTZ. However, she reports that [name] increased the HCTZ 25 mg to 2 tablets daily.  ? ?Pended prescription.  ?

## 2021-12-17 NOTE — Telephone Encounter (Signed)
Patient advised.

## 2021-12-24 ENCOUNTER — Other Ambulatory Visit: Payer: Self-pay | Admitting: Family Medicine

## 2021-12-24 DIAGNOSIS — G894 Chronic pain syndrome: Secondary | ICD-10-CM

## 2021-12-24 DIAGNOSIS — M545 Low back pain, unspecified: Secondary | ICD-10-CM

## 2021-12-24 DIAGNOSIS — M797 Fibromyalgia: Secondary | ICD-10-CM

## 2021-12-24 DIAGNOSIS — M5137 Other intervertebral disc degeneration, lumbosacral region: Secondary | ICD-10-CM

## 2021-12-27 ENCOUNTER — Other Ambulatory Visit: Payer: Self-pay | Admitting: Physician Assistant

## 2021-12-27 DIAGNOSIS — F332 Major depressive disorder, recurrent severe without psychotic features: Secondary | ICD-10-CM

## 2021-12-31 ENCOUNTER — Other Ambulatory Visit: Payer: Self-pay | Admitting: *Deleted

## 2021-12-31 DIAGNOSIS — E039 Hypothyroidism, unspecified: Secondary | ICD-10-CM

## 2021-12-31 MED ORDER — LEVOTHYROXINE SODIUM 150 MCG PO TABS: ORAL_TABLET | ORAL | 0 refills | Status: DC

## 2021-12-31 NOTE — Progress Notes (Signed)
Pt called in stating that she need a refill on her Levothyroxine 150 mcg to be sent into her pharmacy because she is out.  ? ?Medication sent to [facility]. ?

## 2022-01-02 DIAGNOSIS — G8929 Other chronic pain: Secondary | ICD-10-CM | POA: Diagnosis not present

## 2022-01-02 DIAGNOSIS — M546 Pain in thoracic spine: Secondary | ICD-10-CM | POA: Diagnosis not present

## 2022-01-02 DIAGNOSIS — Z79899 Other long term (current) drug therapy: Secondary | ICD-10-CM | POA: Diagnosis not present

## 2022-01-02 DIAGNOSIS — M545 Low back pain, unspecified: Secondary | ICD-10-CM | POA: Diagnosis not present

## 2022-01-02 DIAGNOSIS — M47814 Spondylosis without myelopathy or radiculopathy, thoracic region: Secondary | ICD-10-CM | POA: Diagnosis not present

## 2022-01-08 ENCOUNTER — Ambulatory Visit (INDEPENDENT_AMBULATORY_CARE_PROVIDER_SITE_OTHER): Payer: Medicare Other | Admitting: Medical-Surgical

## 2022-01-08 VITALS — BP 123/79 | HR 97 | Wt 222.0 lb

## 2022-01-08 DIAGNOSIS — W19XXXA Unspecified fall, initial encounter: Secondary | ICD-10-CM

## 2022-01-08 DIAGNOSIS — M7918 Myalgia, other site: Secondary | ICD-10-CM

## 2022-01-08 DIAGNOSIS — W1812XA Fall from or off toilet with subsequent striking against object, initial encounter: Secondary | ICD-10-CM

## 2022-01-08 NOTE — Progress Notes (Signed)
?  HPI with pertinent ROS:  ? ?CC: Fall at home ? ?HPI: ?Pleasant 61 year old female presenting today after falling at home on Sunday night.  She woke up in the middle of the night and tried to go to the bathroom.  When she was trying to sit on the toilet, she lost her balance and fell, hitting 1 side of her buttocks on the toilet and then the other side on the floor when she landed.  Notes that she has had some soreness to the left lower back into the left buttock since then.  She does not have any pain that is radiating down her leg.  This morning when she woke, the pain was significantly worse and so she decided to come in to be seen.  Unfortunately, since then she has been up and moving around and notes that her pain has gotten much better with walking.  She has not tried conservative measures at home.  She does have meloxicam at home that she takes as well as chronic treatment with Norco.  No numbness, tingling, weakness to the lower extremities.  No loss of bowel or bladder control. ? ?I reviewed the past medical history, family history, social history, surgical history, and allergies today and no changes were needed.  Please see the problem list section below in epic for further details. ? ? ?Physical exam:  ? ?General: Well Developed, well nourished, and in no acute distress.  ?Neuro: Alert and oriented x3.  ?HEENT: Normocephalic, atraumatic.  ?Skin: Warm and dry.  No obvious bruising to the left lower back or buttock. ?Cardiac: Regular rate and rhythm.  ?Respiratory: Not using accessory muscles, speaking in full sentences. ? ?Impression and Recommendations:   ? ?1. Fall in home, initial encounter ?Discussed her fall.  Offered x-ray of the area involved however she feels that the pain is better and would like to hold off on this.  Recommend conservative treatment.  Printed information regarding RICE provided with AVS.  Continue current pain management per pain management providers.  Advised that pain will  likely be worse after prolonged periods of rest but expect gradual resolution over the next 7 to 10 days.  If pain worsens or fails to improve, she is welcome to come by for an x-ray.  She will let us know and I will be glad to put that order in. ? ?Return if symptoms worsen or fail to improve. ?___________________________________________ ?Clearnce Sorrel, DNP, APRN, FNP-BC ?Primary Care and Sports Medicine ?[place] ?

## 2022-01-08 NOTE — Patient Instructions (Signed)
RICE Therapy for Routine Care of Injuries Many injuries can be cared for with rest, ice, compression, and elevation (RICE therapy). This includes: Resting the injured body part. Putting ice on the injury. Putting pressure (compression) on the injury. Raising the injured part (elevation). Using RICE therapy can help to lessen pain and swelling. Supplies needed: Ice. Plastic bag. Towel. Elastic bandage. Pillow or pillows to raise your injured body part. How to care for your injury with RICE therapy Rest Try to rest the injured part of your body. You can go back to your normal activities when your doctor says it is okay to do them and when you can do themwithout pain. If you rest the injury too much, it may not heal as well. Some injuries heal better with early movement instead of resting for too long. Ask your doctor ifyou should do exercises to help your injury get better. Ice  If told, put ice on the injured area. To do this: Put ice in a plastic bag. Place a towel between your skin and the bag. Leave the ice on for 20 minutes, 2-3 times a day. Take off the ice if your skin turns bright red. This is very important. If you cannot feel pain, heat, or cold, you have a greater risk of damage to the area. Do not put ice on your bare skin. Use ice for as many days as your doctor tells you to use it.  Compression Put pressure on the injured area. This can be done with an elastic bandage. If this type of bandage has been put on your injury: Follow instructions on the package the bandage came in about how to use it. Do not wrap the bandage too tightly. Wrap the bandage more loosely if part of your body beyond the bandage is blue, swollen, cold, painful, or loses feeling. Take off the bandage and put it on again every 3-4 hours or as told by your doctor. See your doctor if the bandage seems to make your problems worse.  Elevation Raise the injured area above the level of your heart while you  are sitting orlying down. Follow these instructions at home: If your symptoms get worse or last a long time, make a follow-up appointment with your doctor. You may need to have imaging tests, such as X-rays or an MRI. If you have imaging tests, ask how to get your results when they are ready. Return to your normal activities when your doctor says that it is safe. Keep all follow-up visits. Contact a doctor if: You keep having pain and swelling. Your symptoms get worse. Get help right away if: You have sudden, very bad pain at your injury or lower than your injury. You have redness or more swelling around your injury. You have tingling or numbness at your injury or lower than your injury, and it does not go away when you take off the bandage. Summary Many injuries can be cared for using rest, ice, compression, and elevation (RICE therapy). You can go back to your normal activities when your doctor says it is okay and when you can do them without pain. Put ice on the injured area as told by your doctor. Get help if your symptoms get worse or if you keep having pain and swelling. This information is not intended to replace advice given to you by your health care provider. Make sure you discuss any questions you have with your healthcare provider. Document Revised: 07/06/2020 Document Reviewed: 07/06/2020 Elsevier Patient Education    2022 [employer]  

## 2022-01-13 ENCOUNTER — Other Ambulatory Visit: Payer: Self-pay | Admitting: Physician Assistant

## 2022-01-13 DIAGNOSIS — I10 Essential (primary) hypertension: Secondary | ICD-10-CM

## 2022-01-22 DIAGNOSIS — M5136 Other intervertebral disc degeneration, lumbar region: Secondary | ICD-10-CM | POA: Diagnosis not present

## 2022-01-22 DIAGNOSIS — M5134 Other intervertebral disc degeneration, thoracic region: Secondary | ICD-10-CM | POA: Diagnosis not present

## 2022-02-01 DIAGNOSIS — M545 Low back pain, unspecified: Secondary | ICD-10-CM | POA: Diagnosis not present

## 2022-02-01 DIAGNOSIS — G8929 Other chronic pain: Secondary | ICD-10-CM | POA: Diagnosis not present

## 2022-02-01 DIAGNOSIS — M47814 Spondylosis without myelopathy or radiculopathy, thoracic region: Secondary | ICD-10-CM | POA: Diagnosis not present

## 2022-02-01 DIAGNOSIS — Z79899 Other long term (current) drug therapy: Secondary | ICD-10-CM | POA: Diagnosis not present

## 2022-02-01 DIAGNOSIS — M546 Pain in thoracic spine: Secondary | ICD-10-CM | POA: Diagnosis not present

## 2022-02-04 ENCOUNTER — Encounter: Payer: Self-pay | Admitting: Physician Assistant

## 2022-02-04 ENCOUNTER — Ambulatory Visit (INDEPENDENT_AMBULATORY_CARE_PROVIDER_SITE_OTHER): Payer: Medicare Other | Admitting: Physician Assistant

## 2022-02-04 VITALS — BP 128/78 | HR 117 | Ht 67.0 in | Wt 208.0 lb

## 2022-02-04 DIAGNOSIS — E782 Mixed hyperlipidemia: Secondary | ICD-10-CM | POA: Diagnosis not present

## 2022-02-04 DIAGNOSIS — Z79899 Other long term (current) drug therapy: Secondary | ICD-10-CM

## 2022-02-04 DIAGNOSIS — E559 Vitamin D deficiency, unspecified: Secondary | ICD-10-CM

## 2022-02-04 DIAGNOSIS — E039 Hypothyroidism, unspecified: Secondary | ICD-10-CM

## 2022-02-04 DIAGNOSIS — I10 Essential (primary) hypertension: Secondary | ICD-10-CM | POA: Diagnosis not present

## 2022-02-04 DIAGNOSIS — R195 Other fecal abnormalities: Secondary | ICD-10-CM

## 2022-02-04 DIAGNOSIS — Z0000 Encounter for general adult medical examination without abnormal findings: Secondary | ICD-10-CM

## 2022-02-04 DIAGNOSIS — M5134 Other intervertebral disc degeneration, thoracic region: Secondary | ICD-10-CM

## 2022-02-04 DIAGNOSIS — M5136 Other intervertebral disc degeneration, lumbar region: Secondary | ICD-10-CM

## 2022-02-04 DIAGNOSIS — Z13820 Encounter for screening for osteoporosis: Secondary | ICD-10-CM

## 2022-02-04 DIAGNOSIS — M545 Low back pain, unspecified: Secondary | ICD-10-CM | POA: Insufficient documentation

## 2022-02-04 DIAGNOSIS — Z1211 Encounter for screening for malignant neoplasm of colon: Secondary | ICD-10-CM

## 2022-02-04 MED ORDER — FLUCONAZOLE 150 MG PO TABS: 150.0000 mg | ORAL_TABLET | Freq: Once | ORAL | 0 refills | Status: AC

## 2022-02-04 NOTE — Progress Notes (Signed)
Complete physical exam  Patient: Aimee Little   DOB: 1961-09-23   61 y.o. Female  MRN: [id number]  Subjective:    Chief Complaint  Patient presents with   Annual Exam    Aimee Little is a 61 y.o. female who presents today for a complete physical exam. She reports consuming a general diet. The patient does not participate in regular exercise at present. She generally feels poorly. She reports sleeping poorly. She does have additional problems to discuss today.   She saw Dr. Owens Shark, neurosurgeon,  and considering spinal fusion surgery.   She needs diflucan for post antibiotic yeast infection.    Most recent fall risk assessment:    09/04/2021    2:23 PM  [place] in the past year? 0  Number falls in past yr: 0  Injury with Fall? 0  Risk for fall due to : No Fall Risks  Follow up Falls evaluation completed     Most recent depression screenings:    10/31/2021    3:04 PM 09/04/2021    2:23 PM  PHQ 2/9 Scores  PHQ - 2 Score 1 1  PHQ- 9 Score 8 7    Vision:Within last year and Dental: Current dental problems and Receives regular dental care  Patient Active Problem List   Diagnosis Date Noted   Chronic bilateral low back pain without sciatica 02/04/2022   Chronic constipation 09/04/2021   Aortic atherosclerosis ([place]) 06/22/2021   Coronary artery calcification seen on CT scan 06/22/2021   Emphysema lung ([place]) 06/22/2021   Pulmonary nodules 06/22/2021   Bruised rib 06/05/2021   Hypertension goal BP (blood pressure) < 130/80 02/28/2021   Abnormal ECG 11/20/2020   Bilateral lower extremity edema 09/01/2020   Mixed hyperlipidemia 01/20/2020   DDD (degenerative disc disease), thoracic 01/04/2020   Chronic bilateral low back pain with bilateral sciatica 12/13/2019   Osteopenia 03/24/2019   Chronic bilateral thoracic back pain 08/04/2018   OSA on CPAP 05/29/2018   DDD (degenerative disc disease), lumbar 04/01/2018   Light tobacco smoker 10/01/2017   Essential  hypertension 10/01/2017   Diverticulitis 09/10/2017   Diverticulitis of colon without hemorrhage 07/19/2016   Loose body in knee 04/17/2016   Chondromalacia of patellofemoral joint 04/17/2016   Generalized anxiety disorder 03/19/2016   Panic attacks 03/19/2016   Vaginal atrophy 01/02/2016   Severe episode of recurrent major depressive disorder, without psychotic features ([place]) 11/29/2015   Rhinitis, allergic 11/29/2015   Insomnia 11/21/2015   Hypothyroid 10/27/2015   RLS (restless legs syndrome) 09/22/2015   Chronic fatigue 05/26/2015   Sinobronchitis 04/13/2015   Class 1 obesity due to excess calories with serious comorbidity and body mass index (BMI) of 34.0 to 34.9 in adult 11/25/2014   Adenomatous colon polyp 12/20/2013   Anxiety 12/17/2013   Chronic pain syndrome 11/05/2013   Migraine without status migrainosus, not intractable 07/21/2013   Unspecified vitamin D deficiency 05/16/2013   Fibromyalgia 01/01/2013   HOT FLASHES 12/29/2009   [place] DISEASE, LUMBAR 12/29/2009   Past Medical History:  Diagnosis Date   DDD (degenerative disc disease), lumbar    Depression    Fibromyalgia    Insomnia    Migraine    Thyroid disease    Tobacco use    Allergies  Allergen Reactions   Doxepin Other (See Comments)   Pristiq [Desvenlafaxine Succinate Er]     Depression worse.   Remeron [Mirtazapine]     Weird dreams   Estradiol Other (See  Comments)    Burning    Gabapentin     Sleep walking   Levothyroxine     Other reaction(s): Other (See Comments) Reaction unknown   Lexapro [Escitalopram Oxalate]     jittery   Lisinopril     cough   Losartan     itching   Mirapex [Pramipexole Dihydrochloride]     Nausea and vomiting   Phentermine Diarrhea    Stomach ache/increased anxiety.    Pregabalin    Pristiq [Desvenlafaxine] Other (See Comments)    Increased depression   Seroquel [Quetiapine Fumarate]     hallicinations   Trintellix [Vortioxetine]     RLS/increased anxiety       Patient Care Team: Lavada Mesi as PCP - General (Family Medicine)   Outpatient Medications Prior to Visit  Medication Sig   ALPRAZolam (XANAX) 0.5 MG tablet Take 1 tablet (0.5 mg total) by mouth 2 (two) times daily as needed for anxiety.   AMBULATORY NON FORMULARY MEDICATION Tens unit for lumbar spine for low back pain, muscle spasm, lumbar DDD.   amLODipine (NORVASC) 10 MG tablet Take 1 tablet (10 mg total) by mouth daily.   atorvastatin (LIPITOR) 40 MG tablet Take 1 tablet (40 mg total) by mouth daily.   busPIRone (BUSPAR) 15 MG tablet Take 1 tablet (15 mg total) by mouth 3 (three) times daily.   CALCIUM-MAGNESIUM-ZINC PO Take by mouth daily.   cholecalciferol (VITAMIN D3) 25 MCG (1000 UT) tablet Take 1,000 Units by mouth daily.   cyclobenzaprine (FLEXERIL) 10 MG tablet Take 1 tablet (10 mg total) by mouth 3 (three) times daily as needed for muscle spasms.   diclofenac sodium (VOLTAREN) 1 % GEL Apply 4 g topically 4 (four) times daily. To affected joint. (Patient taking differently: Apply 4 g topically 4 (four) times daily as needed. To affected joint.)   famotidine (PEPCID) 20 MG tablet Take 1 tablet (20 mg total) by mouth 2 (two) times daily.   fluticasone (FLONASE) 50 MCG/ACT nasal spray Place 2 sprays into both nostrils daily.   gabapentin (NEURONTIN) 300 MG capsule TAKE 1 CAPSULE BY MOUTH 3 TIMES A DAY AS NEEDED FOR PAIN/NEUROPATHY   hydrochlorothiazide (HYDRODIURIL) 50 MG tablet Take 1 tablet (50 mg total) by mouth daily.   HYDROcodone-acetaminophen (NORCO) 10-325 MG tablet Take 1 tablet by mouth in the morning, at noon, and at bedtime.   lamoTRIgine (LAMICTAL) 25 MG tablet Take 2 tablets (50 mg total) by mouth daily.   levothyroxine (SYNTHROID) 150 MCG tablet Take 1 tab (164mg) by mouth Mon, Wed, Fri. Take 1.5 tabs (2269m) Sun, Tues, Thurs & Sat. Take on empty stomach at least 30 minutes prior to food or other medications.   linaclotide (LINZESS) 145 MCG CAPS  capsule Take 1 capsule (145 mcg total) by mouth daily before breakfast.   meloxicam (MOBIC) 15 MG tablet Take 1 tablet (15 mg total) by mouth daily.   metoCLOPramide (REGLAN) 10 MG tablet TAKE ONE TABLET BY MOUTH FOUR TIMES DAILY - BEFORE meals AND AT BEDTIME   Omega 3-6-9 Fatty Acids (OMEGA 3-6-9 COMPLEX PO) Take 1 capsule by mouth daily.    omeprazole (PRILOSEC) 40 MG capsule Take 1 capsule (40 mg total) by mouth in the morning and at bedtime. Can go down to one a day after 2 weeks.   rizatriptan (MAXALT-MLT) 10 MG disintegrating tablet May repeat in 2 hours if needed for migraine   rOPINIRole (REQUIP) 1 MG tablet Take 1 tablet (1 mg total) by  mouth 3 (three) times daily.   topiramate (TOPAMAX) 100 MG tablet Take one tablet twice a day.   traZODone (DESYREL) 50 MG tablet Take 3 tablets (150 mg total) by mouth at bedtime as needed for sleep.   umeclidinium bromide (INCRUSE ELLIPTA) 62.5 MCG/ACT AEPB Inhale 1 puff into the lungs daily.   valACYclovir (VALTREX) 1000 MG tablet TAKE TWO TABLETS 2 TIMES A DAY AT ONSET OF COLD SORE FOR 2 DAYS   Vilazodone HCl (VIIBRYD) 40 MG TABS Take 1 tablet (40 mg total) by mouth daily.   [DISCONTINUED] Liraglutide -Weight Management (SAXENDA) 18 MG/3ML SOPN Inject 3 mg into the skin daily. 0.6 mg injected subcutaneously daily for 1 week, then increase by 0.6 mg weekly until reaching 3 mg injected subcutaneous daily   No facility-administered medications prior to visit.    ROS  Continues to have mid to low back pain Continues to have depression and fatigue      Objective:     BP 128/78   Pulse (!) 117   Ht '5\' 7"'$  (1.702 m)   Wt 208 lb (94.3 kg)   SpO2 99%   BMI 32.58 kg/m  BP Readings from Last 3 Encounters:  02/04/22 128/78  01/08/22 123/79  10/31/21 124/74   Wt Readings from Last 3 Encounters:  02/04/22 208 lb (94.3 kg)  01/08/22 222 lb (100.7 kg)  10/31/21 222 lb (100.7 kg)      Physical Exam  BP 128/78   Pulse (!) 117   Ht '5\' 7"'$   (1.702 m)   Wt 208 lb (94.3 kg)   SpO2 99%   BMI 32.58 kg/m   General Appearance:    Alert, cooperative, no distress, appears stated age  Head:    Normocephalic, without obvious abnormality, atraumatic  Eyes:    PERRL, conjunctiva/corneas clear, EOM's intact, fundi    benign, both eyes  Ears:    Normal TM's and external ear canals, both ears  Nose:   Nares normal, septum midline, mucosa normal, no drainage    or sinus tenderness  Throat:   Lips, mucosa, and tongue normal;poor dentition  Neck:   Supple, symmetrical, trachea midline, no adenopathy;    thyroid:  no enlargement/tenderness/nodules; no carotid   bruit or JVD  Back:     Symmetric, no curvature, ROM normal, no CVA tenderness  Lungs:     Clear to auscultation bilaterally, respirations unlabored  Chest Wall:    No tenderness or deformity   Heart:    Regular rate and rhythm, S1 and S2 normal, no murmur, rub   or gallop     Abdomen:     Soft, non-tender, bowel sounds active all four quadrants,    no masses, no organomegaly        Extremities:   Extremities normal, atraumatic, no cyanosis or edema  Pulses:   2+ and symmetric all extremities  Skin:   Skin color, texture, turgor normal, no rashes or lesions  Lymph nodes:   Cervical, supraclavicular, and axillary nodes normal  Neurologic:   CNII-XII intact, normal strength, sensation and reflexes    throughout      Assessment & Plan:    Routine Health Maintenance and Physical Exam  Immunization History  Administered Date(s) Administered   Influenza Whole 06/13/2010   Influenza,inj,Quad PF,6+ Mos 07/21/2013, 06/08/2014, 06/30/2015, 07/02/2016, 05/20/2017, 05/13/2018, 07/14/2020   Influenza,inj,quad, With Preservative 06/22/2019   Influenza-Unspecified 06/22/2019   Janssen (J&J) SARS-COV-2 Vaccination 12/18/2019   Moderna Sars-Covid-2 Vaccination 07/29/2020   Tdap 01/01/2013  Zoster Recombinat (Shingrix) 07/14/2020, 11/29/2020    Health Maintenance  Topic Date Due    DEXA SCAN  03/23/2021   COVID-19 Vaccine (3 - Booster for Janssen series) 02/20/2022 (Originally 09/23/2020)   COLONOSCOPY (Pts 45-50yr Insurance coverage will need to be confirmed)  02/05/2023 (Originally 08/31/2020)   HIV Screening  02/05/2023 (Originally 12/04/1974)   INFLUENZA VACCINE  04/30/2022   TETANUS/TDAP  01/02/2023   MAMMOGRAM  07/27/2023   Hepatitis C Screening  Completed   Zoster Vaccines- Shingrix  Completed   HPV VACCINES  Aged Out    Discussed health benefits of physical activity, and encouraged her to engage in regular exercise appropriate for her age and condition.  PHQ/GAD ok numbers pt reports down mood No changes made today Declined smoking cessation Fasting labs ordered Cologuard ordered/declined colonoscopy Mammogram after October DeXa ordered with mammogram Shingles vaccine UTD. Pneumonia UTD.  Flu UTD.  Covid x2 vaccine.      JIran Planas PA-C

## 2022-02-04 NOTE — Patient Instructions (Signed)

## 2022-02-05 DIAGNOSIS — I10 Essential (primary) hypertension: Secondary | ICD-10-CM | POA: Diagnosis not present

## 2022-02-05 DIAGNOSIS — E782 Mixed hyperlipidemia: Secondary | ICD-10-CM | POA: Diagnosis not present

## 2022-02-05 DIAGNOSIS — E559 Vitamin D deficiency, unspecified: Secondary | ICD-10-CM | POA: Diagnosis not present

## 2022-02-05 DIAGNOSIS — E039 Hypothyroidism, unspecified: Secondary | ICD-10-CM | POA: Diagnosis not present

## 2022-02-06 ENCOUNTER — Other Ambulatory Visit: Payer: Self-pay | Admitting: Neurology

## 2022-02-06 DIAGNOSIS — R748 Abnormal levels of other serum enzymes: Secondary | ICD-10-CM

## 2022-02-06 LAB — CBC WITH DIFFERENTIAL/PLATELET
Absolute Monocytes: 599 cells/uL (ref 200–950)
Basophils Absolute: 57 cells/uL (ref 0–200)
Basophils Relative: 0.7 %
Eosinophils Absolute: 172 cells/uL (ref 15–500)
Eosinophils Relative: 2.1 %
HCT: 45.9 % — ABNORMAL HIGH (ref 35.0–45.0)
Hemoglobin: 15.8 g/dL — ABNORMAL HIGH (ref 11.7–15.5)
Lymphs Abs: 3206 cells/uL (ref 850–3900)
MCH: 34 pg — ABNORMAL HIGH (ref 27.0–33.0)
MCHC: 34.4 g/dL (ref 32.0–36.0)
MCV: 98.7 fL (ref 80.0–100.0)
MPV: 11.1 fL (ref 7.5–12.5)
Monocytes Relative: 7.3 %
Neutro Abs: 4166 cells/uL (ref 1500–7800)
Neutrophils Relative %: 50.8 %
Platelets: 251 10*3/uL (ref 140–400)
RBC: 4.65 10*6/uL (ref 3.80–5.10)
RDW: 12.1 % (ref 11.0–15.0)
Total Lymphocyte: 39.1 %
WBC: 8.2 10*3/uL (ref 3.8–10.8)

## 2022-02-06 LAB — COMPLETE METABOLIC PANEL WITH GFR
AG Ratio: 2.3 (calc) (ref 1.0–2.5)
ALT: 41 U/L — ABNORMAL HIGH (ref 6–29)
AST: 16 U/L (ref 10–35)
Albumin: 4.1 g/dL (ref 3.6–5.1)
Alkaline phosphatase (APISO): 57 U/L (ref 37–153)
BUN: 14 mg/dL (ref 7–25)
CO2: 25 mmol/L (ref 20–32)
Calcium: 9 mg/dL (ref 8.6–10.4)
Chloride: 107 mmol/L (ref 98–110)
Creat: 0.58 mg/dL (ref 0.50–1.05)
Globulin: 1.8 g/dL (calc) — ABNORMAL LOW (ref 1.9–3.7)
Glucose, Bld: 98 mg/dL (ref 65–99)
Potassium: 3.6 mmol/L (ref 3.5–5.3)
Sodium: 142 mmol/L (ref 135–146)
Total Bilirubin: 0.5 mg/dL (ref 0.2–1.2)
Total Protein: 5.9 g/dL — ABNORMAL LOW (ref 6.1–8.1)
eGFR: 102 mL/min/{1.73_m2} (ref >=60)

## 2022-02-06 LAB — LIPID PANEL W/REFLEX DIRECT LDL
Cholesterol: 125 mg/dL (ref <200)
HDL: 60 mg/dL (ref >=50)
LDL Cholesterol (Calc): 51 mg/dL (calc)
Non-HDL Cholesterol (Calc): 65 mg/dL (calc) (ref <130)
Total CHOL/HDL Ratio: 2.1 (calc) (ref <5.0)
Triglycerides: 48 mg/dL (ref <150)

## 2022-02-06 LAB — TSH: TSH: 1.27 mIU/L (ref 0.40–4.50)

## 2022-02-06 LAB — VITAMIN D 25 HYDROXY (VIT D DEFICIENCY, FRACTURES): Vit D, 25-Hydroxy: 93 ng/mL (ref 30–100)

## 2022-02-06 NOTE — Progress Notes (Signed)
Low protein levels. You need to eat more protein.  ?Thyroid looks great.  ?Cholesterol looks great.  ?Vitamin D looks amazing.  ?Kidney and glucose look great.  ?Liver enzyme elevated. It could be the pain medication. Avoid alcohol. Drink plenty of water and recheck in 2 weeks.  ?

## 2022-02-14 DIAGNOSIS — M546 Pain in thoracic spine: Secondary | ICD-10-CM | POA: Diagnosis not present

## 2022-02-14 DIAGNOSIS — M47814 Spondylosis without myelopathy or radiculopathy, thoracic region: Secondary | ICD-10-CM | POA: Diagnosis not present

## 2022-02-14 DIAGNOSIS — M545 Low back pain, unspecified: Secondary | ICD-10-CM | POA: Diagnosis not present

## 2022-02-14 DIAGNOSIS — G8929 Other chronic pain: Secondary | ICD-10-CM | POA: Diagnosis not present

## 2022-02-17 ENCOUNTER — Other Ambulatory Visit: Payer: Self-pay | Admitting: Physician Assistant

## 2022-02-17 DIAGNOSIS — G8929 Other chronic pain: Secondary | ICD-10-CM

## 2022-02-17 DIAGNOSIS — M62830 Muscle spasm of back: Secondary | ICD-10-CM

## 2022-02-25 DIAGNOSIS — N200 Calculus of kidney: Secondary | ICD-10-CM | POA: Diagnosis not present

## 2022-02-25 DIAGNOSIS — Z79899 Other long term (current) drug therapy: Secondary | ICD-10-CM | POA: Diagnosis not present

## 2022-02-25 DIAGNOSIS — K219 Gastro-esophageal reflux disease without esophagitis: Secondary | ICD-10-CM | POA: Diagnosis not present

## 2022-02-25 DIAGNOSIS — Z886 Allergy status to analgesic agent status: Secondary | ICD-10-CM | POA: Diagnosis not present

## 2022-02-25 DIAGNOSIS — K5790 Diverticulosis of intestine, part unspecified, without perforation or abscess without bleeding: Secondary | ICD-10-CM | POA: Diagnosis not present

## 2022-02-25 DIAGNOSIS — Z888 Allergy status to other drugs, medicaments and biological substances status: Secondary | ICD-10-CM | POA: Diagnosis not present

## 2022-02-25 DIAGNOSIS — E039 Hypothyroidism, unspecified: Secondary | ICD-10-CM | POA: Diagnosis not present

## 2022-02-25 DIAGNOSIS — K5732 Diverticulitis of large intestine without perforation or abscess without bleeding: Secondary | ICD-10-CM | POA: Diagnosis not present

## 2022-02-25 DIAGNOSIS — F17210 Nicotine dependence, cigarettes, uncomplicated: Secondary | ICD-10-CM | POA: Diagnosis not present

## 2022-02-25 DIAGNOSIS — K5792 Diverticulitis of intestine, part unspecified, without perforation or abscess without bleeding: Secondary | ICD-10-CM | POA: Diagnosis not present

## 2022-02-25 DIAGNOSIS — I10 Essential (primary) hypertension: Secondary | ICD-10-CM | POA: Diagnosis not present

## 2022-02-26 ENCOUNTER — Telehealth: Payer: Self-pay | Admitting: Physician Assistant

## 2022-02-26 LAB — COLOGUARD

## 2022-02-26 NOTE — Progress Notes (Signed)
Aimee Little, we received a note from Cologuard that your sample could not be processed.  It had expired.  It has to be run within a certain number of days of collection.

## 2022-02-26 NOTE — Telephone Encounter (Signed)
Patient had CT which showed: - CT Abdomen Pelvis WO IV Contrast ([facility] Protocol) (02/25/2022 10:48 AM EDT) Impressions  02/25/2022 11:00 AM EDT   IMPRESSION:  1.  Diverticulosis with questionable early diverticulitis. Recommend follow-up. 2.  Punctate nonobstructing right renal stone    Called patient to discuss symptoms. LVM for her to call back to get more information.

## 2022-02-26 NOTE — Telephone Encounter (Signed)
Pt has had diarrhea for about a week. She went to the hospital and they prescribed her an anti-biotic. She is not sure if she should take it or if she should take imodium instead.  She has an appt scheduled with [name] on Friday.

## 2022-02-27 NOTE — Addendum Note (Signed)
Addended by: Fonnie Mu on: 02/27/2022 08:40 AM   Modules accepted: Orders

## 2022-02-27 NOTE — Progress Notes (Signed)
Advised pt that Cologuard could not be processed.  Pt is agreeable to recollection.  New order placed.  Charyl Bigger, CMA

## 2022-03-01 ENCOUNTER — Ambulatory Visit: Payer: Medicare Other | Admitting: Physician Assistant

## 2022-03-04 DIAGNOSIS — G8929 Other chronic pain: Secondary | ICD-10-CM | POA: Diagnosis not present

## 2022-03-04 DIAGNOSIS — M546 Pain in thoracic spine: Secondary | ICD-10-CM | POA: Diagnosis not present

## 2022-03-04 DIAGNOSIS — M47814 Spondylosis without myelopathy or radiculopathy, thoracic region: Secondary | ICD-10-CM | POA: Diagnosis not present

## 2022-03-04 DIAGNOSIS — M545 Low back pain, unspecified: Secondary | ICD-10-CM | POA: Diagnosis not present

## 2022-03-06 DIAGNOSIS — Z1211 Encounter for screening for malignant neoplasm of colon: Secondary | ICD-10-CM | POA: Diagnosis not present

## 2022-03-11 DIAGNOSIS — H52203 Unspecified astigmatism, bilateral: Secondary | ICD-10-CM | POA: Diagnosis not present

## 2022-03-14 ENCOUNTER — Other Ambulatory Visit: Payer: Self-pay | Admitting: Physician Assistant

## 2022-03-14 DIAGNOSIS — G2581 Restless legs syndrome: Secondary | ICD-10-CM

## 2022-03-14 LAB — COLOGUARD: COLOGUARD: POSITIVE — AB

## 2022-03-15 ENCOUNTER — Encounter: Payer: Self-pay | Admitting: Physician Assistant

## 2022-03-15 DIAGNOSIS — R195 Other fecal abnormalities: Secondary | ICD-10-CM | POA: Insufficient documentation

## 2022-03-15 NOTE — Progress Notes (Signed)
Your cologuard is positive. This does not mean you you have colon cancer but it does mean that blood or abnormal DNA was detected and colonoscopy needs to be done. What gastroenterologist do you want to go to for colonoscopy?

## 2022-03-15 NOTE — Addendum Note (Signed)
Addended by: Fonnie Mu on: 03/15/2022 09:37 AM   Modules accepted: Orders

## 2022-03-21 ENCOUNTER — Ambulatory Visit (INDEPENDENT_AMBULATORY_CARE_PROVIDER_SITE_OTHER): Payer: Medicare Other | Admitting: Physician Assistant

## 2022-03-21 VITALS — BP 121/54 | HR 98 | Ht 67.0 in | Wt 212.0 lb

## 2022-03-21 DIAGNOSIS — J432 Centrilobular emphysema: Secondary | ICD-10-CM

## 2022-03-21 DIAGNOSIS — F419 Anxiety disorder, unspecified: Secondary | ICD-10-CM | POA: Diagnosis not present

## 2022-03-21 DIAGNOSIS — F32A Depression, unspecified: Secondary | ICD-10-CM

## 2022-03-21 DIAGNOSIS — I10 Essential (primary) hypertension: Secondary | ICD-10-CM | POA: Diagnosis not present

## 2022-03-21 DIAGNOSIS — G43909 Migraine, unspecified, not intractable, without status migrainosus: Secondary | ICD-10-CM

## 2022-03-21 DIAGNOSIS — E039 Hypothyroidism, unspecified: Secondary | ICD-10-CM

## 2022-03-21 DIAGNOSIS — E876 Hypokalemia: Secondary | ICD-10-CM

## 2022-03-21 MED ORDER — TOPIRAMATE 100 MG PO TABS: ORAL_TABLET | ORAL | 1 refills | Status: DC

## 2022-03-21 MED ORDER — HYDROXYZINE PAMOATE 50 MG PO CAPS: 50.0000 mg | ORAL_CAPSULE | Freq: Three times a day (TID) | ORAL | 1 refills | Status: DC | PRN

## 2022-03-21 MED ORDER — LAMOTRIGINE 100 MG PO TABS: 100.0000 mg | ORAL_TABLET | Freq: Every day | ORAL | 0 refills | Status: DC

## 2022-03-21 MED ORDER — BUSPIRONE HCL 15 MG PO TABS: 15.0000 mg | ORAL_TABLET | Freq: Three times a day (TID) | ORAL | 3 refills | Status: DC

## 2022-03-21 NOTE — Progress Notes (Unsigned)
Established Patient Office Visit  Subjective   Patient ID: Aimee Little, female    DOB: Aug 26, 1961  Age: 61 y.o. MRN: [id number]  No chief complaint on file.   HPI Pt is a 61 yo obese female with   1/2 tab week increased Potassium decreased  Stopped xanxa wants to go on hydrozixine Increase lamictal  Still depressed Friend moved  .Marland Kitchen Active Ambulatory Problems    Diagnosis Date Noted   HOT FLASHES 12/29/2009   [place] DISEASE, LUMBAR 12/29/2009   Fibromyalgia 01/01/2013   Unspecified vitamin D deficiency 05/16/2013   Migraine without status migrainosus, not intractable 07/21/2013   Chronic pain syndrome 11/05/2013   Anxiety 12/17/2013   Adenomatous colon polyp 12/20/2013   Class 1 obesity due to excess calories with serious comorbidity and body mass index (BMI) of 34.0 to 34.9 in adult 11/25/2014   Sinobronchitis 04/13/2015   Chronic fatigue 05/26/2015   RLS (restless legs syndrome) 09/22/2015   Hypothyroid 10/27/2015   Insomnia 11/21/2015   Severe episode of recurrent major depressive disorder, without psychotic features ([place]) 11/29/2015   Rhinitis, allergic 11/29/2015   Vaginal atrophy 01/02/2016   Generalized anxiety disorder 03/19/2016   Panic attacks 03/19/2016   Loose body in knee 04/17/2016   Chondromalacia of patellofemoral joint 04/17/2016   Diverticulitis of colon without hemorrhage 07/19/2016   Diverticulitis 09/10/2017   Light tobacco smoker 10/01/2017   Essential hypertension 10/01/2017   DDD (degenerative disc disease), lumbar 04/01/2018   OSA on CPAP 05/29/2018   Chronic bilateral thoracic back pain 08/04/2018   Osteopenia 03/24/2019   Chronic bilateral low back pain with bilateral sciatica 12/13/2019   DDD (degenerative disc disease), thoracic 01/04/2020   Mixed hyperlipidemia 01/20/2020   Bilateral lower extremity edema 09/01/2020   Abnormal ECG 11/20/2020   Hypertension goal BP (blood pressure) < 130/80 02/28/2021   Bruised rib 06/05/2021    Aortic atherosclerosis ([place]) 06/22/2021   Coronary artery calcification seen on CT scan 06/22/2021   Emphysema lung ([place]) 06/22/2021   Pulmonary nodules 06/22/2021   Chronic constipation 09/04/2021   Chronic bilateral low back pain without sciatica 02/04/2022   Positive colorectal cancer screening using Cologuard test 03/15/2022   Anxiety and depression 03/21/2022   Hypokalemia 03/21/2022   Resolved Ambulatory Problems    Diagnosis Date Noted   Unspecified hypothyroidism 12/29/2009   TOBACCO ABUSE 06/13/2010   GRIEF REACTION, ACUTE 12/29/2009   Depression 02/06/2010   Osteoarthrosis involving lower leg 12/29/2009   NAUSEA AND VOMITING 05/02/2010   Osteoarthritis of left knee 07/21/2013   Medication management 11/05/2013   Nausea with vomiting 04/07/2014   Lumbago 11/25/2014   Sacroiliac joint dysfunction of both sides 09/06/2015   Bilateral low back pain without sciatica 09/06/2015   Right knee pain 11/07/2015   Left knee pain 01/23/2016   Abdominal pain, left lower quadrant 04/25/2016   Trochanteric bursitis of left hip 01/29/2017   Nonintractable headache 03/06/2017   Tachycardia 03/06/2017   Rib pain on left side 08/03/2017   Elevated blood pressure reading 10/01/2017   Rib pain on right side 11/04/2017   Traumatic hematoma of eyelid, right, initial encounter 11/04/2017   Low grade fever 04/01/2018   Acute bilateral low back pain with bilateral sciatica 04/01/2018   Closed fracture of one rib of right side 12/01/2018   Upper back pain 11/30/2019   Mid back pain 12/13/2019   Muscle spasm of back 12/13/2019   Chest pain 09/01/2020   Epigastric pain 09/01/2020   Past Medical History:  Diagnosis Date   Migraine    Thyroid disease    Tobacco use      ROS See HPI>    Objective:     BP (!) 121/54   Pulse 98   Ht '5\' 7"'$  (1.702 m)   Wt 212 lb (96.2 kg)   SpO2 97%   BMI 33.20 kg/m  SpO2 Readings from Last 3 Encounters:  03/21/22 97%  02/04/22 99%  01/08/22  97%      Physical Exam Vitals reviewed.  Constitutional:      Appearance: Normal appearance. She is obese.  HENT:     Head: Normocephalic.  Cardiovascular:     Rate and Rhythm: Normal rate and regular rhythm.     Pulses: Normal pulses.  Pulmonary:     Effort: Pulmonary effort is normal.     Breath sounds: Normal breath sounds.  Abdominal:     Palpations: Abdomen is soft.  Musculoskeletal:     Cervical back: Normal range of motion and neck supple.     Right lower leg: No edema.     Left lower leg: No edema.  Neurological:     General: No focal deficit present.     Mental Status: She is alert and oriented to person, place, and time.  Psychiatric:        Mood and Affect: Mood normal.       Assessment & Plan:  Marland KitchenMarland KitchenDiagnoses and all orders for this visit:  Hypothyroidism, unspecified type -     TSH + free T4  Anxiety and depression -     busPIRone (BUSPAR) 15 MG tablet; Take 1 tablet (15 mg total) by mouth 3 (three) times daily. -     COMPLETE METABOLIC PANEL WITH GFR  Migraine without status migrainosus, not intractable, unspecified migraine type -     COMPLETE METABOLIC PANEL WITH GFR -     topiramate (TOPAMAX) 100 MG tablet; Take one tablet twice a day.  Essential hypertension  Hypertension goal BP (blood pressure) < 130/80  Centrilobular emphysema (HCC)  Hypokalemia  Other orders -     lamoTRIgine (LAMICTAL) 100 MG tablet; Take 1 tablet (100 mg total) by mouth daily. -     hydrOXYzine (VISTARIL) 50 MG capsule; Take 1 capsule (50 mg total) by mouth 3 (three) times daily as needed.       No follow-ups on file.    Iran Planas, PA-C

## 2022-03-21 NOTE — Patient Instructions (Signed)
Recheck labs.  Increased lamictal to '100mg'$  daily

## 2022-03-22 ENCOUNTER — Other Ambulatory Visit: Payer: Self-pay | Admitting: Physician Assistant

## 2022-03-22 ENCOUNTER — Other Ambulatory Visit: Payer: Self-pay | Admitting: Neurology

## 2022-03-22 DIAGNOSIS — E039 Hypothyroidism, unspecified: Secondary | ICD-10-CM

## 2022-03-22 DIAGNOSIS — E876 Hypokalemia: Secondary | ICD-10-CM

## 2022-03-22 LAB — COMPLETE METABOLIC PANEL WITH GFR
AG Ratio: 1.9 (calc) (ref 1.0–2.5)
ALT: 23 U/L (ref 6–29)
AST: 14 U/L (ref 10–35)
Albumin: 4.3 g/dL (ref 3.6–5.1)
Alkaline phosphatase (APISO): 63 U/L (ref 37–153)
BUN: 15 mg/dL (ref 7–25)
CO2: 23 mmol/L (ref 20–32)
Calcium: 9.2 mg/dL (ref 8.6–10.4)
Chloride: 110 mmol/L (ref 98–110)
Creat: 0.77 mg/dL (ref 0.50–1.05)
Globulin: 2.3 g/dL (calc) (ref 1.9–3.7)
Glucose, Bld: 94 mg/dL (ref 65–99)
Potassium: 3.1 mmol/L — ABNORMAL LOW (ref 3.5–5.3)
Sodium: 145 mmol/L (ref 135–146)
Total Bilirubin: 0.3 mg/dL (ref 0.2–1.2)
Total Protein: 6.6 g/dL (ref 6.1–8.1)
eGFR: 87 mL/min/{1.73_m2} (ref >=60)

## 2022-03-22 LAB — TSH+FREE T4: TSH W/REFLEX TO FT4: 0.9 mIU/L (ref 0.40–4.50)

## 2022-03-22 MED ORDER — POTASSIUM CHLORIDE CRYS ER 10 MEQ PO TBCR: 10.0000 meq | EXTENDED_RELEASE_TABLET | Freq: Two times a day (BID) | ORAL | 0 refills | Status: DC

## 2022-03-22 MED ORDER — LEVOTHYROXINE SODIUM 150 MCG PO TABS: ORAL_TABLET | ORAL | 1 refills | Status: DC

## 2022-03-26 ENCOUNTER — Telehealth: Payer: Self-pay

## 2022-03-26 ENCOUNTER — Encounter: Payer: Self-pay | Admitting: Physician Assistant

## 2022-03-26 MED ORDER — PREDNISONE 50 MG PO TABS: ORAL_TABLET | ORAL | 0 refills | Status: DC

## 2022-03-30 ENCOUNTER — Other Ambulatory Visit: Payer: Self-pay | Admitting: Physician Assistant

## 2022-03-30 DIAGNOSIS — F5105 Insomnia due to other mental disorder: Secondary | ICD-10-CM

## 2022-03-30 DIAGNOSIS — F332 Major depressive disorder, recurrent severe without psychotic features: Secondary | ICD-10-CM

## 2022-04-03 ENCOUNTER — Telehealth: Payer: Self-pay

## 2022-04-03 MED ORDER — LIDOCAINE 4 % EX PTCH: 1.0000 | MEDICATED_PATCH | CUTANEOUS | 1 refills | Status: DC

## 2022-04-03 NOTE — Telephone Encounter (Signed)
Sent lidoderm patch.

## 2022-04-03 NOTE — Telephone Encounter (Signed)
Aimee Little called and left a message stating she has back pain. She stated her pain management provider is out of the office until Friday. She has an injection scheduled for Friday. She is requesting a prescription for a Lidocaine patch.

## 2022-04-04 NOTE — Telephone Encounter (Signed)
Patient advised.

## 2022-04-05 DIAGNOSIS — G8929 Other chronic pain: Secondary | ICD-10-CM | POA: Diagnosis not present

## 2022-04-05 DIAGNOSIS — M546 Pain in thoracic spine: Secondary | ICD-10-CM | POA: Diagnosis not present

## 2022-04-05 DIAGNOSIS — M545 Low back pain, unspecified: Secondary | ICD-10-CM | POA: Diagnosis not present

## 2022-04-05 DIAGNOSIS — Z79899 Other long term (current) drug therapy: Secondary | ICD-10-CM | POA: Diagnosis not present

## 2022-04-05 DIAGNOSIS — M47814 Spondylosis without myelopathy or radiculopathy, thoracic region: Secondary | ICD-10-CM | POA: Diagnosis not present

## 2022-04-08 ENCOUNTER — Other Ambulatory Visit: Payer: Self-pay | Admitting: Physician Assistant

## 2022-04-08 DIAGNOSIS — R1013 Epigastric pain: Secondary | ICD-10-CM

## 2022-04-10 ENCOUNTER — Ambulatory Visit: Payer: Medicare Other | Admitting: Physician Assistant

## 2022-04-10 ENCOUNTER — Telehealth: Payer: Self-pay | Admitting: Physician Assistant

## 2022-04-10 NOTE — Telephone Encounter (Signed)
Pt called about 11:09 to reschedule her appt. She had car trouble.

## 2022-04-12 ENCOUNTER — Other Ambulatory Visit: Payer: Self-pay | Admitting: Physician Assistant

## 2022-04-12 NOTE — Telephone Encounter (Signed)
Sent for 5 patches with 1 refill last week. Please advise.

## 2022-04-17 ENCOUNTER — Ambulatory Visit: Payer: Medicare Other | Admitting: Physician Assistant

## 2022-04-19 DIAGNOSIS — M5416 Radiculopathy, lumbar region: Secondary | ICD-10-CM | POA: Diagnosis not present

## 2022-04-22 ENCOUNTER — Encounter: Payer: Self-pay | Admitting: Sports Medicine

## 2022-04-22 ENCOUNTER — Ambulatory Visit (INDEPENDENT_AMBULATORY_CARE_PROVIDER_SITE_OTHER): Payer: Medicare Other | Admitting: Sports Medicine

## 2022-04-22 DIAGNOSIS — R233 Spontaneous ecchymoses: Secondary | ICD-10-CM

## 2022-04-22 NOTE — Progress Notes (Signed)
    Procedures performed today:    None.  Independent interpretation of notes and tests performed by another provider:   None.  Brief History, Exam, Impression, and Recommendations:    Petechiae This is a pleasant 61 year old female with multiple medical problems, she has developed an erythematous rash on both dorsal forearms, they appear to be petechial, nonblanching, and there is evidence of neurodermatitis associated with each of these lesions. She denies any bleeding elsewhere such as GI bleeding or gum bleeding. She tells me she does have an engagement party coming up and needs the arms to look better, wondering if there is a cream for this. Considering persistent and regular petechial bleeding I think it is probably best for [place] to go and get a CBC, CMP, we will add some other labs including a TSH, coagulation factors. If all of the above is normal my suspicion is this is simply senile purpura as, I explained to her that this is related to thinning of the fatty layer of the skin as we age. She will protect her arms with long sleeves until the engagement party.  She was also tachycardic, I would like her to discuss this with her PCP.    ____________________________________________ Gwen Her. Dianah Field, M.D., ABFM., CAQSM., AME. Primary Care and Sports Medicine [facility]  Adjunct Professor of [employer]  Risk manager

## 2022-04-22 NOTE — Assessment & Plan Note (Signed)
This is a pleasant 61 year old female with multiple medical problems, she has developed an erythematous rash on both dorsal forearms, they appear to be petechial, nonblanching, and there is evidence of neurodermatitis associated with each of these lesions. She denies any bleeding elsewhere such as GI bleeding or gum bleeding. She tells me she does have an engagement party coming up and needs the arms to look better, wondering if there is a cream for this. Considering persistent and regular petechial bleeding I think it is probably best for [place] to go and get a CBC, CMP, we will add some other labs including a TSH, coagulation factors. If all of the above is normal my suspicion is this is simply senile purpura as, I explained to her that this is related to thinning of the fatty layer of the skin as we age. She will protect her arms with long sleeves until the engagement party.  She was also tachycardic, I would like her to discuss this with her PCP.

## 2022-04-23 LAB — CBC
HCT: 49.1 % — ABNORMAL HIGH (ref 35.0–45.0)
Hemoglobin: 17 g/dL — ABNORMAL HIGH (ref 11.7–15.5)
MCH: 32.9 pg (ref 27.0–33.0)
MCHC: 34.6 g/dL (ref 32.0–36.0)
MCV: 95.2 fL (ref 80.0–100.0)
MPV: 10.9 fL (ref 7.5–12.5)
Platelets: 272 10*3/uL (ref 140–400)
RBC: 5.16 10*6/uL — ABNORMAL HIGH (ref 3.80–5.10)
RDW: 12.2 % (ref 11.0–15.0)
WBC: 10 10*3/uL (ref 3.8–10.8)

## 2022-04-23 LAB — COMPLETE METABOLIC PANEL WITH GFR
AG Ratio: 2 (calc) (ref 1.0–2.5)
ALT: 30 U/L — ABNORMAL HIGH (ref 6–29)
AST: 16 U/L (ref 10–35)
Albumin: 4.3 g/dL (ref 3.6–5.1)
Alkaline phosphatase (APISO): 80 U/L (ref 37–153)
BUN: 15 mg/dL (ref 7–25)
CO2: 25 mmol/L (ref 20–32)
Calcium: 9.4 mg/dL (ref 8.6–10.4)
Chloride: 109 mmol/L (ref 98–110)
Creat: 0.72 mg/dL (ref 0.50–1.05)
Globulin: 2.2 g/dL (calc) (ref 1.9–3.7)
Glucose, Bld: 93 mg/dL (ref 65–99)
Potassium: 3.5 mmol/L (ref 3.5–5.3)
Sodium: 143 mmol/L (ref 135–146)
Total Bilirubin: 0.6 mg/dL (ref 0.2–1.2)
Total Protein: 6.5 g/dL (ref 6.1–8.1)
eGFR: 94 mL/min/{1.73_m2} (ref >=60)

## 2022-04-23 LAB — PROTIME-INR
INR: 1
Prothrombin Time: 10.2 s (ref 9.0–11.5)

## 2022-04-23 LAB — TSH: TSH: 0.54 mIU/L (ref 0.40–4.50)

## 2022-04-23 LAB — APTT: aPTT: 29 s (ref 23–32)

## 2022-04-29 DIAGNOSIS — Z79899 Other long term (current) drug therapy: Secondary | ICD-10-CM | POA: Diagnosis not present

## 2022-05-10 ENCOUNTER — Ambulatory Visit: Payer: Medicare Other | Admitting: Physician Assistant

## 2022-05-29 ENCOUNTER — Ambulatory Visit (INDEPENDENT_AMBULATORY_CARE_PROVIDER_SITE_OTHER): Payer: Medicare Other | Admitting: Physician Assistant

## 2022-05-29 ENCOUNTER — Encounter: Payer: Self-pay | Admitting: Physician Assistant

## 2022-05-29 VITALS — BP 127/68 | HR 92 | Wt 202.0 lb

## 2022-05-29 DIAGNOSIS — R5383 Other fatigue: Secondary | ICD-10-CM

## 2022-05-29 DIAGNOSIS — E876 Hypokalemia: Secondary | ICD-10-CM

## 2022-05-29 DIAGNOSIS — R6883 Chills (without fever): Secondary | ICD-10-CM

## 2022-05-29 DIAGNOSIS — D582 Other hemoglobinopathies: Secondary | ICD-10-CM

## 2022-05-29 NOTE — Progress Notes (Signed)
Acute Office Visit  Subjective:     Patient ID: Aimee Little, female    DOB: Oct 28, 1960, 61 y.o.   MRN: [id number]  Chief Complaint  Patient presents with   Follow-up    F/u on potassium and wants a Toradol injection and skin blotches.    HPI Patient is in today to discuss chills without fever. She has just felt exhausted for the last 2 months and having intermittent but daily chills that goes on all day. She denies starting, stopping or changing any medications. She denies any URI or infectious symptoms. Denies any urinary issues.   .. Active Ambulatory Problems    Diagnosis Date Noted   HOT FLASHES 12/29/2009   [place] DISEASE, LUMBAR 12/29/2009   Fibromyalgia 01/01/2013   Unspecified vitamin D deficiency 05/16/2013   Migraine without status migrainosus, not intractable 07/21/2013   Chronic pain syndrome 11/05/2013   Anxiety 12/17/2013   Adenomatous colon polyp 12/20/2013   Class 1 obesity due to excess calories with serious comorbidity and body mass index (BMI) of 34.0 to 34.9 in adult 11/25/2014   Sinobronchitis 04/13/2015   Chronic fatigue 05/26/2015   RLS (restless legs syndrome) 09/22/2015   Hypothyroid 10/27/2015   Insomnia 11/21/2015   Severe episode of recurrent major depressive disorder, without psychotic features ([place]) 11/29/2015   Rhinitis, allergic 11/29/2015   Vaginal atrophy 01/02/2016   Generalized anxiety disorder 03/19/2016   Panic attacks 03/19/2016   Loose body in knee 04/17/2016   Chondromalacia of patellofemoral joint 04/17/2016   Diverticulitis of colon without hemorrhage 07/19/2016   Diverticulitis 09/10/2017   Light tobacco smoker 10/01/2017   Essential hypertension 10/01/2017   DDD (degenerative disc disease), lumbar 04/01/2018   OSA on CPAP 05/29/2018   Chronic bilateral thoracic back pain 08/04/2018   Osteopenia 03/24/2019   Chronic bilateral low back pain with bilateral sciatica 12/13/2019   DDD (degenerative disc disease), thoracic  01/04/2020   Mixed hyperlipidemia 01/20/2020   Bilateral lower extremity edema 09/01/2020   Abnormal ECG 11/20/2020   Hypertension goal BP (blood pressure) < 130/80 02/28/2021   Bruised rib 06/05/2021   Aortic atherosclerosis ([place]) 06/22/2021   Coronary artery calcification seen on CT scan 06/22/2021   Emphysema lung ([place]) 06/22/2021   Pulmonary nodules 06/22/2021   Chronic constipation 09/04/2021   Chronic bilateral low back pain without sciatica 02/04/2022   Positive colorectal cancer screening using Cologuard test 03/15/2022   Anxiety and depression 03/21/2022   Hypokalemia 03/21/2022   Petechiae 04/22/2022   Elevated hemoglobin ([place]) 05/31/2022   Chills (without fever) 05/31/2022   No energy 05/31/2022   Resolved Ambulatory Problems    Diagnosis Date Noted   Unspecified hypothyroidism 12/29/2009   TOBACCO ABUSE 06/13/2010   GRIEF REACTION, ACUTE 12/29/2009   Depression 02/06/2010   Osteoarthrosis involving lower leg 12/29/2009   NAUSEA AND VOMITING 05/02/2010   Osteoarthritis of left knee 07/21/2013   Medication management 11/05/2013   Nausea with vomiting 04/07/2014   Lumbago 11/25/2014   Sacroiliac joint dysfunction of both sides 09/06/2015   Bilateral low back pain without sciatica 09/06/2015   Right knee pain 11/07/2015   Left knee pain 01/23/2016   Abdominal pain, left lower quadrant 04/25/2016   Trochanteric bursitis of left hip 01/29/2017   Nonintractable headache 03/06/2017   Tachycardia 03/06/2017   Rib pain on left side 08/03/2017   Elevated blood pressure reading 10/01/2017   Rib pain on right side 11/04/2017   Traumatic hematoma of eyelid, right, initial encounter 11/04/2017  Low grade fever 04/01/2018   Acute bilateral low back pain with bilateral sciatica 04/01/2018   Closed fracture of one rib of right side 12/01/2018   Upper back pain 11/30/2019   Mid back pain 12/13/2019   Muscle spasm of back 12/13/2019   Chest pain 09/01/2020   Epigastric  pain 09/01/2020   Past Medical History:  Diagnosis Date   Migraine    Thyroid disease    Tobacco use      ROS  See HPI.     Objective:    BP 127/68   Pulse 92   Wt 202 lb (91.6 kg)   SpO2 97%   BMI 31.64 kg/m  BP Readings from Last 3 Encounters:  05/29/22 127/68  04/22/22 134/76  03/21/22 (!) 121/54   Wt Readings from Last 3 Encounters:  05/29/22 202 lb (91.6 kg)  04/22/22 205 lb (93 kg)  03/21/22 212 lb (96.2 kg)      Physical Exam Constitutional:      Appearance: Normal appearance.  HENT:     Head: Normocephalic.  Cardiovascular:     Rate and Rhythm: Normal rate and regular rhythm.  Pulmonary:     Effort: Pulmonary effort is normal.     Breath sounds: Normal breath sounds.  Skin:    Coloration: Skin is pale.     Comments: Senile purpura  Neurological:     General: No focal deficit present.     Mental Status: She is alert.  Psychiatric:        Mood and Affect: Mood normal.           Assessment & Plan:  Marland KitchenMarland KitchenMelissa was seen today for follow-up.  Diagnoses and all orders for this visit:  Chills (without fever) -     BASIC METABOLIC PANEL WITH GFR -     CBC w/Diff/Platelet -     TSH + free T4  No energy -     BASIC METABOLIC PANEL WITH GFR -     CBC w/Diff/Platelet -     TSH + free T4  Elevated hemoglobin (HCC) -     BASIC METABOLIC PANEL WITH GFR -     CBC w/Diff/Platelet -     TSH + free T4  Hypokalemia -     BASIC METABOLIC PANEL WITH GFR   Symptoms for 2 months Unclear etiology Does not seem to be medication related Will check electrolytes, hemoglobin, kidney, liver, thyroid Follow up as needed or if symptoms worsen or change [place] Planas, PA-C

## 2022-05-30 LAB — CBC WITH DIFFERENTIAL/PLATELET
Absolute Monocytes: 670 cells/uL (ref 200–950)
Basophils Absolute: 60 cells/uL (ref 0–200)
Basophils Relative: 0.6 %
Eosinophils Absolute: 160 cells/uL (ref 15–500)
Eosinophils Relative: 1.6 %
HCT: 47.2 % — ABNORMAL HIGH (ref 35.0–45.0)
Hemoglobin: 16.5 g/dL — ABNORMAL HIGH (ref 11.7–15.5)
Lymphs Abs: 2990 cells/uL (ref 850–3900)
MCH: 32.6 pg (ref 27.0–33.0)
MCHC: 35 g/dL (ref 32.0–36.0)
MCV: 93.3 fL (ref 80.0–100.0)
MPV: 11.6 fL (ref 7.5–12.5)
Monocytes Relative: 6.7 %
Neutro Abs: 6120 cells/uL (ref 1500–7800)
Neutrophils Relative %: 61.2 %
Platelets: 283 10*3/uL (ref 140–400)
RBC: 5.06 10*6/uL (ref 3.80–5.10)
RDW: 12.4 % (ref 11.0–15.0)
Total Lymphocyte: 29.9 %
WBC: 10 10*3/uL (ref 3.8–10.8)

## 2022-05-30 LAB — BASIC METABOLIC PANEL WITH GFR
BUN: 8 mg/dL (ref 7–25)
CO2: 26 mmol/L (ref 20–32)
Calcium: 9.5 mg/dL (ref 8.6–10.4)
Chloride: 105 mmol/L (ref 98–110)
Creat: 0.72 mg/dL (ref 0.50–1.05)
Glucose, Bld: 93 mg/dL (ref 65–99)
Potassium: 3 mmol/L — ABNORMAL LOW (ref 3.5–5.3)
Sodium: 141 mmol/L (ref 135–146)
eGFR: 94 mL/min/{1.73_m2} (ref >=60)

## 2022-05-30 LAB — TSH+FREE T4: TSH W/REFLEX TO FT4: 0.95 mIU/L (ref 0.40–4.50)

## 2022-05-31 DIAGNOSIS — G8929 Other chronic pain: Secondary | ICD-10-CM | POA: Diagnosis not present

## 2022-05-31 DIAGNOSIS — M546 Pain in thoracic spine: Secondary | ICD-10-CM | POA: Diagnosis not present

## 2022-05-31 DIAGNOSIS — D582 Other hemoglobinopathies: Secondary | ICD-10-CM | POA: Insufficient documentation

## 2022-05-31 DIAGNOSIS — M47814 Spondylosis without myelopathy or radiculopathy, thoracic region: Secondary | ICD-10-CM | POA: Diagnosis not present

## 2022-05-31 DIAGNOSIS — M545 Low back pain, unspecified: Secondary | ICD-10-CM | POA: Diagnosis not present

## 2022-05-31 DIAGNOSIS — Z79899 Other long term (current) drug therapy: Secondary | ICD-10-CM | POA: Diagnosis not present

## 2022-05-31 DIAGNOSIS — R6883 Chills (without fever): Secondary | ICD-10-CM | POA: Insufficient documentation

## 2022-05-31 DIAGNOSIS — R5383 Other fatigue: Secondary | ICD-10-CM | POA: Insufficient documentation

## 2022-05-31 NOTE — Progress Notes (Signed)
Potassium is low. How much are you currently taking of potassium and are you taking daily?  Hemoglobin has improved some by decreasing.

## 2022-06-07 MED ORDER — POTASSIUM CHLORIDE CRYS ER 10 MEQ PO TBCR: 10.0000 meq | EXTENDED_RELEASE_TABLET | Freq: Two times a day (BID) | ORAL | 2 refills | Status: DC

## 2022-06-07 NOTE — Addendum Note (Signed)
Addended by: Donella Stade on: 06/07/2022 03:50 PM   Modules accepted: Orders

## 2022-06-11 ENCOUNTER — Other Ambulatory Visit: Payer: Self-pay | Admitting: Physician Assistant

## 2022-06-11 ENCOUNTER — Encounter: Payer: Self-pay | Admitting: Family Medicine

## 2022-06-11 ENCOUNTER — Ambulatory Visit (INDEPENDENT_AMBULATORY_CARE_PROVIDER_SITE_OTHER): Payer: Medicare Other

## 2022-06-11 ENCOUNTER — Ambulatory Visit (INDEPENDENT_AMBULATORY_CARE_PROVIDER_SITE_OTHER): Payer: Medicare Other | Admitting: Family Medicine

## 2022-06-11 VITALS — BP 112/63 | HR 86 | Ht 67.0 in | Wt 202.0 lb

## 2022-06-11 DIAGNOSIS — R6883 Chills (without fever): Secondary | ICD-10-CM | POA: Diagnosis not present

## 2022-06-11 DIAGNOSIS — R5383 Other fatigue: Secondary | ICD-10-CM | POA: Diagnosis not present

## 2022-06-11 DIAGNOSIS — R634 Abnormal weight loss: Secondary | ICD-10-CM

## 2022-06-11 DIAGNOSIS — J90 Pleural effusion, not elsewhere classified: Secondary | ICD-10-CM | POA: Diagnosis not present

## 2022-06-11 NOTE — Progress Notes (Signed)
Aimee Little - 61 y.o. female MRN [id number]  Date of birth: [birth year]  Subjective No chief complaint on file.   HPI Aimee Little is a 61 year old female here today with complaint of chills, fatigue, decreased appetite.  She was seen by her PCP less than 2 weeks ago.  She reports that symptoms improved last week however returned again this week.  She has not had any fevers with this.  Denies chest pain, shortness of breath, palpitations, new rash, headaches, vision changes, new or worsening joint pain.  Previous labs reviewed.  Noted to have mild hypokalemia previously.  She is a longtime smoker.  She did have lung cancer screening last year.  Denies medication changes other than slowly reducing her opioid dose over the past several months.  ROS:  A comprehensive ROS was completed and negative except as noted per HPI  Allergies  Allergen Reactions   Doxepin Other (See Comments)   Pristiq [Desvenlafaxine Succinate Er]     Depression worse.   Remeron [Mirtazapine]     Weird dreams   Estradiol Other (See Comments)    Burning    Gabapentin     Sleep walking   Levothyroxine     Other reaction(s): Other (See Comments) Reaction unknown   Lexapro [Escitalopram Oxalate]     jittery   Lisinopril     cough   Losartan     itching   Mirapex [Pramipexole Dihydrochloride]     Nausea and vomiting   Phentermine Diarrhea    Stomach ache/increased anxiety.    Pregabalin    Pristiq [Desvenlafaxine] Other (See Comments)    Increased depression   Seroquel [Quetiapine Fumarate]     hallicinations   Trintellix [Vortioxetine]     RLS/increased anxiety    Past Medical History:  Diagnosis Date   DDD (degenerative disc disease), lumbar    Depression    Fibromyalgia    Insomnia    Migraine    Thyroid disease    Tobacco use     Past Surgical History:  Procedure Laterality Date   ABDOMINAL HYSTERECTOMY     took uterus and both ovaries left    Social History   Socioeconomic History    Marital status: Divorced    Spouse name: Not on file   Number of children: Not on file   Years of education: Not on file   Highest education level: Not on file  Occupational History   Not on file  Tobacco Use   Smoking status: Light Smoker    Packs/day: 0.25    Types: Cigarettes    Last attempt to quit: 10/02/2013    Years since quitting: 8.6   Smokeless tobacco: Never  Substance and Sexual Activity   Alcohol use: No   Drug use: No   Sexual activity: Never  Other Topics Concern   Not on file  Social History Narrative   Not on file   Social Determinants of Health   Financial Resource Strain: Low Risk  (06/02/2021)   Overall Financial Resource Strain (CARDIA)    Difficulty of Paying Living Expenses: Not hard at all  Food Insecurity: No Food Insecurity (06/02/2021)   Hunger Vital Sign    Worried About Running Out of Food in the Last Year: Never true    Ran Out of Food in the Last Year: Never true  Transportation Needs: No Transportation Needs (06/02/2021)   PRAPARE - Hydrologist (Medical): No    Lack of Transportation (Non-Medical): No  Physical Activity: Inactive (06/02/2021)   Exercise Vital Sign    Days of Exercise per Week: 0 days    Minutes of Exercise per Session: 0 min  Stress: Stress Concern Present (06/02/2021)   [place]    Feeling of Stress : To some extent  Social Connections: Moderately Isolated (06/02/2021)   Social Connection and Isolation Panel [NHANES]    Frequency of Communication with Friends and Family: More than three times a week    Frequency of Social Gatherings with Friends and Family: More than three times a week    Attends Religious Services: 1 to 4 times per year    Active Member of [employer] or Organizations: No    Attends Archivist Meetings: Never    Marital Status: Divorced    Family History  Problem Relation Age of Onset   Depression Mother     Hyperlipidemia Mother    Hypertension Mother    Heart attack Father    Hypertension Sister    Diabetes Sister    Aortic aneurysm Sister    Alcohol abuse Brother    Hypertension Brother    Breast cancer Cousin    Breast cancer Cousin    Breast cancer Cousin    Breast cancer Cousin    Breast cancer Isabela Maintenance  Topic Date Due   COVID-19 Vaccine (3 - Janssen risk series) 09/23/2020   DEXA SCAN  03/23/2021   INFLUENZA VACCINE  04/30/2022   COLONOSCOPY (Pts 45-13yr Insurance coverage will need to be confirmed)  02/05/2023 (Originally 08/31/2020)   HIV Screening  02/05/2023 (Originally 12/04/1974)   TETANUS/TDAP  01/02/2023   MAMMOGRAM  07/27/2023   Hepatitis C Screening  Completed   Zoster Vaccines- Shingrix  Completed   HPV VACCINES  Aged Out     ----------------------------------------------------------------------------------------------------------------------------------------------------------------------------------------------------------------- Physical Exam BP 112/63 (BP Location: Left Arm, Patient Position: Sitting, Cuff Size: Large)   Pulse 86   Ht '5\' 7"'$  (1.702 m)   Wt 202 lb (91.6 kg)   SpO2 97%   BMI 31.64 kg/m   Physical Exam Constitutional:      Appearance: Normal appearance.  Eyes:     General: No scleral icterus. Cardiovascular:     Rate and Rhythm: Normal rate and regular rhythm.  Pulmonary:     Effort: Pulmonary effort is normal.     Breath sounds: Normal breath sounds.  Musculoskeletal:     Cervical back: Neck supple.  Neurological:     General: No focal deficit present.     Mental Status: She is alert.  Psychiatric:        Mood and Affect: Mood normal.        Behavior: Behavior normal.     ------------------------------------------------------------------------------------------------------------------------------------------------------------------------------------------------------------------- Assessment and  Plan  Chills (without fever) Continues to have chills with fatigue and decreased appetite.  She has had some unintentional weight loss which is mild.  Unclear etiology at this time.  Checking some additional labs including urinalysis and getting chest x-ray.   No orders of the defined types were placed in this encounter.   No follow-ups on file.    This visit occurred during the SARS-CoV-2 public health emergency.  Safety protocols were in place, including screening questions prior to the visit, additional usage of staff PPE, and extensive cleaning of exam room while observing appropriate contact time as indicated for disinfecting solutions.

## 2022-06-11 NOTE — Assessment & Plan Note (Signed)
Continues to have chills with fatigue and decreased appetite.  She has had some unintentional weight loss which is mild.  Unclear etiology at this time.  Checking some additional labs including urinalysis and getting chest x-ray.

## 2022-06-11 NOTE — Patient Instructions (Signed)
We'll be in touch with xray and lab results.

## 2022-06-14 ENCOUNTER — Other Ambulatory Visit: Payer: Self-pay

## 2022-06-14 DIAGNOSIS — F419 Anxiety disorder, unspecified: Secondary | ICD-10-CM

## 2022-06-14 LAB — HEPATIC FUNCTION PANEL
AG Ratio: 1.8 (calc) (ref 1.0–2.5)
ALT: 25 U/L (ref 6–29)
AST: 20 U/L (ref 10–35)
Albumin: 4.2 g/dL (ref 3.6–5.1)
Alkaline phosphatase (APISO): 61 U/L (ref 37–153)
Bilirubin, Direct: 0.1 mg/dL (ref 0.0–0.2)
Globulin: 2.3 g/dL (calc) (ref 1.9–3.7)
Indirect Bilirubin: 0.3 mg/dL (calc) (ref 0.2–1.2)
Total Bilirubin: 0.4 mg/dL (ref 0.2–1.2)
Total Protein: 6.5 g/dL (ref 6.1–8.1)

## 2022-06-14 LAB — URINALYSIS, ROUTINE W REFLEX MICROSCOPIC
Bilirubin Urine: NEGATIVE
Glucose, UA: NEGATIVE
Hgb urine dipstick: NEGATIVE
Ketones, ur: NEGATIVE
Leukocytes,Ua: NEGATIVE
Nitrite: NEGATIVE
Protein, ur: NEGATIVE
Specific Gravity, Urine: 1.018 (ref 1.001–1.035)
pH: 5.5 (ref 5.0–8.0)

## 2022-06-14 LAB — COMPLETE METABOLIC PANEL WITH GFR
AG Ratio: 1.8 (calc) (ref 1.0–2.5)
ALT: 25 U/L (ref 6–29)
AST: 20 U/L (ref 10–35)
Albumin: 4.2 g/dL (ref 3.6–5.1)
Alkaline phosphatase (APISO): 61 U/L (ref 37–153)
BUN: 11 mg/dL (ref 7–25)
CO2: 26 mmol/L (ref 20–32)
Calcium: 9.2 mg/dL (ref 8.6–10.4)
Chloride: 108 mmol/L (ref 98–110)
Creat: 0.65 mg/dL (ref 0.50–1.05)
Globulin: 2.3 g/dL (calc) (ref 1.9–3.7)
Glucose, Bld: 92 mg/dL (ref 65–99)
Potassium: 2.9 mmol/L — ABNORMAL LOW (ref 3.5–5.3)
Sodium: 143 mmol/L (ref 135–146)
Total Bilirubin: 0.4 mg/dL (ref 0.2–1.2)
Total Protein: 6.5 g/dL (ref 6.1–8.1)
eGFR: 99 mL/min/{1.73_m2} (ref >=60)

## 2022-06-14 LAB — URINE CULTURE
MICRO NUMBER:: 13911102
Result:: NO GROWTH
SPECIMEN QUALITY:: ADEQUATE

## 2022-06-14 LAB — PROTEIN ELECTROPHORESIS, SERUM
Albumin ELP: 3.9 g/dL (ref 3.8–4.8)
Alpha 1: 0.3 g/dL (ref 0.2–0.3)
Alpha 2: 0.7 g/dL (ref 0.5–0.9)
Beta 2: 0.4 g/dL (ref 0.2–0.5)
Beta Globulin: 0.4 g/dL (ref 0.4–0.6)
Gamma Globulin: 0.7 g/dL — ABNORMAL LOW (ref 0.8–1.7)
Total Protein: 6.4 g/dL (ref 6.1–8.1)

## 2022-06-14 LAB — SEDIMENTATION RATE: Sed Rate: 2 mm/h (ref 0–30)

## 2022-06-14 MED ORDER — HYDROXYZINE PAMOATE 50 MG PO CAPS: 50.0000 mg | ORAL_CAPSULE | Freq: Three times a day (TID) | ORAL | 1 refills | Status: DC | PRN

## 2022-06-15 ENCOUNTER — Other Ambulatory Visit: Payer: Self-pay | Admitting: Physician Assistant

## 2022-06-15 DIAGNOSIS — G8929 Other chronic pain: Secondary | ICD-10-CM

## 2022-06-15 DIAGNOSIS — M62830 Muscle spasm of back: Secondary | ICD-10-CM

## 2022-06-17 NOTE — Telephone Encounter (Signed)
It's for 1 tablet TID so with 180 tablets it isn't a 90 supply.

## 2022-06-17 NOTE — Telephone Encounter (Signed)
Last written in May for #180 with 1 refill. OKay to continue refills?

## 2022-06-28 ENCOUNTER — Other Ambulatory Visit: Payer: Self-pay | Admitting: Family Medicine

## 2022-06-28 DIAGNOSIS — E876 Hypokalemia: Secondary | ICD-10-CM

## 2022-07-01 ENCOUNTER — Ambulatory Visit: Payer: Medicare Other

## 2022-07-02 DIAGNOSIS — M546 Pain in thoracic spine: Secondary | ICD-10-CM | POA: Diagnosis not present

## 2022-07-02 DIAGNOSIS — Z79899 Other long term (current) drug therapy: Secondary | ICD-10-CM | POA: Diagnosis not present

## 2022-07-02 DIAGNOSIS — M47814 Spondylosis without myelopathy or radiculopathy, thoracic region: Secondary | ICD-10-CM | POA: Diagnosis not present

## 2022-07-02 DIAGNOSIS — M797 Fibromyalgia: Secondary | ICD-10-CM | POA: Diagnosis not present

## 2022-07-02 DIAGNOSIS — M545 Low back pain, unspecified: Secondary | ICD-10-CM | POA: Diagnosis not present

## 2022-07-22 ENCOUNTER — Telehealth: Payer: Self-pay

## 2022-07-22 ENCOUNTER — Telehealth: Payer: Self-pay | Admitting: Physician Assistant

## 2022-07-22 ENCOUNTER — Other Ambulatory Visit: Payer: Self-pay | Admitting: Physician Assistant

## 2022-07-22 DIAGNOSIS — F410 Panic disorder [episodic paroxysmal anxiety] without agoraphobia: Secondary | ICD-10-CM

## 2022-07-22 MED ORDER — HYDROXYZINE PAMOATE 100 MG PO CAPS: 100.0000 mg | ORAL_CAPSULE | Freq: Three times a day (TID) | ORAL | 0 refills | Status: DC | PRN

## 2022-07-22 NOTE — Telephone Encounter (Signed)
Open encounter about this already in chart.

## 2022-07-22 NOTE — Telephone Encounter (Signed)
Aimee Little left a message stating she is having terrible panic attacks and is requesting a refill on Xanax.

## 2022-07-22 NOTE — Telephone Encounter (Signed)
Patient called requesting Xanax son in hospital very ill and she is having panic attacks the pharmacy she wants it sent to [facility]

## 2022-07-22 NOTE — Telephone Encounter (Signed)
Patient states the hydroxyzine '100mg'$   taken TID is needing a Prior authorization -  she is requesting this be done asap .

## 2022-07-24 ENCOUNTER — Telehealth: Payer: Self-pay

## 2022-07-24 NOTE — Telephone Encounter (Signed)
Initiated Prior authorization BNL:[id number] Pamoate '100MG'$  capsules Via: Called [facility] [place]  Case/Key:[id number] Status: approved  as of 07/24/22 Reason:the authorization  for the requested drug  will end on 07/24/23 provided  you remain  enrolled  in the plan , th  prescribing  provider  continues  to prescribe the drug, there is no change in the prior review  requirements for the drug  continues  to be safe for treating  the members condition  Notified Pt via: [place], please note the pt may have a high co-pay for this medication  Called pt ,

## 2022-08-01 DIAGNOSIS — Z79899 Other long term (current) drug therapy: Secondary | ICD-10-CM | POA: Diagnosis not present

## 2022-08-01 DIAGNOSIS — M47814 Spondylosis without myelopathy or radiculopathy, thoracic region: Secondary | ICD-10-CM | POA: Diagnosis not present

## 2022-08-01 DIAGNOSIS — M546 Pain in thoracic spine: Secondary | ICD-10-CM | POA: Diagnosis not present

## 2022-08-01 DIAGNOSIS — M545 Low back pain, unspecified: Secondary | ICD-10-CM | POA: Diagnosis not present

## 2022-08-01 DIAGNOSIS — M1712 Unilateral primary osteoarthritis, left knee: Secondary | ICD-10-CM | POA: Diagnosis not present

## 2022-08-16 ENCOUNTER — Other Ambulatory Visit: Payer: Self-pay | Admitting: Physician Assistant

## 2022-08-16 DIAGNOSIS — I10 Essential (primary) hypertension: Secondary | ICD-10-CM

## 2022-08-24 ENCOUNTER — Other Ambulatory Visit: Payer: Self-pay | Admitting: Physician Assistant

## 2022-08-24 DIAGNOSIS — E039 Hypothyroidism, unspecified: Secondary | ICD-10-CM

## 2022-08-28 DIAGNOSIS — Z79899 Other long term (current) drug therapy: Secondary | ICD-10-CM | POA: Diagnosis not present

## 2022-08-28 DIAGNOSIS — M1712 Unilateral primary osteoarthritis, left knee: Secondary | ICD-10-CM | POA: Diagnosis not present

## 2022-08-28 DIAGNOSIS — M545 Low back pain, unspecified: Secondary | ICD-10-CM | POA: Diagnosis not present

## 2022-08-28 DIAGNOSIS — M47814 Spondylosis without myelopathy or radiculopathy, thoracic region: Secondary | ICD-10-CM | POA: Diagnosis not present

## 2022-08-28 DIAGNOSIS — M546 Pain in thoracic spine: Secondary | ICD-10-CM | POA: Diagnosis not present

## 2022-09-02 ENCOUNTER — Other Ambulatory Visit: Payer: Self-pay | Admitting: Physician Assistant

## 2022-09-03 ENCOUNTER — Ambulatory Visit: Payer: Self-pay | Admitting: Licensed Clinical Social Worker

## 2022-09-03 NOTE — Patient Outreach (Signed)
  Care Coordination   Initial Visit Note   09/03/2022 Name: Aimee Little MRN: [id number] DOB: 01-13-1961  Aimee Little is a 61 y.o. year old female who sees Aimee Little, Aimee Little, Aimee Little for primary care. I spoke with  Aimee Little by phone today.  What matters to the patients health and wellness today?    Patient scheduled phone appointment to obtain additional information about the Care Coordination program..   SDOH assessments and interventions completed:  No    Care Coordination Interventions:  No, not indicated   Follow up plan: Follow up call scheduled for 09/10/22    Encounter Outcome:  Pt. Scheduled   Casimer Lanius, [place] [phone]

## 2022-09-03 NOTE — Patient Instructions (Signed)
    It was a pleasure speaking with you today. Per your request a Care Coordination phone appointment is scheduled 09/10/22  Casimer Lanius, [place] [phone]

## 2022-09-06 ENCOUNTER — Ambulatory Visit: Payer: Medicare Other | Admitting: Family Medicine

## 2022-09-09 ENCOUNTER — Encounter: Payer: Self-pay | Admitting: Family Medicine

## 2022-09-09 ENCOUNTER — Ambulatory Visit (INDEPENDENT_AMBULATORY_CARE_PROVIDER_SITE_OTHER): Payer: Medicare Other | Admitting: Family Medicine

## 2022-09-09 ENCOUNTER — Other Ambulatory Visit: Payer: Self-pay | Admitting: Physician Assistant

## 2022-09-09 VITALS — BP 104/62 | HR 109 | Temp 98.6°F | Ht 67.0 in | Wt 201.5 lb

## 2022-09-09 DIAGNOSIS — J0111 Acute recurrent frontal sinusitis: Secondary | ICD-10-CM | POA: Diagnosis not present

## 2022-09-09 MED ORDER — AZITHROMYCIN 500 MG PO TABS: 500.0000 mg | ORAL_TABLET | Freq: Every day | ORAL | 0 refills | Status: DC

## 2022-09-09 MED ORDER — FLUCONAZOLE 150 MG PO TABS: 150.0000 mg | ORAL_TABLET | Freq: Every day | ORAL | 0 refills | Status: DC

## 2022-09-09 NOTE — Progress Notes (Signed)
Acute Office Visit  Subjective:     Patient ID: Aimee Little, female    DOB: 02/05/1961, 61 y.o.   MRN: [id number]  Chief Complaint  Patient presents with   Acute Visit    Patient originally scheduled for abdominal pain but this has now resolved. Patient is c/o possible sinus infection x 6 days.c/o  nasal congestion and headache. Taking flonase and tylenol sinus.  She states if given abx - took 10days of diflucan to treat yeast infection after  amoxicillin given. Patient also asking if  biotin will interact with levothyroxin.     HPI Patient is in today for acute visit.  Pt reports she's had nasal congestion, headaches that started last Tuesday. No cough, no sore throat, had ear ache the first day but no longer. She says it hurts across her forehead and nasal bridge. She would use nasal saline along with hot steam shower since last week. She has tried Tylenol sinus and allergy which helps. She does use flonase daily. She reports it gets worse during the day, no pets at home.  She says with antibiotic use, she gets yeast infections. She requests Diflucan.  Review of Systems  HENT:  Positive for sinus pain.   All other systems reviewed and are negative.       Objective:    BP 104/62   Pulse (!) 109   Temp 98.6 F (37 C)   Ht '5\' 7"'$  (1.702 m)   Wt 201 lb 8 oz (91.4 kg)   SpO2 97%   BMI 31.56 kg/m    Physical Exam Vitals and nursing note reviewed.  Constitutional:      Appearance: Normal appearance. She is normal weight.  HENT:     Head: Normocephalic and atraumatic.     Right Ear: Tympanic membrane, ear canal and external ear normal.     Left Ear: Tympanic membrane, ear canal and external ear normal.     Nose: Nose normal.     Comments: Maxillary sinus TTP bilaterally    Mouth/Throat:     Mouth: Mucous membranes are moist.     Pharynx: Oropharynx is clear.  Eyes:     Conjunctiva/sclera: Conjunctivae normal.     Pupils: Pupils are equal, round, and reactive to  light.  Cardiovascular:     Rate and Rhythm: Normal rate and regular rhythm.  Pulmonary:     Effort: Pulmonary effort is normal.     Breath sounds: Normal breath sounds.  Abdominal:     General: Abdomen is flat. Bowel sounds are normal.     Palpations: Abdomen is soft.  Skin:    Capillary Refill: Capillary refill takes less than 2 seconds.  Neurological:     Mental Status: She is alert.  Psychiatric:        Mood and Affect: Mood normal.        Behavior: Behavior normal.        Thought Content: Thought content normal.        Judgment: Judgment normal.    No results found for any visits on 09/09/22.      Assessment & Plan:   Problem List Items Addressed This Visit   None Visit Diagnoses     Acute recurrent frontal sinusitis    -  Primary   Relevant Medications   azithromycin (ZITHROMAX) 500 MG tablet   fluconazole (DIFLUCAN) 150 MG tablet       Meds ordered this encounter  Medications   azithromycin (ZITHROMAX) 500 MG  tablet    Sig: Take 1 tablet (500 mg total) by mouth daily.    Dispense:  3 tablet    Refill:  0   fluconazole (DIFLUCAN) 150 MG tablet    Sig: Take 1 tablet (150 mg total) by mouth daily.    Dispense:  1 tablet    Refill:  0   Azithromycin x 3 days along with Diflucan x 1 dose. May also add probiotics otc and/or yogurt while taking the antibiotics. No follow-ups on file.  Leeanne Rio, MD

## 2022-09-10 ENCOUNTER — Ambulatory Visit: Payer: Self-pay | Admitting: Licensed Clinical Social Worker

## 2022-09-10 NOTE — Patient Instructions (Signed)
Visit Information  Thank you for taking time to visit with me today. Please don't hesitate to contact me if I can be of assistance to you.   Following are the goals we discussed today:   Goals Addressed             This Visit's Progress    COMPLETED: Care Coordination Activities No Follow up Required for LCSW       Care Coordination Interventions: Reviewed Care Coordination Services:interested in RN only Assessed Social Determinants of Health Made referral to RN Care Coordinator : for Healthy Life Style( smoking, weight and chronic pain Assessed mental health needs : declined  Discussed Medicaid: will apply at [email or web address]  or [email or web address]  Solution-Focused Strategies employed:  Problem Solving [place] strategies reviewed          next appointment is by telephone on 10/07/22 at 1:30  Please call the care guide team at [phone] if you need to cancel or reschedule your appointment.   If you are experiencing a Mental Health or [place] or need someone to talk to, please call the Suicide and Crisis Lifeline: 988 call the [place] National Suicide Prevention Lifeline: 920-840-5782 or TTY: 435-388-7824 TTY (502) 881-3530) to talk to a trained counselor call 1-800-273-TALK (toll free, 24 hour hotline)   Patient verbalizes understanding of instructions and care plan provided today and agrees to view in [place]. Active MyChart status and patient understanding of how to access instructions and care plan via MyChart confirmed with patient.      Casimer Lanius, [place] [phone]

## 2022-09-10 NOTE — Patient Outreach (Signed)
  Care Coordination  Initial Visit Note   09/10/2022 Name: Aimee Little MRN: [id number] DOB: 23-Feb-1961  Aimee Little is a 61 y.o. year old female who sees Aimee Little, Aimee Little, Aimee Little for primary care. I spoke with  Aimee Little by phone today.  What matters to the patients health and wellness today?  Managing physical health needs, does not want to move forward with mental health at this time. Feels that she is making progress with mental health needs due to recent adjustment in medication.    Goals Addressed             This Visit's Progress    COMPLETED: Care Coordination Activities No Follow up Required for LCSW       Care Coordination Interventions: Reviewed Care Coordination Services:interested in RN only Assessed Social Determinants of Health Made referral to RN Care Coordinator : for Healthy Life Style( smoking, weight and chronic pain Assessed mental health needs : declined  Discussed Medicaid: will apply at [email or web address]  or [email or web address]  Solution-Focused Strategies employed:  Problem Solving /[place] strategies reviewed          SDOH assessments and interventions completed:  Yes  SDOH Interventions Today    Flowsheet Row Most Recent Value  SDOH Interventions   Food Insecurity Interventions Intervention Not Indicated  Transportation Interventions Intervention Not Indicated       Care Coordination Interventions:  Yes, provided   Follow up plan:  No further intervention required for LCSW Referral made to RN care coordinator  Encounter Outcome:  Pt. Visit Completed   Casimer Lanius, [place] [phone]

## 2022-09-13 ENCOUNTER — Encounter: Payer: Self-pay | Admitting: Physician Assistant

## 2022-09-16 ENCOUNTER — Other Ambulatory Visit: Payer: Self-pay | Admitting: Family Medicine

## 2022-09-16 DIAGNOSIS — J0111 Acute recurrent frontal sinusitis: Secondary | ICD-10-CM

## 2022-09-16 MED ORDER — FLUCONAZOLE 150 MG PO TABS: 150.0000 mg | ORAL_TABLET | Freq: Every day | ORAL | 0 refills | Status: DC

## 2022-09-16 MED ORDER — AMOXICILLIN 875 MG PO TABS: 875.0000 mg | ORAL_TABLET | Freq: Two times a day (BID) | ORAL | 0 refills | Status: AC

## 2022-09-17 ENCOUNTER — Encounter: Payer: Self-pay | Admitting: Family Medicine

## 2022-09-17 ENCOUNTER — Ambulatory Visit (INDEPENDENT_AMBULATORY_CARE_PROVIDER_SITE_OTHER): Payer: Medicare Other | Admitting: Family Medicine

## 2022-09-17 ENCOUNTER — Ambulatory Visit (INDEPENDENT_AMBULATORY_CARE_PROVIDER_SITE_OTHER): Payer: Medicare Other

## 2022-09-17 VITALS — BP 117/71 | HR 92 | Temp 97.7°F | Ht 67.0 in | Wt 200.5 lb

## 2022-09-17 DIAGNOSIS — R1032 Left lower quadrant pain: Secondary | ICD-10-CM

## 2022-09-17 MED ORDER — FAMOTIDINE 20 MG PO TABS: 20.0000 mg | ORAL_TABLET | Freq: Two times a day (BID) | ORAL | 0 refills | Status: DC

## 2022-09-17 NOTE — Progress Notes (Signed)
Acute Office Visit  Subjective:     Patient ID: Aimee Little, female    DOB: 08/07/1961, 61 y.o.   MRN: [id number]  Chief Complaint  Patient presents with   Abdominal Pain    Patient in office  c/o left sided abdomen discomfort. Does not feel normal. Feels like something "hanging in there" . She feels she has been losing weight . Passing more frequent stools with smaller amounts of stool . Seems to be passing stool directly after eating  x around 5 - 6 weeks. Cologuard was positive and going next month colonoscopy.     Abdominal Pain Associated symptoms include constipation and diarrhea. Pertinent negatives include no melena, nausea or vomiting.   Patient is in today for acute visit. Pt poor historian.  Pt reports she's had left sided flank pain. Feels heavy. Started out the blue. She says her stomach also rumbles. She has small amount of stool and loose stools. She did drink tea on the way here. She is taking her Prilosec but has been out of her Pepcid for 'a while." She denies belching. She says it's really not 'pain' but aggravating when asked her to rate the pain. She has colonoscopy scheduled next month. She had positive cologuard test.  Pt denies melena or hematochezia.  Pt has hx of diverticulosis. Denies intake of seeds, nuts, popcorn etc.   Past Medical History:  Diagnosis Date   DDD (degenerative disc disease), lumbar    Depression    Fibromyalgia    Insomnia    Migraine    Thyroid disease    Tobacco use     Review of Systems  Gastrointestinal:  Positive for abdominal pain, constipation and diarrhea. Negative for blood in stool, heartburn, melena, nausea and vomiting.  All other systems reviewed and are negative.       Objective:    BP 117/71   Pulse 92   Temp 97.7 F (36.5 C)   Ht '5\' 7"'$  (1.702 m)   Wt 200 lb 8 oz (90.9 kg)   SpO2 98%   BMI 31.40 kg/m    Physical Exam Vitals and nursing note reviewed.  Constitutional:      Appearance: She is  well-developed and normal weight.  HENT:     Head: Normocephalic and atraumatic.     Mouth/Throat:     Mouth: Mucous membranes are moist.  Eyes:     Extraocular Movements: Extraocular movements intact.     Pupils: Pupils are equal, round, and reactive to light.  Cardiovascular:     Rate and Rhythm: Normal rate and regular rhythm.     Heart sounds: Normal heart sounds.  Pulmonary:     Effort: Pulmonary effort is normal.     Breath sounds: Normal breath sounds.  Abdominal:     General: Abdomen is flat. Bowel sounds are increased. There is no distension.     Palpations: Abdomen is soft. There is no hepatomegaly or mass.     Tenderness: There is abdominal tenderness in the left upper quadrant and left lower quadrant. There is no right CVA tenderness, left CVA tenderness, guarding or rebound.  Skin:    General: Skin is warm.     Capillary Refill: Capillary refill takes less than 2 seconds.  Neurological:     General: No focal deficit present.     Mental Status: She is alert and oriented to person, place, and time.  Psychiatric:        Mood and Affect: Mood is  anxious.        Behavior: Behavior normal.    No results found for any visits on 09/17/22.      Assessment & Plan:   Problem List Items Addressed This Visit   None Visit Diagnoses     LLQ abdominal pain    -  Primary   Relevant Medications   famotidine (PEPCID) 20 MG tablet   Other Relevant Orders   DG Abd 2 Views       Meds ordered this encounter  Medications   famotidine (PEPCID) 20 MG tablet    Sig: Take 1 tablet (20 mg total) by mouth 2 (two) times daily.    Dispense:  180 tablet    Refill:  0    This prescription was filled on 04/11/2020. Any refills authorized will be placed on file.   Advised pt due to incomplete emptying of stool, add back on Linzess. She says she hasn't been taking it. Also add back Pepcid. Send for xray suspect constipation contributing to symptoms. Pt has established care with GI and  to have colonoscopy in a few weeks. No red flag symptoms reported and benign exam today. Defer CT scan at this time. No follow-ups on file.  Leeanne Rio, MD

## 2022-09-24 DIAGNOSIS — R000 Tachycardia, unspecified: Secondary | ICD-10-CM | POA: Diagnosis not present

## 2022-09-24 DIAGNOSIS — F32A Depression, unspecified: Secondary | ICD-10-CM | POA: Diagnosis not present

## 2022-10-08 ENCOUNTER — Ambulatory Visit: Payer: Self-pay

## 2022-10-08 NOTE — Patient Outreach (Signed)
  Care Coordination   Initial Visit Note   10/08/2022 Name: Aimee Little MRN: [id number] DOB: 1961-03-25  Aimee Little is a 62 y.o. year old female who sees [place], [name], [name] for primary care. I spoke with  [name] by phone today.  What matters to the patients health and wellness today?  Patient states she is "depressed". Per chart, patient presented to the ED 09/24/22 with depression, but did not stay for evaluation. She states she just did not want to wait longer. She reports she feels like her daughter in law is trying to hurt her and therefore patient states she will not have anything to do with her daughter in law. She states she is being "bullied" by her daughter in law and that she has interfered with her and her son's relationship. PHQ 2 6 and PHQ9 27. She reports she has had thoughts of hurting herself, but will not do it because she does not want to go to "hell". She states she is taking her medications for depression, but does not feel it is working at this time. She also reports she thinks she needs to see a psychiatrist. [name] is receptive to referral the [facility] LCSW to assist with mental health needs.    Goals Addressed             This Visit's Progress    Community Resource needs       Care Coordination Interventions:PHQ2 6, PHQ 9 27 Evaluation of current treatment plan related to mood and patient's adherence to plan as established by provider Reviewed medications with patient and discussed importance of taking medications as prescribed Collaborated with LCSW Casimer Lanius regarding mental health needs Social Work referral for signs/symptoms of depression. [name] is scheduled to speak with Casimer Lanius, LCSW on 10/09/22 RNCM sent message to provider to update-PHQ2 6, PHQ 9 27 Provided Help line contact information: 001 and 1-800-273-talk(8255)       SDOH assessments and interventions completed:  Yes  SDOH Interventions Today    Flowsheet Row Most  Recent Value  SDOH Interventions   Food Insecurity Interventions Other (Comment)  [reports care guide has provided resources in December and she has them]  Housing Interventions Intervention Not Indicated  Transportation Interventions Intervention Not Indicated  Utilities Interventions Intervention Not Indicated  Depression Interventions/Treatment  Medication  [reports does not go to counseling at this time.]     Care Coordination Interventions:  Yes, provided  10/22/22 Follow up plan: Follow up call scheduled for 10/22/22    Encounter Outcome:  Pt. Visit Completed   [name], RN, MSN, BSN, [place] Coordinator [phone]

## 2022-10-08 NOTE — Patient Instructions (Addendum)
Visit Information  Thank you for taking time to visit with me today. Please don't hesitate to contact me if I can be of assistance to you.   Following are the goals we discussed today:   Goals Addressed             This Visit's Progress    Community Resource needs       Care Coordination Interventions:PHQ2 6, PHQ 9 27 Evaluation of current treatment plan related to mood and patient's adherence to plan as established by provider Reviewed medications with patient and discussed importance of taking medications as prescribed Collaborated with LCSW Casimer Lanius regarding mental health needs Social Work referral for signs/symptoms of depression. Ms. Hilburn is scheduled to speak with Casimer Lanius, LCSW on 10/09/22 RNCM sent message to provider to update-PHQ2 6, PHQ 9 27 Provided Help line contact information: [phone] and 1-800-273-talk(8255)       Our next appointment is by telephone on 10/22/22 at 11:00 am  Please call the care guide team at [phone] if you need to cancel or reschedule your appointment.   If you are experiencing a Mental Health or [place] or need someone to talk to, please call the Suicide and Crisis Lifeline: 988 call 1-800-273-TALK (toll free, 24 hour hotline)  Patient verbalizes understanding of instructions and care plan provided today and agrees to view in [place]. Active MyChart status and patient understanding of how to access instructions and care plan via MyChart confirmed with patient.     [name], RN, MSN, BSN, [place] Coordinator [phone]

## 2022-10-09 ENCOUNTER — Ambulatory Visit: Payer: Self-pay | Admitting: Licensed Clinical Social Worker

## 2022-10-09 NOTE — Patient Instructions (Signed)
Visit Information  Thank you for taking time to visit with me today. Please don't hesitate to contact me if I can be of assistance to you.   Following are the goals we discussed today:   Goals Addressed             This Visit's Progress    Care Coordination Activities       Care Coordination Interventions: Motivational Interviewing employed Depression screen reviewed  Solution-Focused Strategies employed:  Mindfulness or Relaxation training provided Active listening / Reflection utilized  Engineer, petroleum Provided Problem [place] strategies reviewed Provided general psycho-education for mental health needs  Reviewed mental health medications and discussed importance of compliance: Has missed a few doses Research officer, political party / information provided  Provided EMMI education information on :Breathing to relax and Depression education  Suicidal Ideation/Homicidal Ideation assessed: Declines will act on thoughts Discussed referral for psychiatry: patient would like to connect to [facility] assisted with calling agency with patient on line Collaborated with PCP for patient treatment options  Made referral to The [place]  Patient has been approved for Medicaid  waiting for card         Our next appointment is by telephone on 10/23/22   Please call the care guide team at [phone] if you need to cancel or reschedule your appointment.   If you are experiencing a Mental Health or [place] or need someone to talk to, please call the Suicide and Crisis Lifeline: 988 call the [place] National Suicide Prevention Lifeline: 806-288-2718 or TTY: (641)879-0779 TTY 424 482 2142) to talk to a trained counselor call 1-800-273-TALK (toll free, 24 hour hotline) call 911   Patient verbalizes understanding of instructions and care plan provided today and agrees to view in [place]. Active MyChart status and patient understanding of how to access  instructions and care plan via MyChart confirmed with patient.     Casimer Lanius, [place] [phone]

## 2022-10-09 NOTE — Patient Outreach (Signed)
  Care Coordination  Follow Up Visit Note   10/09/2022 Name: Aimee Little MRN: [id number] DOB: 10-Feb-1961  Aimee Little is a 62 y.o. year old female who sees [place], [name], [name] for primary care. I spoke with  Aimee Little by phone today.  What matters to the patients health and wellness today? Patient is experiencing symptoms of depression: Difficulty getting out of bed, tearful, low energy, feeling sad and down.    Patient protective factors : [place], brother staying with her this weekend. Suicidal ideations without intent or behavioral in past month  Interventions: discussed safety plan, provided SI prevention educational, discussed and reviewed suicide risk and prevention strategies.     Goals Addressed             This Visit's Progress    Care Coordination Activities       Care Coordination Interventions: Motivational Interviewing employed Depression screen reviewed  Solution-Focused Strategies employed:  Mindfulness or Relaxation training provided Active listening / Reflection utilized  Engineer, petroleum Provided Problem [place] strategies reviewed Provided general psycho-education for mental health needs  Reviewed mental health medications and discussed importance of compliance: Has missed a few doses Research officer, political party / information provided  Provided EMMI education information on :Breathing to relax and Depression education  Suicidal Ideation/Homicidal Ideation assessed: Declines will act on thoughts Discussed referral for psychiatry: patient would like to connect to [facility] assisted with calling agency with patient on line Collaborated with PCP for patient treatment options  Made referral to [facility] [place]  Patient has been approved for [facility]  waiting for card         SDOH assessments and interventions completed:  No   Care Coordination Interventions:  Yes, provided   Follow up plan:  Referral made to The  [place] Follow up call scheduled for 10/23/22    Encounter Outcome:  Pt. Visit Completed   Casimer Lanius, [place] [phone]

## 2022-10-10 ENCOUNTER — Other Ambulatory Visit: Payer: Self-pay | Admitting: Physician Assistant

## 2022-10-10 DIAGNOSIS — F332 Major depressive disorder, recurrent severe without psychotic features: Secondary | ICD-10-CM

## 2022-10-11 ENCOUNTER — Telehealth: Payer: Self-pay

## 2022-10-11 NOTE — Patient Outreach (Signed)
  Care Coordination   Multidisciplinary Case Review Note   10/11/2022 Late Entry for 10/09/22 9:00 am Name: Aimee Little MRN: [id number] DOB: 1961/02/11  Aimee Little is a 62 y.o. year old female who sees [place], [name], [name] for primary care.  The multidisciplinary care team met today to review patient care needs and barriers. No patient contact was made during this encounter.   Goals Addressed             This Visit's Progress    COMPLETED: Care Coordination Activities       Care Coordination Interventions: Multidisciplinary Case Discussion completed         SDOH assessments and interventions completed:  No  Care Coordination Interventions Activated:  Yes   Care Coordination Interventions:  Yes, provided   Follow up plan:  LCSW to outreach    Multidisciplinary Team Attendees:  Casimer Lanius, LCSW; [name], RNCM; Reginia Naas, Nya Bardolph, RNCM; [place], RNCM, RNCM; Theadore Nan, LCSW  Scribe for Multidisciplinary Case Review:  [name], [facility]

## 2022-10-16 ENCOUNTER — Other Ambulatory Visit: Payer: Self-pay | Admitting: Physician Assistant

## 2022-10-16 ENCOUNTER — Encounter: Payer: Self-pay | Admitting: Physician Assistant

## 2022-10-16 ENCOUNTER — Ambulatory Visit (INDEPENDENT_AMBULATORY_CARE_PROVIDER_SITE_OTHER): Payer: Medicare Other | Admitting: Physician Assistant

## 2022-10-16 VITALS — BP 123/50 | HR 92 | Ht 67.0 in | Wt 210.0 lb

## 2022-10-16 DIAGNOSIS — R635 Abnormal weight gain: Secondary | ICD-10-CM | POA: Diagnosis not present

## 2022-10-16 DIAGNOSIS — F4323 Adjustment disorder with mixed anxiety and depressed mood: Secondary | ICD-10-CM

## 2022-10-16 DIAGNOSIS — F410 Panic disorder [episodic paroxysmal anxiety] without agoraphobia: Secondary | ICD-10-CM

## 2022-10-16 DIAGNOSIS — F332 Major depressive disorder, recurrent severe without psychotic features: Secondary | ICD-10-CM

## 2022-10-16 MED ORDER — CLONAZEPAM 0.5 MG PO TABS: ORAL_TABLET | ORAL | 0 refills | Status: DC

## 2022-10-16 NOTE — Progress Notes (Signed)
Established Patient Office Visit  Subjective   Patient ID: Aimee Little, female    DOB: Jul 10, 1961  Age: 62 y.o. MRN: [id number]  Chief Complaint  Patient presents with   Follow-up   Insomnia    HPI Pt is a 63 yo obese female who presents to the clinic to discuss increasing panic attacks and worsening mood. She has a lot of family stressors right now. Her son just got married and feels like his wife "has it out for her". Her daughter in law has befriended her sons step mother and now pt feels like "outsider". Her son is not talking to her right now. This weekend she was having shortness of breath episodes but they would resolve and no coughing or URI symptoms. She realized she was having panic attacks. She wants something to help with panic attacks. She tried the hydroyzine but it just makes her too groggy after taking them. She is not in counseling or seeing [place]. She is crying and sleeping a lot. No SI/HC.   Pt has gained 9lbs in the last 3 weeks. She would like her thyroid checked.   Patient Active Problem List   Diagnosis Date Noted   Adjustment disorder with mixed anxiety and depressed mood 10/18/2022   Abnormal weight gain 10/18/2022   Elevated hemoglobin (HCC) 05/31/2022   Chills (without fever) 05/31/2022   No energy 05/31/2022   Petechiae 04/22/2022   Anxiety and depression 03/21/2022   Hypokalemia 03/21/2022   Positive colorectal cancer screening using Cologuard test 03/15/2022   Chronic bilateral low back pain without sciatica 02/04/2022   Chronic constipation 09/04/2021   Aortic atherosclerosis ([place]) 06/22/2021   Coronary artery calcification seen on CT scan 06/22/2021   Emphysema lung ([place]) 06/22/2021   Pulmonary nodules 06/22/2021   Bruised rib 06/05/2021   Hypertension goal BP (blood pressure) < 130/80 02/28/2021   Abnormal ECG 11/20/2020   Bilateral lower extremity edema 09/01/2020   Mixed hyperlipidemia 01/20/2020   DDD (degenerative disc disease), thoracic  01/04/2020   Chronic bilateral low back pain with bilateral sciatica 12/13/2019   Osteopenia 03/24/2019   Chronic bilateral thoracic back pain 08/04/2018   OSA on CPAP 05/29/2018   DDD (degenerative disc disease), lumbar 04/01/2018   Light tobacco smoker 10/01/2017   Essential hypertension 10/01/2017   Diverticulitis 09/10/2017   Diverticulitis of colon without hemorrhage 07/19/2016   Loose body in knee 04/17/2016   Chondromalacia of patellofemoral joint 04/17/2016   Generalized anxiety disorder 03/19/2016   Panic attacks 03/19/2016   Vaginal atrophy 01/02/2016   Severe episode of recurrent major depressive disorder, without psychotic features ([place]) 11/29/2015   Rhinitis, allergic 11/29/2015   Insomnia 11/21/2015   Hypothyroid 10/27/2015   RLS (restless legs syndrome) 09/22/2015   Chronic fatigue 05/26/2015   Sinobronchitis 04/13/2015   Class 1 obesity due to excess calories with serious comorbidity and body mass index (BMI) of 34.0 to 34.9 in adult 11/25/2014   Adenomatous colon polyp 12/20/2013   Anxiety 12/17/2013   Chronic pain syndrome 11/05/2013   Migraine without status migrainosus, not intractable 07/21/2013   Unspecified vitamin D deficiency 05/16/2013   Fibromyalgia 01/01/2013   HOT FLASHES 12/29/2009   [place] DISEASE, LUMBAR 12/29/2009   Past Medical History:  Diagnosis Date   DDD (degenerative disc disease), lumbar    Depression    Fibromyalgia    Insomnia    Migraine    Thyroid disease    Tobacco use    Family Status  Relation Name Status  Mother  Deceased   Father  Deceased   Sister  Deceased   Sister  Alive   Brother  Building control surveyor  (Not Specified)   [place]   Family History  Problem Relation Age of Onset   Depression Mother    Hyperlipidemia Mother    Hypertension Mother    Heart attack Father    Hypertension Sister    Diabetes Sister    Aortic aneurysm Sister    Alcohol abuse Brother     Hypertension Brother    Breast cancer Cousin    Breast cancer Cousin    Breast cancer Cousin    Breast cancer Cousin    Breast cancer Cousin    Allergies  Allergen Reactions   Doxepin Other (See Comments)   Pristiq [Desvenlafaxine Succinate Er]     Depression worse.   Remeron [Mirtazapine]     Weird dreams   Estradiol Other (See Comments)    Burning    Lexapro [Escitalopram Oxalate]     jittery   Lisinopril     cough   Losartan     itching   Mirapex [Pramipexole Dihydrochloride]     Nausea and vomiting   Phentermine Diarrhea    Stomach ache/increased anxiety.    Pristiq [Desvenlafaxine] Other (See Comments)    Increased depression   Seroquel [Quetiapine Fumarate]     hallicinations   Trintellix [Vortioxetine]     RLS/increased anxiety      ROS See HPI.    Objective:     BP (!) 123/50   Pulse 92   Ht '5\' 7"'$  (1.702 m)   Wt 210 lb (95.3 kg)   SpO2 99%   BMI 32.89 kg/m  BP Readings from Last 3 Encounters:  10/16/22 (!) 123/50  09/17/22 117/71  09/09/22 104/62   Wt Readings from Last 3 Encounters:  10/16/22 210 lb (95.3 kg)  09/17/22 200 lb 8 oz (90.9 kg)  09/09/22 201 lb 8 oz (91.4 kg)    .Marland Kitchen    10/16/2022    3:09 PM 10/08/2022    1:18 PM 05/29/2022    2:20 PM 03/21/2022    1:40 PM 10/31/2021    3:04 PM  Depression screen PHQ 2/9  Decreased Interest '1 3 1 2 1  '$ Down, Depressed, Hopeless '3 3 1 2 '$ 0  PHQ - 2 Score '4 6 2 4 1  '$ Altered sleeping '3 3 2 2 1  '$ Tired, decreased energy '1 3 1 2 3  '$ Change in appetite '3 3 2 2 2  '$ Feeling bad or failure about yourself  '3 3 1 2 '$ 0  Trouble concentrating '3 3 1 2 1  '$ Moving slowly or fidgety/restless '3 3 1 1 '$ 0  Suicidal thoughts 3 3 0 1 0  PHQ-9 Score '23 27 10 16 8  '$ Difficult doing work/chores Very difficult Very difficult Somewhat difficult Very difficult Somewhat difficult   .Marland Kitchen    10/16/2022    3:09 PM 05/29/2022    2:20 PM 03/21/2022    1:40 PM 10/31/2021    3:05 PM  GAD 7 : Generalized Anxiety Score  Nervous,  Anxious, on Edge '3 1 2 2  '$ Control/stop worrying '3 2 2 1  '$ Worry too much - different things '3 2 3 1  '$ Trouble relaxing '3 1 2 3  '$ Restless '3 1 2 1  '$ Easily annoyed or irritable 1 0 1 0  Afraid - awful might happen '3 1 1 1  '$ Total GAD 7 Score '19 8 13 9  '$ Anxiety Difficulty Very difficult Somewhat difficult Very difficult Somewhat difficult      Physical Exam Constitutional:      Appearance: Normal appearance. She is obese.  HENT:     Head: Normocephalic.  Cardiovascular:     Rate and Rhythm: Normal rate and regular rhythm.  Pulmonary:     Effort: Pulmonary effort is normal.     Breath sounds: Normal breath sounds.  Neurological:     General: No focal deficit present.     Mental Status: She is alert and oriented to person, place, and time.  Psychiatric:     Comments: Very tearful and emotional         Assessment & Plan:  Marland KitchenMarland KitchenMelissa was seen today for follow-up and insomnia.  Diagnoses and all orders for this visit:  Panic attacks -     clonazePAM (KLONOPIN) 0.5 MG tablet; Take one tablet as needed for panic attacks. -     Ambulatory referral to Psychiatry -     Ambulatory referral to Psychology  Abnormal weight gain -     TSH  Severe episode of recurrent major depressive disorder, without psychotic features ([place]) -     Ambulatory referral to Psychiatry -     Ambulatory referral to Psychology  Adjustment disorder with mixed anxiety and depressed mood -     Ambulatory referral to Psychiatry -     Ambulatory referral to Psychology   PHQ/GAD numbers not to goal Continue on same medications I gave a few clonazepam for panic attacks but instructed patient NOT to take with norco due to risk of sudden death Pt reported she does not always take norco regularly and she can avoid if she has to take klonapin. Urgent counseling and psychiatry referral made today  Spent 38 minutes with patient in room discussing history, medications, and anxiety techniques.   Iran Planas,  PA-C

## 2022-10-16 NOTE — Patient Instructions (Addendum)
Will refer to counseling and psychiatry.   Managing Anxiety, Adult After being diagnosed with anxiety, you may be relieved to know why you have felt or behaved a certain way. You may also feel overwhelmed about the treatment ahead and what it will mean for your life. With care and support, you can manage this condition. How to manage lifestyle changes Managing stress and anxiety  Stress is your body's reaction to life changes and events, both good and bad. Most stress will last just a few hours, but stress can be ongoing and can lead to more than just stress. Although stress can play a major role in anxiety, it is not the same as anxiety. Stress is usually caused by something external, such as a deadline, test, or competition. Stress normally passes after the triggering event has ended.  Anxiety is caused by something internal, such as imagining a terrible outcome or worrying that something will go wrong that will devastate you. Anxiety often does not go away even after the triggering event is over, and it can become long-term (chronic) worry. It is important to understand the differences between stress and anxiety and to manage your stress effectively so that it does not lead to an anxious response. Talk with your health care provider or a counselor to learn more about reducing anxiety and stress. He or she may suggest tension reduction techniques, such as: Music therapy. Spend time creating or listening to music that you enjoy and that inspires you. Mindfulness-based meditation. Practice being aware of your normal breaths while not trying to control your breathing. It can be done while sitting or walking. Centering prayer. This involves focusing on a word, phrase, or sacred image that means something to you and brings you peace. Deep breathing. To do this, expand your stomach and inhale slowly through your nose. Hold your breath for 3-5 seconds. Then exhale slowly, letting your stomach muscles  relax. Self-talk. Learn to notice and identify thought patterns that lead to anxiety reactions and change those patterns to thoughts that feel peaceful. Muscle relaxation. Taking time to tense muscles and then relax them. Choose a tension reduction technique that fits your lifestyle and personality. These techniques take time and practice. Set aside 5-15 minutes a day to do them. Therapists can offer counseling and training in these techniques. The training to help with anxiety may be covered by some insurance plans. Other things you can do to manage stress and anxiety include: Keeping a stress diary. This can help you learn what triggers your reaction and then learn ways to manage your response. Thinking about how you react to certain situations. You may not be able to control everything, but you can control your response. Making time for activities that help you relax and not feeling guilty about spending your time in this way. Doing visual imagery. This involves imagining or creating mental pictures to help you relax. Practicing yoga. Through yoga poses, you can lower tension and promote relaxation.  Medicines Medicines can help ease symptoms. Medicines for anxiety include: Antidepressant medicines. These are usually prescribed for long-term daily control. Anti-anxiety medicines. These may be added in severe cases, especially when panic attacks occur. Medicines will be prescribed by a health care provider. When used together, medicines, psychotherapy, and tension reduction techniques may be the most effective treatment. Relationships Relationships can play a big part in helping you recover. Try to spend more time connecting with trusted friends and family members. Consider going to couples counseling if you have a  partner, taking family education classes, or going to family therapy. Therapy can help you and others better understand your condition. How to recognize changes in your  anxiety Everyone responds differently to treatment for anxiety. Recovery from anxiety happens when symptoms decrease and stop interfering with your daily activities at home or work. This may mean that you will start to: Have better concentration and focus. Worry will interfere less in your daily thinking. Sleep better. Be less irritable. Have more energy. Have improved memory. It is also important to recognize when your condition is getting worse. Contact your health care provider if your symptoms interfere with home or work and you feel like your condition is not improving. Follow these instructions at home: Activity Exercise. Adults should do the following: Exercise for at least 150 minutes each week. The exercise should increase your heart rate and make you sweat (moderate-intensity exercise). Strengthening exercises at least twice a week. Get the right amount and quality of sleep. Most adults need 7-9 hours of sleep each night. Lifestyle  Eat a healthy diet that includes plenty of vegetables, fruits, whole grains, low-fat dairy products, and lean protein. Do not eat a lot of foods that are high in fats, added sugars, or salt (sodium). Make choices that simplify your life. Do not use any products that contain nicotine or tobacco. These products include cigarettes, chewing tobacco, and vaping devices, such as e-cigarettes. If you need help quitting, ask your health care provider. Avoid caffeine, alcohol, and certain over-the-counter cold medicines. These may make you feel worse. Ask your pharmacist which medicines to avoid. General instructions Take over-the-counter and prescription medicines only as told by your health care provider. Keep all follow-up visits. This is important. Where to find support You can get help and support from these sources: Self-help groups. Online and [employer]. A trusted spiritual leader. Couples counseling. Family education classes. Family  therapy. Where to find more information You may find that joining a support group helps you deal with your anxiety. The following sources can help you locate counselors or support groups near you: [place]: www.mentalhealthamerica.net Anxiety and Depression Association of [place] (ADAA): https://www.clark.net/ National Alliance on Mental Illness (NAMI): www.nami.org Contact a health care provider if: You have a hard time staying focused or finishing daily tasks. You spend many hours a day feeling worried about everyday life. You become exhausted by worry. You start to have headaches or frequently feel tense. You develop chronic nausea or diarrhea. Get help right away if: You have a racing heart and shortness of breath. You have thoughts of hurting yourself or others. If you ever feel like you may hurt yourself or others, or have thoughts about taking your own life, get help right away. Go to your nearest emergency department or: Call your local emergency services (911 in the [place] Call a suicide crisis helpline, such as the [place] at 910-811-2371 or 988 in the [place]. This is open 24 hours a day in the [place] Text the Crisis Text Line at 3105581290 (in the [place].). Summary Taking steps to learn and use tension reduction techniques can help calm you and help prevent triggering an anxiety reaction. When used together, medicines, psychotherapy, and tension reduction techniques may be the most effective treatment. Family, friends, and partners can play a big part in supporting you. This information is not intended to replace advice given to you by your health care provider. Make sure you discuss any questions you have with your health care  provider. Document Revised: 04/11/2021 Document Reviewed: 01/07/2021 Elsevier Patient Education  [employer].

## 2022-10-17 LAB — TSH: TSH: 2.2 mIU/L (ref 0.40–4.50)

## 2022-10-18 ENCOUNTER — Encounter: Payer: Self-pay | Admitting: Physician Assistant

## 2022-10-18 DIAGNOSIS — F4323 Adjustment disorder with mixed anxiety and depressed mood: Secondary | ICD-10-CM | POA: Insufficient documentation

## 2022-10-18 DIAGNOSIS — G8929 Other chronic pain: Secondary | ICD-10-CM | POA: Diagnosis not present

## 2022-10-18 DIAGNOSIS — M546 Pain in thoracic spine: Secondary | ICD-10-CM | POA: Diagnosis not present

## 2022-10-18 DIAGNOSIS — F17210 Nicotine dependence, cigarettes, uncomplicated: Secondary | ICD-10-CM | POA: Diagnosis not present

## 2022-10-18 DIAGNOSIS — M545 Low back pain, unspecified: Secondary | ICD-10-CM | POA: Diagnosis not present

## 2022-10-18 DIAGNOSIS — Z9181 History of falling: Secondary | ICD-10-CM | POA: Diagnosis not present

## 2022-10-18 DIAGNOSIS — Z79899 Other long term (current) drug therapy: Secondary | ICD-10-CM | POA: Diagnosis not present

## 2022-10-18 DIAGNOSIS — R635 Abnormal weight gain: Secondary | ICD-10-CM | POA: Insufficient documentation

## 2022-10-18 DIAGNOSIS — Z6830 Body mass index (BMI) 30.0-30.9, adult: Secondary | ICD-10-CM | POA: Diagnosis not present

## 2022-10-18 DIAGNOSIS — M47814 Spondylosis without myelopathy or radiculopathy, thoracic region: Secondary | ICD-10-CM | POA: Diagnosis not present

## 2022-10-18 DIAGNOSIS — I10 Essential (primary) hypertension: Secondary | ICD-10-CM | POA: Diagnosis not present

## 2022-10-18 NOTE — Progress Notes (Signed)
Thyroid looks great.

## 2022-10-21 ENCOUNTER — Other Ambulatory Visit: Payer: Self-pay | Admitting: Physician Assistant

## 2022-10-21 DIAGNOSIS — J432 Centrilobular emphysema: Secondary | ICD-10-CM

## 2022-10-22 ENCOUNTER — Ambulatory Visit (INDEPENDENT_AMBULATORY_CARE_PROVIDER_SITE_OTHER): Payer: Medicare Other | Admitting: Family Medicine

## 2022-10-22 ENCOUNTER — Ambulatory Visit: Payer: Self-pay

## 2022-10-22 ENCOUNTER — Telehealth: Payer: Self-pay

## 2022-10-22 ENCOUNTER — Encounter: Payer: Self-pay | Admitting: Family Medicine

## 2022-10-22 ENCOUNTER — Ambulatory Visit: Payer: Self-pay | Admitting: Licensed Clinical Social Worker

## 2022-10-22 VITALS — BP 90/56 | HR 95 | Temp 96.8°F | Ht 67.0 in | Wt 200.7 lb

## 2022-10-22 DIAGNOSIS — J069 Acute upper respiratory infection, unspecified: Secondary | ICD-10-CM | POA: Diagnosis not present

## 2022-10-22 DIAGNOSIS — J0140 Acute pansinusitis, unspecified: Secondary | ICD-10-CM | POA: Diagnosis not present

## 2022-10-22 MED ORDER — CEPHALEXIN 250 MG PO CAPS: 250.0000 mg | ORAL_CAPSULE | Freq: Two times a day (BID) | ORAL | 0 refills | Status: DC

## 2022-10-22 MED ORDER — FLUTICASONE PROPIONATE 50 MCG/ACT NA SUSP: 2.0000 | Freq: Every day | NASAL | 0 refills | Status: DC

## 2022-10-22 NOTE — Patient Outreach (Signed)
  Care Coordination   10/22/2022 Name: Aimee Little MRN: [id number] DOB: 09/02/1961   Care Coordination Outreach Attempts:  An unsuccessful telephone outreach was attempted today to offer the patient information about available care coordination services as a benefit of their health plan.   Follow Up Plan:  Additional outreach attempts will be made to offer the patient care coordination information and services.   Encounter Outcome:  No Answer   Care Coordination Interventions:  No, not indicated    [name], RN, MSN, BSN, [place] Coordinator [phone]

## 2022-10-22 NOTE — Patient Instructions (Signed)
Visit Information  Thank you for taking time to visit with me today. Please don't hesitate to contact me if I can be of assistance to you.   Following are the goals we discussed today:   Goals Addressed             This Visit's Progress    Community Resource needs       Care Coordination Interventions:PHQ2 6, PHQ 9 27 Evaluation of current treatment plan related to mood and patient's adherence to plan as established by provider RNCM reviewed upcoming appointments with patient Reinforced instructions per PCP regarding medication clonazepam, and reinforced not to take clonazepam and norco at the same time due to risk. Patient confirmed she is aware.  RNCM provided contact number and encouraged to contact if any additional care coordination needs in the future.  RNCM Encouraged to engage with LCSW, PCP and Psychiatry, Psychology referrals as scheduled/recommended. Patient acknowledged understanding.        If you are experiencing a Mental Health or [place] or need someone to talk to, please call the Suicide and Crisis Lifeline: [place], RN, MSN, BSN, [place] [phone]

## 2022-10-22 NOTE — Progress Notes (Signed)
Acute Office Visit  Subjective:     Patient ID: Aimee Little, female    DOB: June 14, 1961, 62 y.o.   MRN: [id number]  Chief Complaint  Patient presents with   Sore Throat    Started last Thursday. She has taken ABX that were prescribed by a Dentist. She says that a throat is a little better.    Headache    She denies fever.    Generalized Body Aches    Sore Throat  Associated symptoms include congestion, coughing and headaches. Pertinent negatives include no shortness of breath.  Headache  Associated symptoms include coughing and a sore throat. Pertinent negatives include no fever or weight loss.   Patient is in today for acute visit.  Pt reports last Thursday, she started having sore throat and headaches with ringing ears. Denies fever. Sunday she found Amoxicillin tabs 6 tabs. She says she took 3 Sunday and 3 on Monday. Yesterday sore throat was a little better. Has slight cough with nasal congestion. Living in assisted living facility. No sick contacts as far as she knows. No SOB or Chest pain reported.  Review of Systems  Constitutional:  Negative for chills, fever, malaise/fatigue and weight loss.  HENT:  Positive for congestion, sinus pain and sore throat.   Respiratory:  Positive for cough. Negative for sputum production, shortness of breath and wheezing.   Cardiovascular:  Negative for chest pain.  Neurological:  Positive for headaches.  All other systems reviewed and are negative.       Objective:    BP (!) 90/56   Pulse 95   Temp (!) 96.8 F (36 C) (Temporal)   Ht '5\' 7"'$  (1.702 m)   Wt 200 lb 11.2 oz (91 kg)   SpO2 98%   BMI 31.43 kg/m    Physical Exam Vitals and nursing note reviewed.  Constitutional:      Appearance: She is well-developed and normal weight.  HENT:     Head: Normocephalic and atraumatic.     Right Ear: Tympanic membrane and ear canal normal.     Left Ear: Tympanic membrane and ear canal normal.     Mouth/Throat:     Mouth: Mucous  membranes are moist.     Pharynx: Posterior oropharyngeal erythema present. No uvula swelling.  Eyes:     Conjunctiva/sclera: Conjunctivae normal.     Pupils: Pupils are equal, round, and reactive to light.  Neck:     Thyroid: No thyromegaly.  Cardiovascular:     Rate and Rhythm: Normal rate and regular rhythm.     Heart sounds: Normal heart sounds.  Pulmonary:     Effort: Pulmonary effort is normal.     Breath sounds: Normal breath sounds.  Abdominal:     Palpations: Abdomen is soft.  Lymphadenopathy:     Cervical: No cervical adenopathy.  Skin:    General: Skin is warm.     Capillary Refill: Capillary refill takes less than 2 seconds.  Neurological:     General: No focal deficit present.     Mental Status: She is alert.  Psychiatric:        Mood and Affect: Mood normal.        Behavior: Behavior normal.    No results found for any visits on 10/22/22.      Assessment & Plan:   Problem List Items Addressed This Visit   None Visit Diagnoses     Upper respiratory tract infection, unspecified type    -  Primary   Relevant Medications   cephALEXin (KEFLEX) 250 MG capsule   Acute non-recurrent pansinusitis       Relevant Medications   fluticasone (FLONASE) 50 MCG/ACT nasal spray   cephALEXin (KEFLEX) 250 MG capsule       Meds ordered this encounter  Medications   fluticasone (FLONASE) 50 MCG/ACT nasal spray    Sig: Place 2 sprays into both nostrils daily.    Dispense:  16 g    Refill:  0   cephALEXin (KEFLEX) 250 MG capsule    Sig: Take 1 capsule (250 mg total) by mouth 2 (two) times daily.    Dispense:  14 capsule    Refill:  0   Symptoms consistent with sinusitis. Treat with Keflex and flonase.  Follow up with PCP if no better.  No follow-ups on file.  Leeanne Rio, MD

## 2022-10-22 NOTE — Patient Outreach (Signed)
  Care Coordination   Follow Up Visit Note   10/22/2022 Name: Aimee Little MRN: [id number] DOB: [birth year]  Aimee Little is a 62 y.o. year old female who sees [place], [name], [name] for primary care. I spoke with  Aimee Little by phone today.  What matters to the patients health and wellness today?  Aimee Little reports she has spoken with [facility] LCSW and completed office visit with PCP on 10/16/22. She reports she is awaiting a phone call from psychiatry and psychologist for an appointment. She has an appointment with Casimer Lanius, LCSW on tomorrow. Patient to continue to follow up with Casimer Lanius.    Goals Addressed             This Visit's Progress    Community Resource needs       Care Coordination Interventions:PHQ2 6, PHQ 9 27 Evaluation of current treatment plan related to mood and patient's adherence to plan as established by provider RNCM reviewed upcoming appointments with patient Reinforced instructions per PCP regarding medication clonazepam, and reinforced not to take clonazepam and norco at the same time due to risk. Patient confirmed she is aware.  RNCM provided contact number and encouraged to contact if any additional care coordination needs in the future.  RNCM Encouraged to engage with LCSW, PCP and Psychiatry, Psychology referrals as scheduled/recommended. Patient acknowledged understanding.        SDOH assessments and interventions completed:  No  Care Coordination Interventions:  Yes, provided   Follow up plan: No further intervention required.   Encounter Outcome:  Pt. Visit Completed   [name], RN, MSN, BSN, [place] Coordinator [phone]

## 2022-10-23 ENCOUNTER — Encounter: Payer: BLUE CROSS/BLUE SHIELD | Admitting: Licensed Clinical Social Worker

## 2022-10-24 ENCOUNTER — Ambulatory Visit: Payer: Self-pay | Admitting: Licensed Clinical Social Worker

## 2022-10-24 NOTE — Patient Instructions (Signed)
Visit Information  Thank you for taking time to visit with me today. Please don't hesitate to contact me if I can be of assistance to you.   Following are the goals we discussed today:   Goals Addressed             This Visit's Progress    Care Coordination Activities       Care Coordination Interventions: Solution-Focused Strategies employed:  Active listening / Reflection utilized  Emotional Support Provided Behavioral Activation reviewed Problem [place] strategies reviewed Discussed referral for psychiatry: made by PCP: Discussed Quit Smoke options: consultation message sent to [place] with PCP's office for patient mental health treatment options for new referral   Discussed activities for Behavioral Activation: going to church/ Valentine events Collaborated with [place] to get initial appointments scheduled based on referral from PCP  Initial appointment Jan. 30th 9:45 with therapy and Feb/ 27th 9:00 for medication management          Our next appointment is by telephone on 11/27/2022 at 11:00  Please call the care guide team at [phone] if you need to cancel or reschedule your appointment.   If you are experiencing a Mental Health or [place] or need someone to talk to, please call the Suicide and Crisis Lifeline: 988 call the [place] National Suicide Prevention Lifeline: 256-803-1179 or TTY: 236 340 9462 TTY 505-609-6847) to talk to a trained counselor call 1-800-273-TALK (toll free, 24 hour hotline) go to [facility] [address] [place] [phone])   Patient verbalizes understanding of instructions and care plan provided today and agrees to view in [place]. Active MyChart status and patient understanding of how to access instructions and care plan via MyChart confirmed with patient.     Casimer Lanius, [place] [phone]

## 2022-10-24 NOTE — Patient Outreach (Signed)
  Care Coordination  Follow Up Visit Note   10/24/2022 Name: Aimee Little MRN: [id number] DOB: [birth year]  Aimee Little is a 62 y.o. year old female who sees Aimee Little, Aimee Little, Aimee Little for primary care. I spoke with  Aimee Little by phone today.  What matters to the patients health and wellness today?  Getting mental health support.    Goals Addressed             This Visit's Progress    Care Coordination Activities       Care Coordination Interventions: Solution-Focused Strategies employed:  Active listening / Reflection utilized  Emotional Support Provided Behavioral Activation reviewed Problem [place] strategies reviewed Discussed referral for psychiatry: made by PCP: Discussed Quit Smoke options: consultation message sent to [place] with PCP's office for patient mental health treatment options for new referral   Discussed activities for Behavioral Activation: going to church/ Valentine events Collaborated with Aimee Little to get initial appointments scheduled based on referral from PCP  Initial appointment Jan. 30th 9:45 with therapy and Feb/ 27th 9:00 for medication management          SDOH assessments and interventions completed:  No    Care Coordination Interventions:  Yes, provided   Follow up plan: Follow up call scheduled for 11/07/22    Encounter Outcome:  Pt. Visit Completed   Aimee Little, [place] [phone]

## 2022-10-29 ENCOUNTER — Ambulatory Visit (INDEPENDENT_AMBULATORY_CARE_PROVIDER_SITE_OTHER): Payer: Medicare Other | Admitting: Licensed Clinical Social Worker

## 2022-10-29 DIAGNOSIS — F411 Generalized anxiety disorder: Secondary | ICD-10-CM

## 2022-10-29 DIAGNOSIS — F332 Major depressive disorder, recurrent severe without psychotic features: Secondary | ICD-10-CM

## 2022-10-29 DIAGNOSIS — F410 Panic disorder [episodic paroxysmal anxiety] without agoraphobia: Secondary | ICD-10-CM

## 2022-10-29 DIAGNOSIS — F0630 Mood disorder due to known physiological condition, unspecified: Secondary | ICD-10-CM | POA: Diagnosis not present

## 2022-10-29 DIAGNOSIS — F4323 Adjustment disorder with mixed anxiety and depressed mood: Secondary | ICD-10-CM | POA: Diagnosis not present
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
## 2022-11-01 DIAGNOSIS — M545 Low back pain, unspecified: Secondary | ICD-10-CM | POA: Diagnosis not present

## 2022-11-01 DIAGNOSIS — M47814 Spondylosis without myelopathy or radiculopathy, thoracic region: Secondary | ICD-10-CM | POA: Diagnosis not present

## 2022-11-01 DIAGNOSIS — G8929 Other chronic pain: Secondary | ICD-10-CM | POA: Diagnosis not present

## 2022-11-01 DIAGNOSIS — I10 Essential (primary) hypertension: Secondary | ICD-10-CM | POA: Diagnosis not present

## 2022-11-01 DIAGNOSIS — F17210 Nicotine dependence, cigarettes, uncomplicated: Secondary | ICD-10-CM | POA: Diagnosis not present

## 2022-11-01 DIAGNOSIS — Z6830 Body mass index (BMI) 30.0-30.9, adult: Secondary | ICD-10-CM | POA: Diagnosis not present

## 2022-11-01 DIAGNOSIS — M546 Pain in thoracic spine: Secondary | ICD-10-CM | POA: Diagnosis not present

## 2022-11-02 DIAGNOSIS — Z6830 Body mass index (BMI) 30.0-30.9, adult: Secondary | ICD-10-CM | POA: Diagnosis not present

## 2022-11-02 DIAGNOSIS — I10 Essential (primary) hypertension: Secondary | ICD-10-CM | POA: Diagnosis not present

## 2022-11-02 DIAGNOSIS — F17210 Nicotine dependence, cigarettes, uncomplicated: Secondary | ICD-10-CM | POA: Diagnosis not present

## 2022-11-02 DIAGNOSIS — M546 Pain in thoracic spine: Secondary | ICD-10-CM | POA: Diagnosis not present

## 2022-11-02 DIAGNOSIS — G8929 Other chronic pain: Secondary | ICD-10-CM | POA: Diagnosis not present

## 2022-11-02 DIAGNOSIS — M545 Low back pain, unspecified: Secondary | ICD-10-CM | POA: Diagnosis not present

## 2022-11-02 DIAGNOSIS — M47814 Spondylosis without myelopathy or radiculopathy, thoracic region: Secondary | ICD-10-CM | POA: Diagnosis not present

## 2022-11-04 ENCOUNTER — Other Ambulatory Visit: Payer: Self-pay | Admitting: Physician Assistant

## 2022-11-04 DIAGNOSIS — F32A Depression, unspecified: Secondary | ICD-10-CM

## 2022-11-07 ENCOUNTER — Ambulatory Visit: Payer: Self-pay | Admitting: Licensed Clinical Social Worker

## 2022-11-07 NOTE — Patient Instructions (Signed)
Visit Information  Thank you for taking time to visit with me today. Please don't hesitate to contact me if I can be of assistance to you.   Following are the goals we discussed today:   Goals Addressed             This Visit's Progress    COMPLETED: Connect with mental health providers       Activities in order to accomplish goals.   Cancel appointment with [facility]  Keep appointment Feb. 27th for medication management Continue therapy with Stanton Kidney at [facility] with compliance of taking medication prescribed by Doctor           Please call the care guide team at [phone] if you need to cancel or reschedule your appointment.   If you are experiencing a Mental Health or [place] or need someone to talk to, please call the Suicide and Crisis Lifeline: 988 call the [place] National Suicide Prevention Lifeline: (743)712-6558 or TTY: 562-054-9994 TTY 530 307 1102) to talk to a trained counselor call 1-800-273-TALK (toll free, 24 hour hotline) call 911   Patient verbalizes understanding of instructions and care plan provided today and agrees to view in [place]. Active MyChart status and patient understanding of how to access instructions and care plan via MyChart confirmed with patient.     No further follow up required: By [place], [place] [phone]

## 2022-11-07 NOTE — Patient Outreach (Signed)
  Care Coordination  Follow Up Visit Note   11/07/2022 Name: KAIZLEE CARLINO MRN: [id number] DOB: 1960/10/10  Shaily B Knodel is a 62 y.o. year old female who sees [place], [name], [name] for primary care. I spoke with  Rayshell B Meggett by phone today.  What matters to the patients health and wellness today?   Patient is making progress and has connected to mental health providers for therapy and medication management.  Reports initial visit with Stanton Kidney, therapist went well and she has scheduled f/u appointments  No additional ongoing needs identified at this time  Recommendation: Patient may benefit from, and is in agreement to  cancel previous scheduled appointment with [facility] all upcoming appointments with [facility]             This Visit's Progress    COMPLETED: Connect with mental health providers       Activities in order to accomplish goals.   Cancel appointment with [facility]  Keep appointment Feb. 27th for medication management Continue therapy with Stanton Kidney at [facility] with compliance of taking medication prescribed by Doctor          SDOH assessments and interventions completed:  Yes   Care Coordination Interventions:  Yes, provided  Interventions Today    Flowsheet Row Most Recent Value  Chronic Disease Discussed/Reviewed   Chronic disease discussed/reviewed during today's visit Other  [Depression]  Mental Health Interventions   Mental Health Discussed/Reviewed Mental Health Reviewed, Coping Strategies       Follow up plan: No further intervention required.   Encounter Outcome:  Pt. Visit Completed   Casimer Lanius, [place] [phone]

## 2022-11-08 ENCOUNTER — Other Ambulatory Visit: Payer: Self-pay | Admitting: Physician Assistant

## 2022-11-08 DIAGNOSIS — I10 Essential (primary) hypertension: Secondary | ICD-10-CM

## 2022-11-08 DIAGNOSIS — F332 Major depressive disorder, recurrent severe without psychotic features: Secondary | ICD-10-CM

## 2022-11-11 ENCOUNTER — Other Ambulatory Visit: Payer: Medicare Other | Admitting: Pharmacist

## 2022-11-11 NOTE — Progress Notes (Signed)
11/11/2022 Name: Aimee Little MRN: [id number] DOB: 1961/09/12   Aimee Little is a 62 y.o. year old female who presented for a telephone visit.   They were referred to the pharmacist by their Case Management Team  for assistance in managing  tobacco cessation .    Subjective:  Care Team: Primary Care Provider: Lavada Mesi ;   Medication Access/Adherence  Current Pharmacy:  [facility], [place] - [address] [place] [address] Phone: [phone] Fax: [phone]   Patient reports affordability concerns with their medications: No  Patient reports access/transportation concerns to their pharmacy: No  Patient reports adherence concerns with their medications:  No     Tobacco Abuse:  Tobacco Use History: Age when started using tobacco on a daily basis 65yrNumber of cigarettes per day, was 1ppd, now 2-3 cigarettes  Smokes first cigarette 30 minutes after waking Does not wake at night to smoke Triggers include psychological: after meals, car  Quit Attempt History: Most recent quit attempt 89yrld, during pregnancy of her son Longest time ever been tobacco free: through  Methods tried in the past include Nicotine Patch.   Motivators to quitting include her health & wanting to avoid oxygen therapy; barriers include  habit-based    Current medication access support: utilized the 1-800 quit line. Has 2181mnd 105m52mtches. On them through July-October. Started smoking again in December in response to stress with daughter in law, as well as back pain.   Has tried lozenges in past - 2mg.45me 4mg w63m way too strong.   Objective:  Lab Results  Component Value Date   HGBA1C 4.6 01/22/2021    Lab Results  Component Value Date   CREATININE 0.65 06/11/2022   BUN 11 06/11/2022   NA 143 06/11/2022   K 2.9 (L) 06/11/2022   CL 108 06/11/2022   CO2 26 06/11/2022    Lab Results  Component  Value Date   CHOL 125 02/05/2022   HDL 60 02/05/2022   LDLCALC 51 02/05/2022   TRIG 48 02/05/2022   CHOLHDL 2.1 02/05/2022    Medications Reviewed Today     Reviewed by Kaylean Tupou,Darius Bump(PH[facility]ist) on 11/11/22 at 1417  Med List Status: <None>   Medication Order Taking? Sig Documenting Provider Last Dose Status Informant  AMBULATORY NON FORMULARY MEDICATION [id number] unit for lumbar spine for low back pain, muscle spasm, lumbar DDD. BreebaDonella Stade Taking Active   amLODipine (NORVASC) 10 MG tablet [id number] 1 tablet (10 mg total) by mouth daily. BreebaDonella Stade Taking Active   atorvastatin (LIPITOR) 40 MG tablet [id number] 1 tablet (40 mg total) by mouth daily. BreebaDonella Stade Taking Active   busPIRone (BUSPAR) 15 MG tablet [id number] 1 tablet (15 mg total) by mouth 3 (three) times daily. BreebaLavada Mesig Active   CALCIUM-MAGNESIUM-ZINC PO [id number] by mouth daily. [provider] Taking Active   cholecalciferol (VITAMIN D3) 25 MCG (1000 UT) tablet [id number] 1,000 Units by mouth daily. [provider] Taking Active   clonazePAM (KLONOPIN) 0.5 MG tablet [id number] one tablet as needed for panic attacks. BreebaDonella Stade Taking Active   cyclobenzaprine (FLEXERIL) 10 MG tablet [id number] 1 tablet (10 mg total) by mouth 3 (three) times daily as needed for muscle spasms. BreebaDonella Stade  PA-C Taking Active   diclofenac sodium (VOLTAREN) 1 % GEL [id number] Yes Apply 4 g topically 4 (four) times daily. To affected joint.  Patient taking differently: Apply 4 g topically 4 (four) times daily as needed. To affected joint.   Gregor Hams, MD Taking Active   famotidine (PEPCID) 20 MG tablet [id number] Yes Take 1 tablet (20 mg total) by mouth 2 (two) times daily. Leeanne Rio, MD Taking Active   fluticasone [facility]) 50 MCG/ACT nasal spray [id number] Yes Place 2 sprays  into both nostrils daily. Leeanne Rio, MD Taking Active   gabapentin (NEURONTIN) 300 MG capsule [id number] Yes TAKE 1 CAPSULE BY MOUTH 3 TIMES A DAY AS NEEDED FOR Ysidro Evert, MD Taking Active            Med Note Rikki Spearing Nov 11, 2022  2:12 PM) Managed by pain doctor  hydrochlorothiazide (HYDRODIURIL) 50 MG tablet [id number] Yes Take 1 tablet (50 mg total) by mouth daily.  Patient taking differently: Take 25 mg by mouth daily.   Donella Stade, PA-C Taking Active            Med Note Rikki Spearing Nov 11, 2022  2:13 PM) Taking half tablets as of 11/11/22 review  HYDROcodone-acetaminophen (NORCO) 7.5-325 MG tablet [id number] Yes Take 1 tablet by mouth every 8 (eight) hours as needed. [provider] Taking Active   hydrOXYzine (VISTARIL) 100 MG capsule [id number] Yes Take 1 capsule (100 mg total) by mouth 3 (three) times daily as needed for anxiety. Breeback, [name] L, PA-C Taking Active   INCRUSE ELLIPTA 62.5 MCG/ACT AEPB [id number] Yes Inhale 1 puff into the lungs daily. Donella Stade, PA-C Taking Active   lamoTRIgine (LAMICTAL) 100 MG tablet [id number] No Take 1 tablet (100 mg total) by mouth daily.  Patient not taking: Reported on 11/11/2022   Donella Stade, PA-C Not Taking Active   levothyroxine (SYNTHROID) 150 MCG tablet [id number]  Take 1 tab (119mg) by mouth Mon, Fri. Take 1.5 tabs (2252m) Sun, Tues, Wed, Thurs & Sat. Take on empty stomach at least 30 minutes prior to food or other medications. BrLavada MesiActive   LINZESS 72 MCG capsule [id number] Take 72 mcg by mouth every morning.  Patient not taking: Reported on 11/11/2022   [provider] Not Taking Active   metoCLOPramide (REGLAN) 10 MG tablet [id number] TAKE ONE TABLET BY MOUTH FOUR TIMES DAILY - BEFORE meals AND AT BEDTIME  Patient not taking: Reported on 11/11/2022   BrDonella StadePA-C Not Taking Active   Omega 3-6-9 Fatty Acids (OMEGA 3-6-9  COMPLEX PO) [id number] Take 1 capsule by mouth daily.  [provider] Taking Active   omeprazole (PRILOSEC) 40 MG capsule [id number] Take 1 capsule (40 mg total) by mouth in the morning and at bedtime. Breeback, [name] L, PA-C Taking Active   PAIN RELIEF MAXIMUM STRENGTH 4 % [id number] Place 1 patch onto the skin daily.  Patient not taking: Reported on 11/11/2022   BrDonella StadePA-C Not Taking Active   rizatriptan (MAXALT-MLT) 10 MG disintegrating tablet [id number] May repeat in 2 hours if needed for migraine Breeback, [name] L, PA-C Taking Active   rOPINIRole (REQUIP) 1 MG tablet [id number] Take 1 tablet (1 mg total) by mouth 3 (three) times daily. BrDonella StadePA-C Taking Active   topiramate (TOPAMAX) 100 MG tablet [id number] Take  one tablet twice a day. Donella Stade, PA-C Taking Active   traZODone (DESYREL) 50 MG tablet [id number] Yes Take 3 tablets (150 mg total) by mouth at bedtime as needed for sleep. Donella Stade, PA-C Taking Active   valACYclovir (VALTREX) 1000 MG tablet [id number] Yes TAKE TWO TABLETS BY MOUTH 2 TIMES A DAY AT ONSET OF COLD SORE FOR 2 DAYS Breeback, [name] L, PA-C Taking Active   Vilazodone HCl (VIIBRYD) 40 MG TABS [id number] Yes Take 1 tablet (40 mg total) by mouth daily. Donella Stade, PA-C Taking Active               Assessment/Plan:   Tobacco Abuse - Currently uncontrolled, but with progress and future plans in place - Provided motivational interviewing to assess tobacco use and strategies for reduction - Provided information on 1 800 QUIT NOW support program - Patient is already utilizing patch via quit line, provided counseling on available options and efficacy for dual nicotine replacement therapy. She is amenable to using lozenges. - Recommend patient follow-up with quit line, who stated the lozenges were on backorder at the time. Will ask again and try to start lozenge + gum to assist in reducing the 2-3  cigarettes/day.  Follow Up Plan: 2-3 weeks   Larinda Buttery, PharmD Clinical Pharmacist [facility] [phone]

## 2022-11-11 NOTE — Patient Instructions (Signed)
Aimee Little,  It was great chatting with you today! Don't forget to reach back out to the quit line, and ask about lozenges.   Keep up the great work, and I will touch base with you in a few weeks.  Take care, Aimee Little, PharmD Clinical Pharmacist [facility] [phone]

## 2022-11-12 DIAGNOSIS — M5416 Radiculopathy, lumbar region: Secondary | ICD-10-CM | POA: Diagnosis not present

## 2022-11-15 ENCOUNTER — Other Ambulatory Visit: Payer: Self-pay | Admitting: Physician Assistant

## 2022-11-15 DIAGNOSIS — F332 Major depressive disorder, recurrent severe without psychotic features: Secondary | ICD-10-CM

## 2022-11-19 ENCOUNTER — Other Ambulatory Visit: Payer: Self-pay | Admitting: Physician Assistant

## 2022-11-19 ENCOUNTER — Encounter: Payer: Self-pay | Admitting: Physician Assistant

## 2022-11-19 DIAGNOSIS — F410 Panic disorder [episodic paroxysmal anxiety] without agoraphobia: Secondary | ICD-10-CM

## 2022-11-19 DIAGNOSIS — F32A Depression, unspecified: Secondary | ICD-10-CM

## 2022-11-19 DIAGNOSIS — F419 Anxiety disorder, unspecified: Secondary | ICD-10-CM

## 2022-11-19 MED ORDER — CLONAZEPAM 0.5 MG PO TABS: ORAL_TABLET | ORAL | 0 refills | Status: DC

## 2022-11-19 MED ORDER — LAMOTRIGINE 100 MG PO TABS: 100.0000 mg | ORAL_TABLET | Freq: Every day | ORAL | 0 refills | Status: DC

## 2022-11-19 NOTE — Telephone Encounter (Signed)
Message from patient in [place] as below.  "I'm having extreme panic attacks. I'm supposed to see psychiatrist the end of the month. I have requested my Lamictal and clonopin for refill. I would come in but am unable to do anything until I can get the panic attacks under control. "

## 2022-11-19 NOTE — Telephone Encounter (Signed)
Last OV: 10/16/22 Next OV: none scheduled Last RF: 10/16/22

## 2022-11-19 NOTE — Telephone Encounter (Signed)
Sent to pharmacy 

## 2022-11-19 NOTE — Telephone Encounter (Signed)
Sent refill to pharmacy. 

## 2022-11-19 NOTE — Telephone Encounter (Signed)
Is refill appropriate for the current rx? Please advise, thanks.

## 2022-11-19 NOTE — Telephone Encounter (Signed)
Patient informed. 

## 2022-11-19 NOTE — Telephone Encounter (Signed)
..  PDMP reviewed during this encounter. Refilled klonapin.  Appt with BH later this month.

## 2022-11-21 ENCOUNTER — Encounter (HOSPITAL_COMMUNITY): Payer: Self-pay

## 2022-11-21 ENCOUNTER — Ambulatory Visit (INDEPENDENT_AMBULATORY_CARE_PROVIDER_SITE_OTHER): Payer: Medicare Other | Admitting: Licensed Clinical Social Worker

## 2022-11-21 DIAGNOSIS — F410 Panic disorder [episodic paroxysmal anxiety] without agoraphobia: Secondary | ICD-10-CM

## 2022-11-21 DIAGNOSIS — F4323 Adjustment disorder with mixed anxiety and depressed mood: Secondary | ICD-10-CM

## 2022-11-21 DIAGNOSIS — F332 Major depressive disorder, recurrent severe without psychotic features: Secondary | ICD-10-CM | POA: Diagnosis not present

## 2022-11-21 DIAGNOSIS — F411 Generalized anxiety disorder: Secondary | ICD-10-CM | POA: Diagnosis not present

## 2022-11-21 DIAGNOSIS — F0630 Mood disorder due to known physiological condition, unspecified: Secondary | ICD-10-CM

## 2022-11-21 NOTE — Progress Notes (Signed)
Virtual Visit via Video Note  I connected with Aimee Little on 11/21/22 at  8:00 AM EST by a video enabled telemedicine application and verified that I am speaking with the correct person using two identifiers.  Location: Patient: home Provider: home office   I discussed the limitations of evaluation and management by telemedicine and the availability of in person appointments. The patient expressed understanding and agreed to proceed.   I discussed the assessment and treatment plan with the patient. The patient was provided an opportunity to ask questions and all were answered. The patient agreed with the plan and demonstrated an understanding of the instructions.   The patient was advised to call back or seek an in-person evaluation if the symptoms worsen or if the condition fails to improve as anticipated.  I provided 53 minutes of non-face-to-face time during this encounter.  THERAPIST PROGRESS NOTE  Session Time: 8:00 AM to 8:53 AM  Participation Level: Active  Behavioral Response: CasualAlertAnxious  Type of Therapy: Individual Therapy  Treatment Goals addressed: Anxiety, panic, depression, coping  ProgressTowards Goals: Initial  Interventions: CBT, Solution Focused, Strength-based, Supportive, and Other: Coping  Summary: Aimee Little is a 62 y.o. female who presents with having really bad panic attacks, called doctor who treated her before she gave her Klonopin, Buspar and Hydroxyzine. Got out of the house first time yesterday felt couldn't stop the panic, couldn't breath, chest pounding. Don't know why happening. .Doing ok with son talked and better about listening. He is home and he seems to be doing ok. Call him once or twice a week. Told him want him to come from birthday he said try see what he can do. Asked for wife to call she never called. She is about her family doesn't care about patient. Patient said she hates patient he says no patient says she can feel it.   Never had panic attacks like this where they are lasting all day.  Utilize session to educate patient about panic.     Therapist reviewed treatment plan patient gave consent to complete virtually patient's panic is severe right now so we worked on psychoeducation for anxiety and panic as this will help her develop coping strategies.  Looked at website CBT [place] and discussed how both are thoughts and actions feed anxiety and panic so that is where we can begin to develop strategies.  Challenge are negative thinking distorted thinking.  People are anxious have catastrophic thinking and think something bad is can happen.  Therapist noted with panic it is fighter flight response we are preparing her self or danger but it is a false alarm we really do not need it.  Patient said she does not have any thoughts but therapist was able to challenge her she expressed the thought I am going to die and said this is the thought needs to be challenged replaced with the positive coping statement this is panic it will pass it is harmless.  Also can work with behaviors less avoidance helps anxiety.  Therapist also noted anxiety plan includes regular relaxation so body is less activated and could look like deep breathing twice a day do when she is not panicked so body is less triggered, also couple other relaxation exercises a week added in can find alternatives such as taking a shower bath taking a walk for relaxation.  Reviewed website "What is panic disorder 5 ways to train your brain to stop panic attacks before they start" reviewing concepts of panic that  it is a Musician but miss activating patient to read for homework also to look at video by Erby Pian " how I learn to manage panic attacks and high anxiety" patient said at end of session she felt better therapist noted she was using another coping skill expressing feelings help cope when we lowered self to feel feelings it helps in not showing up in unwanted  ways  Suicidal/Homicidal: No  Plan: Return again in 1 week.2.  Continue to work on panic look at thoughts and emotions book chapter and panic CBT video on panic  Diagnosis: Major depressive disorder, recurrent, severe, generalized anxiety disorder with panic attacks, adjustment disorder with anxiety depression, mood disorder in conditions classified elsewhere  Collaboration of Care: Other none needed  Patient/Guardian was advised Release of Information must be obtained prior to any record release in order to collaborate their care with an outside provider. Patient/Guardian was advised if they have not already done so to contact the registration department to sign all necessary forms in order for [place] to release information regarding their care.   Consent: Patient/Guardian gives verbal consent for treatment and assignment of benefits for services provided during this visit. Patient/Guardian expressed understanding and agreed to proceed.   Cordella Register, [place] 11/21/2022

## 2022-11-25 ENCOUNTER — Encounter (HOSPITAL_COMMUNITY): Payer: Self-pay

## 2022-11-25 ENCOUNTER — Other Ambulatory Visit: Payer: Self-pay | Admitting: Pharmacist

## 2022-11-25 ENCOUNTER — Ambulatory Visit (INDEPENDENT_AMBULATORY_CARE_PROVIDER_SITE_OTHER): Payer: Medicare Other | Admitting: Licensed Clinical Social Worker

## 2022-11-25 ENCOUNTER — Other Ambulatory Visit: Payer: Medicare Other | Admitting: Pharmacist

## 2022-11-25 DIAGNOSIS — F332 Major depressive disorder, recurrent severe without psychotic features: Secondary | ICD-10-CM

## 2022-11-25 DIAGNOSIS — F410 Panic disorder [episodic paroxysmal anxiety] without agoraphobia: Secondary | ICD-10-CM

## 2022-11-25 DIAGNOSIS — F411 Generalized anxiety disorder: Secondary | ICD-10-CM

## 2022-11-25 DIAGNOSIS — F4323 Adjustment disorder with mixed anxiety and depressed mood: Secondary | ICD-10-CM

## 2022-11-25 DIAGNOSIS — F0630 Mood disorder due to known physiological condition, unspecified: Secondary | ICD-10-CM

## 2022-11-25 NOTE — Progress Notes (Signed)
Pharmacy Documentation   Unsuccessful attempt x2 to contact patient for follow-up phone call for medication access services with pharmacist. Left patient voicemail to return call at convenience.  Will route to schedule team for reschedule.  Larinda Buttery, PharmD Clinical Pharmacist [facility] [phone]

## 2022-11-25 NOTE — Progress Notes (Signed)
Patient did not show for appointment.   

## 2022-11-26 ENCOUNTER — Ambulatory Visit (HOSPITAL_COMMUNITY): Payer: Medicare Other | Admitting: Psychiatry

## 2022-11-26 ENCOUNTER — Encounter (HOSPITAL_COMMUNITY): Payer: Self-pay | Admitting: Psychiatry

## 2022-11-26 VITALS — BP 128/84 | HR 99 | Temp 98.4°F | Ht 68.5 in | Wt 200.0 lb

## 2022-11-26 DIAGNOSIS — F0630 Mood disorder due to known physiological condition, unspecified: Secondary | ICD-10-CM

## 2022-11-26 DIAGNOSIS — F411 Generalized anxiety disorder: Secondary | ICD-10-CM | POA: Diagnosis not present

## 2022-11-26 DIAGNOSIS — F410 Panic disorder [episodic paroxysmal anxiety] without agoraphobia: Secondary | ICD-10-CM

## 2022-11-26 DIAGNOSIS — F332 Major depressive disorder, recurrent severe without psychotic features: Secondary | ICD-10-CM | POA: Diagnosis not present

## 2022-11-26 DIAGNOSIS — F4323 Adjustment disorder with mixed anxiety and depressed mood: Secondary | ICD-10-CM | POA: Diagnosis not present

## 2022-11-26 DIAGNOSIS — F5102 Adjustment insomnia: Secondary | ICD-10-CM

## 2022-11-26 NOTE — Progress Notes (Signed)
Psychiatric Initial Adult Assessment   Patient Identification: Aimee Little MRN:  [id number] Date of Evaluation:  11/26/2022 Referral Source: Luvenia Starch Chief Complaint:   Chief Complaint  Patient presents with   Anxiety   Depression   Establish Care   Visit Diagnosis:    ICD-10-CM   1. Severe episode of recurrent major depressive disorder, without psychotic features ([place])  F33.2     2. Generalized anxiety disorder with panic attacks  F41.1    F41.0     3. Adjustment disorder with mixed anxiety and depressed mood  F43.23     4. Mood disorder in conditions classified elsewhere  F06.30       History of Present Illness: Patient is a 62 years old currently divorced function female living by self she is on disability because of fibromyalgia and degenerative joint disease she is referred by primary care physician to establish care for depression and anxiety  She is currently in therapy as well she has a long history of having episodes of depression one-time hospital admission and remote past she has been having anxiety excessive worries but in general as of now she endorses panic-like symptoms she wakes up in the morning she has irregular sleep she is not using her CPAP machine.  She feels she is always tense and very full no reason.  In regard to depression she feels her depression is manageable but she has irregular sleep she has panic attacks during the day she seldom takes Klonopin but she is on multiple medication BuSpar 3 times a day hydroxyzine 2 or 3 times a day gabapentin, Lamictal, Viibryd she has been on different SSRIs but lately Viibryd is helped the depression she feels more motivated  There is no associated psychotic symptoms there is no associated manic symptoms  She feels tired fatigue has been diagnosed with fibromyalgia but Viibryd being has helped She goes to bed around 10:00 can fall asleep for 2 hours she takes the trazodone at night at 9 PM  We talked about taking  the medication at 11 and going to bed at 11 she is also taking gabapentin it regularly but it does help when she takes it he is also on Lamictal with no rash  Aggravating factors; difficult marriage.  Medical comorbidities  Modifying factors; friends, family, sister, son  Duration adult life Severity mostly anxiety and irregular sleep  No drug use   Past Psychiatric History: depression, anxiety, panic attacks   Previous Psychotropic Medications: Yes  Many different meds  Substance Abuse History in the last 12 months:  No.  Consequences of Substance Abuse: NA  Past Medical History:  Past Medical History:  Diagnosis Date   DDD (degenerative disc disease), lumbar    Depression    Fibromyalgia    Insomnia    Migraine    Thyroid disease    Tobacco use     Past Surgical History:  Procedure Laterality Date   ABDOMINAL HYSTERECTOMY     took uterus and both ovaries left    Family Psychiatric History: not know  Family History:  Family History  Problem Relation Age of Onset   Depression Mother    Hyperlipidemia Mother    Hypertension Mother    Heart attack Father    Hypertension Sister    Diabetes Sister    Aortic aneurysm Sister    Alcohol abuse Brother    Hypertension Brother    Breast cancer Cousin    Breast cancer Cousin    Breast cancer  Cousin    Breast cancer Cousin    Breast cancer Cousin     Social History:   Social History   Socioeconomic History   Marital status: Divorced    Spouse name: Not on file   Number of children: Not on file   Years of education: Not on file   Highest education level: Not on file  Occupational History   Not on file  Tobacco Use   Smoking status: Light Smoker    Packs/day: 0.25    Types: Cigarettes    Last attempt to quit: 10/02/2013    Years since quitting: 9.1   Smokeless tobacco: Never   Tobacco comments:    Using patches to help stop.   Substance and Sexual Activity   Alcohol use: Yes    Comment: Occasional  drink - 1 beer a month   Drug use: No   Sexual activity: Not Currently  Other Topics Concern   Not on file  Social History Narrative   Not on file   Social Determinants of Health   Financial Resource Strain: Low Risk  (06/02/2021)   Overall Financial Resource Strain (CARDIA)    Difficulty of Paying Living Expenses: Not hard at all  Food Insecurity: Food Insecurity Present (10/08/2022)   Hunger Vital Sign    Worried About Running Out of Food in the Last Year: Sometimes true    Ran Out of Food in the Last Year: Sometimes true  Transportation Needs: No Transportation Needs (10/08/2022)   PRAPARE - Hydrologist (Medical): No    Lack of Transportation (Non-Medical): No  Physical Activity: Inactive (06/02/2021)   Exercise Vital Sign    Days of Exercise per Week: 0 days    Minutes of Exercise per Session: 0 min  Stress: Stress Concern Present (06/02/2021)   [place]    Feeling of Stress : To some extent  Social Connections: Moderately Isolated (06/02/2021)   Social Connection and Isolation Panel [NHANES]    Frequency of Communication with Friends and Family: More than three times a week    Frequency of Social Gatherings with Friends and Family: More than three times a week    Attends Religious Services: 1 to 4 times per year    Active Member of [employer] or Organizations: No    Attends Music therapist: Never    Marital Status: Divorced    Additional Social History: grew up with parents, was good, no abuse, married once , he cheated all the time  [place] on disability  Allergies:   Allergies  Allergen Reactions   Doxepin Other (See Comments)   Pristiq [Desvenlafaxine Succinate Er]     Depression worse.   Remeron [Mirtazapine]     Weird dreams   Estradiol Other (See Comments)    Burning    Lexapro [Escitalopram Oxalate]     jittery   Lisinopril     cough   Losartan      itching   Mirapex [Pramipexole Dihydrochloride]     Nausea and vomiting   Phentermine Diarrhea    Stomach ache/increased anxiety.    Pristiq [Desvenlafaxine] Other (See Comments)    Increased depression   Seroquel [Quetiapine Fumarate]     hallicinations   Trintellix [Vortioxetine]     RLS/increased anxiety    Metabolic Disorder Labs: Lab Results  Component Value Date   HGBA1C 4.6 01/22/2021   No results found for: "PROLACTIN" Lab Results  Component Value Date   CHOL 125 02/05/2022   TRIG 48 02/05/2022   HDL 60 02/05/2022   CHOLHDL 2.1 02/05/2022   VLDL 18 12/06/2016   LDLCALC 51 02/05/2022   LDLCALC 102 01/22/2021   Lab Results  Component Value Date   TSH 2.20 10/16/2022    Therapeutic Level Labs: No results found for: "LITHIUM" No results found for: "CBMZ" No results found for: "VALPROATE"  Current Medications: Current Outpatient Medications  Medication Sig Dispense Refill   AMBULATORY NON FORMULARY MEDICATION Tens unit for lumbar spine for low back pain, muscle spasm, lumbar DDD. 1 Device 0   amLODipine (NORVASC) 10 MG tablet Take 1 tablet (10 mg total) by mouth daily. 90 tablet 0   atorvastatin (LIPITOR) 40 MG tablet Take 1 tablet (40 mg total) by mouth daily. 90 tablet 0   busPIRone (BUSPAR) 15 MG tablet Take 1 tablet (15 mg total) by mouth 3 (three) times daily. 180 tablet 3   CALCIUM-MAGNESIUM-ZINC PO Take by mouth daily.     cholecalciferol (VITAMIN D3) 25 MCG (1000 UT) tablet Take 1,000 Units by mouth daily.     clonazePAM (KLONOPIN) 0.5 MG tablet Take one tablet as needed for panic attacks. 20 tablet 0   cyclobenzaprine (FLEXERIL) 10 MG tablet Take 1 tablet (10 mg total) by mouth 3 (three) times daily as needed for muscle spasms. 180 tablet 2   hydrochlorothiazide (HYDRODIURIL) 50 MG tablet Take 1 tablet (50 mg total) by mouth daily. (Patient taking differently: Take 25 mg by mouth daily.) 90 tablet 1   HYDROcodone-acetaminophen (NORCO) 7.5-325 MG tablet  Take 1 tablet by mouth every 8 (eight) hours as needed.     INCRUSE ELLIPTA 62.5 MCG/ACT AEPB Inhale 1 puff into the lungs daily. 90 each 3   lamoTRIgine (LAMICTAL) 100 MG tablet Take 1 tablet (100 mg total) by mouth daily. 90 tablet 0   levothyroxine (SYNTHROID) 150 MCG tablet Take 1 tab (155mg) by mouth Mon, Fri. Take 1.5 tabs (2237m) Sun, Tues, Wed, Thurs & Sat. Take on empty stomach at least 30 minutes prior to food or other medications. 114 tablet 1   metoCLOPramide (REGLAN) 10 MG tablet TAKE ONE TABLET BY MOUTH FOUR TIMES DAILY - BEFORE meals AND AT BEDTIME 120 tablet 3   nabumetone (RELAFEN) 500 MG tablet Take 500 mg by mouth 2 (two) times daily as needed for moderate pain.     Omega 3-6-9 Fatty Acids (OMEGA 3-6-9 COMPLEX PO) Take 1 capsule by mouth daily.      omeprazole (PRILOSEC) 40 MG capsule Take 1 capsule (40 mg total) by mouth in the morning and at bedtime. 180 capsule 3   rizatriptan (MAXALT-MLT) 10 MG disintegrating tablet May repeat in 2 hours if needed for migraine 30 tablet 1   rOPINIRole (REQUIP) 1 MG tablet Take 1 tablet (1 mg total) by mouth 3 (three) times daily. 270 tablet 1   traZODone (DESYREL) 50 MG tablet Take 3 tablets (150 mg total) by mouth at bedtime as needed for sleep. 90 tablet 5   Vilazodone HCl (VIIBRYD) 40 MG TABS Take 1 tablet (40 mg total) by mouth daily. 90 tablet 0   diclofenac sodium (VOLTAREN) 1 % GEL Apply 4 g topically 4 (four) times daily. To affected joint. (Patient not taking: Reported on 11/26/2022) 100 g 11   famotidine (PEPCID) 20 MG tablet Take 1 tablet (20 mg total) by mouth 2 (two) times daily. (Patient not taking: Reported on 11/26/2022) 180 tablet 0   fluticasone (  FLONASE) 50 MCG/ACT nasal spray Place 2 sprays into both nostrils daily. (Patient not taking: Reported on 11/26/2022) 16 g 0   gabapentin (NEURONTIN) 300 MG capsule TAKE 1 CAPSULE BY MOUTH 3 TIMES A DAY AS NEEDED FOR PAIN/NEUROPATHY (Patient not taking: Reported on 11/26/2022) 270  capsule 4   hydrOXYzine (VISTARIL) 100 MG capsule Take 1 capsule (100 mg total) by mouth 3 (three) times daily as needed for anxiety. (Patient not taking: Reported on 11/26/2022) 90 capsule 0   LINZESS 72 MCG capsule Take 72 mcg by mouth every morning. (Patient not taking: Reported on 11/11/2022)     PAIN RELIEF MAXIMUM STRENGTH 4 % Place 1 patch onto the skin daily. (Patient not taking: Reported on 11/11/2022) 30 patch 1   topiramate (TOPAMAX) 100 MG tablet Take one tablet twice a day. (Patient not taking: Reported on 11/26/2022) 180 tablet 1   valACYclovir (VALTREX) 1000 MG tablet TAKE TWO TABLETS BY MOUTH 2 TIMES A DAY AT ONSET OF COLD SORE FOR 2 DAYS (Patient not taking: Reported on 11/26/2022) 60 tablet 0   No current facility-administered medications for this visit.     Psychiatric Specialty Exam: Review of Systems  Cardiovascular:  Negative for chest pain.  Neurological:  Negative for tremors.  Psychiatric/Behavioral:  Positive for sleep disturbance. Negative for suicidal ideas. The patient is nervous/anxious.     Blood pressure 128/84, pulse 99, temperature 98.4 F (36.9 C), height 5' 8.5" (1.74 m), weight 200 lb (90.7 kg), SpO2 97 %.Body mass index is 29.97 kg/m.  General Appearance: Casual  Eye Contact:  Fair  Speech:  Slow  Volume:  Decreased  Mood:  Dysphoric  Affect:  Congruent  Thought Process:  Goal Directed  Orientation:  Full (Time, Place, and Person)  Thought Content:  Rumination  Suicidal Thoughts:  No  Homicidal Thoughts:  No  Memory:  Immediate;   Fair  Judgement:  Fair  Insight:  Fair  Psychomotor Activity:  Decreased  Concentration:  Concentration: Fair  Recall:  Smiley House[place] of [place]: Fair  Akathisia:  No  Handed:    AIMS (if indicated):  not done  Assets:  Desire for Improvement Social Support  ADL's:  Intact  Cognition: WNL  Sleep:   irregular   Screenings: GAD-7    Flowsheet Row Office Visit from 10/16/2022 in [place] at [place] Visit from 05/29/2022 in [place] at [place] Visit from 03/21/2022 in [place] at [place] Visit from 10/31/2021 in [place] at [place] Visit from 09/04/2021 in [place] at [facility]  Total GAD-7 Score '19 8 13 9 6      '$ PHQ2-9    [facility] Office Visit from 11/26/2022 in [place] at [place] from 10/29/2022 in [place] at [place] Visit from 10/16/2022 in [place] at [place] from 10/08/2022 in [place] Visit from 05/29/2022 in [place] at [facility]  PHQ-2 Total Score '4 6 4 6 2  '$ PHQ-9 Total Score '14 23 23 27 10      '$ [place] Office Visit from 11/26/2022 in [place] at [place] from 10/29/2022 in  [place] at [place] Error: Q3, 4, or 5 should not be populated when Q2 is No Moderate Risk       Assessment and Plan: as follows  Major depressive disorder recurrent moderate; depression seemingly is improving as well we will continue and also Lamictal discussed to take gabapentin more so regularly to help with fibromyalgia pain and also sleep  Generalized anxiety disorder continue BuSpar but currently down to 2 times a day as she is on multiple medications she can continue gabapentin also on Klonopin she can take as needed discussed to work on coping skills breathing techniques  Panic attacks; discussed breathing techniques and also can  carry Klonopin continue gabapentin but take so more regularly  Insomnia; reviewed sleep hygiene take trazodone at 11:00 (11:00.  She is need to get his new CPAP machine that will help and possible panic attacks are due to irregular sleep with apneic events at night causing exacerbating panic attacks  Reviewed sleep hygiene She has medications for now discussed on different medication including gabapentin she is also on Topamax, Flexeril as needed she understands she may be on too many medications she will work through it with mostly take medication which are needed and on a regular basis add activities and positivity to work on coping skills  Follow-up in 1 month  Direct care time spent in office 60 minutes with chart review, documentation and face-to-face  Collaboration of Care:   Patient/Guardian was advised Release of Information must be obtained prior to any record release in order to collaborate their care with an outside provider. Patient/Guardian was advised if they have not already done so to contact the registration department to sign all necessary forms in order for [place] to release information regarding their care.   Consent: Patient/Guardian gives verbal consent for treatment and assignment of benefits for services provided during this visit. Patient/Guardian expressed understanding and agreed to proceed.   Merian Capron, MD 2/27/20249:16 AM

## 2022-12-02 ENCOUNTER — Ambulatory Visit (INDEPENDENT_AMBULATORY_CARE_PROVIDER_SITE_OTHER): Payer: Medicare Other | Admitting: Licensed Clinical Social Worker

## 2022-12-02 ENCOUNTER — Encounter (HOSPITAL_COMMUNITY): Payer: Self-pay

## 2022-12-02 DIAGNOSIS — F332 Major depressive disorder, recurrent severe without psychotic features: Secondary | ICD-10-CM

## 2022-12-02 DIAGNOSIS — F410 Panic disorder [episodic paroxysmal anxiety] without agoraphobia: Secondary | ICD-10-CM

## 2022-12-02 DIAGNOSIS — F411 Generalized anxiety disorder: Secondary | ICD-10-CM

## 2022-12-02 NOTE — Progress Notes (Signed)
Therapist contacted patient through text and she did not show.

## 2022-12-09 ENCOUNTER — Other Ambulatory Visit: Payer: Self-pay | Admitting: Physician Assistant

## 2022-12-09 ENCOUNTER — Ambulatory Visit (INDEPENDENT_AMBULATORY_CARE_PROVIDER_SITE_OTHER): Payer: Medicare Other | Admitting: Licensed Clinical Social Worker

## 2022-12-09 DIAGNOSIS — F4323 Adjustment disorder with mixed anxiety and depressed mood: Secondary | ICD-10-CM

## 2022-12-09 DIAGNOSIS — F0630 Mood disorder due to known physiological condition, unspecified: Secondary | ICD-10-CM | POA: Diagnosis not present

## 2022-12-09 DIAGNOSIS — G43909 Migraine, unspecified, not intractable, without status migrainosus: Secondary | ICD-10-CM

## 2022-12-09 DIAGNOSIS — F410 Panic disorder [episodic paroxysmal anxiety] without agoraphobia: Secondary | ICD-10-CM

## 2022-12-09 DIAGNOSIS — F411 Generalized anxiety disorder: Secondary | ICD-10-CM

## 2022-12-09 DIAGNOSIS — K2100 Gastro-esophageal reflux disease with esophagitis, without bleeding: Secondary | ICD-10-CM

## 2022-12-09 DIAGNOSIS — F332 Major depressive disorder, recurrent severe without psychotic features: Secondary | ICD-10-CM

## 2022-12-09 NOTE — Progress Notes (Signed)
Virtual Visit via Video Note  I connected with Aimee Little on 12/09/22 at 11:00 AM EDT by a video enabled telemedicine application and verified that I am speaking with the correct person using two identifiers.  Location: Patient: home Provider: office   I discussed the limitations of evaluation and management by telemedicine and the availability of in person appointments. The patient expressed understanding and agreed to proceed.   I discussed the assessment and treatment plan with the patient. The patient was provided an opportunity to ask questions and all were answered. The patient agreed with the plan and demonstrated an understanding of the instructions.   The patient was advised to call back or seek an in-person evaluation if the symptoms worsen or if the condition fails to improve as anticipated.  I provided 30 minutes of non-face-to-face time during this encounter.  THERAPIST PROGRESS NOTE  Session Time: 11:00 AM to 11:30 AM  Participation Level: Active  Behavioral Response: CasualAlertEuthymic  Type of Therapy: Individual Therapy  Treatment Goals addressed: Anxiety, panic, depression, coping  ProgressTowards Goals: Progressing-symptoms improving due to psychoeducation by this therapist, by patient taking the initiative by using coping skills such as being more active to help with symptoms as well as medication adjustments helpful and improvement in relationship with son  Interventions: CBT, Solution Focused, Strength-based, Supportive, and Other: coping  Summary: Aimee Little is a 62 y.o. female who presents with feels better with panic attacks. Trying to keep busy. It is a distraction techniques. Does have heart palpitations notice have to check to see if something is wrong. Try to ignore but can't. Sit down and out of the blue heart starts pounding. Has a heart doctor can go to him. Something showed up when did physical he said she was fine nothing to be concerned  over whatever was there not an issue. With these palpitations not feeling like breath and lump in the throat like panic. Something not right will check. Son had a heart to heart and he is trying to do better. He is listening to her and making changes before he wouldn't before said she was creating drama. Patient said hurting me and way she feels it is not drama finally listening to her and being more considerate. A big change. Wife doesn't want him to come see her. Any chance she has to stab in the back she will. Symptoms think come from relationship with son she thinks she does miss him bought her an air fryer for birthday. Fibromyalgia cause fatigue. Was on one antidepressant in bed four times a week all day. Told doctor had to do something. Vyvanse-helping fatigue helping so much, Lamictal for mood. Not manic but sometimes gets really low. Last month think it was a seasonal thing and now so much better now feel so much better with mental. No more fatigue tired but will to get up and do something. 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 Pain helped with anti-inflammatory hasn't had to use hydrocodone. Proud of herself. Has arthritis inflammation At one ponit in bad pain epidural. Had given her Belbuca with pain med didn't touch the pain. Patient said no reason to take pills not helping waited to get epidural and back is so much better. January and February was really down. Activities made her feel better about herself and contributing. Wants to hold with therapy sessions.  So much better not in her head. Is tired change of time and thyroid issues. Going to see about the sleep apnea machine recalled has to get another machine need to get another facial mask has had lot of problems with it. Son told her about a new one that goes over nose. Doctor took her off a bunch of meds says taking too much  medicine knocked that out and that was  making her up and down. Taking Klonopin as needed. She thinks it helps prevent them from coming on don't take everyday when feel anxious and something coming on. 10 pills in a month and half. Try not to take them time when feel something coming on and feel anxious. Took one before hot dog function when anxious took one and did what needed to do. [place]. Apartment is huge and clean. Activities get her out of her head. Also did another dinner and helped serve for it. Said Psychologist, occupational as long as back not bothering her and fibromyalgia not bothering her anti-inflammatory helping. Patient shared helped to talk to therapist at beginning helped out of head more active listened and tried to follow what told her and helped.      Noted some positive things have fallen into place as well as patient taking the initiative for improvement in symptoms.  She has use distraction that is help with panic.  Relationship with son is better and she sees connection to improvement in symptoms.  Patient has pushed herself to get out of apartment therapist noted an important strategy for depression is to be more active we need to be around activities that are positively reinforcing make [place] feel better not negatively reinforcing such as staying in bed.  Noted is well medications are helping with pain issues as well as cutting out meds so that she can be more stable instead of going up and down.  Patient will continue positive steps as she will continue to volunteer.  She will seek medical advice for palpitation to rule out any medical issue.  Noted doing more activities increase self-esteem as well as mood.  Explored direction of therapy patient feels much better would like to discontinue for now therapist agrees due to improvement in symptoms. Suicidal/Homicidal: No  Plan: 1.patient to discontinue therapy continue with positive coping medication management return if needed    Diagnosis: Major depressive disorder, recurrent, severe, generalized anxiety disorder with panic attacks, adjustment disorder with anxiety depression, mood disorder in conditions classified elsewhere   Collaboration of Care: Other none needed  Patient/Guardian was advised Release of Information must be obtained prior to any record release in order to collaborate their care with an outside provider. Patient/Guardian was advised if they have not already done so to contact the registration department to sign all necessary forms in order for [place] to release information regarding their care.   Consent: Patient/Guardian gives verbal consent for treatment and assignment of benefits for services provided during this visit. Patient/Guardian expressed understanding and agreed to proceed.   Cordella Register, LCSW 12/09/2022

## 2022-12-10 ENCOUNTER — Ambulatory Visit: Payer: Medicare Other | Admitting: Psychology

## 2022-12-13 ENCOUNTER — Other Ambulatory Visit: Payer: Medicare Other | Admitting: Pharmacist

## 2022-12-13 NOTE — Patient Instructions (Signed)
Ike Bene chatting with you today! As discussed, here are the goals until we speak again:  1) Contact the 1-800-QUITNOW hotline to ask again about lozenges 2) In meantime, go to dollar store for some sugar free candy to use during cravings or weak moments 3) Set a goal of taking just one car trip, where cigarettes are NOT on your body, in your purse, or in your car whatsoever. Go for an icecream or make the drive with some sort of small treat/destination in mind.  You're doing great - be kind to yourself and keep working toward your goals.  Take care, Luana Shu, PharmD Clinical Pharmacist [facility] [phone]

## 2022-12-13 NOTE — Progress Notes (Signed)
12/13/2022 Name: Aimee Little MRN: [id number] DOB: 1961/08/15   Aimee Little is a 62 y.o. year old female who presented for a telephone visit.   They were referred to the pharmacist by their Case Management Team  for assistance in managing  tobacco cessation .    Subjective:  Care Team: Primary Care Provider: Lavada Mesi ;   Medication Access/Adherence  Current Pharmacy:  [place], [place] - [address] [address] Phone: [phone] Fax: [phone]   Patient reports affordability concerns with their medications: No  Patient reports access/transportation concerns to their pharmacy: No  Patient reports adherence concerns with their medications:  No     Tobacco Abuse:  Tobacco Use History: Age when started using tobacco on a daily basis 56yr Number of cigarettes per day, was 1ppd, was at 2-3 cigarettes for our initial call. Now 6/day. Smokes first cigarette 30 minutes after waking Does not wake at night to smoke Triggers include psychological: after meals, car  Quit Attempt History: Most recent quit attempt 62yr old, during pregnancy of her son Longest time ever been tobacco free: through  Methods tried in the past include Nicotine Patch.   Motivators to quitting include her health & wanting to avoid oxygen therapy; barriers include  habit-based    Current medication access support: utilized the 1-800 quit line. Has 21mg  and 14mg  patches. Currently on the 14mg  patch. Was previously on them through July-October. Started smoking again in December in response to stress with daughter in law, as well as back pain.   Has tried lozenges in past - 2mg . The 4mg  were way too strong.   Objective:  Lab Results  Component Value Date   HGBA1C 4.6 01/22/2021    Lab Results  Component Value Date   CREATININE 0.65 06/11/2022   BUN 11 06/11/2022   NA 143 06/11/2022   K 2.9 (L)  06/11/2022   CL 108 06/11/2022   CO2 26 06/11/2022    Lab Results  Component Value Date   CHOL 125 02/05/2022   HDL 60 02/05/2022   LDLCALC 51 02/05/2022   TRIG 48 02/05/2022   CHOLHDL 2.1 02/05/2022    Medications Reviewed Today     Reviewed by Watt Climes, RN (Registered Nurse) on 11/26/22 at [place] List Status: <None>   Medication Order Taking? Sig Documenting Provider Last Dose Status Informant  AMBULATORY NON FORMULARY MEDICATION [id number] Yes Tens unit for lumbar spine for low back pain, muscle spasm, lumbar DDD. Donella Stade, PA-C Taking Active   amLODipine (NORVASC) 10 MG tablet [id number] Yes Take 1 tablet (10 mg total) by mouth daily. Donella Stade, PA-C Taking Active   atorvastatin (LIPITOR) 40 MG tablet [id number] Yes Take 1 tablet (40 mg total) by mouth daily. Donella Stade, PA-C Taking Active   busPIRone (BUSPAR) 15 MG tablet [id number] Yes Take 1 tablet (15 mg total) by mouth 3 (three) times daily. Lavada Mesi Taking Active   CALCIUM-MAGNESIUM-ZINC PO [id number] Yes Take by mouth daily. [provider] Taking Active   cholecalciferol (VITAMIN D3) 25 MCG (1000 UT) tablet [id number] Yes Take 1,000 Units by mouth daily. [provider] Taking Active   clonazePAM (KLONOPIN) 0.5 MG tablet [id number] Yes Take one tablet as needed for panic attacks. Donella Stade, PA-C Taking Active   cyclobenzaprine (FLEXERIL) 10 MG tablet [id number] Yes Take 1 tablet (10 mg total)  by mouth 3 (three) times daily as needed for muscle spasms. Donella Stade, PA-C Taking Active   diclofenac sodium (VOLTAREN) 1 % GEL [id number] No Apply 4 g topically 4 (four) times daily. To affected joint.  Patient not taking: Reported on 11/26/2022   Gregor Hams, MD Not Taking Active   famotidine (PEPCID) 20 MG tablet [id number] No Take 1 tablet (20 mg total) by mouth 2 (two) times daily.  Patient not taking: Reported on 11/26/2022   Leeanne Rio, MD Not Taking  Active            Med Note Elnora Morrison Nov 26, 2022  9:06 AM) Dewaine Conger as needed  fluticasone [facility]) 50 MCG/ACT nasal spray [id number] No Place 2 sprays into both nostrils daily.  Patient not taking: Reported on 11/26/2022   Leeanne Rio, MD Not Taking Active   gabapentin (NEURONTIN) 300 MG capsule [id number] No TAKE 1 CAPSULE BY MOUTH 3 TIMES A DAY AS NEEDED FOR PAIN/NEUROPATHY  Patient not taking: Reported on 11/26/2022   Gregor Hams, MD Not Taking Active            Med Note Rikki Spearing Nov 11, 2022  2:12 PM) Managed by pain doctor  hydrochlorothiazide (HYDRODIURIL) 50 MG tablet [id number] Yes Take 1 tablet (50 mg total) by mouth daily.  Patient taking differently: Take 25 mg by mouth daily.   Donella Stade, PA-C Taking Active            Med Note Rikki Spearing Nov 11, 2022  2:13 PM) Taking half tablets as of 11/11/22 review  HYDROcodone-acetaminophen (NORCO) 7.5-325 MG tablet [id number] Yes Take 1 tablet by mouth every 8 (eight) hours as needed. [provider] Taking Active            Med Note Watt Climes   Tue Nov 26, 2022  9:07 AM) Dewaine Conger as needed  hydrOXYzine (VISTARIL) 100 MG capsule [id number] No Take 1 capsule (100 mg total) by mouth 3 (three) times daily as needed for anxiety.  Patient not taking: Reported on 11/26/2022   Lavada Mesi Not Taking Active   INCRUSE ELLIPTA 62.5 MCG/ACT AEPB [id number] Yes Inhale 1 puff into the lungs daily. Donella Stade, PA-C Taking Active   lamoTRIgine (LAMICTAL) 100 MG tablet [id number] Yes Take 1 tablet (100 mg total) by mouth daily. Donella Stade, PA-C Taking Active            Med Note ([place] Nov 26, 2022  9:08 AM) Takes 0.5 tablets per day (50 mg).   levothyroxine (SYNTHROID) 150 MCG tablet [id number] Yes Take 1 tab (168mcg) by mouth Mon, Fri. Take 1.5 tabs (223mcg) Sun, Tues, Wed, Thurs & Sat. Take on empty stomach at least 30 minutes prior to food or other medications.  Donella Stade, PA-C Taking Active   LINZESS 72 MCG capsule [id number] No Take 72 mcg by mouth every morning.  Patient not taking: Reported on 11/11/2022   [provider] Not Taking Active   metoCLOPramide (REGLAN) 10 MG tablet [id number] Yes TAKE ONE TABLET BY MOUTH FOUR TIMES DAILY - BEFORE meals AND AT BEDTIME Breeback, [name] L, PA-C Taking Active   nabumetone (RELAFEN) 500 MG tablet [id number] Yes Take 500 mg by mouth 2 (two) times daily as needed for moderate pain. [provider]  Active   Omega 3-6-9 Fatty Acids (OMEGA 3-6-9 COMPLEX  PO) [id number] Yes Take 1 capsule by mouth daily.  [provider] Taking Active   omeprazole (PRILOSEC) 40 MG capsule [id number] Yes Take 1 capsule (40 mg total) by mouth in the morning and at bedtime. Breeback, [name] L, PA-C Taking Active   PAIN RELIEF MAXIMUM STRENGTH 4 % [id number] No Place 1 patch onto the skin daily.  Patient not taking: Reported on 11/11/2022   Donella Stade, PA-C Not Taking Active   rizatriptan (MAXALT-MLT) 10 MG disintegrating tablet [id number] Yes May repeat in 2 hours if needed for migraine Breeback, [name] L, PA-C Taking Active   rOPINIRole (REQUIP) 1 MG tablet [id number] Yes Take 1 tablet (1 mg total) by mouth 3 (three) times daily. Donella Stade, PA-C Taking Active   topiramate (TOPAMAX) 100 MG tablet [id number] No Take one tablet twice a day.  Patient not taking: Reported on 11/26/2022   Donella Stade, PA-C Not Taking Active   traZODone (DESYREL) 50 MG tablet [id number] Yes Take 3 tablets (150 mg total) by mouth at bedtime as needed for sleep. Donella Stade, PA-C Taking Active   valACYclovir (VALTREX) 1000 MG tablet [id number] No TAKE TWO TABLETS BY MOUTH 2 TIMES A DAY AT ONSET OF COLD SORE FOR 2 DAYS  Patient not taking: Reported on 11/26/2022   Donella Stade, PA-C Not Taking Active   Vilazodone HCl (VIIBRYD) 40 MG TABS [id number] Yes Take 1 tablet (40 mg total) by mouth daily. Donella Stade, PA-C  Taking Active               Assessment/Plan:   Tobacco Abuse - Currently uncontrolled, but with progress and future plans in place - Provided motivational interviewing to assess tobacco use and strategies for reduction - Provided information on 1 800 QUIT NOW support program - Patient is already utilizing patch via quit line, provided counseling on available options and efficacy for dual nicotine replacement therapy. She is amenable to using lozenges. - Recommend patient follow-up with quit line, who stated the lozenges were on backorder at the time. Will ask again and try to start lozenge + gum to assist in reducing daily cigarette amounts. In meantime, advised patient to go to dollar store for sugar-free candy to utilize. - Recommend patient attempt one car drive where cigarettes are not at all on her person or within the car as a way to set one small goal to achieve.  Follow Up Plan: 2-3 weeks   Larinda Buttery, PharmD Clinical Pharmacist [facility] [phone]

## 2022-12-16 DIAGNOSIS — I10 Essential (primary) hypertension: Secondary | ICD-10-CM | POA: Diagnosis not present

## 2022-12-16 DIAGNOSIS — M546 Pain in thoracic spine: Secondary | ICD-10-CM | POA: Diagnosis not present

## 2022-12-16 DIAGNOSIS — M545 Low back pain, unspecified: Secondary | ICD-10-CM | POA: Diagnosis not present

## 2022-12-16 DIAGNOSIS — Z6830 Body mass index (BMI) 30.0-30.9, adult: Secondary | ICD-10-CM | POA: Diagnosis not present

## 2022-12-16 DIAGNOSIS — G8929 Other chronic pain: Secondary | ICD-10-CM | POA: Diagnosis not present

## 2022-12-16 DIAGNOSIS — F17210 Nicotine dependence, cigarettes, uncomplicated: Secondary | ICD-10-CM | POA: Diagnosis not present

## 2022-12-16 DIAGNOSIS — Z79899 Other long term (current) drug therapy: Secondary | ICD-10-CM | POA: Diagnosis not present

## 2022-12-16 DIAGNOSIS — M47814 Spondylosis without myelopathy or radiculopathy, thoracic region: Secondary | ICD-10-CM | POA: Diagnosis not present

## 2022-12-16 DIAGNOSIS — R0602 Shortness of breath: Secondary | ICD-10-CM | POA: Diagnosis not present

## 2022-12-16 DIAGNOSIS — E6609 Other obesity due to excess calories: Secondary | ICD-10-CM | POA: Diagnosis not present

## 2022-12-17 ENCOUNTER — Ambulatory Visit: Payer: Medicare Other | Admitting: Psychology

## 2022-12-18 ENCOUNTER — Ambulatory Visit (HOSPITAL_COMMUNITY): Payer: Medicare Other | Admitting: Licensed Clinical Social Worker

## 2022-12-24 ENCOUNTER — Ambulatory Visit (HOSPITAL_COMMUNITY): Payer: Medicare Other | Admitting: Psychiatry

## 2022-12-24 ENCOUNTER — Encounter (HOSPITAL_COMMUNITY): Payer: Self-pay

## 2022-12-24 DIAGNOSIS — I10 Essential (primary) hypertension: Secondary | ICD-10-CM | POA: Diagnosis not present

## 2022-12-24 DIAGNOSIS — F17210 Nicotine dependence, cigarettes, uncomplicated: Secondary | ICD-10-CM | POA: Diagnosis not present

## 2022-12-24 DIAGNOSIS — G8929 Other chronic pain: Secondary | ICD-10-CM | POA: Diagnosis not present

## 2022-12-24 DIAGNOSIS — M546 Pain in thoracic spine: Secondary | ICD-10-CM | POA: Diagnosis not present

## 2022-12-24 DIAGNOSIS — M545 Low back pain, unspecified: Secondary | ICD-10-CM | POA: Diagnosis not present

## 2022-12-24 DIAGNOSIS — M47814 Spondylosis without myelopathy or radiculopathy, thoracic region: Secondary | ICD-10-CM | POA: Diagnosis not present

## 2022-12-24 DIAGNOSIS — Z6831 Body mass index (BMI) 31.0-31.9, adult: Secondary | ICD-10-CM | POA: Diagnosis not present

## 2022-12-24 DIAGNOSIS — E6609 Other obesity due to excess calories: Secondary | ICD-10-CM | POA: Diagnosis not present

## 2022-12-25 ENCOUNTER — Telehealth: Payer: Self-pay | Admitting: Physician Assistant

## 2022-12-25 NOTE — Telephone Encounter (Signed)
We got an EKG from pain clinic. Compared to EKG in 2021. Minimal changes. If you are having any cardiac symptoms please make appt to discuss in more detail.

## 2022-12-26 ENCOUNTER — Encounter (HOSPITAL_COMMUNITY): Payer: Self-pay | Admitting: Psychiatry

## 2022-12-26 ENCOUNTER — Ambulatory Visit (HOSPITAL_COMMUNITY): Payer: Medicare Other | Admitting: Psychiatry

## 2022-12-26 VITALS — BP 126/70 | HR 82 | Temp 98.5°F | Ht 67.5 in | Wt 209.0 lb

## 2022-12-26 DIAGNOSIS — F332 Major depressive disorder, recurrent severe without psychotic features: Secondary | ICD-10-CM

## 2022-12-26 DIAGNOSIS — F411 Generalized anxiety disorder: Secondary | ICD-10-CM | POA: Diagnosis not present

## 2022-12-26 DIAGNOSIS — F4323 Adjustment disorder with mixed anxiety and depressed mood: Secondary | ICD-10-CM

## 2022-12-26 DIAGNOSIS — F410 Panic disorder [episodic paroxysmal anxiety] without agoraphobia: Secondary | ICD-10-CM | POA: Diagnosis not present

## 2022-12-26 MED ORDER — CLONAZEPAM 0.5 MG PO TABS: ORAL_TABLET | ORAL | 0 refills | Status: DC

## 2022-12-26 NOTE — Telephone Encounter (Signed)
Patient made aware. She is having some occasional palpitations. She was busy and states she will call back to schedule a visit to discuss with Carline Dura.

## 2022-12-26 NOTE — Progress Notes (Signed)
[facility] Follow up visit   Patient Identification: Aimee Little MRN:  [id number] Date of Evaluation:  12/26/2022 Referral Source: Luvenia Starch Chief Complaint:   No chief complaint on file. Follow up depression, anxiety  Visit Diagnosis:    ICD-10-CM   1. Severe episode of recurrent major depressive disorder, without psychotic features ([place])  F33.2     2. Generalized anxiety disorder with panic attacks  F41.1    F41.0     3. Adjustment disorder with mixed anxiety and depressed mood  F43.23     4. Panic attacks  F41.0 clonazePAM (KLONOPIN) 0.5 MG tablet      History of Present Illness: Patient is a 62 years old currently divorced function female living by self she is on disability because of fibromyalgia and degenerative joint disease she was initially referred by primary care physician to establish care for depression and anxiety  Doing fair but has fibro and neuropathy pain, some improved mood but gets stressed and wants to continue low dose klonoopine, not on buspar or lamictal now   Has some support system   Aggravating factors; difficult marriage  Medical comorbidities  Modifying factors; friends, family, sister  Severity anxious without klonopine   Past Psychiatric History: depression, anxiety, panic attacks   Previous Psychotropic Medications: Yes  Many different meds  Substance Abuse History in the last 12 months:  No.  Consequences of Substance Abuse: NA  Past Medical History:  Past Medical History:  Diagnosis Date   DDD (degenerative disc disease), lumbar    Depression    Fibromyalgia    Insomnia    Migraine    Thyroid disease    Tobacco use     Past Surgical History:  Procedure Laterality Date   ABDOMINAL HYSTERECTOMY     took uterus and both ovaries left    Family Psychiatric History: not know  Family History:  Family History  Problem Relation Age of Onset   Depression Mother    Hyperlipidemia Mother    Hypertension Mother    Heart attack Father     Hypertension Sister    Diabetes Sister    Aortic aneurysm Sister    Alcohol abuse Brother    Hypertension Brother    Breast cancer Cousin    Breast cancer Cousin    Breast cancer Cousin    Breast cancer Cousin    Breast cancer Cousin     Social History:   Social History   Socioeconomic History   Marital status: Divorced    Spouse name: Not on file   Number of children: Not on file   Years of education: Not on file   Highest education level: Not on file  Occupational History   Not on file  Tobacco Use   Smoking status: Light Smoker    Packs/day: .25    Types: Cigarettes    Last attempt to quit: 10/02/2013    Years since quitting: 9.2   Smokeless tobacco: Never   Tobacco comments:    Reports  trying to quit but had not done as well the past 2 weeks.   Substance and Sexual Activity   Alcohol use: Not Currently    Comment: Occasional drink - 1 beer a month   Drug use: No   Sexual activity: Not Currently  Other Topics Concern   Not on file  Social History Narrative   Not on file   Social Determinants of Health   Financial Resource Strain: Low Risk  (06/02/2021)   Overall Financial  Resource Strain (CARDIA)    Difficulty of Paying Living Expenses: Not hard at all  Food Insecurity: Food Insecurity Present (10/08/2022)   Hunger Vital Sign    Worried About Running Out of Food in the Last Year: Sometimes true    Ran Out of Food in the Last Year: Sometimes true  Transportation Needs: No Transportation Needs (10/08/2022)   PRAPARE - Hydrologist (Medical): No    Lack of Transportation (Non-Medical): No  Physical Activity: Inactive (06/02/2021)   Exercise Vital Sign    Days of Exercise per Week: 0 days    Minutes of Exercise per Session: 0 min  Stress: Stress Concern Present (06/02/2021)   [place]    Feeling of Stress : To some extent  Social Connections: Moderately Isolated  (06/02/2021)   Social Connection and Isolation Panel [NHANES]    Frequency of Communication with Friends and Family: More than three times a week    Frequency of Social Gatherings with Friends and Family: More than three times a week    Attends Religious Services: 1 to 4 times per year    Active Member of [employer] or Organizations: No    Attends Archivist Meetings: Never    Marital Status: Divorced    Allergies:   Allergies  Allergen Reactions   Doxepin Other (See Comments)   Pristiq [Desvenlafaxine Succinate Er]     Depression worse.   Remeron [Mirtazapine]     Weird dreams   Estradiol Other (See Comments)    Burning    Lexapro [Escitalopram Oxalate]     jittery   Lisinopril     cough   Losartan     itching   Mirapex [Pramipexole Dihydrochloride]     Nausea and vomiting   Phentermine Diarrhea    Stomach ache/increased anxiety.    Pristiq [Desvenlafaxine] Other (See Comments)    Increased depression   Seroquel [Quetiapine Fumarate]     hallicinations   Trintellix [Vortioxetine]     RLS/increased anxiety    Metabolic Disorder Labs: Lab Results  Component Value Date   HGBA1C 4.6 01/22/2021   No results found for: "PROLACTIN" Lab Results  Component Value Date   CHOL 125 02/05/2022   TRIG 48 02/05/2022   HDL 60 02/05/2022   CHOLHDL 2.1 02/05/2022   VLDL 18 12/06/2016   LDLCALC 51 02/05/2022   LDLCALC 102 01/22/2021   Lab Results  Component Value Date   TSH 2.20 10/16/2022    Therapeutic Level Labs: No results found for: "LITHIUM" No results found for: "CBMZ" No results found for: "VALPROATE"  Current Medications: Current Outpatient Medications  Medication Sig Dispense Refill   AMBULATORY NON FORMULARY MEDICATION Tens unit for lumbar spine for low back pain, muscle spasm, lumbar DDD. 1 Device 0   amLODipine (NORVASC) 10 MG tablet Take 1 tablet (10 mg total) by mouth daily. 90 tablet 0   atorvastatin (LIPITOR) 40 MG tablet Take 1 tablet (40 mg  total) by mouth daily. 90 tablet 0   BELBUCA 450 MCG FILM Place 450 mcg inside cheek daily.     CALCIUM-MAGNESIUM-ZINC PO Take by mouth daily.     cholecalciferol (VITAMIN D3) 25 MCG (1000 UT) tablet Take 1,000 Units by mouth daily.     famotidine (PEPCID) 20 MG tablet Take 1 tablet (20 mg total) by mouth 2 (two) times daily. 180 tablet 0   gabapentin (NEURONTIN) 300 MG capsule TAKE 1 CAPSULE  BY MOUTH 3 TIMES A DAY AS NEEDED FOR PAIN/NEUROPATHY 270 capsule 4   hydrochlorothiazide (HYDRODIURIL) 50 MG tablet Take 1 tablet (50 mg total) by mouth daily. (Patient taking differently: Take 25 mg by mouth daily.) 90 tablet 1   INCRUSE ELLIPTA 62.5 MCG/ACT AEPB Inhale 1 puff into the lungs daily. 90 each 3   levothyroxine (SYNTHROID) 150 MCG tablet Take 1 tab (189mcg) by mouth Mon, Fri. Take 1.5 tabs (221mcg) Sun, Tues, Wed, Thurs & Sat. Take on empty stomach at least 30 minutes prior to food or other medications. 114 tablet 1   metoCLOPramide (REGLAN) 10 MG tablet TAKE ONE TABLET BY MOUTH FOUR TIMES DAILY - BEFORE meals AND AT BEDTIME 120 tablet 1   nabumetone (RELAFEN) 500 MG tablet Take 500 mg by mouth 2 (two) times daily as needed for moderate pain.     Omega 3-6-9 Fatty Acids (OMEGA 3-6-9 COMPLEX PO) Take 1 capsule by mouth daily.      omeprazole (PRILOSEC) 40 MG capsule Take 1 capsule (40 mg total) by mouth in the morning and at bedtime. 180 capsule 3   rizatriptan (MAXALT-MLT) 10 MG disintegrating tablet May repeat in 2 hours if needed for migraine 30 tablet 1   topiramate (TOPAMAX) 100 MG tablet Take one tablet twice a day. 180 tablet 1   traZODone (DESYREL) 50 MG tablet Take 3 tablets (150 mg total) by mouth at bedtime as needed for sleep. 90 tablet 5   Vilazodone HCl (VIIBRYD) 40 MG TABS Take 1 tablet (40 mg total) by mouth daily. 90 tablet 0   clonazePAM (KLONOPIN) 0.5 MG tablet Take one tablet as needed for panic attacks. 20 tablet 0   cyclobenzaprine (FLEXERIL) 10 MG tablet Take 1 tablet (10 mg  total) by mouth 3 (three) times daily as needed for muscle spasms. (Patient not taking: Reported on 12/26/2022) 180 tablet 2   diclofenac sodium (VOLTAREN) 1 % GEL Apply 4 g topically 4 (four) times daily. To affected joint. (Patient not taking: Reported on 11/26/2022) 100 g 11   fluticasone (FLONASE) 50 MCG/ACT nasal spray Place 2 sprays into both nostrils daily. (Patient not taking: Reported on 11/26/2022) 16 g 0   LINZESS 72 MCG capsule Take 72 mcg by mouth every morning. (Patient not taking: Reported on 11/11/2022)     PAIN RELIEF MAXIMUM STRENGTH 4 % Place 1 patch onto the skin daily. (Patient not taking: Reported on 11/11/2022) 30 patch 1   rOPINIRole (REQUIP) 1 MG tablet Take 1 tablet (1 mg total) by mouth 3 (three) times daily. (Patient not taking: Reported on 12/26/2022) 270 tablet 1   valACYclovir (VALTREX) 1000 MG tablet TAKE TWO TABLETS BY MOUTH 2 TIMES A DAY AT ONSET OF COLD SORE FOR 2 DAYS (Patient not taking: Reported on 11/26/2022) 60 tablet 0   No current facility-administered medications for this visit.     Psychiatric Specialty Exam: Review of Systems  Cardiovascular:  Negative for chest pain.  Neurological:  Negative for tremors.  Psychiatric/Behavioral:  Negative for suicidal ideas. The patient is nervous/anxious.     Blood pressure 126/70, pulse 82, temperature 98.5 F (36.9 C), height 5' 7.5" (1.715 m), weight 209 lb (94.8 kg), SpO2 99 %.Body mass index is 32.25 kg/m.  General Appearance: Casual  Eye Contact:  Fair  Speech:  Slow  Volume:  Decreased  Mood:  somewhat stressed  Affect:  Congruent  Thought Process:  Goal Directed  Orientation:  Full (Time, Place, and Person)  Thought Content:  Rumination  Suicidal Thoughts:  No  Homicidal Thoughts:  No  Memory:  Immediate;   Fair  Judgement:  Fair  Insight:  Fair  Psychomotor Activity:  Decreased  Concentration:  Concentration: Fair  Recall:  [employer] [place]: Fair  Akathisia:  No  Handed:     AIMS (if indicated):  not done  Assets:  Desire for Improvement Social Support  ADL's:  Intact  Cognition: WNL  Sleep:   irregular   Screenings: GAD-7    Flowsheet Row Office Visit from 10/16/2022 in [place] at [place] Visit from 05/29/2022 in [place] at [place] Visit from 03/21/2022 in [place] at [place] Visit from 10/31/2021 in [place] at [place] Visit from 09/04/2021 in [place] at [facility]  Total GAD-7 Score 19 8 13 9 6       PHQ2-9    [place] Visit from 11/26/2022 in [place] at [place] from 10/29/2022 in [place] at [place] Visit from 10/16/2022 in [place] at [place] from 10/08/2022 in [place] Visit from 05/29/2022 in [place] at [facility]  PHQ-2 Total Score 4 6 4 6 2   PHQ-9 Total Score 14 23 23 27 10       [place] Office Visit from 11/26/2022 in [place] at [place] from 10/29/2022 in [place] at [place] Error: Q3, 4, or 5 should not be populated when Q2 is No Moderate Risk       Assessment and Plan: as follows Prior documentation reviewed  Major depressive disorder recurrent moderate; fair continue gaba and viibryd  Generalized anxiety disorder : gets stressed, wants to continue klonopine low dose , will refill  Panic attacks; discussed breathing , continue klonoine, also on gaba   Insomnia;  reviewed sleep hyginee, continue trazadone    Follow-up in 1- 2 months   Direct care time spent in office  20 minutes  with chart review, documentation and face-to-face  Collaboration of Care:   Patient/Guardian was advised Release of Information must be obtained prior to any record release in order to collaborate their care with an outside provider. Patient/Guardian was advised if they have not already done so to contact the registration department to sign all necessary forms in order for [place] to release information regarding their care.   Consent: Patient/Guardian gives verbal consent for treatment and assignment of benefits for services provided during this visit. Patient/Guardian expressed understanding and agreed to proceed.   Merian Capron, MD 3/28/20249:44 AM

## 2022-12-27 ENCOUNTER — Other Ambulatory Visit: Payer: Medicare Other | Admitting: Pharmacist

## 2022-12-27 NOTE — Patient Instructions (Signed)
[place],  Great work on reaching our goals in between calls!!! You are making progress!!  As discussed, your next goals are: 1) choose a day (maybe April 1st, or tomorrow depending on your preference) and try one whole day without a single cigarette. 2) Call the quitline this afternoon for more patches  You're doing great - have a great weekend and we will touch base in 2 weeks.  Take care, Luana Shu, PharmD Clinical Pharmacist [facility] [phone]

## 2022-12-27 NOTE — Progress Notes (Signed)
12/27/2022 Name: Aimee Little MRN: [id number] DOB: 07/26/1961   Aimee Little Aimee Little is a 62 y.o. year old female who presented for a telephone visit.   They were referred to the pharmacist by their Case Management Team  for assistance in managing  tobacco cessation .    Subjective:  Care Team: Primary Care Provider: Lavada Mesi ;   Medication Access/Adherence  Current Pharmacy:  [place], [place] - [address] [address] Phone: [phone] Fax: [phone]   Patient reports affordability concerns with their medications: No  Patient reports access/transportation concerns to their pharmacy: No  Patient reports adherence concerns with their medications:  No     Tobacco Abuse:  Tobacco Use History: Age when started using tobacco on a daily basis 57yr Number of cigarettes per day, was 1ppd, was at 6 cigarettes for our previous call. Now 3/day, trying to restrict after eating.  Smokes first cigarette 30 minutes after waking Does not wake at night to smoke Triggers include psychological: after meals, car  Quit Attempt History: Most recent quit attempt 62yr old, during pregnancy of her son Longest time ever been tobacco free: through  Methods tried in the past include Nicotine Patch.   Motivators to quitting include her health & wanting to avoid oxygen therapy; barriers include  habit-based    Current medication access support: utilized the 1-800 quit line. Has 21mg  and 14mg  patches. Currently on the 14mg  patch. Was previously on them through July-October. Started smoking again in December in response to stress with daughter in law, as well as back pain.   Has tried lozenges in past - 2mg . The 4mg  were way too strong.   Objective:  Lab Results  Component Value Date   HGBA1C 4.6 01/22/2021    Lab Results  Component Value Date   CREATININE 0.65 06/11/2022   BUN 11 06/11/2022   NA  143 06/11/2022   K 2.9 (L) 06/11/2022   CL 108 06/11/2022   CO2 26 06/11/2022    Lab Results  Component Value Date   CHOL 125 02/05/2022   HDL 60 02/05/2022   LDLCALC 51 02/05/2022   TRIG 48 02/05/2022   CHOLHDL 2.1 02/05/2022    Medications Reviewed Today     Reviewed by Merian Capron, MD (Psychiatrist) on 12/26/22 at [phone]  Med List Status: <None>   Medication Order Taking? Sig Documenting Provider Last Dose Status Informant  AMBULATORY NON FORMULARY MEDICATION [id number] Yes Tens unit for lumbar spine for low back pain, muscle spasm, lumbar DDD. Donella Stade, PA-C Taking Active   amLODipine (NORVASC) 10 MG tablet [id number] Yes Take 1 tablet (10 mg total) by mouth daily. Donella Stade, PA-C Taking Active   atorvastatin (LIPITOR) 40 MG tablet [id number] Yes Take 1 tablet (40 mg total) by mouth daily. Lavada Mesi Taking Active   BELBUCA 450 MCG FILM [id number] Yes Place 450 mcg inside cheek daily. [provider] Taking Active   CALCIUM-MAGNESIUM-ZINC PO [id number] Yes Take by mouth daily. [provider] Taking Active   cholecalciferol (VITAMIN D3) 25 MCG (1000 UT) tablet [id number] Yes Take 1,000 Units by mouth daily. [provider] Taking Active   clonazePAM (KLONOPIN) 0.5 MG tablet [id number]  Take one tablet as needed for panic attacks. Merian Capron, MD  Active   cyclobenzaprine (FLEXERIL) 10 MG tablet [id number] No Take 1 tablet (10 mg total) by mouth 3 (three) times  daily as needed for muscle spasms.  Patient not taking: Reported on 12/26/2022   Donella Stade, [name] Not Taking Active   diclofenac sodium (VOLTAREN) 1 % GEL [id number] No Apply 4 g topically 4 (four) times daily. To affected joint.  Patient not taking: Reported on 11/26/2022   Gregor Hams, MD Not Taking Active   famotidine (PEPCID) 20 MG tablet [id number] Yes Take 1 tablet (20 mg total) by mouth 2 (two) times daily. Leeanne Rio, MD Taking Active            Med Note  Watt Climes   Tue Nov 26, 2022  9:06 AM) Dewaine Conger as needed  fluticasone [facility]) 50 MCG/ACT nasal spray [id number] No Place 2 sprays into both nostrils daily.  Patient not taking: Reported on 11/26/2022   Leeanne Rio, MD Not Taking Active   gabapentin (NEURONTIN) 300 MG capsule [id number] Yes TAKE 1 CAPSULE BY MOUTH 3 TIMES A DAY AS NEEDED FOR Ysidro Evert, MD Taking Active            Med Note Rikki Spearing Nov 11, 2022  2:12 PM) Managed by pain doctor  hydrochlorothiazide (HYDRODIURIL) 50 MG tablet [id number] Yes Take 1 tablet (50 mg total) by mouth daily.  Patient taking differently: Take 25 mg by mouth daily.   Donella Stade, PA-C Taking Active            Med Note Rikki Spearing Nov 11, 2022  2:13 PM) Taking half tablets as of 11/11/22 review  INCRUSE ELLIPTA 62.5 MCG/ACT AEPB [id number] Yes Inhale 1 puff into the lungs daily. Donella Stade, PA-C Taking Active   levothyroxine (SYNTHROID) 150 MCG tablet [id number] Yes Take 1 tab (161mcg) by mouth Mon, Fri. Take 1.5 tabs (232mcg) Sun, Tues, Wed, Thurs & Sat. Take on empty stomach at least 30 minutes prior to food or other medications. Donella Stade, PA-C Taking Active   LINZESS 72 MCG capsule [id number] No Take 72 mcg by mouth every morning.  Patient not taking: Reported on 11/11/2022   [provider] Not Taking Active   metoCLOPramide (REGLAN) 10 MG tablet [id number] Yes TAKE ONE TABLET BY MOUTH FOUR TIMES DAILY - BEFORE meals AND AT BEDTIME Breeback, [name] L, PA-C Taking Active   nabumetone (RELAFEN) 500 MG tablet [id number] Yes Take 500 mg by mouth 2 (two) times daily as needed for moderate pain. [provider] Taking Active   Omega 3-6-9 Fatty Acids (OMEGA 3-6-9 COMPLEX PO) [id number] Yes Take 1 capsule by mouth daily.  [provider] Taking Active   omeprazole (PRILOSEC) 40 MG capsule [id number] Yes Take 1 capsule (40 mg total) by mouth in the morning and at bedtime.  Breeback, [name] L, PA-C Taking Active   PAIN RELIEF MAXIMUM STRENGTH 4 % [id number] No Place 1 patch onto the skin daily.  Patient not taking: Reported on 11/11/2022   Donella Stade, PA-C Not Taking Active   rizatriptan (MAXALT-MLT) 10 MG disintegrating tablet [id number] Yes May repeat in 2 hours if needed for migraine Breeback, [name] L, PA-C Taking Active   rOPINIRole (REQUIP) 1 MG tablet [id number] No Take 1 tablet (1 mg total) by mouth 3 (three) times daily.  Patient not taking: Reported on 12/26/2022   Donella Stade, PA-C Not Taking Active   topiramate (TOPAMAX) 100 MG tablet [id number] Yes Take one tablet twice a day. Donella Stade, PA-C Taking Active   traZODone (DESYREL)  50 MG tablet [id number] Yes Take 3 tablets (150 mg total) by mouth at bedtime as needed for sleep. Donella Stade, PA-C Taking Active   valACYclovir (VALTREX) 1000 MG tablet [id number] No TAKE TWO TABLETS BY MOUTH 2 TIMES A DAY AT ONSET OF COLD SORE FOR 2 DAYS  Patient not taking: Reported on 11/26/2022   Donella Stade, PA-C Not Taking Active   Vilazodone HCl (VIIBRYD) 40 MG TABS [id number] Yes Take 1 tablet (40 mg total) by mouth daily. Donella Stade, PA-C Taking Active               Assessment/Plan:   Tobacco Abuse - Currently uncontrolled, but patient made successful progress and achieved her goals between our calls! - Provided motivational interviewing to assess tobacco use and strategies for reduction - Provided information on 1 800 QUIT NOW support program - Patient is already utilizing patch via quit line, provided counseling on available options and efficacy for dual nicotine replacement therapy. She is amenable to using lozenges. - Her next goal is going one whole day without smoking. Together we chose April 1st to start a new month this way.  -Patient will call quitline this afternoon for more patches.   Follow Up Plan: 2-3 weeks   Larinda Buttery, PharmD Clinical Pharmacist [facility]  [facility] [phone]

## 2023-01-01 DIAGNOSIS — D128 Benign neoplasm of rectum: Secondary | ICD-10-CM | POA: Diagnosis not present

## 2023-01-01 DIAGNOSIS — Z1211 Encounter for screening for malignant neoplasm of colon: Secondary | ICD-10-CM | POA: Diagnosis not present

## 2023-01-01 DIAGNOSIS — Z86010 Personal history of colonic polyps: Secondary | ICD-10-CM | POA: Diagnosis not present

## 2023-01-01 DIAGNOSIS — F32A Depression, unspecified: Secondary | ICD-10-CM | POA: Diagnosis not present

## 2023-01-01 DIAGNOSIS — Z09 Encounter for follow-up examination after completed treatment for conditions other than malignant neoplasm: Secondary | ICD-10-CM | POA: Diagnosis not present

## 2023-01-01 DIAGNOSIS — K635 Polyp of colon: Secondary | ICD-10-CM | POA: Diagnosis not present

## 2023-01-01 DIAGNOSIS — K621 Rectal polyp: Secondary | ICD-10-CM | POA: Diagnosis not present

## 2023-01-01 LAB — HM COLONOSCOPY

## 2023-01-04 ENCOUNTER — Other Ambulatory Visit: Payer: Self-pay | Admitting: Physician Assistant

## 2023-01-04 DIAGNOSIS — F5105 Insomnia due to other mental disorder: Secondary | ICD-10-CM

## 2023-01-08 ENCOUNTER — Encounter: Payer: Self-pay | Admitting: Family Medicine

## 2023-01-08 ENCOUNTER — Other Ambulatory Visit: Payer: Self-pay | Admitting: Family Medicine

## 2023-01-08 DIAGNOSIS — J0111 Acute recurrent frontal sinusitis: Secondary | ICD-10-CM

## 2023-01-08 MED ORDER — FLUCONAZOLE 150 MG PO TABS: 150.0000 mg | ORAL_TABLET | Freq: Once | ORAL | 0 refills | Status: AC

## 2023-01-10 DIAGNOSIS — I10 Essential (primary) hypertension: Secondary | ICD-10-CM | POA: Diagnosis not present

## 2023-01-10 DIAGNOSIS — F17210 Nicotine dependence, cigarettes, uncomplicated: Secondary | ICD-10-CM | POA: Diagnosis not present

## 2023-01-10 DIAGNOSIS — M546 Pain in thoracic spine: Secondary | ICD-10-CM | POA: Diagnosis not present

## 2023-01-10 DIAGNOSIS — M545 Low back pain, unspecified: Secondary | ICD-10-CM | POA: Diagnosis not present

## 2023-01-10 DIAGNOSIS — Z79899 Other long term (current) drug therapy: Secondary | ICD-10-CM | POA: Diagnosis not present

## 2023-01-10 DIAGNOSIS — M1711 Unilateral primary osteoarthritis, right knee: Secondary | ICD-10-CM | POA: Diagnosis not present

## 2023-01-10 DIAGNOSIS — E6609 Other obesity due to excess calories: Secondary | ICD-10-CM | POA: Diagnosis not present

## 2023-01-10 DIAGNOSIS — M47814 Spondylosis without myelopathy or radiculopathy, thoracic region: Secondary | ICD-10-CM | POA: Diagnosis not present

## 2023-01-10 DIAGNOSIS — Z6831 Body mass index (BMI) 31.0-31.9, adult: Secondary | ICD-10-CM | POA: Diagnosis not present

## 2023-01-13 ENCOUNTER — Other Ambulatory Visit: Payer: Medicare Other | Admitting: Pharmacist

## 2023-01-13 DIAGNOSIS — E6609 Other obesity due to excess calories: Secondary | ICD-10-CM | POA: Diagnosis not present

## 2023-01-13 DIAGNOSIS — M545 Low back pain, unspecified: Secondary | ICD-10-CM | POA: Diagnosis not present

## 2023-01-13 DIAGNOSIS — M1711 Unilateral primary osteoarthritis, right knee: Secondary | ICD-10-CM | POA: Diagnosis not present

## 2023-01-13 DIAGNOSIS — M1712 Unilateral primary osteoarthritis, left knee: Secondary | ICD-10-CM | POA: Diagnosis not present

## 2023-01-13 DIAGNOSIS — M7060 Trochanteric bursitis, unspecified hip: Secondary | ICD-10-CM | POA: Diagnosis not present

## 2023-01-13 DIAGNOSIS — F17210 Nicotine dependence, cigarettes, uncomplicated: Secondary | ICD-10-CM | POA: Diagnosis not present

## 2023-01-13 DIAGNOSIS — Z6831 Body mass index (BMI) 31.0-31.9, adult: Secondary | ICD-10-CM | POA: Diagnosis not present

## 2023-01-13 DIAGNOSIS — I10 Essential (primary) hypertension: Secondary | ICD-10-CM | POA: Diagnosis not present

## 2023-01-13 DIAGNOSIS — G8929 Other chronic pain: Secondary | ICD-10-CM | POA: Diagnosis not present

## 2023-01-13 DIAGNOSIS — Z79899 Other long term (current) drug therapy: Secondary | ICD-10-CM | POA: Diagnosis not present

## 2023-01-13 DIAGNOSIS — J439 Emphysema, unspecified: Secondary | ICD-10-CM | POA: Diagnosis not present

## 2023-01-13 DIAGNOSIS — E039 Hypothyroidism, unspecified: Secondary | ICD-10-CM | POA: Diagnosis not present

## 2023-01-13 LAB — HEPATIC FUNCTION PANEL
ALT: 27 U/L (ref 7–35)
AST: 37 — AB (ref 13–35)
Alkaline Phosphatase: 69 (ref 25–125)
Bilirubin, Total: 0.5

## 2023-01-13 LAB — BASIC METABOLIC PANEL
BUN: 14 (ref 4–21)
CO2: 24 — AB (ref 13–22)
Chloride: 105 (ref 99–108)
Creatinine: 0.8 (ref 0.5–1.1)
Glucose: 96
Potassium: 3.2 mEq/L — AB (ref 3.5–5.1)
Sodium: 142 (ref 137–147)

## 2023-01-13 LAB — BUN/CREATININE RATIO
Other: 0.5
Other: 1.4
Other: 12.6
Other: 12.8
Other: 12.9
Other: 30.7
Other: 33
Other: 35.4
Other: 59
Other: 8.1
Other: 94

## 2023-01-13 LAB — CBC AND DIFFERENTIAL
HCT: 41 (ref 36–46)
Hemoglobin: 14.5 (ref 12.0–16.0)
Platelets: 211 10*3/uL (ref 150–400)
WBC: 9.5

## 2023-01-13 LAB — CBC: RBC: 4.35 (ref 3.87–5.11)

## 2023-01-13 LAB — COMPREHENSIVE METABOLIC PANEL
Albumin: 3.9 (ref 3.5–5.0)
Calcium: 9.6 (ref 8.7–10.7)
Globulin: 2.2
eGFR: 70

## 2023-01-13 LAB — TSH: TSH: 0.49 (ref 0.41–5.90)

## 2023-01-13 NOTE — Patient Instructions (Addendum)
Aimee Little,  Keep up the great work! Focus on managing your back pain, and then working on less smoking after that. Aim for one whole day without a cigarette!  We will touch base next Wednesday morning.  Take care, Elmarie Shiley, PharmD, BCPS Clinical Pharmacist [facility]

## 2023-01-13 NOTE — Progress Notes (Signed)
01/13/2023 Name: Aimee Little MRN: [id number] DOB: Jan 30, 1961   Aimee Little is a 62 y.o. year old female who presented for a telephone visit.   They were referred to the pharmacist by their Case Management Team  for assistance in managing  tobacco cessation .    Subjective:  Care Team: Primary Care Provider: Nolene Ebbs ;   Medication Access/Adherence  Current Pharmacy:  [facility] [place], [place] - [address] [place] [address] Phone: [phone] Fax: [phone]   Patient reports affordability concerns with their medications: No  Patient reports access/transportation concerns to their pharmacy: No  Patient reports adherence concerns with their medications:  No     Tobacco Abuse:  Tobacco Use History: Age when started using tobacco on a daily basis 11yr Number of cigarettes per day, was 1ppd, was at 3 cigarettes/day for our previous call. Still 3/day, trying to restrict after eating.  Smokes first cigarette 30 minutes after waking Does not wake at night to smoke Triggers include psychological: after meals, car  Quit Attempt History: Most recent quit attempt 62yr old, during pregnancy of her son Longest time ever been tobacco free: through  Methods tried in the past include Nicotine Patch.   Motivators to quitting include her health & wanting to avoid oxygen therapy; barriers include  habit-based    Current medication access support: utilized the 1-800 quit line. Has  and  patches. Currently on the  patch. Was previously on them through July-October. Started smoking again in December in response to stress with daughter in law, as well as back pain.   Has tried lozenges in past - . The  were way too strong.   Objective:  Lab Results  Component Value Date   HGBA1C 4.6 01/22/2021    Lab Results  Component Value Date   CREATININE 0.65 06/11/2022   BUN 11 06/11/2022    NA 143 06/11/2022   K 2.9 (L) 06/11/2022   CL 108 06/11/2022   CO2 26 06/11/2022    Lab Results  Component Value Date   CHOL 125 02/05/2022   HDL 60 02/05/2022   LDLCALC 51 02/05/2022   TRIG 48 02/05/2022   CHOLHDL 2.1 02/05/2022    Medications Reviewed Today     Reviewed by Gabriel Carina, RPH (Pharmacist) on 12/27/22 at 0925  Med List Status: <None>   Medication Order Taking? Sig Documenting Provider Last Dose Status Informant  AMBULATORY NON FORMULARY MEDICATION [id number] No Tens unit for lumbar spine for low back pain, muscle spasm, lumbar DDD. Jomarie Longs, PA-C Taking Active   amLODipine (NORVASC) 10 MG tablet [id number] No Take 1 tablet (10 mg total) by mouth daily. Jomarie Longs, PA-C Taking Active   atorvastatin (LIPITOR) 40 MG tablet [id number] No Take 1 tablet (40 mg total) by mouth daily. Nolene Ebbs Taking Active   BELBUCA 450 MCG FILM [id number] No Place 450 mcg inside cheek daily. [provider] Taking Active   CALCIUM-MAGNESIUM-ZINC PO [id number] No Take by mouth daily. [provider] Taking Active   cholecalciferol (VITAMIN D3) 25 MCG (1000 UT) tablet [id number] No Take 1,000 Units by mouth daily. [provider] Taking Active   clonazePAM (KLONOPIN) 0.5 MG tablet [id number]  Take one tablet as needed for panic attacks. Thresa Ross, MD  Active   cyclobenzaprine (FLEXERIL) 10 MG tablet [id number] No Take 1 tablet (10 mg total) by mouth 3 (three)  times daily as needed for muscle spasms.  Patient not taking: Reported on 12/26/2022   Jomarie Longs, [place] Not Taking Active   diclofenac sodium (VOLTAREN) 1 % GEL [id number] No Apply 4 g topically 4 (four) times daily. To affected joint.  Patient not taking: Reported on 11/26/2022   Rodolph Bong, MD Not Taking Active   famotidine (PEPCID) 20 MG tablet [id number] No Take 1 tablet (20 mg total) by mouth 2 (two) times daily. Suzan Slick, MD Taking Active            Med Note  Wilder Glade   Tue Nov 26, 2022  9:06 AM) Leanora Ivanoff as needed  fluticasone [facility]) 50 MCG/ACT nasal spray [id number] No Place 2 sprays into both nostrils daily.  Patient not taking: Reported on 11/26/2022   Suzan Slick, MD Not Taking Active   gabapentin (NEURONTIN) 300 MG capsule [id number] No TAKE 1 CAPSULE BY MOUTH 3 TIMES A DAY AS NEEDED FOR Aimee Halsted, MD Taking Active            Med Note Jenne Campus Nov 11, 2022  2:12 PM) Managed by pain doctor  hydrochlorothiazide (HYDRODIURIL) 50 MG tablet [id number] No Take 1 tablet (50 mg total) by mouth daily.  Patient taking differently: Take 25 mg by mouth daily.   Jomarie Longs, PA-C Taking Active            Med Note Jenne Campus Nov 11, 2022  2:13 PM) Taking half tablets as of 11/11/22 review  INCRUSE ELLIPTA 62.5 MCG/ACT AEPB [id number] No Inhale 1 puff into the lungs daily. Jomarie Longs, PA-C Taking Active   levothyroxine (SYNTHROID) 150 MCG tablet [id number] No Take 1 tab ( ) by mouth Mon, Fri. Take 1.5 tabs ( ) Sun, Tues, Wed, Thurs & Sat. Take on empty stomach at least 30 minutes prior to food or other medications. Jomarie Longs, PA-C Taking Active   LINZESS 72 MCG capsule [id number] No Take 72 mcg by mouth every morning.  Patient not taking: Reported on 11/11/2022   [provider] Not Taking Active   metoCLOPramide (REGLAN) 10 MG tablet [id number] No TAKE ONE TABLET BY MOUTH FOUR TIMES DAILY - BEFORE meals AND AT BEDTIME Breeback, [name] L, PA-C Taking Active   nabumetone (RELAFEN) 500 MG tablet [id number] No Take 500 mg by mouth 2 (two) times daily as needed for moderate pain. [provider] Taking Active   Omega 3-6-9 Fatty Acids (OMEGA 3-6-9 COMPLEX PO) [id number] No Take 1 capsule by mouth daily.  [provider] Taking Active   omeprazole (PRILOSEC) 40 MG capsule [id number] No Take 1 capsule (40 mg total) by mouth in the morning and at bedtime. Breeback,  [name] L, PA-C Taking Active   PAIN RELIEF MAXIMUM STRENGTH 4 % [id number] No Place 1 patch onto the skin daily.  Patient not taking: Reported on 11/11/2022   Jomarie Longs, PA-C Not Taking Active   rizatriptan (MAXALT-MLT) 10 MG disintegrating tablet [id number] No May repeat in 2 hours if needed for migraine Breeback, [name] L, PA-C Taking Active   rOPINIRole (REQUIP) 1 MG tablet [id number] No Take 1 tablet (1 mg total) by mouth 3 (three) times daily.  Patient not taking: Reported on 12/26/2022   Jomarie Longs, PA-C Not Taking Active   topiramate (TOPAMAX) 100 MG tablet [id number] No Take one tablet twice a day. Jomarie Longs, PA-C Taking Active   traZODone (  DESYREL) 50 MG tablet [id number] No Take 3 tablets (150 mg total) by mouth at bedtime as needed for sleep. Jomarie Longs, PA-C Taking Active   valACYclovir (VALTREX) 1000 MG tablet [id number] No TAKE TWO TABLETS BY MOUTH 2 TIMES A DAY AT ONSET OF COLD SORE FOR 2 DAYS  Patient not taking: Reported on 11/26/2022   Jomarie Longs, PA-C Not Taking Active   Vilazodone HCl (VIIBRYD) 40 MG TABS [id number] No Take 1 tablet (40 mg total) by mouth daily. Jomarie Longs, PA-C Taking Active               Assessment/Plan:   Tobacco Abuse - Currently uncontrolled, but patient made successful progress between calls - she contacted quit line for more patches, is using a drinking straw for mouth habit during driving. She is in significant back pain which is priority right now. - Provided motivational interviewing to assess tobacco use and strategies for reduction - Provided information on 1 800 QUIT NOW support program - Patient is already utilizing patch via quit line, provided counseling on available options and efficacy for dual nicotine replacement therapy. She is amenable to using lozenges. - Her next goal is going one whole day without smoking. She wants a call next week to discuss this attempt.   Follow Up Plan: 1.5 weeks   Lynnda Shields, PharmD Clinical Pharmacist [facility] [phone]

## 2023-01-17 ENCOUNTER — Encounter: Payer: Self-pay | Admitting: Physician Assistant

## 2023-01-17 DIAGNOSIS — E039 Hypothyroidism, unspecified: Secondary | ICD-10-CM

## 2023-01-17 MED ORDER — LEVOTHYROXINE SODIUM 150 MCG PO TABS
ORAL_TABLET | ORAL | 0 refills | Status: DC
Start: 2023-01-17 — End: 2023-01-24

## 2023-01-22 ENCOUNTER — Other Ambulatory Visit: Payer: Medicare Other | Admitting: Pharmacist

## 2023-01-22 ENCOUNTER — Telehealth: Payer: Self-pay | Admitting: Pharmacist

## 2023-01-22 NOTE — Progress Notes (Signed)
Attempted to contact patient x2 for scheduled appointment for medication management. Left HIPAA compliant message for patient to return my call at their convenience.   Lynnda Shields, PharmD, BCPS Clinical Pharmacist [facility]

## 2023-01-24 ENCOUNTER — Encounter: Payer: Self-pay | Admitting: Physician Assistant

## 2023-01-24 ENCOUNTER — Ambulatory Visit (INDEPENDENT_AMBULATORY_CARE_PROVIDER_SITE_OTHER): Payer: Medicare Other | Admitting: Physician Assistant

## 2023-01-24 VITALS — BP 107/69 | HR 95 | Ht 67.5 in | Wt 206.8 lb

## 2023-01-24 DIAGNOSIS — F332 Major depressive disorder, recurrent severe without psychotic features: Secondary | ICD-10-CM

## 2023-01-24 DIAGNOSIS — E6609 Other obesity due to excess calories: Secondary | ICD-10-CM

## 2023-01-24 DIAGNOSIS — F419 Anxiety disorder, unspecified: Secondary | ICD-10-CM

## 2023-01-24 DIAGNOSIS — R635 Abnormal weight gain: Secondary | ICD-10-CM | POA: Diagnosis not present

## 2023-01-24 DIAGNOSIS — I10 Essential (primary) hypertension: Secondary | ICD-10-CM | POA: Diagnosis not present

## 2023-01-24 DIAGNOSIS — E039 Hypothyroidism, unspecified: Secondary | ICD-10-CM

## 2023-01-24 DIAGNOSIS — G894 Chronic pain syndrome: Secondary | ICD-10-CM

## 2023-01-24 DIAGNOSIS — Z13820 Encounter for screening for osteoporosis: Secondary | ICD-10-CM

## 2023-01-24 MED ORDER — LEVOTHYROXINE SODIUM 150 MCG PO TABS
ORAL_TABLET | ORAL | 1 refills | Status: DC
Start: 2023-01-24 — End: 2023-08-25

## 2023-01-24 MED ORDER — HYDROCHLOROTHIAZIDE 50 MG PO TABS
25.0000 mg | ORAL_TABLET | Freq: Every day | ORAL | 1 refills | Status: DC
Start: 2023-01-24 — End: 2023-04-30

## 2023-01-24 MED ORDER — VILAZODONE HCL 40 MG PO TABS: 40.0000 mg | ORAL_TABLET | Freq: Every day | ORAL | 1 refills | Status: DC

## 2023-01-24 NOTE — Progress Notes (Signed)
Established Patient Office Visit  Subjective   Patient ID: Aimee Little, female    DOB: 11-06-1960  Age: 62 y.o. MRN: [id number]  Chief Complaint  Patient presents with   Thyroid Problem   Weight Gain    HPI Pt is a 62 yo obese female with hypothyroidism, chronic pain, MDD who presents to the clinic for follow up.   Her TSH was recently checked and .450. She has gained about 6lbs and concerning to her. She feels better and more active but gaining weight.   Her pain is controlled today.    Active Ambulatory Problems    Diagnosis Date Noted   HOT FLASHES 12/29/2009   DISC DISEASE, LUMBAR 12/29/2009   Fibromyalgia 01/01/2013   Unspecified vitamin D deficiency 05/16/2013   Migraine without status migrainosus, not intractable 07/21/2013   Chronic pain syndrome 11/05/2013   Anxiety 12/17/2013   Adenomatous colon polyp 12/20/2013   Class 1 obesity due to excess calories without serious comorbidity with body mass index (BMI) of 31.0 to 31.9 in adult 11/25/2014   Sinobronchitis 04/13/2015   Chronic fatigue 05/26/2015   RLS (restless legs syndrome) 09/22/2015   Hypothyroid 10/27/2015   Insomnia 11/21/2015   Severe episode of recurrent major depressive disorder, without psychotic features (HCC) 11/29/2015   Rhinitis, allergic 11/29/2015   Vaginal atrophy 01/02/2016   Generalized anxiety disorder 03/19/2016   Panic attacks 03/19/2016   Loose body in knee 04/17/2016   Chondromalacia of patellofemoral joint 04/17/2016   Diverticulitis of colon without hemorrhage 07/19/2016   Diverticulitis 09/10/2017   Light tobacco smoker 10/01/2017   Essential hypertension 10/01/2017   DDD (degenerative disc disease), lumbar 04/01/2018   OSA on CPAP 05/29/2018   Chronic bilateral thoracic back pain 08/04/2018   Osteopenia 03/24/2019   Chronic bilateral low back pain with bilateral sciatica 12/13/2019   DDD (degenerative disc disease), thoracic 01/04/2020   Mixed hyperlipidemia 01/20/2020    Bilateral lower extremity edema 09/01/2020   Abnormal ECG 11/20/2020   Hypertension goal BP (blood pressure) < 130/80 02/28/2021   Bruised rib 06/05/2021   Aortic atherosclerosis (HCC) 06/22/2021   Coronary artery calcification seen on CT scan 06/22/2021   Emphysema lung (HCC) 06/22/2021   Pulmonary nodules 06/22/2021   Chronic constipation 09/04/2021   Chronic bilateral low back pain without sciatica 02/04/2022   Positive colorectal cancer screening using Cologuard test 03/15/2022   Anxiety and depression 03/21/2022   Hypokalemia 03/21/2022   Petechiae 04/22/2022   Elevated hemoglobin (HCC) 05/31/2022   Chills (without fever) 05/31/2022   No energy 05/31/2022   Adjustment disorder with mixed anxiety and depressed mood 10/18/2022   Abnormal weight gain 10/18/2022   Resolved Ambulatory Problems    Diagnosis Date Noted   Unspecified hypothyroidism 12/29/2009   TOBACCO ABUSE 06/13/2010   GRIEF REACTION, ACUTE 12/29/2009   Depression 02/06/2010   Osteoarthrosis involving lower leg 12/29/2009   NAUSEA AND VOMITING 05/02/2010   Osteoarthritis of left knee 07/21/2013   Medication management 11/05/2013   Nausea with vomiting 04/07/2014   Lumbago 11/25/2014   Sacroiliac joint dysfunction of both sides 09/06/2015   Bilateral low back pain without sciatica 09/06/2015   Right knee pain 11/07/2015   Left knee pain 01/23/2016   Abdominal pain, left lower quadrant 04/25/2016   Trochanteric bursitis of left hip 01/29/2017   Nonintractable headache 03/06/2017   Tachycardia 03/06/2017   Rib pain on left side 08/03/2017   Elevated blood pressure reading 10/01/2017   Rib pain on right side 11/04/2017  Traumatic hematoma of eyelid, right, initial encounter 11/04/2017   Low grade fever 04/01/2018   Acute bilateral low back pain with bilateral sciatica 04/01/2018   Closed fracture of one rib of right side 12/01/2018   Upper back pain 11/30/2019   Mid back pain 12/13/2019   Muscle spasm  of back 12/13/2019   Chest pain 09/01/2020   Epigastric pain 09/01/2020   Past Medical History:  Diagnosis Date   Migraine    Thyroid disease    Tobacco use     ROS See HPI.    Objective:     BP 107/69 (BP Location: Left Arm, Patient Position: Sitting, Cuff Size: Large)   Pulse 95   Ht 5' 7.5" (1.715 m)   Wt 206 lb 12.8 oz (93.8 kg)   SpO2 98%   BMI 31.91 kg/m  BP Readings from Last 3 Encounters:  01/24/23 107/69  10/22/22 (!) 90/56  10/16/22 (!) 123/50   Wt Readings from Last 3 Encounters:  01/24/23 206 lb 12.8 oz (93.8 kg)  10/22/22 200 lb 11.2 oz (91 kg)  10/16/22 210 lb (95.3 kg)    ..    01/24/2023    1:17 PM 11/26/2022    9:06 AM 11/26/2022    9:03 AM 10/29/2022   10:44 AM 10/16/2022    3:09 PM  Depression screen PHQ 2/9  Decreased Interest 0    1  Down, Depressed, Hopeless 1    3  PHQ - 2 Score 1    4  Altered sleeping     3  Tired, decreased energy     1  Change in appetite     3  Feeling bad or failure about yourself      3  Trouble concentrating     3  Moving slowly or fidgety/restless     3  Suicidal thoughts     3  PHQ-9 Score     23  Difficult doing work/chores     Very difficult     Information is confidential and restricted. Go to Review Flowsheets to unlock data.     Physical Exam Vitals reviewed.  Constitutional:      Appearance: Normal appearance.  HENT:     Head: Normocephalic.  Neck:     Vascular: No carotid bruit.  Cardiovascular:     Rate and Rhythm: Normal rate and regular rhythm.     Pulses: Normal pulses.  Pulmonary:     Effort: Pulmonary effort is normal.     Breath sounds: Normal breath sounds.  Musculoskeletal:     Cervical back: Normal range of motion and neck supple. No rigidity or tenderness.     Right lower leg: No edema.     Left lower leg: No edema.  Lymphadenopathy:     Cervical: No cervical adenopathy.  Neurological:     General: No focal deficit present.     Mental Status: She is alert and oriented to  person, place, and time.  Psychiatric:        Mood and Affect: Mood normal.         Assessment & Plan:  Marland KitchenMarland KitchenMelissa was seen today for thyroid problem and weight gain.  Diagnoses and all orders for this visit:  Hypothyroidism, unspecified type -     levothyroxine (SYNTHROID) 150 MCG tablet; Take one and one-half tablet daily  Anxiety and depression  Screening for osteoporosis -     DG Bone Density; Future  Abnormal weight gain  Severe episode of recurrent  major depressive disorder, without psychotic features (HCC) -     Vilazodone HCl (VIIBRYD) 40 MG TABS; Take 1 tablet (40 mg total) by mouth daily.  Hypertension goal BP (blood pressure) < 130/80 -     hydrochlorothiazide (HYDRODIURIL) 50 MG tablet; Take 0.5 tablets (25 mg total) by mouth daily.  Chronic pain syndrome  Class 1 obesity due to excess calories without serious comorbidity with body mass index (BMI) of 31.0 to 31.9 in adult   Mood improved- continue vibryd Pain controlled improved continue management by pain clinic Vitals to goal-refilled HcTZ Discussed TSH- continue same dose Discussed IF 16:8 and regular exercise Bone density ordered Follow up in 6 months    Tandy Gaw, PA-C

## 2023-01-29 ENCOUNTER — Other Ambulatory Visit: Payer: Medicare Other | Admitting: Pharmacist

## 2023-01-29 ENCOUNTER — Telehealth: Payer: Self-pay | Admitting: Pharmacist

## 2023-01-29 ENCOUNTER — Telehealth: Payer: Self-pay | Admitting: Physician Assistant

## 2023-01-29 NOTE — Progress Notes (Signed)
Attempted to contact patient x2 for scheduled appointment for medication management. Left HIPAA compliant message for patient to return my call at their convenience.   Daniya Aramburo, PharmD, BCPS Clinical Pharmacist [facility]  

## 2023-01-29 NOTE — Telephone Encounter (Signed)
Called patient to schedule Medicare Annual Wellness Visit (AWV). Left message for patient to call back and schedule Medicare Annual Wellness Visit (AWV).  Last date of AWV: 2022  Please schedule an appointment at any time with [facility].  If any questions, please contact me at [phone].  Thank you ,  Morgan Jessup Patient Access Advocate II Direct Dial: [phone]   

## 2023-01-31 ENCOUNTER — Other Ambulatory Visit: Payer: Self-pay | Admitting: Physician Assistant

## 2023-01-31 DIAGNOSIS — F32A Depression, unspecified: Secondary | ICD-10-CM

## 2023-02-05 ENCOUNTER — Other Ambulatory Visit: Payer: Self-pay | Admitting: Physician Assistant

## 2023-02-05 DIAGNOSIS — I10 Essential (primary) hypertension: Secondary | ICD-10-CM

## 2023-02-14 DIAGNOSIS — Z79899 Other long term (current) drug therapy: Secondary | ICD-10-CM | POA: Diagnosis not present

## 2023-02-14 DIAGNOSIS — M545 Low back pain, unspecified: Secondary | ICD-10-CM | POA: Diagnosis not present

## 2023-02-14 DIAGNOSIS — M546 Pain in thoracic spine: Secondary | ICD-10-CM | POA: Diagnosis not present

## 2023-02-14 DIAGNOSIS — G8929 Other chronic pain: Secondary | ICD-10-CM | POA: Diagnosis not present

## 2023-02-14 DIAGNOSIS — M47814 Spondylosis without myelopathy or radiculopathy, thoracic region: Secondary | ICD-10-CM | POA: Diagnosis not present

## 2023-02-14 DIAGNOSIS — Z6831 Body mass index (BMI) 31.0-31.9, adult: Secondary | ICD-10-CM | POA: Diagnosis not present

## 2023-02-14 DIAGNOSIS — I10 Essential (primary) hypertension: Secondary | ICD-10-CM | POA: Diagnosis not present

## 2023-02-14 DIAGNOSIS — E6609 Other obesity due to excess calories: Secondary | ICD-10-CM | POA: Diagnosis not present

## 2023-02-27 ENCOUNTER — Ambulatory Visit (HOSPITAL_COMMUNITY): Payer: Medicare Other | Admitting: Psychiatry

## 2023-03-07 ENCOUNTER — Encounter: Payer: Self-pay | Admitting: Physician Assistant

## 2023-03-07 ENCOUNTER — Ambulatory Visit (INDEPENDENT_AMBULATORY_CARE_PROVIDER_SITE_OTHER): Payer: Medicare Other | Admitting: Physician Assistant

## 2023-03-07 VITALS — BP 118/53 | HR 87 | Ht 67.5 in | Wt 207.0 lb

## 2023-03-07 DIAGNOSIS — G2581 Restless legs syndrome: Secondary | ICD-10-CM | POA: Diagnosis not present

## 2023-03-07 DIAGNOSIS — F419 Anxiety disorder, unspecified: Secondary | ICD-10-CM

## 2023-03-07 DIAGNOSIS — F410 Panic disorder [episodic paroxysmal anxiety] without agoraphobia: Secondary | ICD-10-CM

## 2023-03-07 DIAGNOSIS — F332 Major depressive disorder, recurrent severe without psychotic features: Secondary | ICD-10-CM | POA: Diagnosis not present

## 2023-03-07 MED ORDER — CARIPRAZINE HCL 1.5 MG PO CAPS
1.5000 mg | ORAL_CAPSULE | Freq: Every day | ORAL | 1 refills | Status: DC
Start: 2023-03-07 — End: 2023-04-30

## 2023-03-07 MED ORDER — ROPINIROLE HCL 0.25 MG PO TABS
ORAL_TABLET | ORAL | 2 refills | Status: DC
Start: 2023-03-07 — End: 2023-04-30

## 2023-03-07 MED ORDER — CLONAZEPAM 0.5 MG PO TABS
ORAL_TABLET | ORAL | 0 refills | Status: DC
Start: 2023-03-07 — End: 2023-04-30

## 2023-03-07 NOTE — Progress Notes (Signed)
Acute Office Visit  Subjective:     Patient ID: Aimee Little, female    DOB: 1961-09-08, 62 y.o.   MRN: [id number]  Chief Complaint  Patient presents with   mood    HPI Patient is in today for worsening for the last 3 weeks. She denies any situation or medication change. She has no energy to do anything. She does not want to take a shower or even leave the house. She denies any feelings of self harm. She has been on viibryd for at least 3 years and done really well. She is using more klonapin due to panic and anxiety feeling. RLS doing ok but would like refills.   .. Active Ambulatory Problems    Diagnosis Date Noted   HOT FLASHES 12/29/2009   DISC DISEASE, LUMBAR 12/29/2009   Fibromyalgia 01/01/2013   Unspecified vitamin D deficiency 05/16/2013   Migraine without status migrainosus, not intractable 07/21/2013   Chronic pain syndrome 11/05/2013   Anxiety 12/17/2013   Adenomatous colon polyp 12/20/2013   Class 1 obesity due to excess calories without serious comorbidity with body mass index (BMI) of 31.0 to 31.9 in adult 11/25/2014   Sinobronchitis 04/13/2015   Chronic fatigue 05/26/2015   RLS (restless legs syndrome) 09/22/2015   Hypothyroid 10/27/2015   Insomnia 11/21/2015   Severe episode of recurrent major depressive disorder, without psychotic features (HCC) 11/29/2015   Rhinitis, allergic 11/29/2015   Vaginal atrophy 01/02/2016   Generalized anxiety disorder 03/19/2016   Panic attacks 03/19/2016   Loose body in knee 04/17/2016   Chondromalacia of patellofemoral joint 04/17/2016   Diverticulitis of colon without hemorrhage 07/19/2016   Diverticulitis 09/10/2017   Light tobacco smoker 10/01/2017   Essential hypertension 10/01/2017   DDD (degenerative disc disease), lumbar 04/01/2018   OSA on CPAP 05/29/2018   Chronic bilateral thoracic back pain 08/04/2018   Osteopenia 03/24/2019   Chronic bilateral low back pain with bilateral sciatica 12/13/2019   DDD  (degenerative disc disease), thoracic 01/04/2020   Mixed hyperlipidemia 01/20/2020   Bilateral lower extremity edema 09/01/2020   Abnormal ECG 11/20/2020   Hypertension goal BP (blood pressure) < 130/80 02/28/2021   Bruised rib 06/05/2021   Aortic atherosclerosis (HCC) 06/22/2021   Coronary artery calcification seen on CT scan 06/22/2021   Emphysema lung (HCC) 06/22/2021   Pulmonary nodules 06/22/2021   Chronic constipation 09/04/2021   Chronic bilateral low back pain without sciatica 02/04/2022   Positive colorectal cancer screening using Cologuard test 03/15/2022   Anxiety and depression 03/21/2022   Hypokalemia 03/21/2022   Petechiae 04/22/2022   Elevated hemoglobin (HCC) 05/31/2022   Chills (without fever) 05/31/2022   No energy 05/31/2022   Adjustment disorder with mixed anxiety and depressed mood 10/18/2022   Abnormal weight gain 10/18/2022   Resolved Ambulatory Problems    Diagnosis Date Noted   Unspecified hypothyroidism 12/29/2009   TOBACCO ABUSE 06/13/2010   GRIEF REACTION, ACUTE 12/29/2009   Depression 02/06/2010   Osteoarthrosis involving lower leg 12/29/2009   NAUSEA AND VOMITING 05/02/2010   Osteoarthritis of left knee 07/21/2013   Medication management 11/05/2013   Nausea with vomiting 04/07/2014   Lumbago 11/25/2014   Sacroiliac joint dysfunction of both sides 09/06/2015   Bilateral low back pain without sciatica 09/06/2015   Right knee pain 11/07/2015   Left knee pain 01/23/2016   Abdominal pain, left lower quadrant 04/25/2016   Trochanteric bursitis of left hip 01/29/2017   Nonintractable headache 03/06/2017   Tachycardia 03/06/2017   Rib pain  on left side 08/03/2017   Elevated blood pressure reading 10/01/2017   Rib pain on right side 11/04/2017   Traumatic hematoma of eyelid, right, initial encounter 11/04/2017   Low grade fever 04/01/2018   Acute bilateral low back pain with bilateral sciatica 04/01/2018   Closed fracture of one rib of right side  12/01/2018   Upper back pain 11/30/2019   Mid back pain 12/13/2019   Muscle spasm of back 12/13/2019   Chest pain 09/01/2020   Epigastric pain 09/01/2020   Past Medical History:  Diagnosis Date   Migraine    Thyroid disease    Tobacco use      ROS  See HPI.     Objective:    BP (!) 118/53   Pulse 87   Ht 5' 7.5" (1.715 m)   Wt 207 lb (93.9 kg)   SpO2 96%   BMI 31.94 kg/m  BP Readings from Last 3 Encounters:  03/07/23 (!) 118/53  01/24/23 107/69  10/22/22 (!) 90/56   Wt Readings from Last 3 Encounters:  03/07/23 207 lb (93.9 kg)  01/24/23 206 lb 12.8 oz (93.8 kg)  10/22/22 200 lb 11.2 oz (91 kg)    ..    03/07/2023   11:52 AM 01/24/2023    1:17 PM 11/26/2022    9:06 AM 11/26/2022    9:03 AM 10/29/2022   10:44 AM  Depression screen PHQ 2/9  Decreased Interest 2 0     Down, Depressed, Hopeless 3 1     PHQ - 2 Score 5 1     Altered sleeping 3      Tired, decreased energy 3      Change in appetite 2      Feeling bad or failure about yourself  1      Trouble concentrating 1      Moving slowly or fidgety/restless 2      Suicidal thoughts 0      PHQ-9 Score 17      Difficult doing work/chores Very difficult         Information is confidential and restricted. Go to Review Flowsheets to unlock data.   ..    03/07/2023   11:52 AM 10/16/2022    3:09 PM 05/29/2022    2:20 PM 03/21/2022    1:40 PM  GAD 7 : Generalized Anxiety Score  Nervous, Anxious, on Edge 2 3 1 2   Control/stop worrying 2 3 2 2   Worry too much - different things 2 3 2 3   Trouble relaxing 1 3 1 2   Restless 0 3 1 2   Easily annoyed or irritable 0 1 0 1  Afraid - awful might happen 0 3 1 1   Total GAD 7 Score 7 19 8 13   Anxiety Difficulty Very difficult Very difficult Somewhat difficult Very difficult      Physical Exam HENT:     Head: Normocephalic.  Cardiovascular:     Rate and Rhythm: Normal rate.  Pulmonary:     Effort: Pulmonary effort is normal.  Musculoskeletal:     Right lower  leg: No edema.     Left lower leg: No edema.  Neurological:     General: No focal deficit present.  Psychiatric:     Comments: Flat affect         Assessment & Plan:  Marland KitchenMarland KitchenMelissa was seen today for mood.  Diagnoses and all orders for this visit:  Severe episode of recurrent major depressive disorder, without psychotic features (HCC) -  cariprazine (VRAYLAR) 1.5 MG capsule; Take 1 capsule (1.5 mg total) by mouth daily.  Panic attacks -     clonazePAM (KLONOPIN) 0.5 MG tablet; Take one tablet as needed for panic attacks.  RLS (restless legs syndrome) -     rOPINIRole (REQUIP) 0.25 MG tablet; One tablet before bed may increase by 1 tablet every 4 days until maximum 4 tablets  Anxiety and depression   PHQ worsened and not to goal Continue viibryd and start vraylar Discussed SE  Refilled klonapin for as needed usage Refilled requip Discussed coping skills Follow up in 1 month  Tandy Gaw, PA-C

## 2023-03-10 ENCOUNTER — Encounter: Payer: Self-pay | Admitting: Physician Assistant

## 2023-03-18 ENCOUNTER — Encounter: Payer: Self-pay | Admitting: Physician Assistant

## 2023-03-20 ENCOUNTER — Encounter: Payer: Self-pay | Admitting: Physician Assistant

## 2023-03-26 DIAGNOSIS — M546 Pain in thoracic spine: Secondary | ICD-10-CM | POA: Diagnosis not present

## 2023-03-26 DIAGNOSIS — Z6831 Body mass index (BMI) 31.0-31.9, adult: Secondary | ICD-10-CM | POA: Diagnosis not present

## 2023-03-26 DIAGNOSIS — Z79899 Other long term (current) drug therapy: Secondary | ICD-10-CM | POA: Diagnosis not present

## 2023-03-26 DIAGNOSIS — F17210 Nicotine dependence, cigarettes, uncomplicated: Secondary | ICD-10-CM | POA: Diagnosis not present

## 2023-03-26 DIAGNOSIS — M47814 Spondylosis without myelopathy or radiculopathy, thoracic region: Secondary | ICD-10-CM | POA: Diagnosis not present

## 2023-03-26 DIAGNOSIS — G8929 Other chronic pain: Secondary | ICD-10-CM | POA: Diagnosis not present

## 2023-03-26 DIAGNOSIS — M545 Low back pain, unspecified: Secondary | ICD-10-CM | POA: Diagnosis not present

## 2023-03-26 DIAGNOSIS — M1712 Unilateral primary osteoarthritis, left knee: Secondary | ICD-10-CM | POA: Diagnosis not present

## 2023-03-26 DIAGNOSIS — E6609 Other obesity due to excess calories: Secondary | ICD-10-CM | POA: Diagnosis not present

## 2023-03-26 DIAGNOSIS — R7401 Elevation of levels of liver transaminase levels: Secondary | ICD-10-CM | POA: Diagnosis not present

## 2023-03-26 DIAGNOSIS — I10 Essential (primary) hypertension: Secondary | ICD-10-CM | POA: Diagnosis not present

## 2023-04-11 DIAGNOSIS — M546 Pain in thoracic spine: Secondary | ICD-10-CM | POA: Diagnosis not present

## 2023-04-11 DIAGNOSIS — F17210 Nicotine dependence, cigarettes, uncomplicated: Secondary | ICD-10-CM | POA: Diagnosis not present

## 2023-04-11 DIAGNOSIS — Z6831 Body mass index (BMI) 31.0-31.9, adult: Secondary | ICD-10-CM | POA: Diagnosis not present

## 2023-04-11 DIAGNOSIS — G8929 Other chronic pain: Secondary | ICD-10-CM | POA: Diagnosis not present

## 2023-04-11 DIAGNOSIS — E6609 Other obesity due to excess calories: Secondary | ICD-10-CM | POA: Diagnosis not present

## 2023-04-11 DIAGNOSIS — M545 Low back pain, unspecified: Secondary | ICD-10-CM | POA: Diagnosis not present

## 2023-04-11 DIAGNOSIS — M47814 Spondylosis without myelopathy or radiculopathy, thoracic region: Secondary | ICD-10-CM | POA: Diagnosis not present

## 2023-04-11 DIAGNOSIS — I10 Essential (primary) hypertension: Secondary | ICD-10-CM | POA: Diagnosis not present

## 2023-04-16 ENCOUNTER — Other Ambulatory Visit: Payer: Self-pay | Admitting: Physician Assistant

## 2023-04-16 DIAGNOSIS — F5105 Insomnia due to other mental disorder: Secondary | ICD-10-CM

## 2023-04-25 ENCOUNTER — Other Ambulatory Visit: Payer: Self-pay | Admitting: Physician Assistant

## 2023-04-25 DIAGNOSIS — F410 Panic disorder [episodic paroxysmal anxiety] without agoraphobia: Secondary | ICD-10-CM

## 2023-04-29 NOTE — Telephone Encounter (Signed)
Last OV: 03/07/23 Next OV: 04/30/23 Last RF: 03/07/23

## 2023-04-30 ENCOUNTER — Encounter: Payer: Self-pay | Admitting: Physician Assistant

## 2023-04-30 ENCOUNTER — Ambulatory Visit (INDEPENDENT_AMBULATORY_CARE_PROVIDER_SITE_OTHER): Payer: Medicare Other | Admitting: Physician Assistant

## 2023-04-30 VITALS — BP 96/82 | HR 92 | Ht 68.0 in | Wt 216.1 lb

## 2023-04-30 DIAGNOSIS — F410 Panic disorder [episodic paroxysmal anxiety] without agoraphobia: Secondary | ICD-10-CM

## 2023-04-30 DIAGNOSIS — G43909 Migraine, unspecified, not intractable, without status migrainosus: Secondary | ICD-10-CM

## 2023-04-30 DIAGNOSIS — I10 Essential (primary) hypertension: Secondary | ICD-10-CM

## 2023-04-30 DIAGNOSIS — F17200 Nicotine dependence, unspecified, uncomplicated: Secondary | ICD-10-CM

## 2023-04-30 DIAGNOSIS — Z0000 Encounter for general adult medical examination without abnormal findings: Secondary | ICD-10-CM | POA: Diagnosis not present

## 2023-04-30 DIAGNOSIS — E782 Mixed hyperlipidemia: Secondary | ICD-10-CM

## 2023-04-30 DIAGNOSIS — G2581 Restless legs syndrome: Secondary | ICD-10-CM | POA: Diagnosis not present

## 2023-04-30 DIAGNOSIS — M5416 Radiculopathy, lumbar region: Secondary | ICD-10-CM | POA: Diagnosis not present

## 2023-04-30 DIAGNOSIS — R1013 Epigastric pain: Secondary | ICD-10-CM

## 2023-04-30 DIAGNOSIS — E039 Hypothyroidism, unspecified: Secondary | ICD-10-CM

## 2023-04-30 DIAGNOSIS — F5105 Insomnia due to other mental disorder: Secondary | ICD-10-CM

## 2023-04-30 DIAGNOSIS — F332 Major depressive disorder, recurrent severe without psychotic features: Secondary | ICD-10-CM

## 2023-04-30 DIAGNOSIS — R110 Nausea: Secondary | ICD-10-CM

## 2023-04-30 DIAGNOSIS — E559 Vitamin D deficiency, unspecified: Secondary | ICD-10-CM

## 2023-04-30 DIAGNOSIS — F99 Mental disorder, not otherwise specified: Secondary | ICD-10-CM

## 2023-04-30 MED ORDER — OMEPRAZOLE 40 MG PO CPDR: 40.0000 mg | DELAYED_RELEASE_CAPSULE | Freq: Two times a day (BID) | ORAL | 3 refills | Status: DC

## 2023-04-30 MED ORDER — TOPIRAMATE 100 MG PO TABS: ORAL_TABLET | ORAL | 1 refills | Status: DC

## 2023-04-30 MED ORDER — ROPINIROLE HCL 1 MG PO TABS
1.0000 mg | ORAL_TABLET | Freq: Every day | ORAL | 1 refills | Status: DC
Start: 2023-04-30 — End: 2024-01-27

## 2023-04-30 MED ORDER — AMLODIPINE BESYLATE 10 MG PO TABS
10.0000 mg | ORAL_TABLET | Freq: Every day | ORAL | 1 refills | Status: DC
Start: 2023-04-30 — End: 2023-10-30

## 2023-04-30 MED ORDER — ATORVASTATIN CALCIUM 40 MG PO TABS
40.0000 mg | ORAL_TABLET | Freq: Every day | ORAL | 3 refills | Status: DC
Start: 2023-04-30 — End: 2024-04-26

## 2023-04-30 MED ORDER — CARIPRAZINE HCL 1.5 MG PO CAPS
1.5000 mg | ORAL_CAPSULE | Freq: Every day | ORAL | 1 refills | Status: DC
Start: 2023-04-30 — End: 2023-06-25

## 2023-04-30 MED ORDER — ONDANSETRON 8 MG PO TBDP
8.0000 mg | ORAL_TABLET | Freq: Three times a day (TID) | ORAL | 1 refills | Status: DC | PRN
Start: 2023-04-30 — End: 2023-06-25

## 2023-04-30 MED ORDER — TRAZODONE HCL 50 MG PO TABS
150.0000 mg | ORAL_TABLET | Freq: Every evening | ORAL | 1 refills | Status: DC | PRN
Start: 2023-04-30 — End: 2023-06-25

## 2023-04-30 MED ORDER — CLONAZEPAM 0.5 MG PO TABS: ORAL_TABLET | ORAL | 2 refills | Status: DC

## 2023-04-30 MED ORDER — VILAZODONE HCL 40 MG PO TABS
40.0000 mg | ORAL_TABLET | Freq: Every day | ORAL | 1 refills | Status: DC
Start: 2023-04-30 — End: 2023-07-29

## 2023-04-30 MED ORDER — HYDROCHLOROTHIAZIDE 50 MG PO TABS
25.0000 mg | ORAL_TABLET | Freq: Every day | ORAL | 3 refills | Status: DC
Start: 2023-04-30 — End: 2023-07-30

## 2023-04-30 NOTE — Progress Notes (Deleted)
   Established Patient Office Visit  Subjective   Patient ID: Aimee Little, female    DOB: [birth year]  Age: 62 y.o. MRN: [id number]  Chief Complaint  Patient presents with  . Annual Exam    HPI  {History (Optional):23778}  ROS    Objective:     BP 96/82   Pulse 92   Ht 5\' 8"  (1.727 m)   Wt 216 lb 1.9 oz (98 kg)   SpO2 97%   BMI 32.86 kg/m  BP Readings from Last 3 Encounters:  04/30/23 96/82  03/07/23 (!) 118/53  01/24/23 107/69   Wt Readings from Last 3 Encounters:  04/30/23 216 lb 1.9 oz (98 kg)  03/07/23 207 lb (93.9 kg)  01/24/23 206 lb 12.8 oz (93.8 kg)      Physical Exam    Assessment & Plan:     No follow-ups on file.    Tandy Gaw, PA-C

## 2023-04-30 NOTE — Patient Instructions (Signed)

## 2023-04-30 NOTE — Progress Notes (Signed)
Complete physical exam  Patient: Aimee Little   DOB: 10-04-1960   62 y.o. Female  MRN: [id number]  Subjective:    Chief Complaint  Patient presents with   Annual Exam    Aimee Little is a 62 y.o. female who presents today for a complete physical exam. She reports consuming a general diet.  She does try to walk most days at least 1 mile.   She generally feels fairly well. She reports sleeping most of time fairly well but not been sleeping well for the last few days. She does not have additional problems to discuss today.    Most recent fall risk assessment:    04/30/2023   10:14 AM  Fall Risk   Falls in the past year? 0  Number falls in past yr: 0  Injury with Fall? 0  Risk for fall due to : No Fall Risks  Follow up Falls evaluation completed     Most recent depression screenings:    04/30/2023   10:12 AM 03/07/2023   11:52 AM  PHQ 2/9 Scores  PHQ - 2 Score 4 5  PHQ- 9 Score 9 17    Vision:Within last year and Dental: No current dental problems and Receives regular dental care  Patient Active Problem List   Diagnosis Date Noted   Current smoker 04/30/2023   Vitamin D deficiency 04/30/2023   Adjustment disorder with mixed anxiety and depressed mood 10/18/2022   Abnormal weight gain 10/18/2022   Elevated hemoglobin (HCC) 05/31/2022   Chills (without fever) 05/31/2022   No energy 05/31/2022   Petechiae 04/22/2022   Anxiety and depression 03/21/2022   Hypokalemia 03/21/2022   Positive colorectal cancer screening using Cologuard test 03/15/2022   Chronic bilateral low back pain without sciatica 02/04/2022   Chronic constipation 09/04/2021   Aortic atherosclerosis (HCC) 06/22/2021   Coronary artery calcification seen on CT scan 06/22/2021   Emphysema lung (HCC) 06/22/2021   Pulmonary nodules 06/22/2021   Bruised rib 06/05/2021   Hypertension goal BP (blood pressure) < 130/80 02/28/2021   Abnormal ECG 11/20/2020   Bilateral lower extremity edema 09/01/2020    Mixed hyperlipidemia 01/20/2020   DDD (degenerative disc disease), thoracic 01/04/2020   Chronic bilateral low back pain with bilateral sciatica 12/13/2019   Osteopenia 03/24/2019   Chronic bilateral thoracic back pain 08/04/2018   OSA on CPAP 05/29/2018   DDD (degenerative disc disease), lumbar 04/01/2018   Light tobacco smoker 10/01/2017   Essential hypertension 10/01/2017   Diverticulitis 09/10/2017   Diverticulitis of colon without hemorrhage 07/19/2016   Loose body in knee 04/17/2016   Chondromalacia of patellofemoral joint 04/17/2016   Generalized anxiety disorder 03/19/2016   Panic attacks 03/19/2016   Vaginal atrophy 01/02/2016   Severe episode of recurrent major depressive disorder, without psychotic features (HCC) 11/29/2015   Rhinitis, allergic 11/29/2015   Insomnia 11/21/2015   Hypothyroid 10/27/2015   RLS (restless legs syndrome) 09/22/2015   Chronic fatigue 05/26/2015   Sinobronchitis 04/13/2015   Class 1 obesity due to excess calories without serious comorbidity with body mass index (BMI) of 31.0 to 31.9 in adult 11/25/2014   Adenomatous colon polyp 12/20/2013   Anxiety 12/17/2013   Chronic pain syndrome 11/05/2013   Migraine without status migrainosus, not intractable 07/21/2013   Unspecified vitamin D deficiency 05/16/2013   Fibromyalgia 01/01/2013   HOT FLASHES 12/29/2009   DISC DISEASE, LUMBAR 12/29/2009   Past Medical History:  Diagnosis Date   DDD (degenerative disc disease), lumbar  Depression    Fibromyalgia    Insomnia    Migraine    Thyroid disease    Tobacco use    Past Surgical History:  Procedure Laterality Date   ABDOMINAL HYSTERECTOMY     took uterus and both ovaries left   Family History  Problem Relation Age of Onset   Depression Mother    Hyperlipidemia Mother    Hypertension Mother    Heart attack Father    Hypertension Sister    Diabetes Sister    Aortic aneurysm Sister    Alcohol abuse Brother    Hypertension Brother     Breast cancer Cousin    Breast cancer Cousin    Breast cancer Cousin    Breast cancer Cousin    Breast cancer Cousin    Allergies  Allergen Reactions   Doxepin Other (See Comments)   Pristiq [Desvenlafaxine Succinate Er]     Depression worse.   Remeron [Mirtazapine]     Weird dreams   Estradiol Other (See Comments)    Burning    Lexapro [Escitalopram Oxalate]     jittery   Lisinopril     cough   Losartan     itching   Mirapex [Pramipexole Dihydrochloride]     Nausea and vomiting   Phentermine Diarrhea    Stomach ache/increased anxiety.    Pristiq [Desvenlafaxine] Other (See Comments)    Increased depression   Seroquel [Quetiapine Fumarate]     hallicinations   Trintellix [Vortioxetine]     RLS/increased anxiety      Patient Care Team: Nolene Ebbs as PCP - General (Family Medicine)   Outpatient Medications Prior to Visit  Medication Sig   AMBULATORY NON FORMULARY MEDICATION Tens unit for lumbar spine for low back pain, muscle spasm, lumbar DDD.   BELBUCA 300 MCG FILM SMARTSIG:1 Strip(s) By Mouth Every 12 Hours   CALCIUM-MAGNESIUM-ZINC PO Take by mouth daily.   cholecalciferol (VITAMIN D3) 25 MCG (1000 UT) tablet Take 1,000 Units by mouth daily.   famotidine (PEPCID) 20 MG tablet Take 1 tablet (20 mg total) by mouth 2 (two) times daily.   fluticasone (FLONASE) 50 MCG/ACT nasal spray Place 2 sprays into both nostrils daily.   gabapentin (NEURONTIN) 300 MG capsule TAKE 1 CAPSULE BY MOUTH 3 TIMES A DAY AS NEEDED FOR PAIN/NEUROPATHY   HYDROcodone-acetaminophen (NORCO) 10-325 MG tablet Take 1 tablet by mouth every 8 (eight) hours as needed for moderate pain.   INCRUSE ELLIPTA 62.5 MCG/ACT AEPB Inhale 1 puff into the lungs daily.   levothyroxine (SYNTHROID) 150 MCG tablet Take one and one-half tablet daily   nabumetone (RELAFEN) 500 MG tablet Take 500 mg by mouth 2 (two) times daily as needed for moderate pain.   Omega 3-6-9 Fatty Acids (OMEGA 3-6-9 COMPLEX PO)  Take 1 capsule by mouth daily.    rizatriptan (MAXALT-MLT) 10 MG disintegrating tablet May repeat in 2 hours if needed for migraine   [DISCONTINUED] amLODipine (NORVASC) 10 MG tablet Take 1 tablet (10 mg total) by mouth daily.   [DISCONTINUED] atorvastatin (LIPITOR) 40 MG tablet Take 1 tablet (40 mg total) by mouth daily.   [DISCONTINUED] cariprazine (VRAYLAR) 1.5 MG capsule Take 1 capsule (1.5 mg total) by mouth daily.   [DISCONTINUED] clonazePAM (KLONOPIN) 0.5 MG tablet Take one tablet as needed for panic attacks.   [DISCONTINUED] hydrochlorothiazide (HYDRODIURIL) 50 MG tablet Take 0.5 tablets (25 mg total) by mouth daily.   [DISCONTINUED] metoCLOPramide (REGLAN) 10 MG tablet TAKE ONE TABLET BY  MOUTH FOUR TIMES DAILY - BEFORE meals AND AT BEDTIME   [DISCONTINUED] omeprazole (PRILOSEC) 40 MG capsule Take 1 capsule (40 mg total) by mouth in the morning and at bedtime.   [DISCONTINUED] predniSONE (DELTASONE) 10 MG tablet Take 10 mg by mouth 5 (five) times daily.   [DISCONTINUED] rOPINIRole (REQUIP) 0.25 MG tablet One tablet before bed may increase by 1 tablet every 4 days until maximum 4 tablets   [DISCONTINUED] topiramate (TOPAMAX) 100 MG tablet Take one tablet twice a day.   [DISCONTINUED] traZODone (DESYREL) 50 MG tablet Take 3 tablets (150 mg total) by mouth at bedtime as needed for sleep.   [DISCONTINUED] Vilazodone HCl (VIIBRYD) 40 MG TABS Take 1 tablet (40 mg total) by mouth daily.   No facility-administered medications prior to visit.    Review of Systems  All other systems reviewed and are negative.      Objective:     BP 96/82   Pulse 92   Ht 5\' 8"  (1.727 m)   Wt 216 lb 1.9 oz (98 kg)   SpO2 97%   BMI 32.86 kg/m  BP Readings from Last 3 Encounters:  04/30/23 96/82  03/07/23 (!) 118/53  01/24/23 107/69   Wt Readings from Last 3 Encounters:  04/30/23 216 lb 1.9 oz (98 kg)  03/07/23 207 lb (93.9 kg)  01/24/23 206 lb 12.8 oz (93.8 kg)    ..    04/30/2023   10:12 AM  03/07/2023   11:52 AM 01/24/2023    1:17 PM 11/26/2022    9:06 AM 11/26/2022    9:03 AM  Depression screen PHQ 2/9  Decreased Interest 2 2 0    Down, Depressed, Hopeless 2 3 1     PHQ - 2 Score 4 5 1     Altered sleeping 2 3     Tired, decreased energy 1 3     Change in appetite 0 2     Feeling bad or failure about yourself  0 1     Trouble concentrating 1 1     Moving slowly or fidgety/restless 1 2     Suicidal thoughts 0 0     PHQ-9 Score 9 17     Difficult doing work/chores Somewhat difficult Very difficult        Information is confidential and restricted. Go to Review Flowsheets to unlock data.   ..    04/30/2023   10:14 AM 03/07/2023   11:52 AM 10/16/2022    3:09 PM 05/29/2022    2:20 PM  GAD 7 : Generalized Anxiety Score  Nervous, Anxious, on Edge 1 2 3 1   Control/stop worrying 0 2 3 2   Worry too much - different things 0 2 3 2   Trouble relaxing 2 1 3 1   Restless 2 0 3 1  Easily annoyed or irritable 2 0 1 0  Afraid - awful might happen 2 0 3 1  Total GAD 7 Score 9 7 19 8   Anxiety Difficulty Somewhat difficult Very difficult Very difficult Somewhat difficult      Physical Exam     BP 96/82   Pulse 92   Ht 5\' 8"  (1.727 m)   Wt 216 lb 1.9 oz (98 kg)   SpO2 97%   BMI 32.86 kg/m   General Appearance:    Alert, cooperative, obese no distress, appears stated age  Head:    Normocephalic, without obvious abnormality, atraumatic  Eyes:    PERRL, conjunctiva/corneas clear, EOM's intact, fundi  benign, both eyes  Ears:    Normal TM's and external ear canals, both ears  Nose:   Nares normal, septum midline, mucosa normal, no drainage    or sinus tenderness  Throat:   Lips, mucosa, and tongue normal; teeth and gums normal  Neck:   Supple, symmetrical, trachea midline, no adenopathy;    thyroid:  no enlargement/tenderness/nodules; no carotid   bruit or JVD  Back:     Symmetric, no curvature, ROM normal, no CVA tenderness  Lungs:     Clear to auscultation bilaterally,  respirations unlabored  Chest Wall:    No tenderness or deformity   Heart:    Regular rate and rhythm, S1 and S2 normal, no murmur, rub   or gallop     Abdomen:     Soft, non-tender, bowel sounds active all four quadrants,    no masses, no organomegaly        Extremities:   Extremities normal, atraumatic, no cyanosis or edema  Pulses:   2+ and symmetric all extremities  Skin:   Skin color, texture, turgor normal, no rashes or lesions  Lymph nodes:   Cervical, supraclavicular, and axillary nodes normal  Neurologic:   CNII-XII intact, normal strength, sensation and reflexes    throughout   Assessment & Plan:    Routine Health Maintenance and Physical Exam  Immunization History  Administered Date(s) Administered   COVID-19, mRNA, vaccine(Comirnaty)12 years and older 07/26/2022   Influenza Whole 06/13/2010   Influenza,inj,Quad PF,6+ Mos 07/21/2013, 06/08/2014, 06/30/2015, 07/02/2016, 05/20/2017, 05/13/2018, 06/22/2019, 07/14/2020   Influenza,inj,quad, With Preservative 06/22/2019   Influenza-Unspecified 06/22/2019   Janssen (J&J) SARS-COV-2 Vaccination 12/18/2019   Moderna Sars-Covid-2 Vaccination 07/29/2020   Respiratory Syncytial Virus Vaccine,Recomb Aduvanted(Arexvy) 06/24/2022   Tdap 01/01/2013   Zoster Recombinant(Shingrix) 07/14/2020, 11/29/2020    Health Maintenance  Topic Date Due   HIV Screening  Never done   DEXA SCAN  03/23/2021   DTaP/Tdap/Td (2 - Td or Tdap) 01/02/2023   COVID-19 Vaccine (4 - 2023-24 season) 07/12/2023 (Originally 09/20/2022)   Medicare Annual Wellness (AWV)  07/26/2023 (Originally 06/19/2022)   INFLUENZA VACCINE  05/01/2023   MAMMOGRAM  07/27/2023   Colonoscopy  01/01/2028   Hepatitis C Screening  Completed   Zoster Vaccines- Shingrix  Completed   HPV VACCINES  Aged Out    Discussed health benefits of physical activity, and encouraged her to engage in regular exercise appropriate for her age and condition.  Aimee Little was seen today for  annual exam.  Diagnoses and all orders for this visit:  Routine adult health maintenance -     TSH -     CBC with Differential/Platelet -     CMP14+EGFR -     Lipid panel -     VITAMIN D 25 Hydroxy (Vit-D Deficiency, Fractures)  Panic attacks -     clonazePAM (KLONOPIN) 0.5 MG tablet; Take one tablet as needed for panic attacks.  RLS (restless legs syndrome) -     rOPINIRole (REQUIP) 1 MG tablet; Take 1 tablet (1 mg total) by mouth at bedtime.  Epigastric pain -     omeprazole (PRILOSEC) 40 MG capsule; Take 1 capsule (40 mg total) by mouth in the morning and at bedtime.  Hypertension goal BP (blood pressure) < 130/80 -     CMP14+EGFR -     amLODipine (NORVASC) 10 MG tablet; Take 1 tablet (10 mg total) by mouth daily. -     hydrochlorothiazide (HYDRODIURIL) 50 MG tablet; Take 0.5 tablets (25  mg total) by mouth daily.  Severe episode of recurrent major depressive disorder, without psychotic features (HCC) -     cariprazine (VRAYLAR) 1.5 MG capsule; Take 1 capsule (1.5 mg total) by mouth daily. -     Vilazodone HCl (VIIBRYD) 40 MG TABS; Take 1 tablet (40 mg total) by mouth daily.  Insomnia due to other mental disorder -     traZODone (DESYREL) 50 MG tablet; Take 3 tablets (150 mg total) by mouth at bedtime as needed for sleep.  Migraine without status migrainosus, not intractable, unspecified migraine type -     topiramate (TOPAMAX) 100 MG tablet; Take one tablet twice a day.  Hypothyroidism, unspecified type -     TSH  Mixed hyperlipidemia -     Lipid panel -     atorvastatin (LIPITOR) 40 MG tablet; Take 1 tablet (40 mg total) by mouth daily.  Vitamin D deficiency -     VITAMIN D 25 Hydroxy (Vit-D Deficiency, Fractures)  Current smoker  Nausea -     ondansetron (ZOFRAN-ODT) 8 MG disintegrating tablet; Take 1 tablet (8 mg total) by mouth every 8 (eight) hours as needed.   .. Discussed 150 minutes of exercise a week.  Encouraged vitamin D 1000 units and Calcium 1300mg   or 4 servings of dairy a day.  Fasting labs ordered Lipitor refilled PHQ/GAD not to goal Recent death of a friend but does not want to change medication Klonapin refilled to use sparingly and not with opiods Mammogram UTD and due in October Colonoscopy UTD Vaccines UTD Pt stated she had Tdap done at pharmacy. Will look for this to add.   Migraines-controlled refilled topamax and as needed maxalt  GERD-refilled omeprazole.   HTN- controlled refilled BP medication  RLS- refilled requip 1mg  at bedtime.   Insomnia-refilled trazadone  Return in about 6 months (around 10/31/2023).  Tandy Gaw, PA-C

## 2023-05-01 DIAGNOSIS — E782 Mixed hyperlipidemia: Secondary | ICD-10-CM | POA: Diagnosis not present

## 2023-05-01 DIAGNOSIS — E559 Vitamin D deficiency, unspecified: Secondary | ICD-10-CM | POA: Diagnosis not present

## 2023-05-01 DIAGNOSIS — I10 Essential (primary) hypertension: Secondary | ICD-10-CM | POA: Diagnosis not present

## 2023-05-01 DIAGNOSIS — E039 Hypothyroidism, unspecified: Secondary | ICD-10-CM | POA: Diagnosis not present

## 2023-05-01 DIAGNOSIS — Z0000 Encounter for general adult medical examination without abnormal findings: Secondary | ICD-10-CM | POA: Diagnosis not present

## 2023-05-02 DIAGNOSIS — G8929 Other chronic pain: Secondary | ICD-10-CM | POA: Diagnosis not present

## 2023-05-02 DIAGNOSIS — M546 Pain in thoracic spine: Secondary | ICD-10-CM | POA: Diagnosis not present

## 2023-05-02 DIAGNOSIS — M47814 Spondylosis without myelopathy or radiculopathy, thoracic region: Secondary | ICD-10-CM | POA: Diagnosis not present

## 2023-05-02 DIAGNOSIS — I10 Essential (primary) hypertension: Secondary | ICD-10-CM | POA: Diagnosis not present

## 2023-05-02 DIAGNOSIS — Z6831 Body mass index (BMI) 31.0-31.9, adult: Secondary | ICD-10-CM | POA: Diagnosis not present

## 2023-05-02 DIAGNOSIS — M545 Low back pain, unspecified: Secondary | ICD-10-CM | POA: Diagnosis not present

## 2023-05-02 DIAGNOSIS — E6609 Other obesity due to excess calories: Secondary | ICD-10-CM | POA: Diagnosis not present

## 2023-05-02 DIAGNOSIS — F17210 Nicotine dependence, cigarettes, uncomplicated: Secondary | ICD-10-CM | POA: Diagnosis not present

## 2023-05-02 NOTE — Progress Notes (Signed)
Aimee Little,   Cholesterol looks good.  Thyroid normal range.  Vitamin D too high. Need to back down some. Exactly how much are you taking?  WBC up but you did have steroid recently?

## 2023-05-09 ENCOUNTER — Encounter: Payer: Self-pay | Admitting: Physician Assistant

## 2023-05-14 DIAGNOSIS — F17210 Nicotine dependence, cigarettes, uncomplicated: Secondary | ICD-10-CM | POA: Diagnosis not present

## 2023-05-14 DIAGNOSIS — Z79899 Other long term (current) drug therapy: Secondary | ICD-10-CM | POA: Diagnosis not present

## 2023-05-14 DIAGNOSIS — Z6832 Body mass index (BMI) 32.0-32.9, adult: Secondary | ICD-10-CM | POA: Diagnosis not present

## 2023-05-14 DIAGNOSIS — M545 Low back pain, unspecified: Secondary | ICD-10-CM | POA: Diagnosis not present

## 2023-05-14 DIAGNOSIS — M797 Fibromyalgia: Secondary | ICD-10-CM | POA: Diagnosis not present

## 2023-05-14 DIAGNOSIS — E6609 Other obesity due to excess calories: Secondary | ICD-10-CM | POA: Diagnosis not present

## 2023-05-14 DIAGNOSIS — G8929 Other chronic pain: Secondary | ICD-10-CM | POA: Diagnosis not present

## 2023-05-14 DIAGNOSIS — I10 Essential (primary) hypertension: Secondary | ICD-10-CM | POA: Diagnosis not present

## 2023-05-14 DIAGNOSIS — M47814 Spondylosis without myelopathy or radiculopathy, thoracic region: Secondary | ICD-10-CM | POA: Diagnosis not present

## 2023-06-14 DIAGNOSIS — I10 Essential (primary) hypertension: Secondary | ICD-10-CM | POA: Diagnosis not present

## 2023-06-14 DIAGNOSIS — G8929 Other chronic pain: Secondary | ICD-10-CM | POA: Diagnosis not present

## 2023-06-14 DIAGNOSIS — Z6832 Body mass index (BMI) 32.0-32.9, adult: Secondary | ICD-10-CM | POA: Diagnosis not present

## 2023-06-14 DIAGNOSIS — M797 Fibromyalgia: Secondary | ICD-10-CM | POA: Diagnosis not present

## 2023-06-14 DIAGNOSIS — M47814 Spondylosis without myelopathy or radiculopathy, thoracic region: Secondary | ICD-10-CM | POA: Diagnosis not present

## 2023-06-14 DIAGNOSIS — F17210 Nicotine dependence, cigarettes, uncomplicated: Secondary | ICD-10-CM | POA: Diagnosis not present

## 2023-06-14 DIAGNOSIS — Z79899 Other long term (current) drug therapy: Secondary | ICD-10-CM | POA: Diagnosis not present

## 2023-06-14 DIAGNOSIS — E6609 Other obesity due to excess calories: Secondary | ICD-10-CM | POA: Diagnosis not present

## 2023-06-14 DIAGNOSIS — M545 Low back pain, unspecified: Secondary | ICD-10-CM | POA: Diagnosis not present

## 2023-06-25 ENCOUNTER — Ambulatory Visit: Payer: Medicare Other | Admitting: Physician Assistant

## 2023-06-25 ENCOUNTER — Encounter: Payer: Self-pay | Admitting: Physician Assistant

## 2023-06-25 VITALS — BP 131/75 | HR 100 | Ht 68.0 in | Wt 221.0 lb

## 2023-06-25 DIAGNOSIS — F5101 Primary insomnia: Secondary | ICD-10-CM

## 2023-06-25 DIAGNOSIS — G8929 Other chronic pain: Secondary | ICD-10-CM | POA: Diagnosis not present

## 2023-06-25 DIAGNOSIS — R110 Nausea: Secondary | ICD-10-CM | POA: Diagnosis not present

## 2023-06-25 DIAGNOSIS — M25561 Pain in right knee: Secondary | ICD-10-CM

## 2023-06-25 DIAGNOSIS — M25562 Pain in left knee: Secondary | ICD-10-CM | POA: Diagnosis not present

## 2023-06-25 MED ORDER — KETOROLAC TROMETHAMINE 60 MG/2ML IM SOLN
60.0000 mg | Freq: Once | INTRAMUSCULAR | Status: AC
Start: 2023-06-25 — End: 2023-06-25
  Administered 2023-06-25: 60 mg via INTRAMUSCULAR

## 2023-06-25 MED ORDER — METOCLOPRAMIDE HCL 10 MG PO TABS
5.0000 mg | ORAL_TABLET | Freq: Three times a day (TID) | ORAL | 2 refills | Status: DC | PRN
Start: 2023-06-25 — End: 2023-07-30

## 2023-06-25 MED ORDER — DICLOFENAC SODIUM 1 % EX GEL
4.0000 g | Freq: Four times a day (QID) | CUTANEOUS | 1 refills | Status: AC
Start: 2023-06-25 — End: ?

## 2023-06-25 MED ORDER — ZOLPIDEM TARTRATE 5 MG PO TABS: 5.0000 mg | ORAL_TABLET | Freq: Every evening | ORAL | 2 refills | Status: DC | PRN

## 2023-06-25 MED ORDER — TRAZODONE HCL 100 MG PO TABS
ORAL_TABLET | ORAL | 1 refills | Status: DC
Start: 2023-06-25 — End: 2023-08-18

## 2023-06-25 NOTE — Progress Notes (Unsigned)
Established Patient Office Visit  Subjective   Patient ID: Aimee Little, female    DOB: 11-17-1960  Age: 62 y.o. MRN: [id number]  No chief complaint on file.   HPI Pt is a 62 yo female who presents to the clinic with some concerns.   She has had intermittent bilateral knee pain due to arthritis for years but recently pain has worsened and for the last week it has been really hard to walk. Pain clinic recently gave her injections. They have not helped as much as they have in the past. She has had x rays but not sure how long it as been. She request referral to ortho.   She has stopped vraylar for mood because it was causing her to eat more. She is doing ok off vraylar, it did work really well. She is not on semaglutide but she has not been losing weight. She does have some intermittent nausea. Zofran makes worse. Request reglan.   She is not able to go to sleep on 200mg  of trazodone. She would like something else to help.    .. Active Ambulatory Problems    Diagnosis Date Noted   HOT FLASHES 12/29/2009   DISC DISEASE, LUMBAR 12/29/2009   Fibromyalgia 01/01/2013   Unspecified vitamin D deficiency 05/16/2013   Migraine without status migrainosus, not intractable 07/21/2013   Chronic pain syndrome 11/05/2013   Anxiety 12/17/2013   Adenomatous colon polyp 12/20/2013   Class 1 obesity due to excess calories without serious comorbidity with body mass index (BMI) of 31.0 to 31.9 in adult 11/25/2014   Sinobronchitis 04/13/2015   Chronic fatigue 05/26/2015   RLS (restless legs syndrome) 09/22/2015   Hypothyroid 10/27/2015   Insomnia 11/21/2015   Severe episode of recurrent major depressive disorder, without psychotic features (HCC) 11/29/2015   Rhinitis, allergic 11/29/2015   Vaginal atrophy 01/02/2016   Generalized anxiety disorder 03/19/2016   Panic attacks 03/19/2016   Loose body in knee 04/17/2016   Chondromalacia of patellofemoral joint 04/17/2016   Diverticulitis of colon  without hemorrhage 07/19/2016   Diverticulitis 09/10/2017   Light tobacco smoker 10/01/2017   Essential hypertension 10/01/2017   DDD (degenerative disc disease), lumbar 04/01/2018   OSA on CPAP 05/29/2018   Chronic bilateral thoracic back pain 08/04/2018   Osteopenia 03/24/2019   Chronic bilateral low back pain with bilateral sciatica 12/13/2019   DDD (degenerative disc disease), thoracic 01/04/2020   Mixed hyperlipidemia 01/20/2020   Epigastric pain 09/01/2020   Bilateral lower extremity edema 09/01/2020   Abnormal ECG 11/20/2020   Hypertension goal BP (blood pressure) < 130/80 02/28/2021   Bruised rib 06/05/2021   Aortic atherosclerosis (HCC) 06/22/2021   Coronary artery calcification seen on CT scan 06/22/2021   Emphysema lung (HCC) 06/22/2021   Pulmonary nodules 06/22/2021   Chronic constipation 09/04/2021   Chronic bilateral low back pain without sciatica 02/04/2022   Positive colorectal cancer screening using Cologuard test 03/15/2022   Anxiety and depression 03/21/2022   Hypokalemia 03/21/2022   Petechiae 04/22/2022   Elevated hemoglobin (HCC) 05/31/2022   Chills (without fever) 05/31/2022   No energy 05/31/2022   Adjustment disorder with mixed anxiety and depressed mood 10/18/2022   Abnormal weight gain 10/18/2022   Current smoker 04/30/2023   Vitamin D deficiency 04/30/2023   Chronic pain of both knees 06/26/2023   Resolved Ambulatory Problems    Diagnosis Date Noted   Unspecified hypothyroidism 12/29/2009   TOBACCO ABUSE 06/13/2010   GRIEF REACTION, ACUTE 12/29/2009   Depression 02/06/2010  Osteoarthrosis involving lower leg 12/29/2009   NAUSEA AND VOMITING 05/02/2010   Osteoarthritis of left knee 07/21/2013   Medication management 11/05/2013   Nausea with vomiting 04/07/2014   Lumbago 11/25/2014   Sacroiliac joint dysfunction of both sides 09/06/2015   Bilateral low back pain without sciatica 09/06/2015   Right knee pain 11/07/2015   Left knee pain  01/23/2016   Abdominal pain, left lower quadrant 04/25/2016   Trochanteric bursitis of left hip 01/29/2017   Nonintractable headache 03/06/2017   Tachycardia 03/06/2017   Rib pain on left side 08/03/2017   Elevated blood pressure reading 10/01/2017   Rib pain on right side 11/04/2017   Traumatic hematoma of eyelid, right, initial encounter 11/04/2017   Low grade fever 04/01/2018   Acute bilateral low back pain with bilateral sciatica 04/01/2018   Closed fracture of one rib of right side 12/01/2018   Upper back pain 11/30/2019   Mid back pain 12/13/2019   Muscle spasm of back 12/13/2019   Chest pain 09/01/2020   Past Medical History:  Diagnosis Date   Migraine    Thyroid disease    Tobacco use      ROS See HPI.    Objective:     BP 131/75   Pulse 100   Ht 5\' 8"  (1.727 m)   Wt 221 lb (100.2 kg)   SpO2 96%   BMI 33.60 kg/m  BP Readings from Last 3 Encounters:  06/25/23 131/75  04/30/23 96/82  03/07/23 (!) 118/53   Wt Readings from Last 3 Encounters:  06/25/23 221 lb (100.2 kg)  04/30/23 216 lb 1.9 oz (98 kg)  03/07/23 207 lb (93.9 kg)      Physical Exam Constitutional:      Appearance: Normal appearance. She is obese.  HENT:     Head: Normocephalic.  Cardiovascular:     Rate and Rhythm: Normal rate and regular rhythm.  Pulmonary:     Effort: Pulmonary effort is normal.  Musculoskeletal:     Cervical back: Normal range of motion.     Comments: Swollen and tender bilateral knees   Neurological:     General: No focal deficit present.     Mental Status: She is alert and oriented to person, place, and time.  Psychiatric:        Mood and Affect: Mood normal.         Assessment & Plan:  Marland KitchenMarland KitchenDiagnoses and all orders for this visit:  Chronic pain of both knees -     diclofenac Sodium (VOLTAREN) 1 % GEL; Apply 4 g topically 4 (four) times daily. To affected joint. -     ketorolac (TORADOL) injection 60 mg -     Ambulatory referral to Orthopedic  Surgery  Primary insomnia -     traZODone (DESYREL) 100 MG tablet; Take one to two tablets at bedtime. -     zolpidem (AMBIEN) 5 MG tablet; Take 1 tablet (5 mg total) by mouth at bedtime as needed for sleep.  Nausea -     metoCLOPramide (REGLAN) 10 MG tablet; Take 0.5 tablets (5 mg total) by mouth every 8 (eight) hours as needed for nausea (nausea/reflux).   Toradol shot given for all over pain Pt continues to go to pain clinic for pain management Diclofenac gel given for knee pain Recently had steroid injections of both knees Will make referral to ortho  Add low dose ambien  at 5mg  to a decrease of  trazodone at 100mg  Follow up in 1 month  Stop zofran Restart reglan at patients request  Tandy Gaw, PA-C

## 2023-06-25 NOTE — Patient Instructions (Signed)
Start ambien 5mg  with trazodone 100mg  Will make referral for knees Reglan sent for anti-nausea

## 2023-06-26 ENCOUNTER — Encounter: Payer: Self-pay | Admitting: Physician Assistant

## 2023-06-26 DIAGNOSIS — G8929 Other chronic pain: Secondary | ICD-10-CM | POA: Insufficient documentation

## 2023-07-03 ENCOUNTER — Other Ambulatory Visit: Payer: Self-pay | Admitting: Physician Assistant

## 2023-07-03 DIAGNOSIS — F5105 Insomnia due to other mental disorder: Secondary | ICD-10-CM

## 2023-07-07 DIAGNOSIS — M25562 Pain in left knee: Secondary | ICD-10-CM | POA: Diagnosis not present

## 2023-07-07 DIAGNOSIS — M797 Fibromyalgia: Secondary | ICD-10-CM | POA: Diagnosis not present

## 2023-07-07 DIAGNOSIS — Z6832 Body mass index (BMI) 32.0-32.9, adult: Secondary | ICD-10-CM | POA: Diagnosis not present

## 2023-07-07 DIAGNOSIS — Z79899 Other long term (current) drug therapy: Secondary | ICD-10-CM | POA: Diagnosis not present

## 2023-07-07 DIAGNOSIS — F17210 Nicotine dependence, cigarettes, uncomplicated: Secondary | ICD-10-CM | POA: Diagnosis not present

## 2023-07-07 DIAGNOSIS — G8929 Other chronic pain: Secondary | ICD-10-CM | POA: Diagnosis not present

## 2023-07-07 DIAGNOSIS — E6609 Other obesity due to excess calories: Secondary | ICD-10-CM | POA: Diagnosis not present

## 2023-07-07 DIAGNOSIS — M545 Low back pain, unspecified: Secondary | ICD-10-CM | POA: Diagnosis not present

## 2023-07-07 DIAGNOSIS — I10 Essential (primary) hypertension: Secondary | ICD-10-CM | POA: Diagnosis not present

## 2023-07-07 DIAGNOSIS — M47814 Spondylosis without myelopathy or radiculopathy, thoracic region: Secondary | ICD-10-CM | POA: Diagnosis not present

## 2023-07-07 DIAGNOSIS — M25561 Pain in right knee: Secondary | ICD-10-CM | POA: Diagnosis not present

## 2023-07-10 ENCOUNTER — Encounter: Payer: Self-pay | Admitting: Family Medicine

## 2023-07-10 ENCOUNTER — Ambulatory Visit (INDEPENDENT_AMBULATORY_CARE_PROVIDER_SITE_OTHER): Payer: Medicare Other | Admitting: Family Medicine

## 2023-07-10 VITALS — BP 102/68 | HR 89 | Temp 98.3°F | Ht 68.0 in | Wt 223.0 lb

## 2023-07-10 DIAGNOSIS — J0140 Acute pansinusitis, unspecified: Secondary | ICD-10-CM

## 2023-07-10 DIAGNOSIS — J029 Acute pharyngitis, unspecified: Secondary | ICD-10-CM

## 2023-07-10 DIAGNOSIS — G43909 Migraine, unspecified, not intractable, without status migrainosus: Secondary | ICD-10-CM

## 2023-07-10 DIAGNOSIS — J069 Acute upper respiratory infection, unspecified: Secondary | ICD-10-CM

## 2023-07-10 LAB — POC COVID19 BINAXNOW: SARS Coronavirus 2 Ag: NEGATIVE

## 2023-07-10 LAB — POCT INFLUENZA A/B
Influenza A, POC: NEGATIVE
Influenza B, POC: NEGATIVE

## 2023-07-10 LAB — POCT RAPID STREP A (OFFICE): Rapid Strep A Screen: NEGATIVE

## 2023-07-10 MED ORDER — FLUTICASONE PROPIONATE 50 MCG/ACT NA SUSP: 2.0000 | Freq: Every day | NASAL | 0 refills | Status: AC

## 2023-07-10 MED ORDER — RIZATRIPTAN BENZOATE 10 MG PO TBDP
ORAL_TABLET | ORAL | 1 refills | Status: DC
Start: 2023-07-10 — End: 2024-01-27

## 2023-07-10 NOTE — Patient Instructions (Signed)
Increase fluid intake Add delsym as needed for cough.  Mucinex to help with chest congestion. Tylenol for headache/body aches   Upper Respiratory Infection, Adult An upper respiratory infection (URI) is a common viral infection of the nose, throat, and upper air passages that lead to the lungs. The most common type of URI is the common cold. URIs usually get better on their own, without medical treatment. What are the causes? A URI is caused by a virus. You may catch a virus by: Breathing in droplets from an infected person's cough or sneeze. Touching something that has been exposed to the virus (is contaminated) and then touching your mouth, nose, or eyes. What increases the risk? You are more likely to get a URI if: You are very young or very old. You have close contact with others, such as at work, school, or a health care facility. You smoke. You have long-term (chronic) heart or lung disease. You have a weakened disease-fighting system (immune system). You have nasal allergies or asthma. You are experiencing a lot of stress. You have poor nutrition. What are the signs or symptoms? A URI usually involves some of the following symptoms: Runny or stuffy (congested) nose. Cough. Sneezing. Sore throat. Headache. Fatigue. Fever. Loss of appetite. Pain in your forehead, behind your eyes, and over your cheekbones (sinus pain). Muscle aches. Redness or irritation of the eyes. Pressure in the ears or face. How is this diagnosed? This condition may be diagnosed based on your medical history and symptoms, and a physical exam. Your health care provider may use a swab to take a mucus sample from your nose (nasal swab). This sample can be tested to determine what virus is causing the illness. How is this treated? URIs usually get better on their own within 7-10 days. Medicines cannot cure URIs, but your health care provider may recommend certain medicines to help relieve symptoms, such  as: Over-the-counter cold medicines. Cough suppressants. Coughing is a type of defense against infection that helps to clear the respiratory system, so take these medicines only as recommended by your health care provider. Fever-reducing medicines. Follow these instructions at home: Activity Rest as needed. If you have a fever, stay home from work or school until your fever is gone or until your health care provider says your URI cannot spread to other people (is no longer contagious). Your health care provider may have you wear a face mask to prevent your infection from spreading. Relieving symptoms Gargle with a mixture of salt and water 3-4 times a day or as needed. To make salt water, completely dissolve -1 tsp (3-6 g) of salt in 1 cup (237 mL) of warm water. Use a cool-mist humidifier to add moisture to the air. This can help you breathe more easily. Eating and drinking  Drink enough fluid to keep your urine pale yellow. Eat soups and other clear broths. General instructions  Take over-the-counter and prescription medicines only as told by your health care provider. These include cold medicines, fever reducers, and cough suppressants. Do not use any products that contain nicotine or tobacco. These products include cigarettes, chewing tobacco, and vaping devices, such as e-cigarettes. If you need help quitting, ask your health care provider. Stay away from secondhand smoke. Stay up to date on all immunizations, including the yearly (annual) flu vaccine. Keep all follow-up visits. This is important. How to prevent the spread of infection to others URIs can be contagious. To prevent the infection from spreading: Wash your hands with  soap and water for at least 20 seconds. If soap and water are not available, use hand sanitizer. Avoid touching your mouth, face, eyes, or nose. Cough or sneeze into a tissue or your sleeve or elbow instead of into your hand or into the air.  Contact a  health care provider if: You are getting worse instead of better. You have a fever or chills. Your mucus is brown or red. You have yellow or brown discharge coming from your nose. You have pain in your face, especially when you bend forward. You have swollen neck glands. You have pain while swallowing. You have white areas in the back of your throat. Get help right away if: You have shortness of breath that gets worse. You have severe or persistent: Headache. Ear pain. Sinus pain. Chest pain. You have chronic lung disease along with any of the following: Making high-pitched whistling sounds when you breathe, most often when you breathe out (wheezing). Prolonged cough (more than 14 days). Coughing up blood. A change in your usual mucus. You have a stiff neck. You have changes in your: Vision. Hearing. Thinking. Mood. These symptoms may be an emergency. Get help right away. Call 911. Do not wait to see if the symptoms will go away. Do not drive yourself to the hospital. Summary An upper respiratory infection (URI) is a common infection of the nose, throat, and upper air passages that lead to the lungs. A URI is caused by a virus. URIs usually get better on their own within 7-10 days. Medicines cannot cure URIs, but your health care provider may recommend certain medicines to help relieve symptoms. This information is not intended to replace advice given to you by your health care provider. Make sure you discuss any questions you have with your health care provider. Document Revised: 04/18/2021 Document Reviewed: 04/18/2021 Elsevier Patient Education  2024 [employer].

## 2023-07-10 NOTE — Assessment & Plan Note (Signed)
POC COVID and Flu negative.  She has not tried anything for symptoms so far.  Encouraged adequate hydration and OTC medications for symptom management.  Red flags discussed.  Contact clinic if not improving.

## 2023-07-10 NOTE — Progress Notes (Signed)
Aimee Little - 62 y.o. female MRN [id number]  Date of birth: 05-16-1961  Subjective Chief Complaint  Patient presents with   Sinusitis   Headache   Sore Throat    HPI Aimee Little is a 62 y.o. female here today with complaint of congestion, sinus pain/pressure, sore throat and headache.  Symptoms started about 2 days ago and have been worsening since onset.  She has not had fever.  She has mild cough.  Denies dyspnea or wheezing.  She does have history of emphysema.  So far she has tried nothing for her symptoms.  ROS:  A comprehensive ROS was completed and negative except as noted per HPI .   Allergies  Allergen Reactions   Doxepin Other (See Comments)   Pristiq [Desvenlafaxine Succinate Er]     Depression worse.   Remeron [Mirtazapine]     Weird dreams   Estradiol Other (See Comments)    Burning    Lexapro [Escitalopram Oxalate]     jittery   Lisinopril     cough   Losartan     itching   Mirapex [Pramipexole Dihydrochloride]     Nausea and vomiting   Phentermine Diarrhea    Stomach ache/increased anxiety.    Pristiq [Desvenlafaxine] Other (See Comments)    Increased depression   Seroquel [Quetiapine Fumarate]     hallicinations   Trintellix [Vortioxetine]     RLS/increased anxiety    Past Medical History:  Diagnosis Date   DDD (degenerative disc disease), lumbar    Depression    Fibromyalgia    Insomnia    Migraine    Thyroid disease    Tobacco use     Past Surgical History:  Procedure Laterality Date   ABDOMINAL HYSTERECTOMY     took uterus and both ovaries left    Social History   Socioeconomic History   Marital status: Divorced    Spouse name: Not on file   Number of children: Not on file   Years of education: Not on file   Highest education level: Associate degree: occupational, Scientist, product/process development, or vocational program  Occupational History   Not on file  Tobacco Use   Smoking status: Light Smoker    Current packs/day: 0.00    Types:  Cigarettes    Last attempt to quit: 10/02/2013    Years since quitting: 9.7   Smokeless tobacco: Never   Tobacco comments:    Reports  trying to quit but had not done as well the past 2 weeks.   Substance and Sexual Activity   Alcohol use: Not Currently    Comment: Occasional drink - 1 beer a month   Drug use: No   Sexual activity: Not Currently  Other Topics Concern   Not on file  Social History Narrative   Not on file   Social Determinants of Health   Financial Resource Strain: Medium Risk (01/23/2023)   Overall Financial Resource Strain (CARDIA)    Difficulty of Paying Living Expenses: Somewhat hard  Food Insecurity: Food Insecurity Present (01/23/2023)   Hunger Vital Sign    Worried About Running Out of Food in the Last Year: Sometimes true    Ran Out of Food in the Last Year: Never true  Transportation Needs: No Transportation Needs (01/23/2023)   PRAPARE - Administrator, Civil Service (Medical): No    Lack of Transportation (Non-Medical): No  Physical Activity: Insufficiently Active (01/23/2023)   Exercise Vital Sign    Days of Exercise  per Week: 3 days    Minutes of Exercise per Session: 30 min  Stress: Stress Concern Present (01/23/2023)   [employer] of Occupational Health - Occupational Stress Questionnaire    Feeling of Stress : To some extent  Social Connections: Moderately Integrated (01/23/2023)   Social Connection and Isolation Panel [NHANES]    Frequency of Communication with Friends and Family: More than three times a week    Frequency of Social Gatherings with Friends and Family: More than three times a week    Attends Religious Services: More than 4 times per year    Active Member of Clubs or Organizations: Yes    Attends Engineer, structural: More than 4 times per year    Marital Status: Divorced    Family History  Problem Relation Age of Onset   Depression Mother    Hyperlipidemia Mother    Hypertension Mother    Heart attack  Father    Hypertension Sister    Diabetes Sister    Aortic aneurysm Sister    Alcohol abuse Brother    Hypertension Brother    Breast cancer Cousin    Breast cancer Cousin    Breast cancer Cousin    Breast cancer Cousin    Breast cancer Cousin     Health Maintenance  Topic Date Due   DEXA SCAN  03/23/2021   COVID-19 Vaccine (4 - 2023-24 season) 06/01/2023   Medicare Annual Wellness (AWV)  07/26/2023 (Originally 06/19/2022)   HIV Screening  06/24/2024 (Originally [birth year]/04/1974)   MAMMOGRAM  07/27/2023   Colonoscopy  01/01/2028   DTaP/Tdap/Td (3 - Td or Tdap) 06/13/2032   INFLUENZA VACCINE  Completed   Hepatitis C Screening  Completed   Zoster Vaccines- Shingrix  Completed   HPV VACCINES  Aged Out     ----------------------------------------------------------------------------------------------------------------------------------------------------------------------------------------------------------------- Physical Exam BP 102/68 (BP Location: Left Arm, Patient Position: Sitting, Cuff Size: Large)   Pulse 89   Temp 98.3 F (36.8 C) (Oral)   Ht 5\' 8"  (1.727 m)   Wt 223 lb (101.2 kg)   SpO2 98%   BMI 33.91 kg/m   Physical Exam Constitutional:      Appearance: Normal appearance.  HENT:     Head: Normocephalic and atraumatic.     Right Ear: Tympanic membrane normal.     Left Ear: Tympanic membrane normal.     Mouth/Throat:     Mouth: Mucous membranes are moist.  Eyes:     General: No scleral icterus. Cardiovascular:     Rate and Rhythm: Normal rate and regular rhythm.  Pulmonary:     Effort: Pulmonary effort is normal.     Breath sounds: Normal breath sounds.  Musculoskeletal:     Cervical back: Neck supple.  Neurological:     General: No focal deficit present.     Mental Status: She is alert.  Psychiatric:        Mood and Affect: Mood normal.        Behavior: Behavior normal.      ------------------------------------------------------------------------------------------------------------------------------------------------------------------------------------------------------------------- Assessment and Plan  URI with cough and congestion POC COVID and Flu negative.  She has not tried anything for symptoms so far.  Encouraged adequate hydration and OTC medications for symptom management.  Red flags discussed.  Contact clinic if not improving.    Meds ordered this encounter  Medications   rizatriptan (MAXALT-MLT) 10 MG disintegrating tablet    Sig: May repeat in 2 hours if needed for migraine    Dispense:  30  tablet    Refill:  1   fluticasone (FLONASE) 50 MCG/ACT nasal spray    Sig: Place 2 sprays into both nostrils daily.    Dispense:  16 g    Refill:  0    No follow-ups on file.    This visit occurred during the SARS-CoV-2 public health emergency.  Safety protocols were in place, including screening questions prior to the visit, additional usage of staff PPE, and extensive cleaning of exam room while observing appropriate contact time as indicated for disinfecting solutions.

## 2023-07-24 ENCOUNTER — Other Ambulatory Visit: Payer: Self-pay | Admitting: Physician Assistant

## 2023-07-24 DIAGNOSIS — F32A Depression, unspecified: Secondary | ICD-10-CM

## 2023-07-24 DIAGNOSIS — M545 Low back pain, unspecified: Secondary | ICD-10-CM | POA: Diagnosis not present

## 2023-07-24 DIAGNOSIS — N182 Chronic kidney disease, stage 2 (mild): Secondary | ICD-10-CM | POA: Diagnosis not present

## 2023-07-24 DIAGNOSIS — E6609 Other obesity due to excess calories: Secondary | ICD-10-CM | POA: Diagnosis not present

## 2023-07-24 DIAGNOSIS — M797 Fibromyalgia: Secondary | ICD-10-CM | POA: Diagnosis not present

## 2023-07-24 DIAGNOSIS — M47814 Spondylosis without myelopathy or radiculopathy, thoracic region: Secondary | ICD-10-CM | POA: Diagnosis not present

## 2023-07-24 DIAGNOSIS — I10 Essential (primary) hypertension: Secondary | ICD-10-CM | POA: Diagnosis not present

## 2023-07-24 DIAGNOSIS — F17210 Nicotine dependence, cigarettes, uncomplicated: Secondary | ICD-10-CM | POA: Diagnosis not present

## 2023-07-25 ENCOUNTER — Other Ambulatory Visit: Payer: Self-pay | Admitting: Physician Assistant

## 2023-07-26 ENCOUNTER — Other Ambulatory Visit: Payer: Self-pay | Admitting: Physician Assistant

## 2023-07-26 DIAGNOSIS — F332 Major depressive disorder, recurrent severe without psychotic features: Secondary | ICD-10-CM

## 2023-07-30 ENCOUNTER — Encounter: Payer: Self-pay | Admitting: Physician Assistant

## 2023-07-30 ENCOUNTER — Ambulatory Visit (INDEPENDENT_AMBULATORY_CARE_PROVIDER_SITE_OTHER): Payer: Medicare Other | Admitting: Physician Assistant

## 2023-07-30 VITALS — BP 108/45 | HR 104 | Ht 68.0 in | Wt 216.0 lb

## 2023-07-30 DIAGNOSIS — F411 Generalized anxiety disorder: Secondary | ICD-10-CM

## 2023-07-30 DIAGNOSIS — R110 Nausea: Secondary | ICD-10-CM | POA: Diagnosis not present

## 2023-07-30 DIAGNOSIS — J432 Centrilobular emphysema: Secondary | ICD-10-CM

## 2023-07-30 DIAGNOSIS — F410 Panic disorder [episodic paroxysmal anxiety] without agoraphobia: Secondary | ICD-10-CM

## 2023-07-30 DIAGNOSIS — F5101 Primary insomnia: Secondary | ICD-10-CM | POA: Diagnosis not present

## 2023-07-30 DIAGNOSIS — G43909 Migraine, unspecified, not intractable, without status migrainosus: Secondary | ICD-10-CM | POA: Diagnosis not present

## 2023-07-30 DIAGNOSIS — I10 Essential (primary) hypertension: Secondary | ICD-10-CM

## 2023-07-30 DIAGNOSIS — G894 Chronic pain syndrome: Secondary | ICD-10-CM

## 2023-07-30 MED ORDER — INCRUSE ELLIPTA 62.5 MCG/ACT IN AEPB: 1.0000 | INHALATION_SPRAY | Freq: Every day | RESPIRATORY_TRACT | 3 refills | Status: DC

## 2023-07-30 MED ORDER — METOCLOPRAMIDE HCL 10 MG PO TABS
5.0000 mg | ORAL_TABLET | Freq: Three times a day (TID) | ORAL | 1 refills | Status: AC | PRN
Start: 2023-07-30 — End: ?

## 2023-07-30 MED ORDER — HYDROXYZINE HCL 50 MG PO TABS
50.0000 mg | ORAL_TABLET | Freq: Three times a day (TID) | ORAL | 0 refills | Status: DC | PRN
Start: 2023-07-30 — End: 2024-01-27

## 2023-07-30 MED ORDER — BUSPIRONE HCL 15 MG PO TABS
15.0000 mg | ORAL_TABLET | Freq: Two times a day (BID) | ORAL | 0 refills | Status: DC
Start: 2023-07-30 — End: 2024-01-27

## 2023-07-30 MED ORDER — CYCLOBENZAPRINE HCL 10 MG PO TABS: 10.0000 mg | ORAL_TABLET | Freq: Three times a day (TID) | ORAL | 1 refills | Status: DC | PRN

## 2023-07-30 MED ORDER — TOPIRAMATE 100 MG PO TABS: ORAL_TABLET | ORAL | 1 refills | Status: DC

## 2023-07-30 MED ORDER — ALPRAZOLAM 0.5 MG PO TABS
0.5000 mg | ORAL_TABLET | Freq: Every evening | ORAL | 5 refills | Status: DC | PRN
Start: 2023-07-30 — End: 2024-01-29

## 2023-07-30 MED ORDER — ZOLPIDEM TARTRATE 5 MG PO TABS
5.0000 mg | ORAL_TABLET | Freq: Every evening | ORAL | 1 refills | Status: DC | PRN
Start: 2023-07-30 — End: 2023-08-18

## 2023-07-30 MED ORDER — HYDROCHLOROTHIAZIDE 25 MG PO TABS
25.0000 mg | ORAL_TABLET | Freq: Every day | ORAL | 3 refills | Status: DC
Start: 2023-07-30 — End: 2024-07-22

## 2023-07-30 NOTE — Progress Notes (Unsigned)
Established Patient Office Visit  Subjective   Patient ID: Aimee Little, female    DOB: 1961/02/24  Age: 63 y.o. MRN: [id number]  Chief Complaint  Patient presents with   Medical Management of Chronic Issues    Medication refill     HPI Pt is a 62 yo female who presents to the clinic for medication refills and adjustments.   Her anxiety and panic attacks have been much worse over the last 2 weeks. No known reason. She did restart Vraylar because she did get relief last time she started it. It has not helped. She stopped it again. Jaynie Bream is not helping. She is requesting 10 xanax to take as needed. She is off belbuca. She is only on no more than 2 norco a day via pain clinic. She is not sleeping well and when she does not sleep well she gets more anxious. She has not had her Remus Loffler recently.   She needs refill on incruse. She continues to try to cut back on smoking. No concerns with breathing or shortness of breath today.   Needs refill on flexeril for chronic pain . Her migraines have increased some with her mood worsening.    .. Active Ambulatory Problems    Diagnosis Date Noted   HOT FLASHES 12/29/2009   DISC DISEASE, LUMBAR 12/29/2009   Fibromyalgia 01/01/2013   Vitamin D deficiency 05/16/2013   Migraine without status migrainosus, not intractable 07/21/2013   Chronic pain syndrome 11/05/2013   Anxiety 12/17/2013   Adenomatous colon polyp 12/20/2013   Class 1 obesity due to excess calories without serious comorbidity with body mass index (BMI) of 31.0 to 31.9 in adult 11/25/2014   Sinobronchitis 04/13/2015   Chronic fatigue 05/26/2015   RLS (restless legs syndrome) 09/22/2015   Hypothyroid 10/27/2015   Insomnia 11/21/2015   Severe episode of recurrent major depressive disorder, without psychotic features (HCC) 11/29/2015   Rhinitis, allergic 11/29/2015   Vaginal atrophy 01/02/2016   Generalized anxiety disorder 03/19/2016   Panic attacks 03/19/2016   Loose body in  knee 04/17/2016   Chondromalacia of patellofemoral joint 04/17/2016   Diverticulitis of colon without hemorrhage 07/19/2016   Diverticulitis 09/10/2017   Light tobacco smoker 10/01/2017   Essential hypertension 10/01/2017   DDD (degenerative disc disease), lumbar 04/01/2018   OSA on CPAP 05/29/2018   Chronic bilateral thoracic back pain 08/04/2018   Osteopenia 03/24/2019   Chronic bilateral low back pain with bilateral sciatica 12/13/2019   DDD (degenerative disc disease), thoracic 01/04/2020   Mixed hyperlipidemia 01/20/2020   Epigastric pain 09/01/2020   Bilateral lower extremity edema 09/01/2020   Abnormal ECG 11/20/2020   Hypertension goal BP (blood pressure) < 130/80 02/28/2021   Bruised rib 06/05/2021   Aortic atherosclerosis (HCC) 06/22/2021   Coronary artery calcification seen on CT scan 06/22/2021   Emphysema lung (HCC) 06/22/2021   Pulmonary nodules 06/22/2021   Chronic constipation 09/04/2021   Chronic bilateral low back pain without sciatica 02/04/2022   Positive colorectal cancer screening using Cologuard test 03/15/2022   Anxiety and depression 03/21/2022   Hypokalemia 03/21/2022   Petechiae 04/22/2022   Elevated hemoglobin (HCC) 05/31/2022   Chills (without fever) 05/31/2022   No energy 05/31/2022   Adjustment disorder with mixed anxiety and depressed mood 10/18/2022   Abnormal weight gain 10/18/2022   Current smoker 04/30/2023   Vitamin D deficiency 04/30/2023   Chronic pain of both knees 06/26/2023   URI with cough and congestion 07/10/2023   Resolved Ambulatory Problems  Diagnosis Date Noted   Hypothyroidism 12/29/2009   TOBACCO ABUSE 06/13/2010   GRIEF REACTION, ACUTE 12/29/2009   Depression 02/06/2010   Osteoarthrosis involving lower leg 12/29/2009   NAUSEA AND VOMITING 05/02/2010   Osteoarthritis of left knee 07/21/2013   Medication management 11/05/2013   Nausea with vomiting 04/07/2014   Lumbago 11/25/2014   Sacroiliac joint dysfunction of  both sides 09/06/2015   Bilateral low back pain without sciatica 09/06/2015   Right knee pain 11/07/2015   Left knee pain 01/23/2016   Abdominal pain, left lower quadrant 04/25/2016   Trochanteric bursitis of left hip 01/29/2017   Nonintractable headache 03/06/2017   Tachycardia 03/06/2017   Rib pain on left side 08/03/2017   Elevated blood pressure reading 10/01/2017   Rib pain on right side 11/04/2017   Traumatic hematoma of eyelid, right, initial encounter 11/04/2017   Low grade fever 04/01/2018   Acute bilateral low back pain with bilateral sciatica 04/01/2018   Closed fracture of one rib of right side 12/01/2018   Upper back pain 11/30/2019   Mid back pain 12/13/2019   Muscle spasm of back 12/13/2019   Chest pain 09/01/2020   Past Medical History:  Diagnosis Date   Migraine    Thyroid disease    Tobacco use      ROS See HPI.    Objective:     BP (!) 108/45   Pulse (!) 104   Ht 5\' 8"  (1.727 m)   Wt 216 lb (98 kg)   SpO2 99%   BMI 32.84 kg/m  BP Readings from Last 3 Encounters:  07/30/23 (!) 108/45  07/10/23 102/68  06/25/23 131/75   Wt Readings from Last 3 Encounters:  07/30/23 216 lb (98 kg)  07/10/23 223 lb (101.2 kg)  06/25/23 221 lb (100.2 kg)    .Marland Kitchen    07/30/2023    2:35 PM 04/30/2023   10:12 AM 03/07/2023   11:52 AM 01/24/2023    1:17 PM 11/26/2022    9:06 AM  Depression screen PHQ 2/9  Decreased Interest 2 2 2  0   Down, Depressed, Hopeless 3 2 3 1    PHQ - 2 Score 5 4 5 1    Altered sleeping 1 2 3     Tired, decreased energy 0 1 3    Change in appetite 0 0 2    Feeling bad or failure about yourself  1 0 1    Trouble concentrating 0 1 1    Moving slowly or fidgety/restless 1 1 2     Suicidal thoughts 1 0 0    PHQ-9 Score 9 9 17     Difficult doing work/chores Somewhat difficult Somewhat difficult Very difficult       Information is confidential and restricted. Go to Review Flowsheets to unlock data.   ..    07/30/2023    2:35 PM 04/30/2023    10:14 AM 03/07/2023   11:52 AM 10/16/2022    3:09 PM  GAD 7 : Generalized Anxiety Score  Nervous, Anxious, on Edge 3 1 2 3   Control/stop worrying 3 0 2 3  Worry too much - different things 1 0 2 3  Trouble relaxing 2 2 1 3   Restless 2 2 0 3  Easily annoyed or irritable 0 2 0 1  Afraid - awful might happen 0 2 0 3  Total GAD 7 Score 11 9 7 19   Anxiety Difficulty Very difficult Somewhat difficult Very difficult Very difficult      Physical Exam Constitutional:  Appearance: Normal appearance.  HENT:     Head: Normocephalic.  Cardiovascular:     Rate and Rhythm: Normal rate and regular rhythm.  Pulmonary:     Effort: Pulmonary effort is normal.  Musculoskeletal:     Right lower leg: No edema.     Left lower leg: No edema.  Neurological:     General: No focal deficit present.     Mental Status: She is alert and oriented to person, place, and time.  Psychiatric:     Comments: anxious          Assessment & Plan:  Marland KitchenMarland KitchenMelissa was seen today for medical management of chronic issues.  Diagnoses and all orders for this visit:  Panic attacks -     hydrOXYzine (ATARAX) 50 MG tablet; Take 1 tablet (50 mg total) by mouth 3 (three) times daily as needed for itching. -     busPIRone (BUSPAR) 15 MG tablet; Take 1 tablet (15 mg total) by mouth 2 (two) times daily. -     ALPRAZolam (XANAX) 0.5 MG tablet; Take 1 tablet (0.5 mg total) by mouth at bedtime as needed for anxiety.  Primary insomnia -     zolpidem (AMBIEN) 5 MG tablet; Take 1 tablet (5 mg total) by mouth at bedtime as needed for sleep.  Migraine without status migrainosus, not intractable, unspecified migraine type -     topiramate (TOPAMAX) 100 MG tablet; Take one tablet twice a day.  Nausea -     metoCLOPramide (REGLAN) 10 MG tablet; Take 0.5 tablets (5 mg total) by mouth every 8 (eight) hours as needed for nausea (nausea/reflux).  Centrilobular emphysema (HCC) -     umeclidinium bromide (INCRUSE ELLIPTA) 62.5  MCG/ACT AEPB; Inhale 1 puff into the lungs daily.  GAD (generalized anxiety disorder) -     busPIRone (BUSPAR) 15 MG tablet; Take 1 tablet (15 mg total) by mouth 2 (two) times daily.  Chronic pain syndrome -     cyclobenzaprine (FLEXERIL) 10 MG tablet; Take 1 tablet (10 mg total) by mouth 3 (three) times daily as needed for muscle spasms.  Primary hypertension -     hydrochlorothiazide (HYDRODIURIL) 25 MG tablet; Take 1 tablet (25 mg total) by mouth daily.   Refilled medications needed.  GAD and PHQ not controlled  Added buspar twice a day and hydroxyzine three times a day Take ambien 5mg  with trazodone 200mg  at bedtime for sleep 10 xanax to replace klonapin but not to be taken with norco Follow up in 4 weeks.    Return in about 4 weeks (around 08/27/2023).    Tandy Gaw, PA-C

## 2023-07-30 NOTE — Patient Instructions (Addendum)
Added buspar twice a day Hydroxyzine three times a day Ambien 5mg  to take with trazodone 200mg  for sleep

## 2023-08-05 ENCOUNTER — Encounter: Payer: Self-pay | Admitting: Physician Assistant

## 2023-08-05 ENCOUNTER — Other Ambulatory Visit: Payer: Self-pay | Admitting: Physician Assistant

## 2023-08-05 DIAGNOSIS — F410 Panic disorder [episodic paroxysmal anxiety] without agoraphobia: Secondary | ICD-10-CM

## 2023-08-18 ENCOUNTER — Encounter: Payer: Self-pay | Admitting: Physician Assistant

## 2023-08-18 ENCOUNTER — Encounter (HOSPITAL_COMMUNITY): Payer: Self-pay

## 2023-08-18 ENCOUNTER — Ambulatory Visit (HOSPITAL_COMMUNITY): Payer: Medicare Other | Admitting: Psychiatry

## 2023-08-18 ENCOUNTER — Ambulatory Visit (INDEPENDENT_AMBULATORY_CARE_PROVIDER_SITE_OTHER): Payer: Medicare Other | Admitting: Physician Assistant

## 2023-08-18 VITALS — BP 130/81 | HR 81 | Ht 68.0 in

## 2023-08-18 DIAGNOSIS — F5101 Primary insomnia: Secondary | ICD-10-CM | POA: Diagnosis not present

## 2023-08-18 DIAGNOSIS — F410 Panic disorder [episodic paroxysmal anxiety] without agoraphobia: Secondary | ICD-10-CM | POA: Diagnosis not present

## 2023-08-18 DIAGNOSIS — F411 Generalized anxiety disorder: Secondary | ICD-10-CM

## 2023-08-18 MED ORDER — QUETIAPINE FUMARATE 25 MG PO TABS: ORAL_TABLET | ORAL | 0 refills | Status: DC

## 2023-08-18 NOTE — Progress Notes (Signed)
Established Patient Office Visit  Subjective   Patient ID: Aimee Little, female    DOB: 22-Jul-1961  Age: 62 y.o. MRN: [id number]  Chief Complaint  Patient presents with   Medical Management of Chronic Issues    Mood , phq,gad     HPI 62 yo female presents today for follow up on depression, panic attacks, and trouble sleeping. She has been having trouble sleeping for the last 2 months. She reports sleeping for 1-2hr "naps" every few days.  She cannot fall asleep or stay asleep. She tried the Ambien + trazodone which worked for 3 days but stopped working. In the past, she tried Seroquel, which helped her sleep, but when she took it with gabapentin it made her have hallucinations. She is no longer taking gabapentin. Her lack of sleep has increased her panic attacks. She has tried getting into psychiatry but they only have virtual options and her phone is not compatible. She also tried quitting smoking cigarettes for 4 days which made her anxiety worse. She started smoking again. She expressed concerns about weight gain on Seroquel, but is willing to try it. She has cut down on caffeine to one cup of coffee in the morning. Patient states she does not feel depressed.   .. Active Ambulatory Problems    Diagnosis Date Noted   HOT FLASHES 12/29/2009   DISC DISEASE, LUMBAR 12/29/2009   Fibromyalgia 01/01/2013   Vitamin D deficiency 05/16/2013   Migraine without status migrainosus, not intractable 07/21/2013   Chronic pain syndrome 11/05/2013   Anxiety 12/17/2013   Adenomatous colon polyp 12/20/2013   Class 1 obesity due to excess calories without serious comorbidity with body mass index (BMI) of 31.0 to 31.9 in adult 11/25/2014   Sinobronchitis 04/13/2015   Chronic fatigue 05/26/2015   RLS (restless legs syndrome) 09/22/2015   Hypothyroid 10/27/2015   Insomnia 11/21/2015   Severe episode of recurrent major depressive disorder, without psychotic features (HCC) 11/29/2015   Rhinitis,  allergic 11/29/2015   Vaginal atrophy 01/02/2016   Generalized anxiety disorder 03/19/2016   Panic attacks 03/19/2016   Loose body in knee 04/17/2016   Chondromalacia of patellofemoral joint 04/17/2016   Diverticulitis of colon without hemorrhage 07/19/2016   Diverticulitis 09/10/2017   Light tobacco smoker 10/01/2017   Essential hypertension 10/01/2017   DDD (degenerative disc disease), lumbar 04/01/2018   OSA on CPAP 05/29/2018   Chronic bilateral thoracic back pain 08/04/2018   Osteopenia 03/24/2019   Chronic bilateral low back pain with bilateral sciatica 12/13/2019   DDD (degenerative disc disease), thoracic 01/04/2020   Mixed hyperlipidemia 01/20/2020   Epigastric pain 09/01/2020   Bilateral lower extremity edema 09/01/2020   Abnormal ECG 11/20/2020   Hypertension goal BP (blood pressure) < 130/80 02/28/2021   Bruised rib 06/05/2021   Aortic atherosclerosis (HCC) 06/22/2021   Coronary artery calcification seen on CT scan 06/22/2021   Emphysema lung (HCC) 06/22/2021   Pulmonary nodules 06/22/2021   Chronic constipation 09/04/2021   Chronic bilateral low back pain without sciatica 02/04/2022   Positive colorectal cancer screening using Cologuard test 03/15/2022   Anxiety and depression 03/21/2022   Hypokalemia 03/21/2022   Petechiae 04/22/2022   Elevated hemoglobin (HCC) 05/31/2022   Chills (without fever) 05/31/2022   No energy 05/31/2022   Adjustment disorder with mixed anxiety and depressed mood 10/18/2022   Abnormal weight gain 10/18/2022   Current smoker 04/30/2023   Vitamin D deficiency 04/30/2023   Chronic pain of both knees 06/26/2023   URI with  cough and congestion 07/10/2023   Resolved Ambulatory Problems    Diagnosis Date Noted   Hypothyroidism 12/29/2009   TOBACCO ABUSE 06/13/2010   GRIEF REACTION, ACUTE 12/29/2009   Depression 02/06/2010   Osteoarthrosis involving lower leg 12/29/2009   NAUSEA AND VOMITING 05/02/2010   Osteoarthritis of left knee  07/21/2013   Medication management 11/05/2013   Nausea with vomiting 04/07/2014   Lumbago 11/25/2014   Sacroiliac joint dysfunction of both sides 09/06/2015   Bilateral low back pain without sciatica 09/06/2015   Right knee pain 11/07/2015   Left knee pain 01/23/2016   Abdominal pain, left lower quadrant 04/25/2016   Trochanteric bursitis of left hip 01/29/2017   Nonintractable headache 03/06/2017   Tachycardia 03/06/2017   Rib pain on left side 08/03/2017   Elevated blood pressure reading 10/01/2017   Rib pain on right side 11/04/2017   Traumatic hematoma of eyelid, right, initial encounter 11/04/2017   Low grade fever 04/01/2018   Acute bilateral low back pain with bilateral sciatica 04/01/2018   Closed fracture of one rib of right side 12/01/2018   Upper back pain 11/30/2019   Mid back pain 12/13/2019   Muscle spasm of back 12/13/2019   Chest pain 09/01/2020   Past Medical History:  Diagnosis Date   Migraine    Thyroid disease    Tobacco use       Review of Systems  Psychiatric/Behavioral:  The patient is nervous/anxious and has insomnia.       Objective:     BP 130/81   Pulse 81   Ht 5\' 8"  (1.727 m)   SpO2 99%   BMI 32.84 kg/m   Physical Exam:  NSR Normal breathing Anxiousness and easily tearful when talking about not sleeping      08/18/2023    8:25 AM 07/30/2023    2:35 PM 04/30/2023   10:12 AM 03/07/2023   11:52 AM 01/24/2023    1:17 PM  Depression screen PHQ 2/9  Decreased Interest 2 2 2 2  0  Down, Depressed, Hopeless 1 3 2 3 1   PHQ - 2 Score 3 5 4 5 1   Altered sleeping 3 1 2 3    Tired, decreased energy 3 0 1 3   Change in appetite 1 0 0 2   Feeling bad or failure about yourself  0 1 0 1   Trouble concentrating 3 0 1 1   Moving slowly or fidgety/restless 1 1 1 2    Suicidal thoughts 0 1 0 0   PHQ-9 Score 14 9 9 17    Difficult doing work/chores Extremely dIfficult Somewhat difficult Somewhat difficult Very difficult    ..    08/18/2023     8:26 AM 07/30/2023    2:35 PM 04/30/2023   10:14 AM 03/07/2023   11:52 AM  GAD 7 : Generalized Anxiety Score  Nervous, Anxious, on Edge 3 3 1 2   Control/stop worrying 1 3 0 2  Worry too much - different things 1 1 0 2  Trouble relaxing 3 2 2 1   Restless 3 2 2  0  Easily annoyed or irritable 0 0 2 0  Afraid - awful might happen 0 0 2 0  Total GAD 7 Score 11 11 9 7   Anxiety Difficulty Very difficult Very difficult Somewhat difficult Very difficult        Assessment & Plan:  Marland KitchenMarland KitchenMelissa was seen today for medical management of chronic issues.  Diagnoses and all orders for this visit:  Primary insomnia -  QUEtiapine (SEROQUEL) 25 MG tablet; Take one tablet at bedtime may increase to 2 tablets after 3 days if not sleeping.  Panic attacks  GAD (generalized anxiety disorder)   GAD and PHQ not controlled. Started Seroquel. Discussed with patient to not take trazodone, Ambien, pain medications, or benzo with Seroquel.  Advised patient to take 25 mg once a night for 3 days for sleep. If sleep does not improve, increase to 50 mg per night.  Advised patient to call the office if the 50 mg does not help sleep in 3 days. Follow up in 4 wks.   Return in about 4 weeks (around 09/15/2023).    Tandy Gaw, PA-C

## 2023-08-18 NOTE — Patient Instructions (Signed)
Start seroquel 25mg  in 3 days increase to 50mg  if needed.

## 2023-08-22 ENCOUNTER — Other Ambulatory Visit: Payer: Self-pay | Admitting: Physician Assistant

## 2023-08-22 DIAGNOSIS — E039 Hypothyroidism, unspecified: Secondary | ICD-10-CM

## 2023-08-25 ENCOUNTER — Telehealth (HOSPITAL_COMMUNITY): Payer: Medicare Other | Admitting: Psychiatry

## 2023-08-25 DIAGNOSIS — Z6832 Body mass index (BMI) 32.0-32.9, adult: Secondary | ICD-10-CM | POA: Diagnosis not present

## 2023-08-25 DIAGNOSIS — Z79899 Other long term (current) drug therapy: Secondary | ICD-10-CM | POA: Diagnosis not present

## 2023-08-25 DIAGNOSIS — M545 Low back pain, unspecified: Secondary | ICD-10-CM | POA: Diagnosis not present

## 2023-08-25 DIAGNOSIS — I10 Essential (primary) hypertension: Secondary | ICD-10-CM | POA: Diagnosis not present

## 2023-08-25 DIAGNOSIS — G8929 Other chronic pain: Secondary | ICD-10-CM | POA: Diagnosis not present

## 2023-08-25 DIAGNOSIS — M797 Fibromyalgia: Secondary | ICD-10-CM | POA: Diagnosis not present

## 2023-08-25 DIAGNOSIS — M47814 Spondylosis without myelopathy or radiculopathy, thoracic region: Secondary | ICD-10-CM | POA: Diagnosis not present

## 2023-08-25 DIAGNOSIS — E6609 Other obesity due to excess calories: Secondary | ICD-10-CM | POA: Diagnosis not present

## 2023-10-03 ENCOUNTER — Other Ambulatory Visit: Payer: Self-pay | Admitting: Physician Assistant

## 2023-10-03 DIAGNOSIS — F5101 Primary insomnia: Secondary | ICD-10-CM

## 2023-10-07 MED ORDER — MELOXICAM 15 MG PO TABS
15.0000 mg | ORAL_TABLET | Freq: Every day | ORAL | 1 refills | Status: DC
Start: 2023-10-07 — End: 2024-03-30

## 2023-10-25 ENCOUNTER — Other Ambulatory Visit: Payer: Self-pay | Admitting: Physician Assistant

## 2023-10-25 DIAGNOSIS — I10 Essential (primary) hypertension: Secondary | ICD-10-CM

## 2023-11-25 ENCOUNTER — Other Ambulatory Visit: Payer: Self-pay | Admitting: Physician Assistant

## 2023-11-25 DIAGNOSIS — E039 Hypothyroidism, unspecified: Secondary | ICD-10-CM

## 2023-11-26 ENCOUNTER — Other Ambulatory Visit: Payer: Self-pay | Admitting: Physician Assistant

## 2023-11-26 DIAGNOSIS — E039 Hypothyroidism, unspecified: Secondary | ICD-10-CM

## 2023-11-26 MED ORDER — LEVOTHYROXINE SODIUM 150 MCG PO TABS: ORAL_TABLET | ORAL | 0 refills | Status: DC

## 2023-11-26 NOTE — Telephone Encounter (Signed)
 Copied from CRM [id number]. Topic: Clinical - Medication Refill >> Nov 26, 2023  8:46 AM Gildardo Pounds wrote: Most Recent Primary Care Visit:  Provider: Jomarie Longs  Department: [facility]  Visit Type: OFFICE VISIT  Date: 08/18/2023  Medication: levothyroxine (SYNTHROID) 150 MCG tablet  Has the patient contacted their pharmacy? Yes (Agent: If no, request that the patient contact the pharmacy for the refill. If patient does not wish to contact the pharmacy document the reason why and proceed with request.) (Agent: If yes, when and what did the pharmacy advise?)  Is this the correct pharmacy for this prescription? Yes If no, delete pharmacy and type the correct one.  This is the patient's preferred pharmacy:  [facility] [place], [place] - [address] [place] [address] [place] [address] Phone: [phone] Fax: [phone]   Has the prescription been filled recently? No  Is the patient out of the medication? Yes  Has the patient been seen for an appointment in the last year OR does the patient have an upcoming appointment? Yes  Can we respond through MyChart? Yes  Agent: Please be advised that Rx refills may take up to 3 business days. We ask that you follow-up with your pharmacy.

## 2023-11-26 NOTE — Telephone Encounter (Signed)
Prescription refill sent today

## 2024-01-09 ENCOUNTER — Other Ambulatory Visit: Payer: Self-pay | Admitting: Physician Assistant

## 2024-01-09 DIAGNOSIS — F5101 Primary insomnia: Secondary | ICD-10-CM

## 2024-01-19 ENCOUNTER — Other Ambulatory Visit: Payer: Self-pay | Admitting: Physician Assistant

## 2024-01-19 DIAGNOSIS — Z13820 Encounter for screening for osteoporosis: Secondary | ICD-10-CM

## 2024-01-21 ENCOUNTER — Ambulatory Visit: Admitting: Physician Assistant

## 2024-01-23 ENCOUNTER — Other Ambulatory Visit: Payer: Self-pay | Admitting: Physician Assistant

## 2024-01-23 DIAGNOSIS — F332 Major depressive disorder, recurrent severe without psychotic features: Secondary | ICD-10-CM

## 2024-01-23 NOTE — Telephone Encounter (Signed)
 Requesting rx [id number]  Last written 07/19/2023 Last OV 08/18/2023 Upcoming appt 01/27/2024

## 2024-01-27 ENCOUNTER — Ambulatory Visit (INDEPENDENT_AMBULATORY_CARE_PROVIDER_SITE_OTHER): Admitting: Physician Assistant

## 2024-01-27 VITALS — BP 140/44

## 2024-01-27 DIAGNOSIS — Z1231 Encounter for screening mammogram for malignant neoplasm of breast: Secondary | ICD-10-CM

## 2024-01-27 DIAGNOSIS — G2581 Restless legs syndrome: Secondary | ICD-10-CM

## 2024-01-27 DIAGNOSIS — G43909 Migraine, unspecified, not intractable, without status migrainosus: Secondary | ICD-10-CM

## 2024-01-27 DIAGNOSIS — F5101 Primary insomnia: Secondary | ICD-10-CM

## 2024-01-27 DIAGNOSIS — I10 Essential (primary) hypertension: Secondary | ICD-10-CM | POA: Diagnosis not present

## 2024-01-27 DIAGNOSIS — F331 Major depressive disorder, recurrent, moderate: Secondary | ICD-10-CM

## 2024-01-27 DIAGNOSIS — F419 Anxiety disorder, unspecified: Secondary | ICD-10-CM

## 2024-01-27 MED ORDER — AMLODIPINE BESYLATE 10 MG PO TABS: 10.0000 mg | ORAL_TABLET | Freq: Every day | ORAL | 1 refills | Status: DC

## 2024-01-27 MED ORDER — TRAZODONE HCL 100 MG PO TABS: 100.0000 mg | ORAL_TABLET | Freq: Every day | ORAL | 1 refills | Status: DC

## 2024-01-27 MED ORDER — TOPIRAMATE 100 MG PO TABS: ORAL_TABLET | ORAL | 1 refills | Status: AC

## 2024-01-27 MED ORDER — ROPINIROLE HCL 1 MG PO TABS: 1.0000 mg | ORAL_TABLET | Freq: Every day | ORAL | 1 refills | Status: DC

## 2024-01-27 MED ORDER — RIZATRIPTAN BENZOATE 10 MG PO TBDP: ORAL_TABLET | ORAL | 1 refills | Status: AC

## 2024-01-27 MED ORDER — ZOLPIDEM TARTRATE 5 MG PO TABS: 5.0000 mg | ORAL_TABLET | Freq: Every evening | ORAL | 1 refills | Status: DC | PRN

## 2024-01-27 MED ORDER — CARIPRAZINE HCL 1.5 MG PO CAPS
1.5000 mg | ORAL_CAPSULE | Freq: Every day | ORAL | 1 refills | Status: AC
Start: 2024-01-27 — End: ?

## 2024-01-28 ENCOUNTER — Other Ambulatory Visit: Payer: Self-pay | Admitting: Physician Assistant

## 2024-01-28 DIAGNOSIS — F410 Panic disorder [episodic paroxysmal anxiety] without agoraphobia: Secondary | ICD-10-CM

## 2024-01-29 ENCOUNTER — Telehealth: Payer: Self-pay

## 2024-01-29 ENCOUNTER — Ambulatory Visit: Payer: Self-pay

## 2024-01-29 DIAGNOSIS — F410 Panic disorder [episodic paroxysmal anxiety] without agoraphobia: Secondary | ICD-10-CM

## 2024-01-29 MED ORDER — ALPRAZOLAM 0.5 MG PO TABS: ORAL_TABLET | ORAL | 2 refills | Status: DC

## 2024-01-29 MED ORDER — ALBUTEROL SULFATE HFA 108 (90 BASE) MCG/ACT IN AERS: 2.0000 | INHALATION_SPRAY | Freq: Four times a day (QID) | RESPIRATORY_TRACT | 0 refills | Status: AC | PRN

## 2024-01-29 MED ORDER — ALPRAZOLAM 0.5 MG PO TABS: 0.5000 mg | ORAL_TABLET | Freq: Every evening | ORAL | 5 refills | Status: DC | PRN

## 2024-01-29 NOTE — Telephone Encounter (Signed)
  Chief Complaint: medication refill Symptoms: anxiety Disposition: [] ED /[] Urgent Care (no appt availability in office) / [] Appointment(In office/virtual)/ []  [facility]/ [] Home Care/ [] Refused Recommended Disposition /[] [place]/ [x]  Follow-up with PCP Additional Notes: Patient states her alprazolam  expired 01/26/24 and she had called in for a refill. Refill encounter in from yesterday and patient seen for office visit on 01/27/24.  Copied from CRM [id number]. Topic: Clinical - Red Word Triage >> Jan 29, 2024 12:41 PM Aimee Little wrote: Red Word that prompted transfer to Nurse Triage: Called the office and said aprozolam is approved. Pharmacy says they never received the approved it. ALPRAZolam  (XANAX ) 0.5 MG tablet Panic attacks the last few days. Reason for Disposition  Caller requesting a CONTROLLED substance prescription refill (e.g., narcotics, ADHD medicines)  Answer Assessment - Initial Assessment Questions 1. DRUG NAME: "What medicine do you need to have refilled?"     Alprazolam   2. REFILLS REMAINING: "How many refills are remaining?" (Note: The label on the medicine or pill bottle will show how many refills are remaining. If there are no refills remaining, then a renewal may be needed.)     She states there were refills remaining but it expired.  3. EXPIRATION DATE: "What is the expiration date?" (Note: The label states when the prescription will expire, and thus can no longer be refilled.)     01/26/24.  4. PRESCRIBING HCP: "Who prescribed it?" Reason: If prescribed by specialist, call should be referred to that group.     Little, Aimee.  5. SYMPTOMS: "Do you have any symptoms?"     Severe anxiety. Patient states she has been talking to her sister as her support system, staying inside and putting a cold washcloth on her face. She states she has been trying to calm herself down.  6. PREGNANCY: "Is there any chance that you are pregnant?" "When was your last  menstrual period?"     N/A.  Protocols used: Medication Refill and Renewal Call-A-AH

## 2024-01-29 NOTE — Telephone Encounter (Signed)
 Copied from CRM [id number]. Topic: Clinical - Medication Question >> Jan 29, 2024 10:53 AM [name] H wrote: Reason for CRM: Patient called in and stated that the provider was suppose too sending in a rx to the pharmacy for albuterol  and this would be the patients first time having one, patients callback number is [phone]

## 2024-01-29 NOTE — Addendum Note (Signed)
 Addended by: Araceli Knight on: 01/29/2024 03:29 PM   Modules accepted: Orders

## 2024-01-29 NOTE — Addendum Note (Signed)
 Addended by: Araceli Knight on: 01/29/2024 03:58 PM   Modules accepted: Orders

## 2024-01-29 NOTE — Telephone Encounter (Signed)
 Called [facility] and notified staff of patient's medication request.

## 2024-01-29 NOTE — Telephone Encounter (Signed)
 Copied from CRM [id number]. Topic: Clinical - Medication Question >> Jan 29, 2024 10:53 AM [name] H wrote: Reason for CRM: Patient called in and stated that the provider was suppose too sending in a rx to the pharmacy for   ALPRAZolam  (XANAX ) 0.5 MG tablet  and this would be the patients first time having one, patients callback number is [phone]

## 2024-01-30 NOTE — Telephone Encounter (Signed)
 Patient informed of prescriptions being at pharmacy

## 2024-01-30 NOTE — Telephone Encounter (Signed)
 Per Merian Stamps xanax  prescriptions have been shredded.

## 2024-01-30 NOTE — Telephone Encounter (Signed)
 This request has been handled. No further action is required. Per patient, she has the medication on hand.

## 2024-02-02 ENCOUNTER — Encounter: Payer: Self-pay | Admitting: Physician Assistant

## 2024-02-02 NOTE — Progress Notes (Signed)
 Established Patient Office Visit  Subjective   Patient ID: Aimee Little, female    DOB: 1961-06-30  Age: 63 y.o. MRN: [id number]  Chief Complaint  Patient presents with   Medical Management of Chronic Issues    Med check ,hemorrhoids have resolved wants to about sleep medications     HPI Pt is a 63 yo female with MDD, Anxiety, Migraines, HTN, RLS who presents to the clinic for medication follow up.   Migraines are doing great with no migraine days in last 3 months.   Sleep is doing well if she takes trazodone  and ambien . Seroquel  worked but caused her to gain weight. She does not want to gain weight.   She denies any CP, palpitations, headaches or vision changes.   Her mood is more depressed. She admits he is not been taking vraylar . She uses xanax  sparingly as needed.    ROS See HPI.    Objective:     BP (!) 140/44  BP Readings from Last 3 Encounters:  01/27/24 (!) 140/44  08/18/23 130/81  07/30/23 (!) 108/45   Wt Readings from Last 3 Encounters:  07/30/23 216 lb (98 kg)  07/10/23 223 lb (101.2 kg)  06/25/23 221 lb (100.2 kg)      Physical Exam Constitutional:      Appearance: Normal appearance.  HENT:     Head: Normocephalic.  Cardiovascular:     Rate and Rhythm: Normal rate and regular rhythm.  Pulmonary:     Effort: Pulmonary effort is normal.     Breath sounds: Normal breath sounds.  Neurological:     General: No focal deficit present.     Mental Status: She is alert and oriented to person, place, and time.  Psychiatric:     Comments: anxious    ..    02/02/2024   12:57 AM 08/18/2023    8:25 AM 07/30/2023    2:35 PM 04/30/2023   10:12 AM 03/07/2023   11:52 AM  Depression screen PHQ 2/9  Decreased Interest 2 2 2 2 2   Down, Depressed, Hopeless 2 1 3 2 3   PHQ - 2 Score 4 3 5 4 5   Altered sleeping 2 3 1 2 3   Tired, decreased energy 3 3 0 1 3  Change in appetite 2 1 0 0 2  Feeling bad or failure about yourself  2 0 1 0 1  Trouble  concentrating 1 3 0 1 1  Moving slowly or fidgety/restless 1 1 1 1 2   Suicidal thoughts 0 0 1 0 0  PHQ-9 Score 15 14 9 9 17   Difficult doing work/chores Very difficult Extremely dIfficult Somewhat difficult Somewhat difficult Very difficult   ..    02/02/2024   12:57 AM 08/18/2023    8:26 AM 07/30/2023    2:35 PM 04/30/2023   10:14 AM  GAD 7 : Generalized Anxiety Score  Nervous, Anxious, on Edge 2 3 3 1   Control/stop worrying 2 1 3  0  Worry too much - different things 2 1 1  0  Trouble relaxing 2 3 2 2   Restless 2 3 2 2   Easily annoyed or irritable 2 0 0 2  Afraid - awful might happen 2 0 0 2  Total GAD 7 Score 14 11 11 9   Anxiety Difficulty Very difficult Very difficult Very difficult Somewhat difficult          Assessment & Plan:  Aaron AasAaron AasMelissa was seen today for medical management of chronic issues.  Diagnoses and  all orders for this visit:  Moderate episode of recurrent major depressive disorder (HCC) -     cariprazine  (VRAYLAR ) 1.5 MG capsule; Take 1 capsule (1.5 mg total) by mouth daily.  Migraine without status migrainosus, not intractable, unspecified migraine type -     topiramate  (TOPAMAX ) 100 MG tablet; Take one tablet twice a day. -     rizatriptan  (MAXALT -MLT) 10 MG disintegrating tablet; May repeat in 2 hours if needed for migraine  RLS (restless legs syndrome) -     rOPINIRole  (REQUIP ) 1 MG tablet; Take 1 tablet (1 mg total) by mouth at bedtime.  Hypertension goal BP (blood pressure) < 130/80 -     amLODipine  (NORVASC ) 10 MG tablet; Take 1 tablet (10 mg total) by mouth daily.  Visit for screening mammogram -     MM 3D SCREENING MAMMOGRAM BILATERAL BREAST; Future  Primary insomnia -     traZODone  (DESYREL ) 100 MG tablet; Take 1 tablet (100 mg total) by mouth at bedtime. -     zolpidem  (AMBIEN ) 5 MG tablet; Take 1 tablet (5 mg total) by mouth at bedtime as needed for sleep.  Anxiety   PHQ and GAD not to goal Added Vraylar  which has been helpful in the  past but she has stopped it when better Encouraged her to stay on it Trazodone  and ambien  for sleep Xanax  for as needed panic attacks and anxiety, reminded NOT to take with norco Migraine medications refilled Norvasc  for BP refilled   Mammogram ordered and encouraged to schedule AMW visit Declined smoking cessation   Return in about 3 months (around 04/27/2024) for Needs to schedule Medicare Wellness Exam.    Sandy Crumb, PA-C

## 2024-02-19 ENCOUNTER — Ambulatory Visit

## 2024-02-26 ENCOUNTER — Other Ambulatory Visit: Payer: Self-pay | Admitting: Physician Assistant

## 2024-02-26 DIAGNOSIS — E039 Hypothyroidism, unspecified: Secondary | ICD-10-CM

## 2024-03-01 ENCOUNTER — Encounter: Payer: Self-pay | Admitting: Sports Medicine

## 2024-03-01 ENCOUNTER — Ambulatory Visit (INDEPENDENT_AMBULATORY_CARE_PROVIDER_SITE_OTHER): Admitting: Sports Medicine

## 2024-03-01 ENCOUNTER — Ambulatory Visit: Payer: Self-pay

## 2024-03-01 VITALS — BP 121/73 | HR 94 | Temp 98.6°F | Resp 20 | Ht 68.0 in

## 2024-03-01 DIAGNOSIS — J329 Chronic sinusitis, unspecified: Secondary | ICD-10-CM | POA: Diagnosis not present

## 2024-03-01 DIAGNOSIS — J40 Bronchitis, not specified as acute or chronic: Secondary | ICD-10-CM | POA: Diagnosis not present

## 2024-03-01 DIAGNOSIS — R6889 Other general symptoms and signs: Secondary | ICD-10-CM | POA: Diagnosis not present

## 2024-03-01 MED ORDER — FLUCONAZOLE 150 MG PO TABS: 150.0000 mg | ORAL_TABLET | Freq: Once | ORAL | 0 refills | Status: AC

## 2024-03-01 MED ORDER — PREDNISONE 50 MG PO TABS: ORAL_TABLET | ORAL | 0 refills | Status: DC

## 2024-03-01 MED ORDER — AZITHROMYCIN 250 MG PO TABS: ORAL_TABLET | ORAL | 0 refills | Status: DC

## 2024-03-01 NOTE — Assessment & Plan Note (Addendum)
 63 year old female, history of emphysema, we treated her back in 2022 for what appeared to be a sinobronchitis, she is now having increasing facial pain and pressure, runny nose, no cough today. No shortness of breath, no fevers or chills but she does have profound fatigue. She responded well to prednisone  and azithromycin  in the past, she does tell me that she does not want Augmentin  because the pills are too big to swallow. On exam she feels clammy, she appears uncomfortable, she does have some photophobia. No focal neurologic signs or symptoms. Oropharynx, nasopharynx, ear canals unremarkable, she has tenderness to the frontal and maxillary sinuses. COVID flu testing is negative today, adding azithromycin  and prednisone , she gets yeast infections with abx so adding Diflucan  as well, return to see me as needed.

## 2024-03-01 NOTE — Progress Notes (Signed)
    Procedures performed today:    None.  Independent interpretation of notes and tests performed by another provider:   None.  Brief History, Exam, Impression, and Recommendations:    Sinobronchitis 63 year old female, history of emphysema, we treated her back in 2022 for what appeared to be a sinobronchitis, she is now having increasing facial pain and pressure, runny nose, no cough today. No shortness of breath, no fevers or chills but she does have profound fatigue. She responded well to prednisone  and azithromycin  in the past, she does tell me that she does not want Augmentin  because the pills are too big to swallow. On exam she feels clammy, she appears uncomfortable, she does have some photophobia. No focal neurologic signs or symptoms. Oropharynx, nasopharynx, ear canals unremarkable, she has tenderness to the frontal and maxillary sinuses. COVID flu testing is negative today, adding azithromycin  and prednisone , she gets yeast infections with abx so adding Diflucan  as well, return to see me as needed.  I spent 30 minutes of total time managing this patient today, this includes chart review, face to face, and non-face to face time.  ____________________________________________ Joselyn Nicely. Sandy Crumb, M.D., ABFM., CAQSM., AME. Primary Care and Sports Medicine [facility]  Adjunct Professor of [facility] Medicine  [employer]  Restaurant manager, fast food

## 2024-03-01 NOTE — Telephone Encounter (Signed)
 FYI - patient is scheduled to see Dr. Elva Hamburger at 4 pm today for her symptoms.

## 2024-03-01 NOTE — Telephone Encounter (Signed)
   Chief Complaint: sinus headache/migraine  Disposition: [] ED /[] Urgent Care (no appt availability in office) / [x] Appointment(In office/virtual)/ []  [place] [facility]/ [] Home Care/ [] Refused Recommended Disposition /[]  Mobile Bus/ []  Follow-up with PCP Additional Notes: Pt calling with "sinus induced migraine that started 7 days ago. Pt has been taking OTC and not getting much relief. Pt is crying out in pain. RN called [facility] and spoke with Almira Armour who scheduled pt with Dr T today 1600.           Copied from CRM [id number]. Topic: Clinical - Red Word Triage >> Mar 01, 2024 10:03 AM Marissa P wrote: Red Word that prompted transfer to Nurse Triage: Patient is experiencing some head pain and some sinus pressure. Would like to get scheduled as soon as possible with doctor Vista Grinder please. Reason for Disposition  [1] SEVERE headache (e.g., excruciating) AND [2] not improved after 2 hours of pain medicine  Answer Assessment - Initial Assessment Questions 1. LOCATION: "Where does it hurt?"      Across forehead  2. ONSET: "When did the headache start?" (Minutes, hours or days)      7 days  3. PATTERN: "Does the pain come and go, or has it been constant since it started?"     Constant  4. SEVERITY: "How bad is the pain?" and "What does it keep you from doing?"  (e.g., Scale 1-10; mild, moderate, or severe)   - MILD (1-3): doesn't interfere with normal activities    - MODERATE (4-7): interferes with normal activities or awakens from sleep    - SEVERE (8-10): excruciating pain, unable to do any normal activities        Severe 8 5. RECURRENT SYMPTOM: "Have you ever had headaches before?" If Yes, ask: "When was the last time?" and "What happened that time?"      Yes  6. CAUSE: "What do you think is causing the headache?"     Sinus pressure  7. MIGRAINE: "Have you been diagnosed with migraine headaches?" If Yes, ask: "Is this headache similar?"      Yes  8. HEAD INJURY: "Has  there been any recent injury to the head?"      na 9. OTHER SYMPTOMS: "Do you have any other symptoms?" (fever, stiff neck, eye pain, sore throat, cold symptoms)     Face hurts, sinus pressure  Protocols used: Headache-A-AH

## 2024-03-02 LAB — POC COVID19 BINAXNOW: SARS Coronavirus 2 Ag: NEGATIVE

## 2024-03-02 LAB — POCT INFLUENZA A/B
Influenza A, POC: NEGATIVE
Influenza B, POC: NEGATIVE

## 2024-03-04 ENCOUNTER — Ambulatory Visit

## 2024-03-24 ENCOUNTER — Ambulatory Visit

## 2024-03-26 ENCOUNTER — Other Ambulatory Visit: Payer: Self-pay | Admitting: Physician Assistant

## 2024-03-31 ENCOUNTER — Ambulatory Visit: Payer: Self-pay

## 2024-03-31 ENCOUNTER — Other Ambulatory Visit: Payer: Self-pay | Admitting: Physician Assistant

## 2024-03-31 DIAGNOSIS — R1032 Left lower quadrant pain: Secondary | ICD-10-CM

## 2024-03-31 NOTE — Telephone Encounter (Signed)
 FYI Only or Action Required?: Action required by provider: request for appointment.  Patient was last seen in primary care on 03/01/2024 by Curtis Debby PARAS, MD. Called Nurse Triage reporting Urine Output. Symptoms began several months ago. Interventions attempted: Nothing. Symptoms are: unchanged.  Triage Disposition: See Physician Within 24 Hours  Patient/caregiver understands and will follow disposition?: yes  Copied from CRM [id number]. Topic: Clinical - Red Word Triage >> Mar 31, 2024 10:25 AM Zane F wrote: [facility] that prompted transfer to Nurse Triage: dizziness Reason for Disposition  Urinating more frequently than usual (i.e., frequency)  Answer Assessment - Initial Assessment Questions 1. SYMPTOM: What's the main symptom you're concerned about? (e.g., frequency, incontinence)     Urgency, but then when she goes only dribbles come out 2. ONSET: When did the  urgency  start?     2 months 3. PAIN: Is there any pain? If Yes, ask: How bad is it? (Scale: 1-10; mild, moderate, severe)     denies 4. CAUSE: What do you think is causing the symptoms?     Pt states that she has taken numerous pain meds for a very long time, states that she is worried her kidneys are damaged 5. OTHER SYMPTOMS: Do you have any other symptoms? (e.g., blood in urine, fever, flank pain, pain with urination)     Denies  Pt states about a month ago she had diaphoresis and syncope. Denies dizziness at time of call. Pt states that she felt it was her blood sugar at the time. Pt denies hx of UTIs, states she has not been checked recently for a UTI.  Protocols used: Urinary Symptoms-A-AH

## 2024-04-05 ENCOUNTER — Ambulatory Visit (INDEPENDENT_AMBULATORY_CARE_PROVIDER_SITE_OTHER): Admitting: Physician Assistant

## 2024-04-05 ENCOUNTER — Encounter: Payer: Self-pay | Admitting: Physician Assistant

## 2024-04-05 VITALS — BP 135/73 | HR 88 | Temp 99.7°F | Resp 18 | Ht 68.0 in | Wt 237.0 lb

## 2024-04-05 DIAGNOSIS — R300 Dysuria: Secondary | ICD-10-CM | POA: Diagnosis not present

## 2024-04-05 DIAGNOSIS — Z6831 Body mass index (BMI) 31.0-31.9, adult: Secondary | ICD-10-CM

## 2024-04-05 DIAGNOSIS — E66811 Obesity, class 1: Secondary | ICD-10-CM

## 2024-04-05 DIAGNOSIS — R7301 Impaired fasting glucose: Secondary | ICD-10-CM

## 2024-04-05 DIAGNOSIS — R635 Abnormal weight gain: Secondary | ICD-10-CM

## 2024-04-05 DIAGNOSIS — E559 Vitamin D deficiency, unspecified: Secondary | ICD-10-CM

## 2024-04-05 DIAGNOSIS — I10 Essential (primary) hypertension: Secondary | ICD-10-CM | POA: Diagnosis not present

## 2024-04-05 DIAGNOSIS — R339 Retention of urine, unspecified: Secondary | ICD-10-CM | POA: Diagnosis not present

## 2024-04-05 DIAGNOSIS — R600 Localized edema: Secondary | ICD-10-CM

## 2024-04-05 DIAGNOSIS — E039 Hypothyroidism, unspecified: Secondary | ICD-10-CM

## 2024-04-05 LAB — POCT URINALYSIS DIP (CLINITEK)
Bilirubin, UA: NEGATIVE
Blood, UA: NEGATIVE
Glucose, UA: NEGATIVE mg/dL
Ketones, POC UA: NEGATIVE mg/dL
Nitrite, UA: NEGATIVE
POC PROTEIN,UA: NEGATIVE
Spec Grav, UA: 1.01 (ref 1.010–1.025)
Urobilinogen, UA: 0.2 U/dL
pH, UA: 7.5 (ref 5.0–8.0)

## 2024-04-05 MED ORDER — FUROSEMIDE 20 MG PO TABS: ORAL_TABLET | ORAL | 0 refills | Status: AC

## 2024-04-05 NOTE — Progress Notes (Signed)
 Established Patient Office Visit  Subjective   Patient ID: Aimee Little, female    DOB: 09/16/1961  Age: 63 y.o. MRN: [id number]  Chief Complaint  Patient presents with   Dysuria   Aimee Little presents today with concerns of inability to empty her bladder and feeling bloated. Patient states she has had a history of lower back pain, in which she had received trigger point injections (x3) in her back last week and was started on a Prednisone  taper for 5 days. She states that she has gained 10lbs since starting the prednisone , feelings like she is holding onto water and feeling bloated and endorses having bilateral LL edema. Patient expresses concern over being unable to lose weight despite attempting to fast or start dieting. She also feels as though her Synthroid  dose is no longer working. Patient also endorses two episodes of dizziness/ lightheadedness and a near-syncopal episode 3 days ago, which she attributes to possibly low blood sugar.   Review of Systems  Constitutional:  Positive for malaise/fatigue.  Genitourinary:  Negative for dysuria and urgency.       Unable to empty bladder.  Musculoskeletal:  Positive for back pain, falls and myalgias. Negative for neck pain.       Near syncopal episode, where patient had to lower herself to the ground.  Neurological:  Positive for dizziness.      Objective:     BP 135/73 (BP Location: Right Arm, Patient Position: Sitting, Cuff Size: Large)   Pulse 88   Temp 99.7 F (37.6 C) (Oral)   Resp 18   Ht 5' 8 (1.727 m)   Wt 237 lb (107.5 kg)   SpO2 95%   BMI 36.04 kg/m  BP Readings from Last 3 Encounters:  04/05/24 135/73  03/01/24 121/73  01/27/24 (!) 140/44      Physical Exam Constitutional:      Appearance: Normal appearance. She is obese.  HENT:     Head: Normocephalic and atraumatic.  Cardiovascular:     Rate and Rhythm: Normal rate and regular rhythm.     Pulses: Normal pulses.     Heart sounds: Normal heart  sounds.  Pulmonary:     Effort: Pulmonary effort is normal.     Breath sounds: Normal breath sounds.  Abdominal:     General: Abdomen is flat. Bowel sounds are normal. There is no distension.     Palpations: Abdomen is soft.     Tenderness: There is no abdominal tenderness. There is no right CVA tenderness or left CVA tenderness.  Musculoskeletal:     Cervical back: Normal range of motion and neck supple.     Right lower leg: Edema present.     Left lower leg: Edema present.  Neurological:     Mental Status: She is alert.      Results for orders placed or performed in visit on 04/05/24  POCT URINALYSIS DIP (CLINITEK)  Result Value Ref Range   Color, UA yellow yellow   Clarity, UA clear clear   Glucose, UA negative negative mg/dL   Bilirubin, UA negative negative   Ketones, POC UA negative negative mg/dL   Spec Grav, UA 8.989 8.989 - 1.025   Blood, UA negative negative   pH, UA 7.5 5.0 - 8.0   POC PROTEIN,UA negative negative, trace   Urobilinogen, UA 0.2 0.2 or 1.0 E.U./dL   Nitrite, UA Negative Negative   Leukocytes, UA Trace (A) Negative        Assessment &  Plan:   Bilateral lower leg edema- new Urinary retention; Bloating ; Dysuria - adding on as needed furosemide  after a 3-4 day course - education on leg elevation and use of compression socks - Checking TSH, CMP, CBC - UA with trace leukocytes; sending for culture   Essential hypertension - 136/84 today  - continue medications - CMP   Hx of hypothyroidism  - checking TSH, iron panel, CMP, CBC  Abnormal weight gain Obesity Class I - Checking A1C, TSH, CMP,  - BMI 36.04 .SABRADiscussed low carb diet with 1500 calories and 80g of protein.  Exercising at least 150 minutes a week.  [employer] Fitness Pal could be a Chief Technology Officer.   Hx of high vitamin D  and vitamin Deficiency - rechecking Vit D levels    Return in about 4 weeks (around 05/03/2024) for lower leg edema swelling.    Ishi Danser, PA-C

## 2024-04-05 NOTE — Patient Instructions (Signed)
 Get labs today Elevate and compress legs Will culture urine Start lasix  for 3-4 days daily then as needed for lower leg swelling

## 2024-04-06 ENCOUNTER — Ambulatory Visit: Payer: Self-pay | Admitting: Physician Assistant

## 2024-04-06 ENCOUNTER — Encounter: Payer: Self-pay | Admitting: Physician Assistant

## 2024-04-06 LAB — CMP14+EGFR
ALT: 39 IU/L — ABNORMAL HIGH (ref 0–32)
AST: 31 IU/L (ref 0–40)
Albumin: 4.4 g/dL (ref 3.9–4.9)
Alkaline Phosphatase: 79 IU/L (ref 44–121)
BUN/Creatinine Ratio: 16 (ref 12–28)
BUN: 13 mg/dL (ref 8–27)
Bilirubin Total: 0.4 mg/dL (ref 0.0–1.2)
CO2: 22 mmol/L (ref 20–29)
Calcium: 9.2 mg/dL (ref 8.7–10.3)
Chloride: 105 mmol/L (ref 96–106)
Creatinine, Ser: 0.83 mg/dL (ref 0.57–1.00)
Globulin, Total: 2.3 g/dL (ref 1.5–4.5)
Glucose: 102 mg/dL — ABNORMAL HIGH (ref 70–99)
Potassium: 4.3 mmol/L (ref 3.5–5.2)
Sodium: 139 mmol/L (ref 134–144)
Total Protein: 6.7 g/dL (ref 6.0–8.5)
eGFR: 79 mL/min/1.73 (ref >59)

## 2024-04-06 LAB — CBC WITH DIFFERENTIAL/PLATELET
Basophils Absolute: 0.1 x10E3/uL (ref 0.0–0.2)
Basos: 0 %
EOS (ABSOLUTE): 0 x10E3/uL (ref 0.0–0.4)
Eos: 0 %
Hematocrit: 44.8 % (ref 34.0–46.6)
Hemoglobin: 14.8 g/dL (ref 11.1–15.9)
Immature Grans (Abs): 0.2 x10E3/uL — ABNORMAL HIGH (ref 0.0–0.1)
Immature Granulocytes: 1 %
Lymphocytes Absolute: 2.7 x10E3/uL (ref 0.7–3.1)
Lymphs: 18 %
MCH: 32.6 pg (ref 26.6–33.0)
MCHC: 33 g/dL (ref 31.5–35.7)
MCV: 99 fL — ABNORMAL HIGH (ref 79–97)
Monocytes Absolute: 0.9 x10E3/uL (ref 0.1–0.9)
Monocytes: 6 %
Neutrophils Absolute: 11.5 x10E3/uL — ABNORMAL HIGH (ref 1.4–7.0)
Neutrophils: 75 %
Platelets: 282 x10E3/uL (ref 150–450)
RBC: 4.54 x10E6/uL (ref 3.77–5.28)
RDW: 14 % (ref 11.7–15.4)
WBC: 15.4 x10E3/uL — ABNORMAL HIGH (ref 3.4–10.8)

## 2024-04-06 LAB — IRON,TIBC AND FERRITIN PANEL
Ferritin: 67 ng/mL (ref 15–150)
Iron Saturation: 32 % (ref 15–55)
Iron: 81 ug/dL (ref 27–139)
Total Iron Binding Capacity: 256 ug/dL (ref 250–450)
UIBC: 175 ug/dL (ref 118–369)

## 2024-04-06 LAB — HEMOGLOBIN A1C
Est. average glucose Bld gHb Est-mCnc: 103 mg/dL
Hgb A1c MFr Bld: 5.2 % (ref 4.8–5.6)

## 2024-04-06 LAB — VITAMIN D 25 HYDROXY (VIT D DEFICIENCY, FRACTURES): Vit D, 25-Hydroxy: 74.4 ng/mL (ref 30.0–100.0)

## 2024-04-06 LAB — TSH+FREE T4
Free T4: 0.95 ng/dL (ref 0.82–1.77)
TSH: 6.38 u[IU]/mL — ABNORMAL HIGH (ref 0.450–4.500)

## 2024-04-06 NOTE — Progress Notes (Signed)
 Aimee Little,   WBC likely elevated due to steroid injections.  A1c normal range.  Vitamin D  in normal range.  Iron studies look good.  TSH out of normal range suggesting a trend towards more hypothyroid. Are you ok with an increase in levothyroxine ? Do you still want to try BRAND only?

## 2024-04-07 LAB — URINE CULTURE: Organism ID, Bacteria: NO GROWTH

## 2024-04-07 MED ORDER — LEVOTHYROXINE SODIUM 300 MCG PO TABS: 300.0000 ug | ORAL_TABLET | Freq: Every day | ORAL | 0 refills | Status: DC

## 2024-04-07 NOTE — Progress Notes (Signed)
 Aimee Little,   Increase synthroid  to 300mcg. I sent new tablets or can take 2 of the 150 tablets until you pick up 300mcg.   No growth of bacteria  in urine culture.

## 2024-04-08 ENCOUNTER — Ambulatory Visit

## 2024-04-26 ENCOUNTER — Other Ambulatory Visit: Payer: Self-pay | Admitting: Physician Assistant

## 2024-04-26 DIAGNOSIS — E782 Mixed hyperlipidemia: Secondary | ICD-10-CM

## 2024-04-28 ENCOUNTER — Ambulatory Visit

## 2024-05-03 ENCOUNTER — Encounter (INDEPENDENT_AMBULATORY_CARE_PROVIDER_SITE_OTHER): Admitting: Physician Assistant

## 2024-05-03 DIAGNOSIS — E039 Hypothyroidism, unspecified: Secondary | ICD-10-CM

## 2024-05-03 DIAGNOSIS — Z0000 Encounter for general adult medical examination without abnormal findings: Secondary | ICD-10-CM

## 2024-05-03 NOTE — Progress Notes (Signed)
 This encounter was created in error - please disregard.

## 2024-05-03 NOTE — Progress Notes (Signed)
 TSH to be checked in next 2 weeks. No other appt needed.

## 2024-05-26 ENCOUNTER — Encounter: Admitting: Physician Assistant

## 2024-05-26 ENCOUNTER — Other Ambulatory Visit: Payer: Self-pay

## 2024-05-27 LAB — CMP14+EGFR
ALT: 31 IU/L (ref 0–32)
AST: 15 IU/L (ref 0–40)
Albumin: 4 g/dL (ref 3.9–4.9)
Alkaline Phosphatase: 78 IU/L (ref 44–121)
BUN/Creatinine Ratio: 34 — ABNORMAL HIGH (ref 12–28)
BUN: 25 mg/dL (ref 8–27)
Bilirubin Total: 0.4 mg/dL (ref 0.0–1.2)
CO2: 16 mmol/L — ABNORMAL LOW (ref 20–29)
Calcium: 9.5 mg/dL (ref 8.7–10.3)
Chloride: 109 mmol/L — ABNORMAL HIGH (ref 96–106)
Creatinine, Ser: 0.74 mg/dL (ref 0.57–1.00)
Globulin, Total: 2.3 g/dL (ref 1.5–4.5)
Glucose: 135 mg/dL — ABNORMAL HIGH (ref 70–99)
Potassium: 3.1 mmol/L — ABNORMAL LOW (ref 3.5–5.2)
Sodium: 144 mmol/L (ref 134–144)
Total Protein: 6.3 g/dL (ref 6.0–8.5)
eGFR: 90 mL/min/1.73 (ref >59)

## 2024-05-27 LAB — IRON,TIBC AND FERRITIN PANEL
Ferritin: 126 ng/mL (ref 15–150)
Iron Saturation: 62 % — ABNORMAL HIGH (ref 15–55)
Iron: 151 ug/dL — ABNORMAL HIGH (ref 27–139)
Total Iron Binding Capacity: 245 ug/dL — ABNORMAL LOW (ref 250–450)
UIBC: 94 ug/dL — ABNORMAL LOW (ref 118–369)

## 2024-05-27 LAB — TSH+FREE T4
Free T4: 2.54 ng/dL — ABNORMAL HIGH (ref 0.82–1.77)
TSH: 0.012 u[IU]/mL — ABNORMAL LOW (ref 0.450–4.500)

## 2024-05-27 LAB — TRANSFERRIN: Transferrin: 220 mg/dL (ref 192–364)

## 2024-06-01 ENCOUNTER — Ambulatory Visit: Payer: Self-pay | Admitting: Physician Assistant

## 2024-06-01 ENCOUNTER — Encounter: Payer: Self-pay | Admitting: Sports Medicine

## 2024-06-01 DIAGNOSIS — E039 Hypothyroidism, unspecified: Secondary | ICD-10-CM

## 2024-06-01 DIAGNOSIS — E876 Hypokalemia: Secondary | ICD-10-CM

## 2024-06-01 MED ORDER — POTASSIUM CHLORIDE CRYS ER 10 MEQ PO TBCR: 10.0000 meq | EXTENDED_RELEASE_TABLET | Freq: Every day | ORAL | 0 refills | Status: DC

## 2024-06-01 MED ORDER — LEVOTHYROXINE SODIUM 137 MCG PO TABS
ORAL_TABLET | ORAL | 1 refills | Status: DC
Start: 2024-06-01 — End: 2024-07-19

## 2024-06-01 NOTE — Telephone Encounter (Signed)
Patient viewed recommendations

## 2024-06-01 NOTE — Progress Notes (Signed)
 We need to cut back on synthroid . You have too much thyroid  medication detected. We can send 2 tablets of the 137mcg to take daily and recheck in 6 weeks.   Potassium is low. Start 10meq of potassium daily and recheck labs on Thursday or Friday.

## 2024-06-01 NOTE — Progress Notes (Signed)
 Iron increased too much. Take one-half of what you are currently taking of iron.

## 2024-06-01 NOTE — Telephone Encounter (Signed)
 Copied from CRM [id number]. Topic: Clinical - Lab/Test Results >> Jun 01, 2024 10:04 AM Mercer PEDLAR wrote: Reason for CRM: Patient would like a callback to discuss lab results.

## 2024-06-02 ENCOUNTER — Ambulatory Visit (INDEPENDENT_AMBULATORY_CARE_PROVIDER_SITE_OTHER): Admitting: Physician Assistant

## 2024-06-02 ENCOUNTER — Encounter: Payer: Self-pay | Admitting: Physician Assistant

## 2024-06-02 VITALS — BP 126/79 | HR 117 | Ht 68.0 in

## 2024-06-02 DIAGNOSIS — Z79899 Other long term (current) drug therapy: Secondary | ICD-10-CM

## 2024-06-02 DIAGNOSIS — E039 Hypothyroidism, unspecified: Secondary | ICD-10-CM

## 2024-06-02 DIAGNOSIS — E0590 Thyrotoxicosis, unspecified without thyrotoxic crisis or storm: Secondary | ICD-10-CM | POA: Diagnosis not present

## 2024-06-02 DIAGNOSIS — E876 Hypokalemia: Secondary | ICD-10-CM

## 2024-06-02 DIAGNOSIS — F419 Anxiety disorder, unspecified: Secondary | ICD-10-CM

## 2024-06-02 DIAGNOSIS — R790 Abnormal level of blood mineral: Secondary | ICD-10-CM | POA: Insufficient documentation

## 2024-06-02 DIAGNOSIS — R000 Tachycardia, unspecified: Secondary | ICD-10-CM

## 2024-06-02 NOTE — Progress Notes (Unsigned)
   Established Patient Office Visit  Subjective   Patient ID: Aimee Little, female    DOB: 04/28/1961  Age: 63 y.o. MRN: [id number]  Chief Complaint  Patient presents with   Medical Management of Chronic Issues    HPI Pt is a 63 yo female who presents to the clinic to discuss labs and her feeling like heart beating out of chest, hot flashes, anxiousness, and short of breath.   She is concerned about labs. Her TSH is now low and her serum iron doubled. She is not taking any iron. Her potassium was low. She started taking potassium tablets today.   Pt has hx of anxiety and depression but this anxiety feels different. It has worsened over the last month when her thyroid  medication was increased.   She is worried about hemochromatosis. Her sister has this.    ROS See HPI.    Objective:     BP 126/79   Pulse (!) 117   Ht 5' 8 (1.727 m)   SpO2 98%   BMI 36.04 kg/m  BP Readings from Last 3 Encounters:  06/02/24 126/79  04/05/24 135/73  03/01/24 121/73   Wt Readings from Last 3 Encounters:  04/05/24 237 lb (107.5 kg)  07/30/23 216 lb (98 kg)  07/10/23 223 lb (101.2 kg)      Physical Exam Constitutional:      Appearance: She is obese.  HENT:     Head: Normocephalic.  Cardiovascular:     Rate and Rhythm: Tachycardia present.  Pulmonary:     Effort: Pulmonary effort is normal.  Neurological:     Mental Status: She is alert.  Psychiatric:     Comments: anxious     Last CBC Lab Results  Component Value Date   WBC 15.4 (H) 04/05/2024   HGB 14.8 04/05/2024   HCT 44.8 04/05/2024   MCV 99 (H) 04/05/2024   MCH 32.6 04/05/2024   RDW 14.0 04/05/2024   PLT 282 04/05/2024   Last thyroid  functions Lab Results  Component Value Date   TSH 0.012 (L) 05/26/2024   T3TOTAL 77.81 01/22/2021   T4TOTAL 8.7 10/31/2021        Assessment & Plan:  .Aimee Little was seen today for medical management of chronic issues.  Diagnoses and all orders for this  visit:  Hyperthyroidism  Hypokalemia -     Potassium  Medication management -     TSH + free T4  Hypothyroidism, unspecified type -     TSH + free T4  Serum iron raised -     Fe+TIBC+Fer  Anxiety  Tachycardia   Patient is having hyperthyroid symptoms that I think is making her feel so bad.  Tachycardic today BP looks great Hold thyroid  medication for today Start back at 137mcg daily until feeling less anxious and heart racing Then increase to 2 tablets 137mcg daily to equal 274mcg From chart review at patient was hypothyroid and patient is hyperthyroid New dose of 274mcg This could be the reason for elevated serum iron as well Recheck in 4 weeks both thyroid  and iron panel Discussed hemochromatosis and liver enzymes being fine and elevation being so sudden Can consider work up if TSH labs return to normal and iron panel still off Recheck potassium on Monday   Aimee Murphey, PA-C

## 2024-06-02 NOTE — Patient Instructions (Signed)
 Potassium checked Monday 4 weeks thyroid  and iron again

## 2024-06-04 ENCOUNTER — Encounter: Payer: Self-pay | Admitting: Physician Assistant

## 2024-06-08 ENCOUNTER — Ambulatory Visit: Payer: Self-pay | Admitting: Physician Assistant

## 2024-06-08 LAB — POTASSIUM: Potassium: 4.1 mmol/L (ref 3.5–5.2)

## 2024-06-08 NOTE — Progress Notes (Signed)
Normal. Great news!

## 2024-06-14 ENCOUNTER — Other Ambulatory Visit: Payer: Self-pay | Admitting: Physician Assistant

## 2024-06-18 NOTE — Telephone Encounter (Signed)
 ALPRAZOLAM  Last OV: 06/29/24 Next OV: no appointment Last RF: 01/29/24

## 2024-06-21 ENCOUNTER — Telehealth: Payer: Self-pay

## 2024-06-21 NOTE — Telephone Encounter (Signed)
 Copied from CRM [id number]. Topic: Appointments - Scheduling Inquiry for Clinic >> Jun 21, 2024  1:20 PM Corin V wrote: Reason for CRM: Patient is requesting a TB test. She is coming in for a flu shot tomorrow and would it during that visit if possible. Please call back [phone]

## 2024-06-21 NOTE — Telephone Encounter (Signed)
 Patient advised she can get both.

## 2024-06-21 NOTE — Telephone Encounter (Signed)
..  PDMP reviewed during this encounter. No concerns.

## 2024-06-22 ENCOUNTER — Ambulatory Visit (INDEPENDENT_AMBULATORY_CARE_PROVIDER_SITE_OTHER)

## 2024-06-22 VITALS — Temp 97.6°F

## 2024-06-22 DIAGNOSIS — Z23 Encounter for immunization: Secondary | ICD-10-CM

## 2024-06-22 DIAGNOSIS — Z111 Encounter for screening for respiratory tuberculosis: Secondary | ICD-10-CM

## 2024-06-22 NOTE — Patient Instructions (Signed)
Return in 48 to 72 hours for PPD read

## 2024-06-22 NOTE — Progress Notes (Signed)
   Established Patient Office Visit  Subjective   Patient ID: Aimee Little, female    DOB: Apr 14, 1961  Age: 63 y.o. MRN: [id number]  Chief Complaint  Patient presents with   PPD Placement    PPD placement nurse visit    Immunizations    Flu vaccine - nurse visit.     HPI  Screening for TB - PPD placement nurse visit. Pateint feeling well today denies fever - needing PPD testing for employer.   Need for flu vaccine - nurse visit- no previous problems with any vaccines given in past - no allergy to eggs or latex.   ROS    Objective:     Temp 97.6 F (36.4 C)    Physical Exam   No results found for any visits on 06/22/24.    The ASCVD Risk score (Arnett DK, et al., 2019) failed to calculate for the following reasons:   The valid total cholesterol range is 130 to 320 mg/dL    Assessment & Plan:  PPD Placement - placed right forearm  without complications  Fluzone administered Left deltoid IM without complications.  Problem List Items Addressed This Visit   None   No follow-ups on file.    Suzen SHAUNNA Plenty, LPN

## 2024-06-23 ENCOUNTER — Telehealth: Payer: Self-pay

## 2024-06-23 NOTE — Telephone Encounter (Signed)
 LM for pt that she needs to come in after 330 or around 330 for her PPD reading.

## 2024-06-23 NOTE — Telephone Encounter (Signed)
 Patient called back in and was rescheduled for 4pm

## 2024-06-24 ENCOUNTER — Ambulatory Visit

## 2024-06-24 ENCOUNTER — Ambulatory Visit (INDEPENDENT_AMBULATORY_CARE_PROVIDER_SITE_OTHER)

## 2024-06-24 VITALS — BP 128/64 | HR 89 | Ht 68.0 in

## 2024-06-24 DIAGNOSIS — Z111 Encounter for screening for respiratory tuberculosis: Secondary | ICD-10-CM | POA: Insufficient documentation

## 2024-06-24 LAB — TB SKIN TEST
Induration: 0 mm
TB Skin Test: NEGATIVE

## 2024-06-24 NOTE — Progress Notes (Signed)
   Established Patient Office Visit  Subjective   Patient ID: Aimee Little, female    DOB: 02/01/1961  Age: 63 y.o. MRN: [id number]  Chief Complaint  Patient presents with   PPD Reading    PPD read - nurse visit    HPI  PPD reading - nurse visit. PPD   was placed on 06/22/2024.  ROS    Objective:     BP 128/64   Pulse 89   Ht 5' 8 (1.727 m)   SpO2 97%   BMI 36.04 kg/m    Physical Exam   No results found for any visits on 06/24/24.    The ASCVD Risk score (Arnett DK, et al., 2019) failed to calculate for the following reasons:   The valid total cholesterol range is 130 to 320 mg/dL    Assessment & Plan:  PPD read- negative - 0mm induration - some slight bruising at injection site- no redness around site Problem List Items Addressed This Visit       Other   Screening examination for pulmonary tuberculosis - Primary    Return for return as needed .    Suzen SHAUNNA Plenty, LPN

## 2024-06-24 NOTE — Patient Instructions (Signed)
 Return as needed

## 2024-07-07 ENCOUNTER — Ambulatory Visit: Payer: Self-pay

## 2024-07-07 NOTE — Telephone Encounter (Signed)
 Spoke with patient - declines the hydroxyzine  - still requesting the xanax  be refilled  She states that she is only taking one hydrocodone  per day early in the morning.  I did inform her that she could not take the benzodiazepine because she is taking the norco but asked that I still request the xanax  refill from [place], [place]

## 2024-07-07 NOTE — Telephone Encounter (Signed)
 Please advise, thanks.

## 2024-07-07 NOTE — Telephone Encounter (Signed)
 Pt is taking norco regularly and not supposed to take benzodiazpines with it. She can take hydroxyzine . Does she want to try that?

## 2024-07-07 NOTE — Telephone Encounter (Signed)
 Patient would like message sent to PCP to request medication, states she has seen her recently. Advised that she will be contacted if she requires an appt. Patient was provided with the 24 hr [facility] hotline.   FYI Only or Action Required?: Action required by provider: clinical question for provider.  Patient was last seen in primary care on 06/02/2024 by Antoniette Vermell CROME, PA-C.  Called Nurse Triage reporting Anxiety.  Symptoms began about a month ago.  Interventions attempted: Other: she has tried to make herself go out but it is difficult.  Symptoms are: gradually worsening.  Triage Disposition: See Physician Within 24 Hours  Patient/caregiver understands and will follow disposition?: No, refuses disposition Reason for Disposition  Patient sounds very upset or troubled to the triager  Answer Assessment - Initial Assessment Questions Patient requesting to re-start antianxiety medication d/t panic attacks. States xanax  helps, but is causing weight gain and she cannot take with her pain medication. States that her son stopped talking to her and that has caused an increase in her anxiety level.  1. CONCERN: Did anything happen that prompted you to call today?      Panic attacks  2. ANXIETY SYMPTOMS: Can you describe how you (your loved one; patient) have been feeling? (e.g., tense, restless, panicky, anxious, keyed up, overwhelmed, sense of impending doom).      Panic attacks limiting ability to work  3. ONSET: How long have you been feeling this way? (e.g., hours, days, weeks)     One month  4. SEVERITY: How would you rate the level of anxiety? (e.g., 0 - 10; or mild, moderate, severe).     Severe  5. FUNCTIONAL IMPAIRMENT: How have these feelings affected your ability to do daily activities? Have you had more difficulty than usual doing your normal daily activities? (e.g., getting better, same, worse; self-care, school, work, interactions)     Limiting her ability to go  out  6. HISTORY: Have you felt this way before? Have you ever been diagnosed with an anxiety problem in the past? (e.g., generalized anxiety disorder, panic attacks, PTSD). If Yes, ask: How was this problem treated? (e.g., medicines, counseling, etc.)     Yes, hx of same  7. RISK OF HARM - SUICIDAL IDEATION: Do you ever have thoughts of hurting or killing yourself? If Yes, ask:  Do you have these feelings now? Do you have a plan on how you would do this?     Denies  8. TREATMENT:  What has been done so far to treat this anxiety? (e.g., medicines, relaxation strategies). What has helped?     Xanax   9. THERAPIST: Do you have a counselor or therapist? If Yes, ask: What is their name?     Had one in the past  10. POTENTIAL TRIGGERS: Do you drink caffeinated beverages (e.g., coffee, colas, teas), and how much daily? Do you drink alcohol or use any drugs? Have you started any new medicines recently?       Son stopped contact with her  58. PATIENT SUPPORT: Who is with you now? Who do you live with? Do you have family or friends who you can talk to?        Sister and friend  Protocols used: Anxiety and Panic Attack-A-AH Copied from CRM #[id number]. Topic: Clinical - Medication Question >> Jul 07, 2024  9:52 AM Tonda B wrote: Reason for CRM: patient is calling in having panic attacks and wants to know if she can get an rx for  it pt says she was on an rx in the past for it it has been a long time and she don not want to schedule an appt please call pt back [phone] (M)

## 2024-07-09 ENCOUNTER — Other Ambulatory Visit: Payer: Self-pay | Admitting: Physician Assistant

## 2024-07-09 ENCOUNTER — Encounter: Payer: Self-pay | Admitting: Physician Assistant

## 2024-07-09 DIAGNOSIS — F410 Panic disorder [episodic paroxysmal anxiety] without agoraphobia: Secondary | ICD-10-CM

## 2024-07-09 MED ORDER — ALPRAZOLAM 0.5 MG PO TABS: ORAL_TABLET | ORAL | 0 refills | Status: AC

## 2024-07-09 MED ORDER — HYDROXYZINE PAMOATE 25 MG PO CAPS
25.0000 mg | ORAL_CAPSULE | Freq: Three times a day (TID) | ORAL | 0 refills | Status: AC | PRN
Start: 2024-07-09 — End: ?

## 2024-07-09 MED ORDER — ALPRAZOLAM 0.5 MG PO TABS: ORAL_TABLET | ORAL | 0 refills | Status: DC

## 2024-07-09 NOTE — Telephone Encounter (Signed)
 Do not take with norco. Sent 30 for no more than once a day. Can use with hydroxyzine  that was sent earlier.

## 2024-07-09 NOTE — Addendum Note (Signed)
 Addended by: ANTONIETTE RASHER L on: 07/09/2024 04:00 PM   Modules accepted: Orders

## 2024-07-12 NOTE — Telephone Encounter (Signed)
 Patient informed. She did go ahead and scheduled for 10/17/225 with Vermell Bologna, PA

## 2024-07-16 ENCOUNTER — Other Ambulatory Visit: Payer: Self-pay | Admitting: Physician Assistant

## 2024-07-16 ENCOUNTER — Ambulatory Visit (INDEPENDENT_AMBULATORY_CARE_PROVIDER_SITE_OTHER): Admitting: Physician Assistant

## 2024-07-16 VITALS — BP 160/80 | HR 71

## 2024-07-16 DIAGNOSIS — Z79899 Other long term (current) drug therapy: Secondary | ICD-10-CM | POA: Diagnosis not present

## 2024-07-16 DIAGNOSIS — F410 Panic disorder [episodic paroxysmal anxiety] without agoraphobia: Secondary | ICD-10-CM | POA: Diagnosis not present

## 2024-07-16 DIAGNOSIS — F332 Major depressive disorder, recurrent severe without psychotic features: Secondary | ICD-10-CM

## 2024-07-16 DIAGNOSIS — I10 Essential (primary) hypertension: Secondary | ICD-10-CM

## 2024-07-16 DIAGNOSIS — E876 Hypokalemia: Secondary | ICD-10-CM

## 2024-07-16 DIAGNOSIS — Z6841 Body Mass Index (BMI) 40.0 and over, adult: Secondary | ICD-10-CM

## 2024-07-16 DIAGNOSIS — E66813 Obesity, class 3: Secondary | ICD-10-CM

## 2024-07-16 DIAGNOSIS — R790 Abnormal level of blood mineral: Secondary | ICD-10-CM

## 2024-07-16 DIAGNOSIS — E039 Hypothyroidism, unspecified: Secondary | ICD-10-CM

## 2024-07-16 MED ORDER — TIRZEPATIDE 10 MG/0.5ML ~~LOC~~ SOAJ: SUBCUTANEOUS | 4 refills | Status: AC

## 2024-07-16 MED ORDER — REXULTI 0.5 MG PO TABS: ORAL_TABLET | ORAL | 0 refills | Status: DC

## 2024-07-16 NOTE — Patient Instructions (Addendum)
 Start rexulti daily.  Get labs today.  Start 25units then 50 units for 4 week etc

## 2024-07-17 ENCOUNTER — Encounter: Payer: Self-pay | Admitting: Physician Assistant

## 2024-07-17 DIAGNOSIS — F5101 Primary insomnia: Secondary | ICD-10-CM

## 2024-07-17 LAB — CMP14+EGFR
ALT: 24 IU/L (ref 0–32)
AST: 19 IU/L (ref 0–40)
Albumin: 4.2 g/dL (ref 3.9–4.9)
Alkaline Phosphatase: 87 IU/L (ref 49–135)
BUN/Creatinine Ratio: 28 (ref 12–28)
BUN: 18 mg/dL (ref 8–27)
Bilirubin Total: 0.4 mg/dL (ref 0.0–1.2)
CO2: 20 mmol/L (ref 20–29)
Calcium: 9.2 mg/dL (ref 8.7–10.3)
Chloride: 105 mmol/L (ref 96–106)
Creatinine, Ser: 0.64 mg/dL (ref 0.57–1.00)
Globulin, Total: 2 g/dL (ref 1.5–4.5)
Glucose: 95 mg/dL (ref 70–99)
Potassium: 3.6 mmol/L (ref 3.5–5.2)
Sodium: 141 mmol/L (ref 134–144)
Total Protein: 6.2 g/dL (ref 6.0–8.5)
eGFR: 99 mL/min/1.73 (ref >59)

## 2024-07-17 LAB — IRON,TIBC AND FERRITIN PANEL
Ferritin: 121 ng/mL (ref 15–150)
Iron Saturation: 63 % — ABNORMAL HIGH (ref 15–55)
Iron: 147 ug/dL — ABNORMAL HIGH (ref 27–139)
Total Iron Binding Capacity: 234 ug/dL — ABNORMAL LOW (ref 250–450)
UIBC: 87 ug/dL — ABNORMAL LOW (ref 118–369)

## 2024-07-17 LAB — TSH+FREE T4
Free T4: 2.23 ng/dL — ABNORMAL HIGH (ref 0.82–1.77)
TSH: 0.009 u[IU]/mL — ABNORMAL LOW (ref 0.450–4.500)

## 2024-07-19 ENCOUNTER — Encounter: Payer: Self-pay | Admitting: Physician Assistant

## 2024-07-19 ENCOUNTER — Ambulatory Visit: Payer: Self-pay | Admitting: Physician Assistant

## 2024-07-19 MED ORDER — POTASSIUM CHLORIDE CRYS ER 10 MEQ PO TBCR: 10.0000 meq | EXTENDED_RELEASE_TABLET | Freq: Every day | ORAL | 2 refills | Status: DC

## 2024-07-19 MED ORDER — TRAZODONE HCL 100 MG PO TABS
200.0000 mg | ORAL_TABLET | Freq: Every day | ORAL | 1 refills | Status: AC
Start: 2024-07-19 — End: ?

## 2024-07-19 MED ORDER — LEVOTHYROXINE SODIUM 125 MCG PO TABS: ORAL_TABLET | ORAL | 1 refills | Status: DC

## 2024-07-19 NOTE — Progress Notes (Signed)
 Established Patient Office Visit  Subjective   Patient ID: Aimee Little, female    DOB: [birth year]  Age: 63 y.o. MRN: [id number]  Chief Complaint  Patient presents with   Medical Management of Chronic Issues     anxiety - and weight concerns    HPI Discussed the use of AI scribe software for clinical note transcription with the patient, who gave verbal consent to proceed.  History of Present Illness Aimee Little is a 63 year old female who presents with worsening depression and weight management concerns.  Depressive symptoms - Worsening depression with significant impact on daily functioning - Feels unable to accomplish tasks - Previous use of Vraylar  for depression resulted in weight gain and exacerbation of panic attacks - Interested in trial of Rexulti as an alternative medication  Weight management and appetite dysregulation - Concerned about weight and experiences depressive symptoms related to weight - Constant hunger with stomach growling - Previous trial of semaglutide  for four months without weight loss or appetite suppression - Considering tirzepatide and discussing dosing options  Respiratory symptoms - Shortness of breath attributed to weight - No trouble breathing or wheezing - Continues to smoke  Thyroid  hormone therapy - Currently taking levothyroxine  137 mcg twice daily - Thyroid  levels have not been checked recently but plans to do so during this visit    ROS See HPI.    Objective:     BP (!) 160/80   Pulse 71   SpO2 99%  BP Readings from Last 3 Encounters:  07/16/24 (!) 160/80  06/24/24 128/64  06/02/24 126/79   Wt Readings from Last 3 Encounters:  04/05/24 237 lb (107.5 kg)  07/30/23 216 lb (98 kg)  07/10/23 223 lb (101.2 kg)      Physical Exam Constitutional:      Appearance: Normal appearance. She is obese.  HENT:     Head: Normocephalic.  Cardiovascular:     Rate and Rhythm: Normal rate and regular rhythm.  Pulmonary:      Effort: Pulmonary effort is normal.     Breath sounds: Normal breath sounds.  Musculoskeletal:     Right lower leg: No edema.     Left lower leg: No edema.  Neurological:     General: No focal deficit present.     Mental Status: She is alert and oriented to person, place, and time.  Psychiatric:     Comments: anxious        Assessment & Plan:  SABRASABRAMelissa was seen today for medical management of chronic issues.  Diagnoses and all orders for this visit:  Severe episode of recurrent major depressive disorder, without psychotic features (HCC) -     Brexpiprazole (REXULTI) 0.5 MG TABS; Take one tablet daily.  Panic attacks  Medication management -     CMP14+EGFR -     TSH + free T4 -     Fe+TIBC+Fer  Hypokalemia -     CMP14+EGFR  Hypothyroidism, unspecified type -     CMP14+EGFR -     TSH + free T4 -     Fe+TIBC+Fer  Serum iron raised -     CMP14+EGFR -     TSH + free T4 -     Fe+TIBC+Fer  Class 3 severe obesity due to excess calories with serious comorbidity and body mass index (BMI) of 40.0 to 44.9 in adult (HCC) -     tirzepatide (MOUNJARO) 10 MG/0.5ML Pen; Liposlim.  Tirzepatide/Pyridoxine/Thiamine/L-Carnitine 10mg /mL.  Inject 5 mg/50 units  subcu weekly for 4 weeks then 7.5 mg/75 units subcu weekly for 4 weeks then 10 mg/100 units subcu weekly for 4 weeks then 15 mg/150 units subcu weekly   Assessment & Plan Depression and Panic Disorder Persistent depression and panic attacks. Vraylar  alleviated some symptoms but caused weight gain and worsened panic attacks. Rexulti, with a weight-neutral profile, may better address anxiety and irritability without exacerbating panic attacks. - Prescribed Rexulti as an alternative to Vraylar  to manage depression and panic disorder.  Obesity Significant distress due to obesity, with constant hunger and lack of energy. Previous semaglutide  treatment was ineffective. Discussed starting tirzepatide, emphasizing starting at a low  dose due to potential severe side effects. - Prescribed tirzepatide, starting at 25 mg for the first week, with the option to increase to 50 mg if tolerated. - [facility]  Hypothyroidism Currently taking 137 mcg of thyroid  medication twice daily. Plans to check thyroid  levels to determine if adjustments are needed. No recent thyroid  function tests due to inability to visit the clinic. - Ordered thyroid  function tests to assess current thyroid  hormone levels.    Return in about 6 weeks (around 08/27/2024).    [name], PA-C

## 2024-07-19 NOTE — Progress Notes (Signed)
 [place],   TSH still too depressed and free T4 too high. We need to decrease dose again to 125mcg 2 tablets daily. Recheck in 4-6 weeks.   Kidney, liver, potassium all look much better.   Your ferritin is normal.  Yor serum iron is still just a little high.  Will recheck in 4 weeks with thyroid .

## 2024-07-20 ENCOUNTER — Other Ambulatory Visit: Payer: Self-pay | Admitting: Physician Assistant

## 2024-07-21 ENCOUNTER — Other Ambulatory Visit: Payer: Self-pay | Admitting: Physician Assistant

## 2024-07-22 ENCOUNTER — Other Ambulatory Visit: Payer: Self-pay | Admitting: Physician Assistant

## 2024-07-22 DIAGNOSIS — I10 Essential (primary) hypertension: Secondary | ICD-10-CM

## 2024-07-22 DIAGNOSIS — F5101 Primary insomnia: Secondary | ICD-10-CM

## 2024-07-26 ENCOUNTER — Ambulatory Visit: Payer: Self-pay

## 2024-07-26 NOTE — Telephone Encounter (Signed)
 FYI Only or Action Required?: Action required by provider: request for appointment.  Patient was last seen in primary care on 07/16/2024 by Antoniette Vermell CROME, PA-C.  Called Nurse Triage reporting No chief complaint on file..  Symptoms began about a month ago.  Interventions attempted: Prescription medications: Prilosec and Rest, hydration, or home remedies.  Symptoms are: gradually worsening.  Triage Disposition: No disposition on file.  Patient/caregiver understands and will follow disposition?:   Copied from CRM [id number]. Topic: Clinical - Red Word Triage >> Jul 26, 2024  9:41 AM Delon HERO wrote: Red Word that prompted transfer to Nurse Triage: Patient is calling to report occasional stomach pain for about 1.5 month with loose stools.  And rash underneath breast using desatin and gets better and gets worse. Right now rash is bad. With burning and rawness. Reason for Disposition  [1] MODERATE pain (e.g., interferes with normal activities) AND [2] pain comes and goes (cramps) AND [3] present > 24 hours  (Exception: Pain with Vomiting or Diarrhea - see that Guideline.)  Answer Assessment - Initial Assessment Questions 1. LOCATION: Where does it hurt?      Lower Abdominal Pain, Central Abdominal Pain  2. RADIATION: Does the pain shoot anywhere else? (e.g., chest, back)     No  3. ONSET: When did the pain begin? (e.g., minutes, hours or days ago)      One month and a half  4. SUDDEN: Gradual or sudden onset?     Gradual  5. PATTERN Does the pain come and go, or is it constant?     Worse at night, Constant  6. SEVERITY: How bad is the pain?  (e.g., Scale 1-10; mild, moderate, or severe)     6  7. RECURRENT SYMPTOM: Have you ever had this type of stomach pain before? If Yes, ask: When was the last time? and What happened that time?      No  8. CAUSE: What do you think is causing the stomach pain? (e.g., gallstones, recent abdominal surgery)      Unsure  9. RELIEVING/AGGRAVATING FACTORS: What makes it better or worse? (e.g., antacids, bending or twisting motion, bowel movement)     None  10. OTHER SYMPTOMS: Do you have any other symptoms? (e.g., back pain, diarrhea, fever, urination pain, vomiting)       Loose Stools, Rash Under the Breast  11. PREGNANCY: Is there any chance you are pregnant? When was your last menstrual period?       No and No  Protocols used: Abdominal Pain - Female-A-AH

## 2024-07-26 NOTE — Telephone Encounter (Signed)
 Patient scheduled 07/27/2024 for abdominal pain

## 2024-07-27 ENCOUNTER — Encounter: Payer: Self-pay | Admitting: Physician Assistant

## 2024-07-27 ENCOUNTER — Ambulatory Visit: Admitting: Physician Assistant

## 2024-07-27 VITALS — BP 130/80 | HR 80

## 2024-07-27 DIAGNOSIS — L304 Erythema intertrigo: Secondary | ICD-10-CM

## 2024-07-27 DIAGNOSIS — K5909 Other constipation: Secondary | ICD-10-CM | POA: Diagnosis not present

## 2024-07-27 DIAGNOSIS — F410 Panic disorder [episodic paroxysmal anxiety] without agoraphobia: Secondary | ICD-10-CM

## 2024-07-27 DIAGNOSIS — R1011 Right upper quadrant pain: Secondary | ICD-10-CM

## 2024-07-27 DIAGNOSIS — F332 Major depressive disorder, recurrent severe without psychotic features: Secondary | ICD-10-CM

## 2024-07-27 MED ORDER — NYSTATIN-TRIAMCINOLONE 100000-0.1 UNIT/GM-% EX OINT: 1.0000 | TOPICAL_OINTMENT | Freq: Two times a day (BID) | CUTANEOUS | 1 refills | Status: AC

## 2024-07-27 NOTE — Patient Instructions (Signed)
 Miralax 1 capful as needed daily for constipation Intertrigo Intertrigo is skin irritation (inflammation) that happens in warm, moist areas of the body. The irritation can cause a rash and make skin raw and itchy. The rash is usually pink or red. It happens mostly between folds of skin or where skin rubs together, such as: In the armpits. Under the breasts. Under the belly. In the groin area. Around the butt area. Between the toes. This condition is not passed from person to person. What are the causes? Heat, moisture, rubbing, and not enough air movement. The condition can be made worse by: Sweat. Bacteria. A fungus, such as yeast. What increases the risk? Moisture in your skin folds. You are more likely to develop this condition if you: Are not able to move around. Live in a warm and moist climate. Are not able to control your pee (urine) or poop (stool). Wear splints, braces, or other medical devices. Are overweight. Have diabetes. What are the signs or symptoms? A pink or red skin rash in a skin fold or near a skin fold. Raw or scaly skin. Itching. A burning feeling. Bleeding. Leaking fluid. A bad smell. How is this treated? Cleaning and drying your skin. Taking an antibiotic medicine or using an antibiotic skin cream for a bacterial infection. Using an antifungal cream on your skin or taking pills for an infection that was caused by a fungus, such as yeast. Using a steroid ointment to stop the itching and irritation. Separating the skin fold with a clean cotton cloth to absorb moisture and allow air to flow into the area. Follow these instructions at home: Keep the affected area clean and dry. Do not scratch your skin. Stay cool as much as you can. Use an air conditioner or a fan, if you have one. Apply over-the-counter and prescription medicines only as told by your doctor. If you were prescribed antibiotics, use them as told by your doctor. Do not stop using the  antibiotic even if you start to feel better. Keep all follow-up visits. Your doctor may need to check your skin to make sure that the treatment is working. How is this prevented? Shower and dry yourself well after being active. Use a hair dryer on a cool setting to dry between skin folds. Do not wear tight clothes. Wear clothes that: Are loose. Take moisture away from your body. Are made of cotton. Wear a bra that gives good support, if needed. Protect the skin in your groin and butt area as told by your doctor. To do this: Follow a regular cleaning routine. Use creams, powders, or ointments that protect your skin. Change protection pads often. Stay at a healthy weight. Take care of your feet. This is very important if you have diabetes. You should: Wear shoes that fit well. Keep your feet dry. Wear clean cotton or wool socks. Keep your blood sugar under control if you have diabetes. Contact a doctor if: Your symptoms do not get better with treatment. Your symptoms get worse or they spread. You notice more redness and warmth. You have a fever. This information is not intended to replace advice given to you by your health care provider. Make sure you discuss any questions you have with your health care provider. Document Revised: 02/07/2022 Document Reviewed: 02/07/2022 Elsevier Patient Education  2024 [employer].

## 2024-07-27 NOTE — Progress Notes (Signed)
 Established Patient Office Visit  Subjective   Patient ID: EVERLI ROTHER, female    DOB: Oct 29, 1960  Age: 63 y.o. MRN: [id number]  HPI .SABRADiscussed the use of AI scribe software for clinical note transcription with the patient, who gave verbal consent to proceed.  History of Present Illness Teila B Collazos is a 63 year old female who presents with gastrointestinal symptoms and a persistent rash.  Gastrointestinal symptoms - Two months of alternating constipation and loose stools, particularly after greasy or spicy foods - Significant abdominal pain, localized and tender to touch, severe enough to cause nocturnal awakening - No relief with Prilosec or Pepcid  - Discontinued hydrocodone  due to concern for constipation - No vomiting, hematochezia, or reflux  Intertriginous rash - Persistent rash under breasts for 1.5 to 2 months - Burning, raw sensation with some pruritus - Attributed to moisture trapped by bra - No improvement with cornstarch or Desitin despite keeping area clean and dry  Panic attacks - Currently taking Vraylar  with improvement in symptoms - Rexulti was intended to be started but not received  Hypertension - Currently taking hydrochlorothiazide , recently refilled - Blood pressure has been slightly elevated  Hyperlipidemia - Currently taking atorvastatin , needs refill  Chronic pain - Recently stopped hydrocodone  because she has needed to take benzodiapines.   Depression/anxiety - Currently taking Viibryd , recently refilled but will need another refill soon    ROS See HPi.    Objective:     BP 130/80   Pulse 80   SpO2 99%  BP Readings from Last 3 Encounters:  07/27/24 130/80  07/16/24 (!) 160/80  06/24/24 128/64   Wt Readings from Last 3 Encounters:  04/05/24 237 lb (107.5 kg)  07/30/23 216 lb (98 kg)  07/10/23 223 lb (101.2 kg)      Physical Exam Constitutional:      Appearance: She is well-developed. She is obese.  HENT:     Head:  Normocephalic.  Cardiovascular:     Rate and Rhythm: Normal rate and regular rhythm.  Pulmonary:     Effort: Pulmonary effort is normal.  Abdominal:     General: Bowel sounds are normal. There is no distension.     Palpations: Abdomen is soft.     Tenderness: There is abdominal tenderness in the right upper quadrant and epigastric area. There is no right CVA tenderness or left CVA tenderness.  Neurological:     General: No focal deficit present.     Mental Status: She is alert.  Psychiatric:        Mood and Affect: Mood is anxious.        Assessment & Plan:  SABRASABRAMelissa was seen today for abdominal pain.  Diagnoses and all orders for this visit:  Right upper quadrant pain -     US  Abdomen Complete; Future  Intertrigo -     nystatin-triamcinolone  ointment (MYCOLOG); Apply 1 Application topically 2 (two) times daily.  Chronic constipation  Morbid obesity (HCC)  Panic attacks  Severe episode of recurrent major depressive disorder, without psychotic features (HCC)   Assessment & Plan Morbid Obesity Started new medication regimen(liposlim) with reduced hunger and no side effects. Anticipated weight loss to improve blood pressure. - Increase dosage from 25 units as planned.  Intertrigo under breasts Persistent rash likely due to moisture trapping. Ineffective treatment with cornstarch and Desitin. Suspected yeast infection. - Prescribe nystatin cream. - Advise using a cotton sports bra to absorb moisture. - Recommend allowing the area to dry completely after  showering.  Suspected gallbladder dysfunction Abdominal pain and loose stools after greasy or spicy foods. Suspected gallbladder dysfunction due to stones or inflammation. - Order abdominal ultrasound to evaluate gallbladder function. - Increase omeprazole  to twice daily.  Constipation and loose stools Alternating constipation and loose stools. Stopped hydrocodone  may have contributed to constipation. - Recommend  daily Miralax, even if not a full dose, to manage constipation.  Hypertension Slightly elevated blood pressure, possibly due to anxiety. Weight loss expected to help reduce blood pressure. - Advise home blood pressure monitoring. - improved on 2nd recheck  Hyperlipidemia Requires refill of atorvastatin  prescription. - Refill atorvastatin  prescription.  Depression and panic disorder Experiencing panic attacks, improved with Vraylar . Unaware of Rexulti prescription, plans to switch after finishing Vraylar . - Document current use of Vraylar . - Consider switching to Rexulti after Vraylar  supply is finished.      Isiaah Cuervo, PA-C

## 2024-07-29 ENCOUNTER — Other Ambulatory Visit

## 2024-08-02 ENCOUNTER — Encounter: Payer: Self-pay | Admitting: Physician Assistant

## 2024-08-05 ENCOUNTER — Other Ambulatory Visit

## 2024-08-05 DIAGNOSIS — R1011 Right upper quadrant pain: Secondary | ICD-10-CM

## 2024-08-11 ENCOUNTER — Ambulatory Visit: Payer: Self-pay | Admitting: Physician Assistant

## 2024-08-11 DIAGNOSIS — D734 Cyst of spleen: Secondary | ICD-10-CM

## 2024-08-11 NOTE — Progress Notes (Signed)
 No causes of right upper quadrant pain. Cyst of spleen needs MRI to get a better look at it.

## 2024-08-23 ENCOUNTER — Other Ambulatory Visit: Payer: Self-pay | Admitting: Physician Assistant

## 2024-08-23 ENCOUNTER — Ambulatory Visit: Payer: Self-pay | Admitting: Physician Assistant

## 2024-08-23 ENCOUNTER — Ambulatory Visit

## 2024-08-23 DIAGNOSIS — D734 Cyst of spleen: Secondary | ICD-10-CM

## 2024-08-23 DIAGNOSIS — J432 Centrilobular emphysema: Secondary | ICD-10-CM

## 2024-08-23 MED ORDER — GADOBUTROL 1 MMOL/ML IV SOLN
10.0000 mL | Freq: Once | INTRAVENOUS | Status: AC | PRN
  Administered 2024-08-23: 10 mL via INTRAVENOUS

## 2024-08-23 NOTE — Progress Notes (Signed)
 Follow up benign spleen cyst in 1 year

## 2024-09-02 ENCOUNTER — Other Ambulatory Visit: Payer: Self-pay | Admitting: Physician Assistant

## 2024-09-02 DIAGNOSIS — G894 Chronic pain syndrome: Secondary | ICD-10-CM

## 2024-09-03 ENCOUNTER — Ambulatory Visit: Payer: Self-pay

## 2024-09-03 ENCOUNTER — Ambulatory Visit: Admitting: Physician Assistant

## 2024-09-03 NOTE — Telephone Encounter (Signed)
 FYI Only or Action Required?: FYI only for provider: appointment scheduled on 12/8. Declines sooner appt.  Patient was last seen in primary care on 07/27/2024 by Antoniette Vermell CROME, PA-C.  Called Nurse Triage reporting Sinusitis and Migraine (/).  Symptoms began today. Has happened 3 times over the past several weeks  Interventions attempted: OTC medications: Claritin, tylenol , OTC sinus med, flonase  and Prescription medications: maxalt , topamax .  Symptoms are: gradually worsening.  Triage Disposition: See HCP Within 4 Hours (Or PCP Triage)  Patient/caregiver understands and will follow disposition?: No, refuses disposition    Copied from CRM #[id number]. Topic: Clinical - Red Word Triage >> Sep 03, 2024 12:36 PM Aimee Little wrote: Migraines, nose running,scratchy throat, pt experiences this with her sinuses when it gets god.Aimee Little She would like to schedule an appointment to have them checked as well as have her labs done to check her tyroid Reason for Disposition  [1] SEVERE sinus pain (e.g., excruciating) AND [2] not improved 2 hours after pain medicine  Answer Assessment - Initial Assessment Questions 10/10 sinus pain and headache and sinus congestion with runny nose x1 day. Has had 3 episodes over the past 3 weeks of current symptoms. Hx of migraines and sinus infections, happening with increased frequency this year. Denies numbness, tingling, weakness, increased difficulty walking or changes to speech or vision. Speaking in clear full coherent continuous sentences. Denies fever or SOB. Advised to be seen today, offered to schedule appt today, declines. Pt states she doesn't want to go anywhere today and would like to schedule Monday. Advised on risks of delayed care, pt verbalizes understanding. Appt schedule Monday with PCP. Advised UC or ED for worsening symptoms.    1. LOCATION: Where does it hurt?      Head and sinuses  2. ONSET: When did the sinus pain start?  (e.g., hours, days)       This morning  3. SEVERITY: How bad is the pain?   (Scale 0-10; or none, mild, moderate or severe)     10/10  4. RECURRENT SYMPTOM: Have you ever had sinus problems before? If Yes, ask: When was the last time? and What happened that time?      3 times this year  5. NASAL CONGESTION: Is the nose blocked? If Yes, ask: Can you open it or must you breathe through your mouth?     Stuffy, able to breathe through nose  6. NASAL DISCHARGE: Do you have discharge from your nose? If so ask, What color?     Runny nose clear  7. FEVER: Do you have a fever? If Yes, ask: What is it, how was it measured, and when did it start?      Denies  8. OTHER SYMPTOMS: Do you have any other symptoms? (e.g., sore throat, cough, earache, difficulty breathing)     Denies cough. Severe 10/10 headache, hx of migraine. Denies numbness, tingling, weakness, increased difficulty walking or changes to speech or vision. Speaking in clear full coherent continuous sentences.  Protocols used: Sinus Pain or Congestion-A-AH

## 2024-09-06 ENCOUNTER — Encounter: Payer: Self-pay | Admitting: Physician Assistant

## 2024-09-06 ENCOUNTER — Ambulatory Visit: Admitting: Physician Assistant

## 2024-09-06 ENCOUNTER — Other Ambulatory Visit: Payer: Self-pay | Admitting: Physician Assistant

## 2024-09-06 VITALS — BP 121/61 | HR 61 | Temp 98.6°F

## 2024-09-06 DIAGNOSIS — E039 Hypothyroidism, unspecified: Secondary | ICD-10-CM | POA: Diagnosis not present

## 2024-09-06 DIAGNOSIS — J0140 Acute pansinusitis, unspecified: Secondary | ICD-10-CM | POA: Diagnosis not present

## 2024-09-06 DIAGNOSIS — R6889 Other general symptoms and signs: Secondary | ICD-10-CM

## 2024-09-06 LAB — POCT INFLUENZA A/B
Influenza A, POC: NEGATIVE
Influenza B, POC: NEGATIVE

## 2024-09-06 MED ORDER — AZITHROMYCIN 250 MG PO TABS: ORAL_TABLET | ORAL | 0 refills | Status: AC

## 2024-09-06 NOTE — Progress Notes (Signed)
 Acute Office Visit  Subjective:     Patient ID: Aimee Little, female    DOB: 31-Oct-1960, 63 y.o.   MRN: [id number]  Chief Complaint  Patient presents with   Cough    HPI .SABRADiscussed the use of AI scribe software for clinical note transcription with the patient, who gave verbal consent to proceed.  History of Present Illness Aimee Little is a 63 year old female who presents with recurrent sinus flare-ups and migraines.  Recurrent sinus symptoms - Sinus flare-ups occurring weekly and daily for the past four weeks - Pain localized to the sinus areas and frontal head region - Associated nasal congestion and rhinorrhea during episodes - No unusual nasal discharge - No difficulty breathing - Congestion improves with CPAP machine use  Migraine headaches - Migraines occur concurrently with sinus flare-ups - Pain located in the front of the head  Medication and laboratory requests - Requests thyroid  function testing for hypothyroidism but labs have been more hyperthyroid.  - Needs prescription refill as current supply will run out this month    ROS See HPI.      Objective:    BP 121/61 (Cuff Size: Normal)   Pulse 61   Temp 98.6 F (37 C) (Oral)   SpO2 99%  BP Readings from Last 3 Encounters:  09/06/24 121/61  07/27/24 130/80  07/16/24 (!) 160/80   Wt Readings from Last 3 Encounters:  08/23/24 235 lb (106.6 kg)  04/05/24 237 lb (107.5 kg)  07/30/23 216 lb (98 kg)      Physical Exam Constitutional:      Appearance: Normal appearance. She is obese.  HENT:     Head: Normocephalic.     Comments: Tenderness to palpation over maxillary and frontal sinuses.     Right Ear: Tympanic membrane, ear canal and external ear normal. There is no impacted cerumen.     Left Ear: Tympanic membrane, ear canal and external ear normal. There is no impacted cerumen.     Nose: Congestion present.     Mouth/Throat:     Mouth: Mucous membranes are moist.     Pharynx: No  posterior oropharyngeal erythema.  Eyes:     Conjunctiva/sclera: Conjunctivae normal.  Cardiovascular:     Rate and Rhythm: Normal rate and regular rhythm.  Pulmonary:     Effort: Pulmonary effort is normal.     Breath sounds: Normal breath sounds.  Abdominal:     General: Bowel sounds are normal.  Musculoskeletal:     Cervical back: Normal range of motion and neck supple. No tenderness.     Right lower leg: No edema.     Left lower leg: No edema.  Lymphadenopathy:     Cervical: No cervical adenopathy.  Neurological:     General: No focal deficit present.     Mental Status: She is alert and oriented to person, place, and time.  Psychiatric:        Mood and Affect: Mood normal.     Results for orders placed or performed in visit on 09/06/24  POCT Influenza A/B  Result Value Ref Range   Influenza A, POC Negative Negative   Influenza B, POC Negative Negative        Assessment & Plan:  SABRASABRAMelissa was seen today for cough.  Diagnoses and all orders for this visit:  Acute non-recurrent pansinusitis -     azithromycin  (ZITHROMAX  Z-PAK) 250 MG tablet; Take 2 tablets (500 mg) on  Day 1,  followed  by 1 tablet (250 mg) once daily on Days 2 through 5.  Flu-like symptoms -     POCT Influenza A/B  Acquired hypothyroidism -     TSH + free T4 -     CMP14+EGFR   Assessment & Plan Chronic sinusitis with recurrent migraine Chronic sinusitis with migraines for four weeks, with nasal congestion, facial pain, and headaches. Negative for flu and COVID. Possible residual infection considered. - Prescribed Zpak - Continue flonase  - Follow up as needed if symptoms persist or worsen  Hypothyroidism Requires monitoring and prescription renewal. - Ordered thyroid  function tests. - Renewed thyroid  medication prescription.    Margree Gimbel, PA-C

## 2024-09-06 NOTE — Patient Instructions (Signed)

## 2024-09-07 ENCOUNTER — Encounter: Payer: Self-pay | Admitting: Physician Assistant

## 2024-09-07 ENCOUNTER — Ambulatory Visit: Payer: Self-pay | Admitting: Physician Assistant

## 2024-09-07 LAB — CMP14+EGFR
ALT: 24 IU/L (ref 0–32)
AST: 17 IU/L (ref 0–40)
Albumin: 4.2 g/dL (ref 3.9–4.9)
Alkaline Phosphatase: 90 IU/L (ref 49–135)
BUN/Creatinine Ratio: 23 (ref 12–28)
BUN: 19 mg/dL (ref 8–27)
Bilirubin Total: 0.4 mg/dL (ref 0.0–1.2)
CO2: 22 mmol/L (ref 20–29)
Calcium: 9.1 mg/dL (ref 8.7–10.3)
Chloride: 106 mmol/L (ref 96–106)
Creatinine, Ser: 0.82 mg/dL (ref 0.57–1.00)
Globulin, Total: 2.3 g/dL (ref 1.5–4.5)
Glucose: 94 mg/dL (ref 70–99)
Potassium: 3.6 mmol/L (ref 3.5–5.2)
Sodium: 144 mmol/L (ref 134–144)
Total Protein: 6.5 g/dL (ref 6.0–8.5)
eGFR: 80 mL/min/1.73 (ref >59)

## 2024-09-07 LAB — TSH+FREE T4
Free T4: 2.73 ng/dL — ABNORMAL HIGH (ref 0.82–1.77)
TSH: 0.011 u[IU]/mL — ABNORMAL LOW (ref 0.450–4.500)

## 2024-09-07 MED ORDER — METHYLPREDNISOLONE 4 MG PO TBPK: ORAL_TABLET | ORAL | 0 refills | Status: AC

## 2024-09-07 MED ORDER — LEVOTHYROXINE SODIUM 200 MCG PO TABS: 200.0000 ug | ORAL_TABLET | Freq: Every day | ORAL | 0 refills | Status: DC

## 2024-09-07 NOTE — Progress Notes (Signed)
 T4 levels are still too high. You should be at right now daily. I think reasonable to decrease down to 200mcg daily recheck in 4 weeks. Are you ok with this decrease?

## 2024-09-07 NOTE — Addendum Note (Signed)
 Addended by: ANTONIETTE VERMELL CROME on: 09/07/2024 11:48 AM   Modules accepted: Orders

## 2024-09-07 NOTE — Progress Notes (Signed)
 Patient called in and requested medrol  dose pack due to the sinus pressure and congestion.

## 2024-09-10 ENCOUNTER — Other Ambulatory Visit: Payer: Self-pay | Admitting: Physician Assistant

## 2024-09-10 DIAGNOSIS — F332 Major depressive disorder, recurrent severe without psychotic features: Secondary | ICD-10-CM

## 2024-09-14 ENCOUNTER — Other Ambulatory Visit: Payer: Self-pay | Admitting: Physician Assistant

## 2024-09-27 ENCOUNTER — Encounter: Payer: Self-pay | Admitting: Physician Assistant

## 2024-09-27 DIAGNOSIS — E039 Hypothyroidism, unspecified: Secondary | ICD-10-CM

## 2024-09-29 ENCOUNTER — Ambulatory Visit: Payer: Self-pay | Admitting: Medical-Surgical

## 2024-09-29 DIAGNOSIS — E039 Hypothyroidism, unspecified: Secondary | ICD-10-CM

## 2024-09-29 LAB — TSH+FREE T4
Free T4: 1.49 ng/dL (ref 0.82–1.77)
TSH: 0.011 u[IU]/mL — ABNORMAL LOW (ref 0.450–4.500)

## 2024-09-29 MED ORDER — LEVOTHYROXINE SODIUM 200 MCG PO TABS: ORAL_TABLET | ORAL | 0 refills | Status: DC

## 2024-10-07 ENCOUNTER — Other Ambulatory Visit: Payer: Self-pay | Admitting: Physician Assistant

## 2024-10-09 ENCOUNTER — Other Ambulatory Visit: Payer: Self-pay | Admitting: Physician Assistant

## 2024-10-09 DIAGNOSIS — G2581 Restless legs syndrome: Secondary | ICD-10-CM

## 2024-10-20 ENCOUNTER — Other Ambulatory Visit: Payer: Self-pay | Admitting: Physician Assistant

## 2024-10-23 ENCOUNTER — Other Ambulatory Visit: Payer: Self-pay | Admitting: Physician Assistant

## 2024-10-23 DIAGNOSIS — R1013 Epigastric pain: Secondary | ICD-10-CM

## 2024-10-28 ENCOUNTER — Other Ambulatory Visit: Payer: Self-pay | Admitting: Medical-Surgical

## 2024-10-28 NOTE — Telephone Encounter (Signed)
 Pended prescription. This dose has never been filled by [facility].

## 2024-10-28 NOTE — Telephone Encounter (Signed)
 Last labs 09/28/2024  Last filled 09/29/2024   Take 200mcg daily x 5 days per week then take 100mcg (1/2 tablet) daily the remaining 2 days per week.   Last OV 09/06/2024  The script needs updated with the correct dosage and qty.

## 2024-10-29 ENCOUNTER — Encounter: Payer: Self-pay | Admitting: Physician Assistant
